# Patient Record
Sex: Male | Born: 1950 | Race: White | Hispanic: No | Smoking: Current every day smoker
Health system: Southern US, Community
[De-identification: ages and names within clinical notes are randomized; demographics above are authoritative.]

## PROBLEM LIST (undated history)

## (undated) DIAGNOSIS — E119 Type 2 diabetes mellitus without complications: Secondary | ICD-10-CM

## (undated) DIAGNOSIS — J449 Chronic obstructive pulmonary disease, unspecified: Secondary | ICD-10-CM

## (undated) DIAGNOSIS — Z9981 Dependence on supplemental oxygen: Secondary | ICD-10-CM

## (undated) DIAGNOSIS — I2510 Atherosclerotic heart disease of native coronary artery without angina pectoris: Secondary | ICD-10-CM

## (undated) DIAGNOSIS — I509 Heart failure, unspecified: Secondary | ICD-10-CM

## (undated) DIAGNOSIS — D751 Secondary polycythemia: Secondary | ICD-10-CM

## (undated) DIAGNOSIS — R600 Localized edema: Secondary | ICD-10-CM

## (undated) DIAGNOSIS — I10 Essential (primary) hypertension: Secondary | ICD-10-CM

## (undated) HISTORY — DX: Localized edema: R60.0

## (undated) HISTORY — DX: Essential (primary) hypertension: I10

## (undated) HISTORY — DX: Secondary polycythemia: D75.1

## (undated) HISTORY — DX: Atherosclerotic heart disease of native coronary artery without angina pectoris: I25.10

## (undated) HISTORY — DX: Chronic obstructive pulmonary disease, unspecified: J44.9

---

## 2012-07-09 ENCOUNTER — Encounter (HOSPITAL_COMMUNITY): Payer: Self-pay

## 2012-07-19 ENCOUNTER — Ambulatory Visit (HOSPITAL_COMMUNITY)
Admission: RE | Admit: 2012-07-19 | Discharge: 2012-07-19 | Disposition: A | Discharge status: Home / Self Care | Payer: Self-pay | Source: Ambulatory Visit | Attending: Family Medicine | Admitting: Family Medicine

## 2012-07-19 DIAGNOSIS — R079 Chest pain, unspecified: Secondary | ICD-10-CM | POA: Insufficient documentation

## 2014-08-03 ENCOUNTER — Inpatient Hospital Stay (HOSPITAL_COMMUNITY)
Admission: EM | Admit: 2014-08-03 | Discharge: 2014-08-07 | DRG: 190 | Disposition: A | Discharge status: Home / Self Care | Payer: Medicare Other | Attending: Internal Medicine | Admitting: Internal Medicine

## 2014-08-03 ENCOUNTER — Inpatient Hospital Stay (HOSPITAL_COMMUNITY): Payer: Medicare Other

## 2014-08-03 ENCOUNTER — Emergency Department (HOSPITAL_COMMUNITY): Payer: Medicare Other

## 2014-08-03 ENCOUNTER — Other Ambulatory Visit (HOSPITAL_COMMUNITY): Payer: Self-pay

## 2014-08-03 ENCOUNTER — Encounter (HOSPITAL_COMMUNITY): Payer: Self-pay | Admitting: *Deleted

## 2014-08-03 DIAGNOSIS — I5031 Acute diastolic (congestive) heart failure: Secondary | ICD-10-CM | POA: Diagnosis present

## 2014-08-03 DIAGNOSIS — J441 Chronic obstructive pulmonary disease with (acute) exacerbation: Secondary | ICD-10-CM | POA: Diagnosis present

## 2014-08-03 DIAGNOSIS — R0600 Dyspnea, unspecified: Secondary | ICD-10-CM | POA: Diagnosis present

## 2014-08-03 DIAGNOSIS — F17210 Nicotine dependence, cigarettes, uncomplicated: Secondary | ICD-10-CM | POA: Diagnosis present

## 2014-08-03 DIAGNOSIS — R600 Localized edema: Secondary | ICD-10-CM | POA: Diagnosis present

## 2014-08-03 DIAGNOSIS — R0902 Hypoxemia: Secondary | ICD-10-CM | POA: Diagnosis present

## 2014-08-03 DIAGNOSIS — Z880 Allergy status to penicillin: Secondary | ICD-10-CM | POA: Diagnosis not present

## 2014-08-03 DIAGNOSIS — J9601 Acute respiratory failure with hypoxia: Secondary | ICD-10-CM | POA: Diagnosis present

## 2014-08-03 DIAGNOSIS — I10 Essential (primary) hypertension: Secondary | ICD-10-CM | POA: Diagnosis present

## 2014-08-03 DIAGNOSIS — J449 Chronic obstructive pulmonary disease, unspecified: Secondary | ICD-10-CM | POA: Diagnosis present

## 2014-08-03 DIAGNOSIS — R778 Other specified abnormalities of plasma proteins: Secondary | ICD-10-CM | POA: Diagnosis present

## 2014-08-03 DIAGNOSIS — D751 Secondary polycythemia: Secondary | ICD-10-CM | POA: Diagnosis present

## 2014-08-03 DIAGNOSIS — I248 Other forms of acute ischemic heart disease: Secondary | ICD-10-CM | POA: Diagnosis present

## 2014-08-03 DIAGNOSIS — R7989 Other specified abnormal findings of blood chemistry: Secondary | ICD-10-CM | POA: Diagnosis present

## 2014-08-03 DIAGNOSIS — Z881 Allergy status to other antibiotic agents status: Secondary | ICD-10-CM

## 2014-08-03 DIAGNOSIS — R0602 Shortness of breath: Secondary | ICD-10-CM | POA: Diagnosis present

## 2014-08-03 DIAGNOSIS — J9620 Acute and chronic respiratory failure, unspecified whether with hypoxia or hypercapnia: Secondary | ICD-10-CM | POA: Diagnosis present

## 2014-08-03 LAB — BASIC METABOLIC PANEL
Anion gap: 11 (ref 5–15)
BUN: 13 mg/dL (ref 6–23)
CO2: 36 mmol/L — AB (ref 19–32)
Calcium: 9.1 mg/dL (ref 8.4–10.5)
Chloride: 93 mmol/L — ABNORMAL LOW (ref 96–112)
Creatinine, Ser: 1.07 mg/dL (ref 0.50–1.35)
GFR calc Af Amer: 82 mL/min — ABNORMAL LOW (ref >90)
GFR, EST NON AFRICAN AMERICAN: 71 mL/min — AB (ref >90)
GLUCOSE: 99 mg/dL (ref 70–99)
POTASSIUM: 4.3 mmol/L (ref 3.5–5.1)
SODIUM: 140 mmol/L (ref 135–145)

## 2014-08-03 LAB — BLOOD GAS, ARTERIAL
Acid-Base Excess: 11 mmol/L — ABNORMAL HIGH (ref 0.0–2.0)
BICARBONATE: 36.9 meq/L — AB (ref 20.0–24.0)
Drawn by: 331001
O2 Content: 4 L/min
O2 Saturation: 86 %
PATIENT TEMPERATURE: 37
PH ART: 7.352 (ref 7.350–7.450)
PO2 ART: 52.3 mmHg — AB (ref 80.0–100.0)
TCO2: 30.3 mmol/L (ref 0–100)
pCO2 arterial: 68.2 mmHg (ref 35.0–45.0)

## 2014-08-03 LAB — I-STAT CHEM 8, ED
BUN: 14 mg/dL (ref 6–23)
Calcium, Ion: 1.03 mmol/L — ABNORMAL LOW (ref 1.13–1.30)
Chloride: 91 mmol/L — ABNORMAL LOW (ref 96–112)
Creatinine, Ser: 1.1 mg/dL (ref 0.50–1.35)
GLUCOSE: 106 mg/dL — AB (ref 70–99)
HEMATOCRIT: 67 % — AB (ref 39.0–52.0)
HEMOGLOBIN: 22.8 g/dL — AB (ref 13.0–17.0)
POTASSIUM: 4.1 mmol/L (ref 3.5–5.1)
SODIUM: 139 mmol/L (ref 135–145)
TCO2: 34 mmol/L (ref 0–100)

## 2014-08-03 LAB — CBC WITH DIFFERENTIAL/PLATELET
BASOS ABS: 0.1 10*3/uL (ref 0.0–0.1)
BASOS PCT: 1 % (ref 0–1)
Band Neutrophils: 0 % (ref 0–10)
Blasts: 0 %
EOS ABS: 0.2 10*3/uL (ref 0.0–0.7)
EOS PCT: 2 % (ref 0–5)
HEMATOCRIT: 62.6 % — AB (ref 39.0–52.0)
HEMOGLOBIN: 20.5 g/dL — AB (ref 13.0–17.0)
Lymphocytes Relative: 29 % (ref 12–46)
Lymphs Abs: 2.3 10*3/uL (ref 0.7–4.0)
MCH: 30.7 pg (ref 26.0–34.0)
MCHC: 32.7 g/dL (ref 30.0–36.0)
MCV: 93.7 fL (ref 78.0–100.0)
METAMYELOCYTES PCT: 0 %
MONO ABS: 0.9 10*3/uL (ref 0.1–1.0)
MYELOCYTES: 0 %
Monocytes Relative: 12 % (ref 3–12)
Neutro Abs: 4.4 10*3/uL (ref 1.7–7.7)
Neutrophils Relative %: 56 % (ref 43–77)
Platelets: 135 10*3/uL — ABNORMAL LOW (ref 150–400)
Promyelocytes Absolute: 0 %
RBC: 6.68 MIL/uL — AB (ref 4.22–5.81)
RDW: 13.5 % (ref 11.5–15.5)
WBC: 7.9 10*3/uL (ref 4.0–10.5)
nRBC: 0 /100 WBC

## 2014-08-03 LAB — TSH: TSH: 1.098 u[IU]/mL (ref 0.350–4.500)

## 2014-08-03 LAB — HEPATIC FUNCTION PANEL
ALBUMIN: 4.1 g/dL (ref 3.5–5.2)
ALK PHOS: 115 U/L (ref 39–117)
ALT: 20 U/L (ref 0–53)
AST: 23 U/L (ref 0–37)
BILIRUBIN DIRECT: 0.2 mg/dL (ref 0.0–0.5)
BILIRUBIN TOTAL: 0.7 mg/dL (ref 0.3–1.2)
Indirect Bilirubin: 0.5 mg/dL (ref 0.3–0.9)
Total Protein: 7.4 g/dL (ref 6.0–8.3)

## 2014-08-03 LAB — MAGNESIUM: MAGNESIUM: 1.9 mg/dL (ref 1.5–2.5)

## 2014-08-03 LAB — RETICULOCYTES
RBC.: 6.61 MIL/uL — ABNORMAL HIGH (ref 4.22–5.81)
RETIC COUNT ABSOLUTE: 112.4 10*3/uL (ref 19.0–186.0)
Retic Ct Pct: 1.7 % (ref 0.4–3.1)

## 2014-08-03 LAB — BRAIN NATRIURETIC PEPTIDE: B NATRIURETIC PEPTIDE 5: 59 pg/mL (ref 0.0–100.0)

## 2014-08-03 LAB — TROPONIN I
TROPONIN I: 0.04 ng/mL — AB (ref <0.031)
Troponin I: 0.04 ng/mL — ABNORMAL HIGH (ref <0.031)

## 2014-08-03 LAB — PHOSPHORUS: Phosphorus: 3.1 mg/dL (ref 2.3–4.6)

## 2014-08-03 MED ORDER — ACETAMINOPHEN 325 MG PO TABS: 650.0000 mg | ORAL_TABLET | Freq: Four times a day (QID) | ORAL | Status: DC | PRN

## 2014-08-03 MED ORDER — ALBUTEROL SULFATE (2.5 MG/3ML) 0.083% IN NEBU
2.5000 mg | INHALATION_SOLUTION | Freq: Once | RESPIRATORY_TRACT | Status: AC
  Administered 2014-08-03: 2.5 mg via RESPIRATORY_TRACT
  Filled 2014-08-03: qty 3

## 2014-08-03 MED ORDER — ONDANSETRON HCL 4 MG PO TABS: 4.0000 mg | ORAL_TABLET | Freq: Four times a day (QID) | ORAL | Status: DC | PRN

## 2014-08-03 MED ORDER — HYDRALAZINE HCL 20 MG/ML IJ SOLN: 5.0000 mg | INTRAMUSCULAR | Status: DC | PRN

## 2014-08-03 MED ORDER — IPRATROPIUM-ALBUTEROL 0.5-2.5 (3) MG/3ML IN SOLN
3.0000 mL | RESPIRATORY_TRACT | Status: DC
  Administered 2014-08-03 – 2014-08-06 (×20): 3 mL via RESPIRATORY_TRACT
  Filled 2014-08-03 (×21): qty 3

## 2014-08-03 MED ORDER — IPRATROPIUM-ALBUTEROL 0.5-2.5 (3) MG/3ML IN SOLN
3.0000 mL | Freq: Once | RESPIRATORY_TRACT | Status: AC
  Administered 2014-08-03: 3 mL via RESPIRATORY_TRACT
  Filled 2014-08-03: qty 3

## 2014-08-03 MED ORDER — HEPARIN SODIUM (PORCINE) 5000 UNIT/ML IJ SOLN
5000.0000 [IU] | Freq: Three times a day (TID) | INTRAMUSCULAR | Status: DC
  Administered 2014-08-03 – 2014-08-07 (×12): 5000 [IU] via SUBCUTANEOUS
  Filled 2014-08-03 (×12): qty 1

## 2014-08-03 MED ORDER — LISINOPRIL 10 MG PO TABS
20.0000 mg | ORAL_TABLET | Freq: Every day | ORAL | Status: DC
  Administered 2014-08-04 – 2014-08-07 (×4): 20 mg via ORAL
  Filled 2014-08-03 (×4): qty 2

## 2014-08-03 MED ORDER — OXYCODONE HCL 5 MG PO TABS
5.0000 mg | ORAL_TABLET | ORAL | Status: DC | PRN
  Administered 2014-08-06: 5 mg via ORAL
  Filled 2014-08-03: qty 1

## 2014-08-03 MED ORDER — ACETAMINOPHEN 650 MG RE SUPP: 650.0000 mg | Freq: Four times a day (QID) | RECTAL | Status: DC | PRN

## 2014-08-03 MED ORDER — LISINOPRIL-HYDROCHLOROTHIAZIDE 20-12.5 MG PO TABS: 1.0000 | ORAL_TABLET | Freq: Every day | ORAL | Status: DC

## 2014-08-03 MED ORDER — FUROSEMIDE 10 MG/ML IJ SOLN
40.0000 mg | Freq: Two times a day (BID) | INTRAMUSCULAR | Status: DC
  Administered 2014-08-03 – 2014-08-04 (×3): 40 mg via INTRAVENOUS
  Filled 2014-08-03 (×3): qty 4

## 2014-08-03 MED ORDER — ONDANSETRON HCL 4 MG/2ML IJ SOLN: 4.0000 mg | Freq: Four times a day (QID) | INTRAMUSCULAR | Status: DC | PRN

## 2014-08-03 MED ORDER — HYDROCHLOROTHIAZIDE 12.5 MG PO CAPS
12.5000 mg | ORAL_CAPSULE | Freq: Every day | ORAL | Status: DC
  Administered 2014-08-04 – 2014-08-07 (×4): 12.5 mg via ORAL
  Filled 2014-08-03 (×5): qty 1

## 2014-08-03 MED ORDER — METHYLPREDNISOLONE SODIUM SUCC 125 MG IJ SOLR
60.0000 mg | Freq: Four times a day (QID) | INTRAMUSCULAR | Status: DC
  Administered 2014-08-03 – 2014-08-05 (×8): 60 mg via INTRAVENOUS
  Filled 2014-08-03 (×8): qty 2

## 2014-08-03 MED ORDER — SODIUM CHLORIDE 0.9 % IJ SOLN
3.0000 mL | Freq: Two times a day (BID) | INTRAMUSCULAR | Status: DC
  Administered 2014-08-03 – 2014-08-07 (×8): 3 mL via INTRAVENOUS

## 2014-08-03 NOTE — ED Notes (Signed)
RT called

## 2014-08-03 NOTE — ED Notes (Signed)
CRITICAL VALUE ALERT  Critical value received:  ABG  Date of notification:  08/03/14  Time of notification:  16:37  Critical value read back:Yes.    Nurse who received alert:  Catha Brow, RN  MD notified (1st page):  Dr. Marily Memos  Time of first page:  16:38  MD notified (2nd page):  Time of second page:  Responding MD:  Dr. Marily Memos  Time MD responded:  16:38

## 2014-08-03 NOTE — H&P (Signed)
[facility] History and Physical  Brian Parsons YQI:[id number] DOB: 1950/07/07 DOA: 08/03/2014  Referring physician: Dr Roderic Palau - [facility] PCP: No primary care provider on file.   Chief Complaint: SOB  HPI: Brian Parsons is a 64 y.o. male  SOB started 2 wks ago.  Went to PCP today and was told to go to the ED for evaluation due to concern for fluid in the lungs.  No home O2. Unable to sleep lying down over past 2 weeks. SOB is worse w/ exertion. Associated w/ productive cough, and wheezing.  Ran out of Lisinopril/HCTZ 6 wks ago. Restarted 10 days ago after changing PCPs. New Doctor is Dr. Marlon Pel.  Review of Systems:  Constitutional:  No weight loss, night sweats, Fevers, chills, fatigue.  HEENT:  No headaches, Difficulty swallowing,Tooth/dental problems,Sore throat,  No sneezing, itching, ear ache, nasal congestion, post nasal drip,  Cardio-vascular:  No chest pain, Orthopnea, PND, anasarca, dizziness, palpitations  GI:  No heartburn, indigestion, abdominal pain, nausea, vomiting, diarrhea, change in bowel habits, loss of appetite  Resp: Per HPI Skin:  no rash or lesions.  GU:  no dysuria, change in color of urine, no urgency or frequency. No flank pain.  Musculoskeletal:   No joint pain or swelling. No decreased range of motion. No back pain.  Psych:  No change in mood or affect. No depression or anxiety. No memory loss.   Past Medical History  Diagnosis Date  . Hypertension    History reviewed. No pertinent past surgical history. Social History:  reports that he has been smoking Cigarettes.  He has a 50 pack-year smoking history. He does not have any smokeless tobacco history on file. He reports that he does not drink alcohol or use illicit drugs.  Allergies  Allergen Reactions  . Amoxicillin   . Penicillins     Family History  Problem Relation Age of Onset  . Stroke Mother      Prior to Admission medications   Not on File   Physical Exam: Filed Vitals:     08/03/14 1430 08/03/14 1500 08/03/14 1530 08/03/14 1545  BP: 132/89 145/89 150/109 138/102  Pulse: 84 83 84 93  Temp:      TempSrc:      Resp: 27 28    Height:      Weight:      SpO2: 93% 90% 93% 86%    Wt Readings from Last 3 Encounters:  08/03/14 106.595 kg (235 lb)    General:  Appears calm and comfortable Eyes:  PERRL, normal lids, injected conjunctiva bilat ENT:  grossly normal hearing, lips & tongue Neck:  No JVD, FROM Cardiovascular:  RRR, no m/r/g. 2+ pitting edema Respiratory: Diffuse wheezes and crackles with diminished sounds in the bases. Mildly increased work of breathing. On 2 L nasal cannula. Abdomen:  soft, ntnd, obese Skin:  no rash or induration seen on limited exam Musculoskeletal:  grossly normal tone BUE/BLE Psychiatric:  grossly normal mood and affect, speech fluent and appropriate Neurologic:  grossly non-focal.          Labs on Admission:  Basic Metabolic Panel:  Recent Labs Lab 08/03/14 1300 08/03/14 1552  NA 140 139  K 4.3 4.1  CL 93* 91*  CO2 36*  --   GLUCOSE 99 106*  BUN 13 14  CREATININE 1.07 1.10  CALCIUM 9.1  --    Liver Function Tests:  Recent Labs Lab 08/03/14 1300  AST 23  ALT 20  ALKPHOS 115  BILITOT 0.7  PROT 7.4  ALBUMIN 4.1   No results for input(s): LIPASE, AMYLASE in the last 168 hours. No results for input(s): AMMONIA in the last 168 hours. CBC:  Recent Labs Lab 08/03/14 1300 08/03/14 1552  WBC 7.9  --   NEUTROABS 4.4  --   HGB 20.5* 22.8*  HCT 62.6* 67.0*  MCV 93.7  --   PLT 135*  --    Cardiac Enzymes:  Recent Labs Lab 08/03/14 1300  TROPONINI 0.04*    BNP (last 3 results)  Recent Labs  08/03/14 1300  BNP 59.0    ProBNP (last 3 results) No results for input(s): PROBNP in the last 8760 hours.  CBG: No results for input(s): GLUCAP in the last 168 hours.  Radiological Exams on Admission: Dg Chest Portable 1 View  08/03/2014   CLINICAL DATA:  64 year old male with shortness of  breath and lower extremity edema for the past 2 weeks.  EXAM: PORTABLE CHEST - 1 VIEW  COMPARISON:  None.  FINDINGS: Mild cardiomegaly. There is pulmonary vascular congestion with cephalization. No overt pulmonary edema. Nonspecific bibasilar opacities. No pneumothorax. No acute osseous abnormality.  IMPRESSION: 1. Nonspecific bibasilar opacities greater on the right than the left. Differential considerations include dependent interstitial edema, atelectasis and infiltrate. Recommend dedicated PA and lateral chest x-ray when the patient is able. 2. Cardiomegaly and pulmonary vascular congestion without overt edema.   Electronically Signed   By: Jacqulynn Cadet M.D.   On: 08/03/2014 13:34    EKG: Independently reviewed. Sinus, PACs, no sign of ACS.  Assessment/Plan Principal Problem:   Hypoxia Active Problems:   COPD (chronic obstructive pulmonary disease)   Lower extremity edema   Elevated troponin   Polycythemia   Essential hypertension   Dyspnea   SHortness of breath: Likely multifactorial, associated with COPD exacerbation and heart failure. Patient with no formal COPD diagnoses but has a 50+ pack year smoking history, and physical exam is consistent with this. BNP only 59 but elevated troponin, orthopnea, and diffuse edema concerning for CHF. A likely infectious given lack of white count and chronicity of symptoms and unlikely PE given lack of chest pain and bilateral edema. - Tele - Solu-Medrol 60 mg every 6 hours - Duonebs every 4 hours - O2 when necessary - Formal PFTs after discharge - Echo with contrast - Lasix 40 mg IV - Strict I's and O's - Daily weights - 2 view CXR - Mag, TSH - ABG  Hypertension: Hypertensive on presentation. This may be due to patient's acute illness. - Continue HCTZ and lisinopril - Hydralazine when necessary SBP> 180 and the DBP > 100  Elevated troponin: 0.04 and admission likely secondary to demand. EKG without direct evidence of ischemia -  Telemetry - Cycle troponins - EKG in a.m.  Polycythemia: Hgb 20.5 HCT 62.6. Repeat labs confirmed elevation. Diff unremarkable. WBC 7.9. Likely from chronic hypoxemia vs malignancy - Peripheral smear - Epo - Retic - Consider H/O consult   Code Status: FULL DVT Prophylaxis: Hep Family Communication: Son Disposition Plan: pending improvement  Ayelet Gruenewald Lenna Sciara, MD Family Medicine [facility] [email or web address] Password [email or web address]

## 2014-08-03 NOTE — ED Notes (Signed)
Patient transferred off of the floor and will go by xray before going to his room.

## 2014-08-03 NOTE — ED Notes (Signed)
Sent from [facility]  For "retaining fld"  abd swollen  And swelling of lower ext.   No NVD,  Has had cough with green sputum.

## 2014-08-03 NOTE — ED Notes (Signed)
Pt's admission orders were released by hospitalist, xray here to get pt. For a repeat xray and RT here for ABG. Floor notified on pt. Delay.

## 2014-08-03 NOTE — ED Provider Notes (Signed)
CSN: [id number]     Arrival date & time 08/03/14  1227 History   First MD Initiated Contact with Patient 08/03/14 1305     Chief Complaint  Patient presents with  . Shortness of Breath     (Consider location/radiation/quality/duration/timing/severity/associated sxs/prior Treatment) Patient is a 64 y.o. male presenting with shortness of breath. The history is provided by the patient (the pt complains of sob).  Shortness of Breath Severity:  Moderate Onset quality:  Gradual Timing:  Constant Progression:  Worsening Chronicity:  New Context: activity   Relieved by:  Nothing Associated symptoms: no abdominal pain, no chest pain, no cough, no headaches and no rash     Past Medical History  Diagnosis Date  . Hypertension    History reviewed. No pertinent past surgical history. History reviewed. No pertinent family history. History  Substance Use Topics  . Smoking status: Current Every Day Smoker  . Smokeless tobacco: Not on file  . Alcohol Use: No     Comment: no etoh for 7 years    Review of Systems  Constitutional: Negative for appetite change and fatigue.  HENT: Negative for congestion, ear discharge and sinus pressure.   Eyes: Negative for discharge.  Respiratory: Positive for shortness of breath. Negative for cough.   Cardiovascular: Negative for chest pain.  Gastrointestinal: Negative for abdominal pain and diarrhea.  Genitourinary: Negative for frequency and hematuria.  Musculoskeletal: Negative for back pain.  Skin: Negative for rash.  Neurological: Negative for seizures and headaches.  Psychiatric/Behavioral: Negative for hallucinations.      Allergies  Amoxicillin and Penicillins  Home Medications   Prior to Admission medications   Not on File   BP 145/89 mmHg  Pulse 83  Temp(Src) 98.8 F (37.1 C) (Oral)  Resp 28  Ht 5\' 8"  (1.727 m)  Wt 235 lb (106.595 kg)  BMI 35.74 kg/m2  SpO2 90% Physical Exam  Constitutional: He is oriented to person,  place, and time. He appears well-developed.  HENT:  Head: Normocephalic.  Eyes: Conjunctivae and EOM are normal. No scleral icterus.  Neck: Neck supple. No thyromegaly present.  Cardiovascular: Normal rate and regular rhythm.  Exam reveals no gallop and no friction rub.   No murmur heard. Pulmonary/Chest: No stridor. He has wheezes. He has no rales. He exhibits no tenderness.  Abdominal: He exhibits no distension. There is no tenderness. There is no rebound.  Musculoskeletal: Normal range of motion. He exhibits edema.  Lymphadenopathy:    He has no cervical adenopathy.  Neurological: He is oriented to person, place, and time. He exhibits normal muscle tone. Coordination normal.  Skin: No rash noted. No erythema.  Psychiatric: He has a normal mood and affect. His behavior is normal.    ED Course  Procedures (including critical care time) Labs Review Labs Reviewed  CBC WITH DIFFERENTIAL/PLATELET - Abnormal; Notable for the following:    RBC 6.68 (*)    Hemoglobin 20.5 (*)    HCT 62.6 (*)    Platelets 135 (*)    All other components within normal limits  BASIC METABOLIC PANEL - Abnormal; Notable for the following:    Chloride 93 (*)    CO2 36 (*)    GFR calc non Af Amer 71 (*)    GFR calc Af Amer 82 (*)    All other components within normal limits  TROPONIN I - Abnormal; Notable for the following:    Troponin I 0.04 (*)    All other components within normal limits  HEPATIC FUNCTION PANEL  BRAIN NATRIURETIC PEPTIDE    Imaging Review Dg Chest Portable 1 View  08/03/2014   CLINICAL DATA:  64 year old male with shortness of breath and lower extremity edema for the past 2 weeks.  EXAM: PORTABLE CHEST - 1 VIEW  COMPARISON:  None.  FINDINGS: Mild cardiomegaly. There is pulmonary vascular congestion with cephalization. No overt pulmonary edema. Nonspecific bibasilar opacities. No pneumothorax. No acute osseous abnormality.  IMPRESSION: 1. Nonspecific bibasilar opacities greater on the  right than the left. Differential considerations include dependent interstitial edema, atelectasis and infiltrate. Recommend dedicated PA and lateral chest x-ray when the patient is able. 2. Cardiomegaly and pulmonary vascular congestion without overt edema.   Electronically Signed   By: Jacqulynn Cadet M.D.   On: 08/03/2014 13:34     EKG Interpretation None      MDM   Final diagnoses:  COPD exacerbation    Admit for copd+    Milton Ferguson, MD 08/03/14 1529

## 2014-08-04 DIAGNOSIS — J9620 Acute and chronic respiratory failure, unspecified whether with hypoxia or hypercapnia: Secondary | ICD-10-CM | POA: Diagnosis present

## 2014-08-04 DIAGNOSIS — I5031 Acute diastolic (congestive) heart failure: Secondary | ICD-10-CM | POA: Diagnosis present

## 2014-08-04 DIAGNOSIS — J441 Chronic obstructive pulmonary disease with (acute) exacerbation: Principal | ICD-10-CM

## 2014-08-04 DIAGNOSIS — R0600 Dyspnea, unspecified: Secondary | ICD-10-CM

## 2014-08-04 LAB — CBC
HCT: 61.2 % — ABNORMAL HIGH (ref 39.0–52.0)
HEMOGLOBIN: 19.6 g/dL — AB (ref 13.0–17.0)
MCH: 30.3 pg (ref 26.0–34.0)
MCHC: 32 g/dL (ref 30.0–36.0)
MCV: 94.7 fL (ref 78.0–100.0)
Platelets: 137 10*3/uL — ABNORMAL LOW (ref 150–400)
RBC: 6.46 MIL/uL — AB (ref 4.22–5.81)
RDW: 13.6 % (ref 11.5–15.5)
WBC: 5.7 10*3/uL (ref 4.0–10.5)

## 2014-08-04 LAB — COMPREHENSIVE METABOLIC PANEL
ALK PHOS: 102 U/L (ref 39–117)
ALT: 20 U/L (ref 0–53)
ANION GAP: 10 (ref 5–15)
AST: 19 U/L (ref 0–37)
Albumin: 3.9 g/dL (ref 3.5–5.2)
BILIRUBIN TOTAL: 0.9 mg/dL (ref 0.3–1.2)
BUN: 20 mg/dL (ref 6–23)
CHLORIDE: 92 mmol/L — AB (ref 96–112)
CO2: 37 mmol/L — AB (ref 19–32)
Calcium: 8.8 mg/dL (ref 8.4–10.5)
Creatinine, Ser: 1.16 mg/dL (ref 0.50–1.35)
GFR calc Af Amer: 75 mL/min — ABNORMAL LOW (ref >90)
GFR, EST NON AFRICAN AMERICAN: 64 mL/min — AB (ref >90)
GLUCOSE: 138 mg/dL — AB (ref 70–99)
POTASSIUM: 4.8 mmol/L (ref 3.5–5.1)
SODIUM: 139 mmol/L (ref 135–145)
Total Protein: 7.3 g/dL (ref 6.0–8.3)

## 2014-08-04 LAB — TROPONIN I

## 2014-08-04 LAB — ERYTHROPOIETIN: ERYTHROPOIETIN: 35.6 m[IU]/mL — AB (ref 2.6–18.5)

## 2014-08-04 MED ORDER — LEVOFLOXACIN 500 MG PO TABS
500.0000 mg | ORAL_TABLET | ORAL | Status: DC
  Administered 2014-08-04 – 2014-08-06 (×3): 500 mg via ORAL
  Filled 2014-08-04 (×3): qty 1

## 2014-08-04 MED ORDER — NICOTINE 21 MG/24HR TD PT24
21.0000 mg | MEDICATED_PATCH | Freq: Every day | TRANSDERMAL | Status: DC
  Administered 2014-08-04 – 2014-08-07 (×4): 21 mg via TRANSDERMAL
  Filled 2014-08-04 (×4): qty 1

## 2014-08-04 MED ORDER — ALBUTEROL SULFATE (2.5 MG/3ML) 0.083% IN NEBU: 2.5000 mg | INHALATION_SOLUTION | RESPIRATORY_TRACT | Status: DC | PRN

## 2014-08-04 NOTE — Progress Notes (Signed)
  Echocardiogram 2D Echocardiogram has been performed.  Brian Parsons 08/04/2014, 9:28 AM

## 2014-08-04 NOTE — Progress Notes (Signed)
UR chart review completed.  

## 2014-08-04 NOTE — Progress Notes (Signed)
[facility] PROGRESS NOTE  Brian Parsons NKN:[id number] DOB: [birth year] DOA: 08/03/2014 PCP: No primary care provider on file.  Assessment/Plan: 1. Acute respiratory failure. Felt to be multifactorial related to COPD as well as CHF. Continue to wean down oxygen as tolerated. 2. COPD exacerbation. Patient has a long history of tobacco use. He will likely need PFTs as an outpatient. Continue on steroids, bronchodilators and antibiotics. 3. Acute diastolic congestive heart failure. Improving with intravenous Lasix. Patient reports his lower extremity edema is improving as is his shortness of breath. His volume status is -2.34 L. Continue current treatments. Creatinine is stable 4. Polycythemia. Likely secondary to chronic hypoxia from tobacco abuse. Can consider outpatient hematology referral. 5. Elevated troponin. Likely demand ischemia. He is not having any chest pain or EKG changes. 6. Tobacco use. Counseled on the importance of tobacco cessation. Provide nicotine patch.  Code Status: Full code Family Communication: Discussed with family at the bedside Disposition Plan: Discharge home once improved   Consultants:    Procedures:  Echo:- Left ventricle: The cavity size was normal. Wall thickness was increased in a pattern of mild LVH. Systolic function was normal. The estimated ejection fraction was in the range of 60% to 65%. Wall motion was normal; there were no regional wall motion abnormalities. Left ventricular diastolic function parameters were normal. - Aortic valve: Mildly calcified annulus. Mildly thickened leaflets. Valve area (VTI): 3.42 cm^2. Valve area (Vmax): 3.39 cm^2. - Right ventricle: The cavity size was moderately dilated. The RV shares the apex with the LV. - Right atrium: The atrium was mildly dilated. - Atrial septum: No defect or patent foramen ovale was identified. - Systemic veins: IVC is dilated with normal respiratory  variation, estimated RA pressure is 8 mmHg.  Antibiotics:  Levaquin 4/19>>  HPI/Subjective: Feeling better, still short of breath, LE edema improving  Objective: Filed Vitals:   08/04/14 0625  BP: 131/86  Pulse: 81  Temp: 98 F (36.7 C)  Resp:     Intake/Output Summary (Last 24 hours) at 08/04/14 1103 Last data filed at 08/04/14 0629  Gross per 24 hour  Intake    360 ml  Output   1850 ml  Net  -1490 ml   Filed Weights   08/03/14 1240 08/03/14 1654 08/04/14 0625  Weight: 106.595 kg (235 lb) 106.68 kg (235 lb 3 oz) 103.828 kg (228 lb 14.4 oz)    Exam:   General:  NAD  Cardiovascular: s1, s2, rrr  Respiratory: bilateral wheezing, diminished breath sounds  Abdomen: soft, obese, nt, bs+  Musculoskeletal: 1+ edema b/l   Data Reviewed: Basic Metabolic Panel:  Recent Labs Lab 08/03/14 1300 08/03/14 1552 08/03/14 1611 08/04/14 0608  NA 140 139  --  139  K 4.3 4.1  --  4.8  CL 93* 91*  --  92*  CO2 36*  --   --  37*  GLUCOSE 99 106*  --  138*  BUN 13 14  --  20  CREATININE 1.07 1.10  --  1.16  CALCIUM 9.1  --   --  8.8  MG  --   --  1.9  --   PHOS  --   --  3.1  --    Liver Function Tests:  Recent Labs Lab 08/03/14 1300 08/04/14 0608  AST 23 19  ALT 20 20  ALKPHOS 115 102  BILITOT 0.7 0.9  PROT 7.4 7.3  ALBUMIN 4.1 3.9   No results for input(s): LIPASE, AMYLASE in the last 168  hours. No results for input(s): AMMONIA in the last 168 hours. CBC:  Recent Labs Lab 08/03/14 1300 08/03/14 1552 08/04/14 0608  WBC 7.9  --  5.7  NEUTROABS 4.4  --   --   HGB 20.5* 22.8* 19.6*  HCT 62.6* 67.0* 61.2*  MCV 93.7  --  94.7  PLT 135*  --  137*   Cardiac Enzymes:  Recent Labs Lab 08/03/14 1300 08/03/14 1801 08/04/14 0020  TROPONINI 0.04* 0.04* <0.03   BNP (last 3 results)  Recent Labs  08/03/14 1300  BNP 59.0    ProBNP (last 3 results) No results for input(s): PROBNP in the last 8760 hours.  CBG: No results for input(s): GLUCAP  in the last 168 hours.  No results found for this or any previous visit (from the past 240 hour(s)).   Studies: Dg Chest 2 View  08/03/2014   CLINICAL DATA:  Cough.  Retaining fluid.  EXAM: CHEST  2 VIEW  COMPARISON:  08/03/2014 1300 hours  FINDINGS: Lungs are better aerated. Bibasilar atelectasis has improved. Heart remains borderline enlarged. Normal vascularity. No pneumothorax. Upper lumbar compression deformity is present with 50% loss of height anteriorly.  IMPRESSION: Improved bibasilar atelectasis.  Upper lumbar compression fracture.   Electronically Signed   By: Marybelle Killings M.D.   On: 08/03/2014 16:48   Dg Chest Portable 1 View  08/03/2014   CLINICAL DATA:  64 year old male with shortness of breath and lower extremity edema for the past 2 weeks.  EXAM: PORTABLE CHEST - 1 VIEW  COMPARISON:  None.  FINDINGS: Mild cardiomegaly. There is pulmonary vascular congestion with cephalization. No overt pulmonary edema. Nonspecific bibasilar opacities. No pneumothorax. No acute osseous abnormality.  IMPRESSION: 1. Nonspecific bibasilar opacities greater on the right than the left. Differential considerations include dependent interstitial edema, atelectasis and infiltrate. Recommend dedicated PA and lateral chest x-ray when the patient is able. 2. Cardiomegaly and pulmonary vascular congestion without overt edema.   Electronically Signed   By: Jacqulynn Cadet M.D.   On: 08/03/2014 13:34    Scheduled Meds: . furosemide  40 mg Intravenous BID  . heparin  5,000 Units Subcutaneous 3 times per day  . lisinopril  20 mg Oral Daily   And  . hydrochlorothiazide  12.5 mg Oral Daily  . ipratropium-albuterol  3 mL Nebulization Q4H  . methylPREDNISolone (SOLU-MEDROL) injection  60 mg Intravenous Q6H  . sodium chloride  3 mL Intravenous Q12H   Continuous Infusions:   Principal Problem:   Hypoxia Active Problems:   COPD (chronic obstructive pulmonary disease)   Lower extremity edema   Elevated  troponin   Polycythemia   Essential hypertension   Dyspnea    Time spent: 22mins    MEMON,JEHANZEB  [facility] Pager [phone]. If 7PM-7AM, please contact night-coverage at [email or web address], password [email or web address] 08/04/2014, 11:03 AM  LOS: 1 day

## 2014-08-04 NOTE — Care Management Note (Addendum)
    Page 1 of 2   08/07/2014     9:58:01 AM CARE MANAGEMENT NOTE 08/07/2014  Patient:  Brian Parsons, Brian Parsons   Account Number:  [id number]  Date Initiated:  08/04/2014  Documentation initiated by:  Theophilus Kinds  Subjective/Objective Assessment:   Pt admitted from home with COPD exacerbation. Pt lives with his significant other and will return home at discharge. Pt is independent with ADL's. Pt does have neb machine for home use.     Action/Plan:   Will need to assess need for home O2 prior to discharge.   Anticipated DC Date:  08/06/2014   Anticipated DC Plan:  Brian Parsons  CM consult      [facility] Choice  HOME HEALTH  DURABLE MEDICAL EQUIPMENT   Choice offered to / List presented to:  C-1 Patient   DME arranged  [facility]      DME agency  [place] arranged  HH-1 RN  [place].   Status of service:  Completed, signed off Medicare Important Message given?  YES (If response is "NO", the following Medicare IM given date fields will be blank) Date Medicare IM given:  08/07/2014 Medicare IM given by:  Theophilus Kinds Date Additional Medicare IM given:   Additional Medicare IM given by:    Discharge Disposition:  [place]  Per UR Regulation:    If discussed at Long Length of Stay Meetings, dates discussed:    Comments:  08/07/14 [place], RN BSN CM Pt discharged home today with [facility] RN (per pts choice). Brian Parsons of [facility] is aware and will collect the pts information from the chart. [place] services to start within 48 hours of discharge. Pt qualifies for home O2 and would like to get from [facility] as well. Portable to be delivered to pts room prior to discharge and other DME will be delivered to pts home at discharge. Pt and pts nurse aware of discharge arrangements.  08/04/14 [place], Therapist, sports BSN CM

## 2014-08-05 ENCOUNTER — Inpatient Hospital Stay (HOSPITAL_COMMUNITY): Payer: Medicare Other

## 2014-08-05 DIAGNOSIS — J9601 Acute respiratory failure with hypoxia: Secondary | ICD-10-CM

## 2014-08-05 LAB — BASIC METABOLIC PANEL
Anion gap: 10 (ref 5–15)
BUN: 31 mg/dL — ABNORMAL HIGH (ref 6–23)
CALCIUM: 8.5 mg/dL (ref 8.4–10.5)
CHLORIDE: 89 mmol/L — AB (ref 96–112)
CO2: 37 mmol/L — ABNORMAL HIGH (ref 19–32)
Creatinine, Ser: 1.19 mg/dL (ref 0.50–1.35)
GFR, EST AFRICAN AMERICAN: 72 mL/min — AB (ref >90)
GFR, EST NON AFRICAN AMERICAN: 62 mL/min — AB (ref >90)
GLUCOSE: 140 mg/dL — AB (ref 70–99)
Potassium: 4.7 mmol/L (ref 3.5–5.1)
Sodium: 136 mmol/L (ref 135–145)

## 2014-08-05 LAB — CBC
HCT: 60.2 % — ABNORMAL HIGH (ref 39.0–52.0)
Hemoglobin: 18.9 g/dL — ABNORMAL HIGH (ref 13.0–17.0)
MCH: 29.3 pg (ref 26.0–34.0)
MCHC: 31.4 g/dL (ref 30.0–36.0)
MCV: 93.2 fL (ref 78.0–100.0)
Platelets: 165 K/uL (ref 150–400)
RBC: 6.46 MIL/uL — ABNORMAL HIGH (ref 4.22–5.81)
RDW: 14.8 % (ref 11.5–15.5)
WBC: 11.9 K/uL — ABNORMAL HIGH (ref 4.0–10.5)

## 2014-08-05 MED ORDER — ALPRAZOLAM 0.25 MG PO TABS
0.2500 mg | ORAL_TABLET | Freq: Three times a day (TID) | ORAL | Status: DC | PRN
  Administered 2014-08-05 – 2014-08-07 (×3): 0.25 mg via ORAL
  Filled 2014-08-05 (×3): qty 1

## 2014-08-05 MED ORDER — GUAIFENESIN ER 600 MG PO TB12
1200.0000 mg | ORAL_TABLET | Freq: Two times a day (BID) | ORAL | Status: DC
Start: 2014-08-05 — End: 2014-08-07
  Administered 2014-08-05 – 2014-08-07 (×5): 1200 mg via ORAL
  Filled 2014-08-05 (×5): qty 2

## 2014-08-05 MED ORDER — IOHEXOL 350 MG/ML SOLN
100.0000 mL | Freq: Once | INTRAVENOUS | Status: AC | PRN
  Administered 2014-08-05: 100 mL via INTRAVENOUS

## 2014-08-05 MED ORDER — METHYLPREDNISOLONE SODIUM SUCC 125 MG IJ SOLR
80.0000 mg | Freq: Four times a day (QID) | INTRAMUSCULAR | Status: DC
  Administered 2014-08-05 – 2014-08-07 (×8): 80 mg via INTRAVENOUS
  Filled 2014-08-05 (×7): qty 2

## 2014-08-05 MED ORDER — FUROSEMIDE 20 MG PO TABS
20.0000 mg | ORAL_TABLET | Freq: Every day | ORAL | Status: DC
  Administered 2014-08-06 – 2014-08-07 (×2): 20 mg via ORAL
  Filled 2014-08-05 (×2): qty 1

## 2014-08-05 NOTE — Progress Notes (Signed)
[facility] PROGRESS NOTE  Adrienne Delay CHY:[id number] DOB: Jan 18, 1951 DOA: 08/03/2014 PCP: No primary care provider on file.  Assessment/Plan: 1. Acute respiratory failure. Felt to be multifactorial related to COPD as well as CHF. Patient has an increasing oxygen requirement overnight. CT chest was done today which did not show any PE. Will continue current treatments 2. COPD exacerbation. Patient has a long history of tobacco use. He will likely need PFTs as an outpatient. He is still wheezing and hypoxic. Will continue nebs and abx. Increase steroids.  3. Acute diastolic congestive heart failure. Improving with intravenous Lasix. Patient reports his lower extremity edema is improving as is his shortness of breath. His volume status is -2.7 L. Appears to be approaching euvolemia. Will transition to oral lasix 4. Polycythemia. Likely secondary to chronic hypoxia from tobacco abuse. Can consider outpatient hematology referral. 5. Elevated troponin. Likely demand ischemia. He is not having any chest pain or EKG changes. 6. Tobacco use. Counseled on the importance of tobacco cessation. Provide nicotine patch.  Code Status: Full code Family Communication: Discussed with family at the bedside Disposition Plan: Discharge home once improved   Consultants:    Procedures:  Echo:- Left ventricle: The cavity size was normal. Wall thickness was increased in a pattern of mild LVH. Systolic function was normal. The estimated ejection fraction was in the range of 60% to 65%. Wall motion was normal; there were no regional wall motion abnormalities. Left ventricular diastolic function parameters were normal. - Aortic valve: Mildly calcified annulus. Mildly thickened leaflets. Valve area (VTI): 3.42 cm^2. Valve area (Vmax): 3.39 cm^2. - Right ventricle: The cavity size was moderately dilated. The RV shares the apex with the LV. - Right atrium: The atrium was mildly dilated. -  Atrial septum: No defect or patent foramen ovale was identified. - Systemic veins: IVC is dilated with normal respiratory variation, estimated RA pressure is 8 mmHg.  Antibiotics:  Levaquin 4/19>>  HPI/Subjective: Although patient is saying that he is feeling better, he has had an increasing oxygen requirement overnight. Now on 4.5L. Reports minimal cough that is non productive. LE edema has improved.  Objective: Filed Vitals:   08/05/14 0550  BP: 114/71  Pulse: 99  Temp: 98.4 F (36.9 C)  Resp: 20    Intake/Output Summary (Last 24 hours) at 08/05/14 0910 Last data filed at 08/05/14 0549  Gross per 24 hour  Intake    720 ml  Output   1800 ml  Net  -1080 ml   Filed Weights   08/03/14 1654 08/04/14 0625 08/05/14 0550  Weight: 106.68 kg (235 lb 3 oz) 103.828 kg (228 lb 14.4 oz) 104.4 kg (230 lb 2.6 oz)    Exam:   General:  NAD  Cardiovascular: s1, s2, rrr  Respiratory: bilateral wheezing improving, diminished breath sounds  Abdomen: soft, obese, nt, bs+  Musculoskeletal: no edema b/l   Data Reviewed: Basic Metabolic Panel:  Recent Labs Lab 08/03/14 1300 08/03/14 1552 08/03/14 1611 08/04/14 0608 08/05/14 0609  NA 140 139  --  139 136  K 4.3 4.1  --  4.8 4.7  CL 93* 91*  --  92* 89*  CO2 36*  --   --  37* 37*  GLUCOSE 99 106*  --  138* 140*  BUN 13 14  --  20 31*  CREATININE 1.07 1.10  --  1.16 1.19  CALCIUM 9.1  --   --  8.8 8.5  MG  --   --  1.9  --   --  PHOS  --   --  3.1  --   --    Liver Function Tests:  Recent Labs Lab 08/03/14 1300 08/04/14 0608  AST 23 19  ALT 20 20  ALKPHOS 115 102  BILITOT 0.7 0.9  PROT 7.4 7.3  ALBUMIN 4.1 3.9   No results for input(s): LIPASE, AMYLASE in the last 168 hours. No results for input(s): AMMONIA in the last 168 hours. CBC:  Recent Labs Lab 08/03/14 1300 08/03/14 1552 08/04/14 0608 08/05/14 0609  WBC 7.9  --  5.7 11.9*  NEUTROABS 4.4  --   --   --   HGB 20.5* 22.8* 19.6* 18.9*  HCT 62.6*  67.0* 61.2* 60.2*  MCV 93.7  --  94.7 93.2  PLT 135*  --  137* 165   Cardiac Enzymes:  Recent Labs Lab 08/03/14 1300 08/03/14 1801 08/04/14 0020  TROPONINI 0.04* 0.04* <0.03   BNP (last 3 results)  Recent Labs  08/03/14 1300  BNP 59.0    ProBNP (last 3 results) No results for input(s): PROBNP in the last 8760 hours.  CBG: No results for input(s): GLUCAP in the last 168 hours.  No results found for this or any previous visit (from the past 240 hour(s)).   Studies: Dg Chest 2 View  08/03/2014   CLINICAL DATA:  Cough.  Retaining fluid.  EXAM: CHEST  2 VIEW  COMPARISON:  08/03/2014 1300 hours  FINDINGS: Lungs are better aerated. Bibasilar atelectasis has improved. Heart remains borderline enlarged. Normal vascularity. No pneumothorax. Upper lumbar compression deformity is present with 50% loss of height anteriorly.  IMPRESSION: Improved bibasilar atelectasis.  Upper lumbar compression fracture.   Electronically Signed   By: Marybelle Killings M.D.   On: 08/03/2014 16:48   Dg Chest Portable 1 View  08/03/2014   CLINICAL DATA:  64 year old male with shortness of breath and lower extremity edema for the past 2 weeks.  EXAM: PORTABLE CHEST - 1 VIEW  COMPARISON:  None.  FINDINGS: Mild cardiomegaly. There is pulmonary vascular congestion with cephalization. No overt pulmonary edema. Nonspecific bibasilar opacities. No pneumothorax. No acute osseous abnormality.  IMPRESSION: 1. Nonspecific bibasilar opacities greater on the right than the left. Differential considerations include dependent interstitial edema, atelectasis and infiltrate. Recommend dedicated PA and lateral chest x-ray when the patient is able. 2. Cardiomegaly and pulmonary vascular congestion without overt edema.   Electronically Signed   By: Jacqulynn Cadet M.D.   On: 08/03/2014 13:34    Scheduled Meds: . [START ON 08/06/2014] furosemide  20 mg Oral Daily  . heparin  5,000 Units Subcutaneous 3 times per day  . lisinopril  20  mg Oral Daily   And  . hydrochlorothiazide  12.5 mg Oral Daily  . ipratropium-albuterol  3 mL Nebulization Q4H  . levofloxacin  500 mg Oral Q24H  . methylPREDNISolone (SOLU-MEDROL) injection  60 mg Intravenous Q6H  . nicotine  21 mg Transdermal Daily  . sodium chloride  3 mL Intravenous Q12H   Continuous Infusions:   Principal Problem:   Hypoxia Active Problems:   COPD (chronic obstructive pulmonary disease)   Lower extremity edema   Elevated troponin   Polycythemia   Essential hypertension   Dyspnea   Acute respiratory failure with hypoxia   Acute diastolic congestive heart failure    Time spent: 21mins    Elspeth Blucher  [facility] Pager [phone]. If 7PM-7AM, please contact night-coverage at [email or web address], password [facility] 08/05/2014, 9:10 AM  LOS: 2 days

## 2014-08-05 NOTE — Progress Notes (Signed)
Spoke with patient and educated him on the importance of smoking cessation. Patient was provided a handout and I reviewed it with the patient at bedside. Encouraged patient to ask questions and patient stated he had no questions at this time. Will continue to encourage patient on smoking cessation at this time.

## 2014-08-05 NOTE — Progress Notes (Signed)
RT instructed patient on use of flutter , Pt tolerated well. RT will continue to monitor no complications noted

## 2014-08-06 LAB — BASIC METABOLIC PANEL
Anion gap: 7 (ref 5–15)
BUN: 37 mg/dL — AB (ref 6–23)
CALCIUM: 8.4 mg/dL (ref 8.4–10.5)
CHLORIDE: 90 mmol/L — AB (ref 96–112)
CO2: 39 mmol/L — AB (ref 19–32)
Creatinine, Ser: 1.25 mg/dL (ref 0.50–1.35)
GFR calc Af Amer: 68 mL/min — ABNORMAL LOW (ref >90)
GFR calc non Af Amer: 59 mL/min — ABNORMAL LOW (ref >90)
Glucose, Bld: 136 mg/dL — ABNORMAL HIGH (ref 70–99)
POTASSIUM: 4.8 mmol/L (ref 3.5–5.1)
Sodium: 136 mmol/L (ref 135–145)

## 2014-08-06 MED ORDER — IPRATROPIUM-ALBUTEROL 0.5-2.5 (3) MG/3ML IN SOLN
3.0000 mL | RESPIRATORY_TRACT | Status: DC
  Administered 2014-08-07 (×2): 3 mL via RESPIRATORY_TRACT
  Filled 2014-08-06 (×2): qty 3

## 2014-08-06 NOTE — Progress Notes (Addendum)
Patient asleep Snoring, no treatment given Patient requested to go back to sleep

## 2014-08-06 NOTE — Progress Notes (Signed)
[facility] PROGRESS NOTE  Brian Parsons PJA:[id number] DOB: 07-30-1950 DOA: 08/03/2014 PCP: No primary care provider on file.  Assessment/Plan: 1. Acute respiratory failure. Felt to be multifactorial related to COPD as well as CHF. CT chest was done which did not show any PE. Will continue current treatments. Continue to wean off oxygen as tolerated 2. COPD exacerbation. Patient has a long history of tobacco use. He will likely need PFTs as an outpatient. Slow improvement. Will continue nebs and abx, steroids.  3. Acute diastolic congestive heart failure. Improving with intravenous Lasix. Patient reports his lower extremity edema is improving as is his shortness of breath. His volume status is -2.7 L. Appears to be approaching euvolemia. On oral lasix 4. Polycythemia. Likely secondary to chronic hypoxia from tobacco abuse. Referred for outpatient hematology referral. 5. Elevated troponin. Likely demand ischemia. He is not having any chest pain or EKG changes. 6. Tobacco use. Counseled on the importance of tobacco cessation. Provide nicotine patch.  Code Status: Full code Family Communication: Discussed with family at the bedside Disposition Plan: Discharge home once improved   Consultants:    Procedures:  Echo:- Left ventricle: The cavity size was normal. Wall thickness was increased in a pattern of mild LVH. Systolic function was normal. The estimated ejection fraction was in the range of 60% to 65%. Wall motion was normal; there were no regional wall motion abnormalities. Left ventricular diastolic function parameters were normal. - Aortic valve: Mildly calcified annulus. Mildly thickened leaflets. Valve area (VTI): 3.42 cm^2. Valve area (Vmax): 3.39 cm^2. - Right ventricle: The cavity size was moderately dilated. The RV shares the apex with the LV. - Right atrium: The atrium was mildly dilated. - Atrial septum: No defect or patent foramen ovale was  identified. - Systemic veins: IVC is dilated with normal respiratory variation, estimated RA pressure is 8 mmHg.  Antibiotics:  Levaquin 4/19>>  HPI/Subjective: Feels that he is improving, cough is becoming more productive  Objective: Filed Vitals:   08/06/14 0622  BP: 127/90  Pulse: 89  Temp: 97.7 F (36.5 C)  Resp: 22    Intake/Output Summary (Last 24 hours) at 08/06/14 0932 Last data filed at 08/06/14 0857  Gross per 24 hour  Intake    726 ml  Output   1600 ml  Net   -874 ml   Filed Weights   08/04/14 0625 08/05/14 0550 08/06/14 0622  Weight: 103.828 kg (228 lb 14.4 oz) 104.4 kg (230 lb 2.6 oz) 104.962 kg (231 lb 6.4 oz)    Exam:   General:  NAD  Cardiovascular: s1, s2, rrr  Respiratory: improving air movement bilaterally with rhonchi  Abdomen: soft, obese, nt, bs+  Musculoskeletal: no edema b/l   Data Reviewed: Basic Metabolic Panel:  Recent Labs Lab 08/03/14 1300 08/03/14 1552 08/03/14 1611 08/04/14 0608 08/05/14 0609 08/06/14 0530  NA 140 139  --  139 136 136  K 4.3 4.1  --  4.8 4.7 4.8  CL 93* 91*  --  92* 89* 90*  CO2 36*  --   --  37* 37* 39*  GLUCOSE 99 106*  --  138* 140* 136*  BUN 13 14  --  20 31* 37*  CREATININE 1.07 1.10  --  1.16 1.19 1.25  CALCIUM 9.1  --   --  8.8 8.5 8.4  MG  --   --  1.9  --   --   --   PHOS  --   --  3.1  --   --   --  Liver Function Tests:  Recent Labs Lab 08/03/14 1300 08/04/14 0608  AST 23 19  ALT 20 20  ALKPHOS 115 102  BILITOT 0.7 0.9  PROT 7.4 7.3  ALBUMIN 4.1 3.9   No results for input(s): LIPASE, AMYLASE in the last 168 hours. No results for input(s): AMMONIA in the last 168 hours. CBC:  Recent Labs Lab 08/03/14 1300 08/03/14 1552 08/04/14 0608 08/05/14 0609  WBC 7.9  --  5.7 11.9*  NEUTROABS 4.4  --   --   --   HGB 20.5* 22.8* 19.6* 18.9*  HCT 62.6* 67.0* 61.2* 60.2*  MCV 93.7  --  94.7 93.2  PLT 135*  --  137* 165   Cardiac Enzymes:  Recent Labs Lab 08/03/14 1300  08/03/14 1801 08/04/14 0020  TROPONINI 0.04* 0.04* <0.03   BNP (last 3 results)  Recent Labs  08/03/14 1300  BNP 59.0    ProBNP (last 3 results) No results for input(s): PROBNP in the last 8760 hours.  CBG: No results for input(s): GLUCAP in the last 168 hours.  No results found for this or any previous visit (from the past 240 hour(s)).   Studies: Ct Angio Chest Pe W/cm &/or Wo Cm  08/05/2014   CLINICAL DATA:  Hypoxia, chest pain, shortness breath x2 weeks  EXAM: CT ANGIOGRAPHY CHEST WITH CONTRAST  TECHNIQUE: Multidetector CT imaging of the chest was performed using the standard protocol during bolus administration of intravenous contrast. Multiplanar CT image reconstructions and MIPs were obtained to evaluate the vascular anatomy.  CONTRAST:  119mL OMNIPAQUE IOHEXOL 350 MG/ML SOLN  COMPARISON:  None.  FINDINGS: Left arm contrast injection. The SVC is patent. There is mild four-chamber cardiac enlargement. Central pulmonary arteries are dilated. Satisfactory opacification of pulmonary arteries noted, and there is no evidence of pulmonary emboli. Patient motion degrades some of the images through the lung bases. Patent superior and inferior pulmonary veins bilaterally. Adequate contrast opacification of the thoracic aorta with no evidence of dissection, aneurysm, or stenosis. There is classic 3-vessel brachiocephalic arch anatomy without proximal stenosis. No significant atheromatous plaque.  No pleural or pericardial effusion. Subcentimeter prevascular and precarinal lymph nodes. No hilar adenopathy. Mild subsegmental atelectasis posteriorly at both lung bases. Mild emphysematous changes in the lung apices. Peribronchial thickening most marked in the right lower lobe. Lungs otherwise clear. Thoracic spine and sternum intact.  Visualized portions of upper abdomen unremarkable.  Review of the MIP images confirms the above findings.  IMPRESSION: 1. Negative for acute PE or thoracic aortic  dissection. 2. Emphysema and peribronchial thickening suggesting bronchitis.   Electronically Signed   By: Lucrezia Europe M.D.   On: 08/05/2014 10:44    Scheduled Meds: . furosemide  20 mg Oral Daily  . guaiFENesin  1,200 mg Oral BID  . heparin  5,000 Units Subcutaneous 3 times per day  . lisinopril  20 mg Oral Daily   And  . hydrochlorothiazide  12.5 mg Oral Daily  . ipratropium-albuterol  3 mL Nebulization Q4H  . levofloxacin  500 mg Oral Q24H  . methylPREDNISolone (SOLU-MEDROL) injection  80 mg Intravenous Q6H  . nicotine  21 mg Transdermal Daily  . sodium chloride  3 mL Intravenous Q12H   Continuous Infusions:   Principal Problem:   Hypoxia Active Problems:   COPD (chronic obstructive pulmonary disease)   Lower extremity edema   Elevated troponin   Polycythemia   Essential hypertension   Dyspnea   Acute respiratory failure with hypoxia   Acute diastolic  congestive heart failure    Time spent: 18mins    Brian Parsons  [facility] Pager [phone]. If 7PM-7AM, please contact night-coverage at [email or web address], password [facility] 08/06/2014, 9:32 AM  LOS: 3 days

## 2014-08-07 DIAGNOSIS — J441 Chronic obstructive pulmonary disease with (acute) exacerbation: Secondary | ICD-10-CM | POA: Insufficient documentation

## 2014-08-07 MED ORDER — IPRATROPIUM-ALBUTEROL 0.5-2.5 (3) MG/3ML IN SOLN: 3.0000 mL | Freq: Four times a day (QID) | RESPIRATORY_TRACT | Status: DC

## 2014-08-07 MED ORDER — LISINOPRIL 20 MG PO TABS: 10.0000 mg | ORAL_TABLET | Freq: Every day | ORAL | Status: DC

## 2014-08-07 MED ORDER — PREDNISONE 10 MG PO TABS: ORAL_TABLET | ORAL | Status: DC

## 2014-08-07 MED ORDER — GUAIFENESIN ER 600 MG PO TB12: 600.0000 mg | ORAL_TABLET | Freq: Two times a day (BID) | ORAL | Status: DC

## 2014-08-07 MED ORDER — NICOTINE 21 MG/24HR TD PT24: 21.0000 mg | MEDICATED_PATCH | Freq: Every day | TRANSDERMAL | Status: DC

## 2014-08-07 MED ORDER — HYDROXYZINE HCL 25 MG PO TABS: 25.0000 mg | ORAL_TABLET | Freq: Three times a day (TID) | ORAL | Status: DC | PRN

## 2014-08-07 MED ORDER — FLUTICASONE-SALMETEROL 250-50 MCG/DOSE IN AEPB: 1.0000 | INHALATION_SPRAY | Freq: Two times a day (BID) | RESPIRATORY_TRACT | Status: DC

## 2014-08-07 MED ORDER — FUROSEMIDE 20 MG PO TABS: 20.0000 mg | ORAL_TABLET | Freq: Every day | ORAL | Status: DC

## 2014-08-07 MED ORDER — LEVOFLOXACIN 500 MG PO TABS: 500.0000 mg | ORAL_TABLET | ORAL | Status: DC

## 2014-08-07 NOTE — Discharge Summary (Signed)
Physician Discharge Summary  Brian Parsons KDX:[id number] DOB: 01-08-1951 DOA: 08/03/2014  PCP: No primary care provider on file.  Admit date: 08/03/2014 Discharge date: 08/07/2014  Time spent: 45 minutes  Recommendations for Outpatient Follow-up:  1. Patient was discharged home with 3L oxygen 2. Follow up with primary care physician in 1-2 weeks 3. HH RN was arranged prior to discharge 4. Outpatient hematology consult has been arranged for evaluation of polycythemia  Discharge Diagnoses:  Principal Problem:   Hypoxia Active Problems:   COPD (chronic obstructive pulmonary disease)   Lower extremity edema   Elevated troponin   Polycythemia   Essential hypertension   Dyspnea   Acute respiratory failure with hypoxia   Acute diastolic congestive heart failure   COPD exacerbation   Discharge Condition: improving  Diet recommendation: low salt  Filed Weights   08/05/14 0550 08/06/14 0622 08/07/14 0530  Weight: 104.4 kg (230 lb 2.6 oz) 104.962 kg (231 lb 6.4 oz) 108.138 kg (238 lb 6.4 oz)    History of present illness:  This patient presents to the hospital with progressive shortness of breath that began approximately 2 weeks prior to admission. He had associated productive cough and wheezing. He was seen by his primary care physician and sent to the emergency room for evaluation. He was found to have a COPD exacerbation.  Hospital Course:  On admission, patient was found to be in acute hypoxic respiratory failure. This was felt to be multifactorial related to COPD exacerbation and some element of CHF. He was initially started on intravenous Lasix and had good diuresis. Once he achieves euvolemic, he was transitioned to oral Lasix. Echocardiogram showed normal EF. History with high-dose steroids, antibiotics and bronchodilators for COPD exacerbation. His improvement was slow. Patient is very insistent on being discharged home today. He is breathing comfortably on 3 L. This will likely  need to be weaned off over the next 1-2 weeks. He is in place on a prednisone taper. He no longer has any wheezes and appears to be approaching his baseline. He was strongly advised to abstain from any further smoking. He was incidentally noted to have polycythemia which is likely secondary to his chronic hypoxia from smoking. In any case, he's been referred to hematology for further workup as an outpatient.  Procedures:  Echo:- Left ventricle: The cavity size was normal. Wall thickness was increased in a pattern of mild LVH. Systolic function was normal. The estimated ejection fraction was in the range of 60% to 65%. Wall motion was normal; there were no regional wall motion abnormalities. Left ventricular diastolic function parameters were normal. - Aortic valve: Mildly calcified annulus. Mildly thickened leaflets. Valve area (VTI): 3.42 cm^2. Valve area (Vmax): 3.39 cm^2. - Right ventricle: The cavity size was moderately dilated. The RV shares the apex with the LV. - Right atrium: The atrium was mildly dilated. - Atrial septum: No defect or patent foramen ovale was identified. - Systemic veins: IVC is dilated with normal respiratory variation, estimated RA pressure is 8 mmHg. - Technically adequate study.  Consultations:    Discharge Exam: Filed Vitals:   08/07/14 0900  BP: 134/80  Pulse: 110  Temp: 97.9 F (36.6 C)  Resp: 20    General: NAD Cardiovascular: s1,s2 rrr Respiratory: diminished breath sounds bilaterally  Discharge Instructions   Discharge Instructions    Diet - low sodium heart healthy    Complete by:  As directed      Face-to-face encounter (required for Medicare/Medicaid patients)    Complete  by:  As directed   I Mohit Zirbes certify that this patient is under my care and that I, or a nurse practitioner or physician's assistant working with me, had a face-to-face encounter that meets the physician face-to-face encounter requirements  with this patient on 08/07/2014. The encounter with the patient was in whole, or in part for the following medical condition(s) which is the primary reason for home health care (List medical condition): copd exacerbation  The encounter with the patient was in whole, or in part, for the following medical condition, which is the primary reason for home health care:  copd exacerbation  I certify that, based on my findings, the following services are medically necessary home health services:  Nursing  Reason for Medically Necessary Home Health Services:  Skilled Nursing- Skilled Assessment/Observation  My clinical findings support the need for the above services:  Shortness of breath with activity  Further, I certify that my clinical findings support that this patient is homebound due to:  Shortness of Breath with activity     Home Health    Complete by:  As directed   To provide the following care/treatments:  RN     Increase activity slowly    Complete by:  As directed           Discharge Medication List as of 08/07/2014 11:59 AM    START taking these medications   Details  Fluticasone-Salmeterol (ADVAIR DISKUS) 250-50 MCG/DOSE AEPB Inhale 1 puff into the lungs 2 (two) times daily., Starting 08/07/2014, Until Discontinued, Print    furosemide (LASIX) 20 MG tablet Take 1 tablet (20 mg total) by mouth daily., Starting 08/07/2014, Until Discontinued, Print    guaiFENesin (MUCINEX) 600 MG 12 hr tablet Take 1 tablet (600 mg total) by mouth 2 (two) times daily., Starting 08/07/2014, Until Discontinued, Print    ipratropium-albuterol (DUONEB) 0.5-2.5 (3) MG/3ML SOLN Take 3 mLs by nebulization every 6 (six) hours., Starting 08/07/2014, Until Discontinued, Print    levofloxacin (LEVAQUIN) 500 MG tablet Take 1 tablet (500 mg total) by mouth daily., Starting 08/07/2014, Until Discontinued, Print    lisinopril (PRINIVIL,ZESTRIL) 20 MG tablet Take 0.5 tablets (10 mg total) by mouth daily., Starting 08/07/2014,  Until Discontinued, Print    nicotine (NICODERM CQ - DOSED IN MG/24 HOURS) 21 mg/24hr patch Place 1 patch (21 mg total) onto the skin daily., Starting 08/07/2014, Until Discontinued, Print    predniSONE (DELTASONE) 10 MG tablet Take 60mg  po daily for 2 days then 40mg  po daily for 2 days then 30mg  po daily for 2 days then 20mg  po daily for 2 days then 10mg  po daily for 2 days then stop, Print      CONTINUE these medications which have NOT CHANGED   Details  Magnesium Hydroxide (MAGNESIA PO) Take 1 tablet by mouth daily., Until Discontinued, Historical Med      STOP taking these medications     lisinopril-hydrochlorothiazide (PRINZIDE,ZESTORETIC) 20-12.5 MG per tablet        Allergies  Allergen Reactions  . Amoxicillin Hives  . Penicillins Hives   Follow-up Information    Follow up with Molli Hazard, MD. Go on 08/19/2014.   Specialties:  Hematology and Oncology, Oncology   Why:  @ 2:00PM   Contact information:   [address] [place] [address] [phone]        The results of significant diagnostics from this hospitalization (including imaging, microbiology, ancillary and laboratory) are listed below for reference.    Significant Diagnostic Studies:  Dg Chest 2 View  08/03/2014   CLINICAL DATA:  Cough.  Retaining fluid.  EXAM: CHEST  2 VIEW  COMPARISON:  08/03/2014 1300 hours  FINDINGS: Lungs are better aerated. Bibasilar atelectasis has improved. Heart remains borderline enlarged. Normal vascularity. No pneumothorax. Upper lumbar compression deformity is present with 50% loss of height anteriorly.  IMPRESSION: Improved bibasilar atelectasis.  Upper lumbar compression fracture.   Electronically Signed   By: Marybelle Killings M.D.   On: 08/03/2014 16:48   Ct Angio Chest Pe W/cm &/or Wo Cm  08/05/2014   CLINICAL DATA:  Hypoxia, chest pain, shortness breath x2 weeks  EXAM: CT ANGIOGRAPHY CHEST WITH CONTRAST  TECHNIQUE: Multidetector CT imaging of the chest was performed  using the standard protocol during bolus administration of intravenous contrast. Multiplanar CT image reconstructions and MIPs were obtained to evaluate the vascular anatomy.  CONTRAST:  137mL OMNIPAQUE IOHEXOL 350 MG/ML SOLN  COMPARISON:  None.  FINDINGS: Left arm contrast injection. The SVC is patent. There is mild four-chamber cardiac enlargement. Central pulmonary arteries are dilated. Satisfactory opacification of pulmonary arteries noted, and there is no evidence of pulmonary emboli. Patient motion degrades some of the images through the lung bases. Patent superior and inferior pulmonary veins bilaterally. Adequate contrast opacification of the thoracic aorta with no evidence of dissection, aneurysm, or stenosis. There is classic 3-vessel brachiocephalic arch anatomy without proximal stenosis. No significant atheromatous plaque.  No pleural or pericardial effusion. Subcentimeter prevascular and precarinal lymph nodes. No hilar adenopathy. Mild subsegmental atelectasis posteriorly at both lung bases. Mild emphysematous changes in the lung apices. Peribronchial thickening most marked in the right lower lobe. Lungs otherwise clear. Thoracic spine and sternum intact.  Visualized portions of upper abdomen unremarkable.  Review of the MIP images confirms the above findings.  IMPRESSION: 1. Negative for acute PE or thoracic aortic dissection. 2. Emphysema and peribronchial thickening suggesting bronchitis.   Electronically Signed   By: Lucrezia Europe M.D.   On: 08/05/2014 10:44   Dg Chest Portable 1 View  08/03/2014   CLINICAL DATA:  64 year old male with shortness of breath and lower extremity edema for the past 2 weeks.  EXAM: PORTABLE CHEST - 1 VIEW  COMPARISON:  None.  FINDINGS: Mild cardiomegaly. There is pulmonary vascular congestion with cephalization. No overt pulmonary edema. Nonspecific bibasilar opacities. No pneumothorax. No acute osseous abnormality.  IMPRESSION: 1. Nonspecific bibasilar opacities greater  on the right than the left. Differential considerations include dependent interstitial edema, atelectasis and infiltrate. Recommend dedicated PA and lateral chest x-ray when the patient is able. 2. Cardiomegaly and pulmonary vascular congestion without overt edema.   Electronically Signed   By: Jacqulynn Cadet M.D.   On: 08/03/2014 13:34    Microbiology: No results found for this or any previous visit (from the past 240 hour(s)).   Labs: Basic Metabolic Panel:  Recent Labs Lab 08/03/14 1300 08/03/14 1552 08/03/14 1611 08/04/14 0608 08/05/14 0609 08/06/14 0530  NA 140 139  --  139 136 136  K 4.3 4.1  --  4.8 4.7 4.8  CL 93* 91*  --  92* 89* 90*  CO2 36*  --   --  37* 37* 39*  GLUCOSE 99 106*  --  138* 140* 136*  BUN 13 14  --  20 31* 37*  CREATININE 1.07 1.10  --  1.16 1.19 1.25  CALCIUM 9.1  --   --  8.8 8.5 8.4  MG  --   --  1.9  --   --   --  PHOS  --   --  3.1  --   --   --    Liver Function Tests:  Recent Labs Lab 08/03/14 1300 08/04/14 0608  AST 23 19  ALT 20 20  ALKPHOS 115 102  BILITOT 0.7 0.9  PROT 7.4 7.3  ALBUMIN 4.1 3.9   No results for input(s): LIPASE, AMYLASE in the last 168 hours. No results for input(s): AMMONIA in the last 168 hours. CBC:  Recent Labs Lab 08/03/14 1300 08/03/14 1552 08/04/14 0608 08/05/14 0609  WBC 7.9  --  5.7 11.9*  NEUTROABS 4.4  --   --   --   HGB 20.5* 22.8* 19.6* 18.9*  HCT 62.6* 67.0* 61.2* 60.2*  MCV 93.7  --  94.7 93.2  PLT 135*  --  137* 165   Cardiac Enzymes:  Recent Labs Lab 08/03/14 1300 08/03/14 1801 08/04/14 0020  TROPONINI 0.04* 0.04* <0.03   BNP: BNP (last 3 results)  Recent Labs  08/03/14 1300  BNP 59.0    ProBNP (last 3 results) No results for input(s): PROBNP in the last 8760 hours.  CBG: No results for input(s): GLUCAP in the last 168 hours.     Signed:  Jaydon Parsons  [facility] 08/07/2014, 5:28 PM

## 2014-08-07 NOTE — Progress Notes (Signed)
NURSING PROGRESS NOTE  Brian Parsons [id number] Discharge Data: 08/07/2014 1:52 PM Attending Provider: No att. providers found PCP:No primary care provider on file.   Lunette Stands to be D/C'd Home per MD order.    All IV's discontinued and monitored for bleeding.  All belongings be returned to patient for patient to take home.  AVS summary and prescriptions reviewed with patient and family.  O2 tank delivered to patient from [facility].   Patient left floor via wheelchair, escorted by NT.   Last Documented Vital Signs:  Blood pressure 134/80, pulse 110, temperature 97.9 F (36.6 C), temperature source Oral, resp. rate 20, height 5\' 8"  (1.727 m), weight 108.138 kg (238 lb 6.4 oz), SpO2 84 %.  Cecilie Kicks D

## 2014-08-07 NOTE — Plan of Care (Signed)
Problem: Consults Goal: Respiratory Problems Patient Education See Patient Education Module for education specifics.  Outcome: Completed/Met Date Met:  08/07/14 Handout on COPD provided and explained.  Problem: ICU Phase Progression Outcomes Goal: O2 sats trending toward baseline Outcome: Adequate for Discharge Patient will be going home on O2.

## 2014-08-07 NOTE — Progress Notes (Signed)
SATURATION QUALIFICATIONS: (This note is used to comply with regulatory documentation for home oxygen)  Patient Saturations on Room Air at Rest = 88%  Patient Saturations on Room Air while Ambulating = 81%  Patient Saturations on 3 Liters of oxygen while Ambulating = 91%  Please briefly explain why patient needs home oxygen: Unable to maintain O2 saturation >90% on room air at rest in the presence of COPD.

## 2014-08-10 NOTE — Care Management Utilization Note (Signed)
UR completed 

## 2014-08-19 ENCOUNTER — Encounter (HOSPITAL_COMMUNITY): Payer: Medicare Other | Attending: Hematology & Oncology | Admitting: Hematology & Oncology

## 2014-08-19 ENCOUNTER — Encounter (HOSPITAL_COMMUNITY): Payer: Self-pay | Admitting: Hematology & Oncology

## 2014-08-19 VITALS — BP 155/97 | HR 81 | Temp 98.2°F | Resp 20 | Ht 65.5 in | Wt 233.2 lb

## 2014-08-19 DIAGNOSIS — D751 Secondary polycythemia: Secondary | ICD-10-CM | POA: Diagnosis present

## 2014-08-19 DIAGNOSIS — E669 Obesity, unspecified: Secondary | ICD-10-CM | POA: Insufficient documentation

## 2014-08-19 DIAGNOSIS — Z720 Tobacco use: Secondary | ICD-10-CM | POA: Diagnosis not present

## 2014-08-19 DIAGNOSIS — I10 Essential (primary) hypertension: Secondary | ICD-10-CM | POA: Diagnosis not present

## 2014-08-19 DIAGNOSIS — J449 Chronic obstructive pulmonary disease, unspecified: Secondary | ICD-10-CM | POA: Diagnosis not present

## 2014-08-19 LAB — CBC WITH DIFFERENTIAL/PLATELET
BASOS ABS: 0 10*3/uL (ref 0.0–0.1)
BASOS PCT: 0 % (ref 0–1)
EOS PCT: 2 % (ref 0–5)
Eosinophils Absolute: 0.3 10*3/uL (ref 0.0–0.7)
HCT: 60.5 % — ABNORMAL HIGH (ref 39.0–52.0)
Hemoglobin: 18.9 g/dL — ABNORMAL HIGH (ref 13.0–17.0)
Lymphocytes Relative: 25 % (ref 12–46)
Lymphs Abs: 2.8 10*3/uL (ref 0.7–4.0)
MCH: 29.6 pg (ref 26.0–34.0)
MCHC: 31.2 g/dL (ref 30.0–36.0)
MCV: 94.7 fL (ref 78.0–100.0)
Monocytes Absolute: 0.9 10*3/uL (ref 0.1–1.0)
Monocytes Relative: 8 % (ref 3–12)
NEUTROS PCT: 65 % (ref 43–77)
Neutro Abs: 7.4 10*3/uL (ref 1.7–7.7)
PLATELETS: 161 10*3/uL (ref 150–400)
RBC: 6.39 MIL/uL — AB (ref 4.22–5.81)
RDW: 13.4 % (ref 11.5–15.5)
WBC: 11.3 10*3/uL — ABNORMAL HIGH (ref 4.0–10.5)

## 2014-08-19 LAB — COMPREHENSIVE METABOLIC PANEL
ALBUMIN: 3.7 g/dL (ref 3.5–5.0)
ALK PHOS: 75 U/L (ref 38–126)
ALT: 28 U/L (ref 17–63)
AST: 21 U/L (ref 15–41)
Anion gap: 9 (ref 5–15)
BILIRUBIN TOTAL: 0.5 mg/dL (ref 0.3–1.2)
BUN: 20 mg/dL (ref 6–20)
CO2: 40 mmol/L — ABNORMAL HIGH (ref 22–32)
Calcium: 8.6 mg/dL — ABNORMAL LOW (ref 8.9–10.3)
Chloride: 93 mmol/L — ABNORMAL LOW (ref 101–111)
Creatinine, Ser: 1.12 mg/dL (ref 0.61–1.24)
GFR calc Af Amer: >60 mL/min (ref >60)
GFR calc non Af Amer: >60 mL/min (ref >60)
Glucose, Bld: 74 mg/dL (ref 70–99)
POTASSIUM: 3.7 mmol/L (ref 3.5–5.1)
SODIUM: 142 mmol/L (ref 135–145)
Total Protein: 6.4 g/dL — ABNORMAL LOW (ref 6.5–8.1)

## 2014-08-19 LAB — VITAMIN B12: Vitamin B-12: 281 pg/mL (ref 180–914)

## 2014-08-19 LAB — FERRITIN: Ferritin: 92 ng/mL (ref 24–336)

## 2014-08-19 NOTE — Patient Instructions (Signed)
.  [place] at [facility] Discharge Instructions  RECOMMENDATIONS MADE BY THE CONSULTANT AND ANY TEST RESULTS WILL BE SENT TO YOUR REFERRING PHYSICIAN.  Labs today and return in 2 weeks  Thank you for choosing [place] [facility] to provide your oncology and hematology care.  To afford each patient quality time with our provider, please arrive at least 15 minutes before your scheduled appointment time.    You need to re-schedule your appointment should you arrive 10 or more minutes late.  We strive to give you quality time with our providers, and arriving late affects you and other patients whose appointments are after yours.  Also, if you no show three or more times for appointments you may be dismissed from the clinic at the providers discretion.     Again, thank you for choosing [facility].  Our hope is that these requests will decrease the amount of time that you wait before being seen by our physicians.       _____________________________________________________________  Should you have questions after your visit to [facility], please contact our office at [phone] between the hours of 8:30 a.m. and 4:30 p.m.  Voicemails left after 4:30 p.m. will not be returned until the following business day.  For prescription refill requests, have your pharmacy contact our office.

## 2014-08-19 NOTE — Progress Notes (Addendum)
[facility] NOTE  CHIEF COMPLAINTS/PURPOSE OF CONSULTATION:  Polycythemia  Hemoglobin of 22.8, HCT 67 of 08/03/14 COPD Tobacco abuse  HISTORY OF PRESENTING ILLNESS:  Brian Parsons 64 y.o. male is here because of polycythemia. He was admitted to [facility] on 08/03/2014 for a COPD exacerbation. During his admission he was noted to have polycythemia. He is referred for additional evaluation. He notes he is currently breathing well and denies cough. He had a cough prior to hospitalization. He has quit smoking since his discharge with the use of the nicotine patch. He is not committing however to quitting completely. He has a heavy smoking history starting at age 15.  He has never had a sleep study and admits to snoring. He states he does not think he can sleep in bed but his own. He denies a red face, pruritus especially after showering, headaches, or history of DVT or stroke.  He had a colonoscopy in the past. MEDICAL HISTORY:  Past Medical History  Diagnosis Date  . Hypertension     SURGICAL HISTORY: History reviewed. No pertinent past surgical history.  SOCIAL HISTORY: History   Social History  . Marital Status: Divorced    Spouse Name: N/A  . Number of Children: N/A  . Years of Education: N/A   Occupational History  . Not on file.   Social History Main Topics  . Smoking status: Current Every Day Smoker -- 1.00 packs/day for 50 years    Types: Cigarettes  . Smokeless tobacco: Not on file  . Alcohol Use: No     Comment: no etoh for 7 years  . Drug Use: No  . Sexual Activity: Yes   Other Topics Concern  . Not on file   Social History Narrative  Two son grown adult 3 grandchildren  Smoke started at 1, 1.5-2 packs a day to age 80. He wears nicotine patch. 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 He divorced and currently engaged. Born [place], [place]: hunting fishing  softball   FAMILY HISTORY: Family History  Problem Relation Age of Onset  . Stroke Mother   Mother 51 death stroke blood clot in harm gangrene Dad 74 brown lung One living brother One died at age of 74 due to a motorcycle accident One died at age of 26 possible brain aneurysm  has no family status information on file.   ALLERGIES:  is allergic to amoxicillin and penicillins.  MEDICATIONS:  Current Outpatient Prescriptions  Medication Sig Dispense Refill  . Fluticasone-Salmeterol (ADVAIR DISKUS) 250-50 MCG/DOSE AEPB Inhale 1 puff into the lungs 2 (two) times daily. 60 each 1  . furosemide (LASIX) 20 MG tablet Take 1 tablet (20 mg total) by mouth daily. 30 tablet 1  . hydrOXYzine (ATARAX/VISTARIL) 25 MG tablet Take 1 tablet (25 mg total) by mouth 3 (three) times daily as needed for anxiety. 30 tablet 0  . ipratropium-albuterol (DUONEB) 0.5-2.5 (3) MG/3ML SOLN Take 3 mLs by nebulization every 6 (six) hours. 360 mL 1  . lisinopril (PRINIVIL,ZESTRIL) 20 MG tablet Take 0.5 tablets (10 mg total) by mouth daily. 30 tablet 1  . Magnesium Hydroxide (MAGNESIA PO) Take 1 tablet by mouth daily.    . nicotine (NICODERM CQ - DOSED IN MG/24 HOURS) 21 mg/24hr patch Place 1 patch (21 mg total) onto the skin daily. 28 patch 0  . guaiFENesin (MUCINEX) 600 MG 12 hr tablet Take 1 tablet (  600 mg total) by mouth 2 (two) times daily. (Patient not taking: Reported on 08/19/2014) 20 tablet 0  . levofloxacin (LEVAQUIN) 500 MG tablet Take 1 tablet (500 mg total) by mouth daily. (Patient not taking: Reported on 08/19/2014) 3 tablet 0  . predniSONE (DELTASONE) 10 MG tablet Take 60mg  po daily for 2 days then 40mg  po daily for 2 days then 30mg  po daily for 2 days then 20mg  po daily for 2 days then 10mg  po daily for 2 days then stop (Patient not taking: Reported on 08/19/2014) 32 tablet 0   No current facility-administered medications for this visit.    Review of Systems  HENT: Negative for ear pain.   Eyes: Negative for  blurred vision and double vision.  Cardiovascular: Negative for chest pain and leg swelling.  Gastrointestinal: Negative for heartburn, abdominal pain and constipation.  Musculoskeletal: Positive for back pain. Negative for joint pain.  Neurological: Positive for headaches. Negative for seizures.  Psychiatric/Behavioral: Positive for depression.   14 point ROS was done and is otherwise as detailed above or in HPI   PHYSICAL EXAMINATION: ECOG PERFORMANCE STATUS: 1 - Symptomatic but completely ambulatory  Filed Vitals:   08/19/14 1446  BP: 155/97  Pulse: 81  Temp: 98.2 F (36.8 C)  Resp: 20   Filed Weights   08/19/14 1446  Weight: 233 lb 3.2 oz (105.779 kg)     Physical Exam  Constitutional: He is oriented to person, place, and time and well-developed, well-nourished, and in no distress.  HENT:  Head: Normocephalic and atraumatic.  Nose: Nose normal.  Mouth/Throat: Oropharynx is clear and moist. No oropharyngeal exudate.  Edentulous  Eyes: Conjunctivae and EOM are normal. Pupils are equal, round, and reactive to light. Right eye exhibits no discharge. Left eye exhibits no discharge. No scleral icterus.  Neck: Normal range of motion. Neck supple. No tracheal deviation present. No thyromegaly present.  Cardiovascular: Normal rate, regular rhythm and normal heart sounds.  Exam reveals no gallop and no friction rub.   No murmur heard. Ectopy  Pulmonary/Chest: Effort normal and breath sounds normal. He has no wheezes. He has no rales.  Diminished breath sounds bilaterally.  Abdominal: Soft. Bowel sounds are normal. He exhibits no distension and no mass. There is no tenderness. There is no rebound and no guarding.  Musculoskeletal: Normal range of motion. He exhibits no edema.  Lymphadenopathy:    He has no cervical adenopathy.  Neurological: He is alert and oriented to person, place, and time. He has normal reflexes. No cranial nerve deficit. Gait normal. Coordination normal.    Skin: Skin is warm and dry. No rash noted. Nails show clubbing.  Psychiatric: Mood, memory, affect and judgment normal.  Nursing note and vitals reviewed.     LABORATORY DATA:  I have reviewed the data as listed Lab Results  Component Value Date   WBC 11.9* 08/05/2014   HGB 18.9* 08/05/2014   HCT 60.2* 08/05/2014   MCV 93.2 08/05/2014   PLT 165 08/05/2014    RADIOGRAPHIC STUDIES: I have personally reviewed the radiological images as listed and agreed with the findings in the report. CLINICAL DATA: Hypoxia, chest pain, shortness breath x2 weeks  EXAM: CT ANGIOGRAPHY CHEST WITH CONTRAST  TECHNIQUE: Multidetector CT imaging of the chest was performed using the standard protocol during bolus administration of intravenous contrast. Multiplanar CT image reconstructions and MIPs were obtained to evaluate the vascular anatomy.  CONTRAST: 151mL OMNIPAQUE IOHEXOL 350 MG/ML SOLN  COMPARISON: None.  FINDINGS: Left arm  contrast injection. The SVC is patent. There is mild four-chamber cardiac enlargement. Central pulmonary arteries are dilated. Satisfactory opacification of pulmonary arteries noted, and there is no evidence of pulmonary emboli. Patient motion degrades some of the images through the lung bases. Patent superior and inferior pulmonary veins bilaterally. Adequate contrast opacification of the thoracic aorta with no evidence of dissection, aneurysm, or stenosis. There is classic 3-vessel brachiocephalic arch anatomy without proximal stenosis. No significant atheromatous plaque.  No pleural or pericardial effusion. Subcentimeter prevascular and precarinal lymph nodes. No hilar adenopathy. Mild subsegmental atelectasis posteriorly at both lung bases. Mild emphysematous changes in the lung apices. Peribronchial thickening most marked in the right lower lobe. Lungs otherwise clear. Thoracic spine and sternum intact.  Visualized portions of upper abdomen  unremarkable.  Review of the MIP images confirms the above findings.  IMPRESSION: 1. Negative for acute PE or thoracic aortic dissection. 2. Emphysema and peribronchial thickening suggesting bronchitis.   Electronically Signed  By: Lucrezia Europe M.D.  On: 08/05/2014 10:44  ASSESSMENT & PLAN:  Polycythemia Tobacco abuse  COPD Obesity  I discussed polycythemia and explained to the patient what it meant. Reviewed with him initial evaluation of polycythemia. I discussed primary versus secondary polycythemia. He does not have significant symptoms do to his polycythemia such as chest or abdominal pain, weakness, headaches, or blurry vision. He does have underlying pulmonary disease secondary to significant smoking history. He has had a recent hospital admission for a COPD exacerbation. He does not have post-bath pruritus, erythromelalgia , gout, or evidence of thromboses. He does not have early satiety. He has no family history of polycythemia. He is not on hormone replacement therapy. We will proceed with a laboratory studies as ordered below.   Orders Placed This Encounter  Procedures  . CBC with Differential    Standing Status: Future     Number of Occurrences: 1     Standing Expiration Date: 08/19/2015  . Comprehensive metabolic panel    Standing Status: Future     Number of Occurrences: 1     Standing Expiration Date: 08/19/2015  . JAK2 genotypr    JAK2 [id number] mutation  JAK 2 EXON 12 mutation    Standing Status: Future     Number of Occurrences: 1     Standing Expiration Date: 08/19/2015  . Carboxyhemoglobin  . Erythropoietin  . Ferritin  . Vitamin B12    I will keep him apprised of the results when they become available. I advised him that based upon the results of his laboratory studies we will recommend additional therapy and/or monitoring of his polycythemia. I will schedule him for follow-up within the next week and a half to 2 weeks. All questions were answered. The  patient knows to call the clinic with any problems, questions or concerns.  This note was electronically signed.    This document serves as a record of services personally performed by Ancil Linsey, MD. It was created on her behalf by Jeralene Peters. a trained medical scribe. The creation of this record is based on the scribe's personal observations and the provider's statements to them. This document has been checked and approved by the attending provider. I have reviewed the above documentation for accuracy and completeness and I agree with the above.  Ancil Linsey, MD

## 2014-08-20 LAB — ERYTHROPOIETIN: Erythropoietin: 6.1 m[IU]/mL (ref 2.6–18.5)

## 2014-08-21 LAB — JAK2 GENOTYPR

## 2014-08-24 ENCOUNTER — Telehealth (HOSPITAL_COMMUNITY): Payer: Self-pay | Admitting: Emergency Medicine

## 2014-08-24 ENCOUNTER — Encounter (HOSPITAL_COMMUNITY): Payer: Self-pay | Admitting: Emergency Medicine

## 2014-08-24 DIAGNOSIS — D751 Secondary polycythemia: Secondary | ICD-10-CM

## 2014-08-24 NOTE — Telephone Encounter (Signed)
Called to notify of phlebotomy

## 2014-08-24 NOTE — Telephone Encounter (Signed)
Called pt to notify him that short stay would be contacting him to schedule his phlebotomies.  Orders in, Short stay notified

## 2014-08-24 NOTE — Telephone Encounter (Signed)
-----   Message from Patrici Ranks, MD sent at 08/23/2014  9:43 PM EDT ----- Set up for phlebotomy of 2 units. Dr.P

## 2014-08-27 ENCOUNTER — Encounter (HOSPITAL_COMMUNITY): Payer: Self-pay

## 2014-08-27 ENCOUNTER — Encounter (HOSPITAL_COMMUNITY)
Admission: RE | Admit: 2014-08-27 | Discharge: 2014-08-27 | Disposition: A | Discharge status: Home / Self Care | Payer: Medicare Other | Source: Ambulatory Visit | Attending: Hematology & Oncology | Admitting: Hematology & Oncology

## 2014-08-27 VITALS — BP 119/81 | HR 77 | Temp 98.4°F | Resp 20 | Ht 65.5 in | Wt 233.0 lb

## 2014-08-27 DIAGNOSIS — D751 Secondary polycythemia: Secondary | ICD-10-CM | POA: Insufficient documentation

## 2014-08-27 DIAGNOSIS — Z01818 Encounter for other preprocedural examination: Secondary | ICD-10-CM | POA: Insufficient documentation

## 2014-08-27 NOTE — Discharge Instructions (Signed)
Terron Merfeld presents today for phlebotomy per MD orders. HGB/HCT:n/a Phlebotomy procedure started at 0941 and ended at 0950 21 ounces removed. Patient tolerated procedure well. IV needle removed intact. Therapeutic Phlebotomy, Care After Refer to this sheet in the next few weeks. These instructions provide you with information on caring for yourself after your procedure. Your caregiver may also give you more specific instructions. Your treatment has been planned according to current medical practices, but problems sometimes occur. Call your caregiver if you have any problems or questions after your procedure. HOME CARE INSTRUCTIONS Most people can go back to their normal activities right away. Before you leave, be sure to ask if there is anything you should or should not do. In general, it would be wise to:  Keep the bandage dry. You can remove the bandage after about 5 hours.  Eat well-balanced meals for the next 24 hours.  Drink enough fluids to keep your urine clear or pale yellow.  Avoid drinking alcohol minimally until after eating.  Avoid smoking for at least 30 minutes after the procedure.  Avoid strenuous physical activity or heavy lifting or pulling for about 5 hours after the procedure.  Athletes should avoid strenuous exercise for 12 hours or more.  Change positions slowly for the remainder of the day to prevent light-headedness or fainting.  If you feel light-headed, lie down until the feeling subsides.  If you have bleeding from the needle insertion site, elevate your arm and press firmly on the site until the bleeding stops.  If bruising or bleeding appears under the skin, apply ice to the area for 15 to 20 minutes, 3 to 4 times per day. Put the ice in a plastic bag and place a towel between the bag of ice and your skin. Do this while you are awake for the first 24 hours. The ice packs can be stopped before 24 hours if the swelling goes away. If swelling persists after  24 hours, a warm, moist washcloth can be applied to the area for 15 to 20 minutes, 3 to 4 times per day. The warm, moist treatments can be stopped when the swelling goes away.  It is important to continue further therapeutic phlebotomy as directed by your caregiver. SEEK MEDICAL CARE IF:  There is bleeding or fluid leaking from the needle insertion site.  The needle insertion site becomes swollen, red, or sore.  You feel light-headed, dizzy or nauseated, and the feeling does not go away.  You notice new bruising at the needle insertion site.  You feel more weak or tired than normal.  You develop a fever. SEEK IMMEDIATE MEDICAL CARE IF:   There is increased bleeding, pain, or swelling from the needle insertion site.  You have severe nausea or vomiting.  You have chest pain.  You have trouble breathing. MAKE SURE YOU:  Understand these instructions.  Will watch your condition.  Will get help right away if you are not doing well or get worse. Document Released: 09/05/2010 Document Revised: 08/18/2013 Document Reviewed: 09/05/2010 [facility] Patient Information 2015 [place], [place]. This information is not intended to replace advice given to you by your health care provider. Make sure you discuss any questions you have with your health care provider.

## 2014-09-01 ENCOUNTER — Telehealth (HOSPITAL_COMMUNITY): Payer: Self-pay | Admitting: *Deleted

## 2014-09-01 NOTE — Telephone Encounter (Signed)
Erroneous encounter

## 2014-09-02 ENCOUNTER — Encounter (HOSPITAL_COMMUNITY)
Admission: RE | Admit: 2014-09-02 | Discharge: 2014-09-02 | Disposition: A | Discharge status: Home / Self Care | Payer: Medicare Other | Source: Ambulatory Visit | Attending: Hematology & Oncology | Admitting: Hematology & Oncology

## 2014-09-02 ENCOUNTER — Encounter (HOSPITAL_BASED_OUTPATIENT_CLINIC_OR_DEPARTMENT_OTHER): Payer: Medicare Other | Admitting: Hematology & Oncology

## 2014-09-02 ENCOUNTER — Encounter (HOSPITAL_COMMUNITY): Payer: Self-pay

## 2014-09-02 ENCOUNTER — Encounter (HOSPITAL_COMMUNITY): Payer: Self-pay | Admitting: Hematology & Oncology

## 2014-09-02 VITALS — BP 144/80 | HR 75 | Temp 98.4°F | Resp 18 | Wt 233.9 lb

## 2014-09-02 DIAGNOSIS — Z720 Tobacco use: Secondary | ICD-10-CM | POA: Diagnosis not present

## 2014-09-02 DIAGNOSIS — J449 Chronic obstructive pulmonary disease, unspecified: Secondary | ICD-10-CM

## 2014-09-02 DIAGNOSIS — D751 Secondary polycythemia: Secondary | ICD-10-CM

## 2014-09-02 DIAGNOSIS — Z01818 Encounter for other preprocedural examination: Secondary | ICD-10-CM | POA: Diagnosis not present

## 2014-09-02 DIAGNOSIS — R0902 Hypoxemia: Secondary | ICD-10-CM

## 2014-09-02 LAB — JAK2 V617F, W REFLEX TO CALR/E12/MPL

## 2014-09-02 NOTE — Patient Instructions (Signed)
..  [place] at [facility] Discharge Instructions  RECOMMENDATIONS MADE BY THE CONSULTANT AND ANY TEST RESULTS WILL BE SENT TO YOUR REFERRING PHYSICIAN.  Your oxygen lever is great at 94-95% You can send the oxygen canisters back You are doing this well because you are not smoking Keep up the good work Return in 2 months Labs in 1 month  Thank you for choosing [place] at [facility] to provide your oncology and hematology care.  To afford each patient quality time with our provider, please arrive at least 15 minutes before your scheduled appointment time.    You need to re-schedule your appointment should you arrive 10 or more minutes late.  We strive to give you quality time with our providers, and arriving late affects you and other patients whose appointments are after yours.  Also, if you no show three or more times for appointments you may be dismissed from the clinic at the providers discretion.     Again, thank you for choosing [facility].  Our hope is that these requests will decrease the amount of time that you wait before being seen by our physicians.       _____________________________________________________________  Should you have questions after your visit to [facility], please contact our office at [phone] between the hours of 8:30 a.m. and 4:30 p.m.  Voicemails left after 4:30 p.m. will not be returned until the following business day.  For prescription refill requests, have your pharmacy contact our office.

## 2014-09-02 NOTE — Progress Notes (Signed)
[place] at [place] NOTE  DIAGNOSIS:  Polycythemia  Hemoglobin of 22.8, HCT 67 of 08/03/14 COPD Tobacco abuse  Negative for JAK2 V617F mutation Negative for JAK2 EXON 12 Negative for MPL and CALR mutation analysis   HISTORY OF PRESENTING ILLNESS:  Brian Parsons 64 y.o. male is here because of polycythemia. He was admitted to [facility] on 08/03/2014 for a COPD exacerbation. During his admission he was noted to have polycythemia.  He has been smoking 2 cigarettes daily. He continues with nicotine patches. He states O2 was delivered to his home but " I don't need it" He denies feeling SOB since stopping his smoking. He has never had a sleep study and admits to snoring. He states he does not think he can sleep in a bed but his own.   He says he feels great and his energy levels are higher since he "had the blood pulled off".   MEDICAL HISTORY:  Past Medical History  Diagnosis Date  . Hypertension   . COPD (chronic obstructive pulmonary disease)   . Polycythemia     SURGICAL HISTORY: History reviewed. No pertinent past surgical history.  SOCIAL HISTORY: History   Social History  . Marital Status: Divorced    Spouse Name: N/A  . Number of Children: N/A  . Years of Education: N/A   Occupational History  . Not on file.   Social History Main Topics  . Smoking status: Current Every Day Smoker -- 1.00 packs/day for 50 years    Types: Cigarettes  . Smokeless tobacco: Not on file  . Alcohol Use: No     Comment: no etoh for 7 years  . Drug Use: No  . Sexual Activity: Yes   Other Topics Concern  . Not on file   Social History Narrative  Two son grown adult 3 grandchildren  Smoke started at 33, 1.5-2 packs a day to age 43. He wears nicotine patch. 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 He divorced and currently engaged. Born [place], [place]: hunting fishing  softball   FAMILY HISTORY: Family History  Problem Relation Age of Onset  . Stroke Mother   . Emphysema Father   Mother 35 death stroke blood clot in harm gangrene Dad 89 brown lung One living brother One died at age of 68 due to a motorcycle accident One died at age of 98 possible brain aneurysm  indicated that his mother is deceased. He indicated that his father is deceased. He indicated that both of his brothers are deceased.   ALLERGIES:  is allergic to amoxicillin and penicillins.  MEDICATIONS:  Current Outpatient Prescriptions  Medication Sig Dispense Refill  . Fluticasone-Salmeterol (ADVAIR DISKUS) 250-50 MCG/DOSE AEPB Inhale 1 puff into the lungs 2 (two) times daily. 60 each 1  . furosemide (LASIX) 20 MG tablet Take 1 tablet (20 mg total) by mouth daily. (Patient taking differently: Take 40 mg by mouth daily. ) 30 tablet 1  . hydrOXYzine (ATARAX/VISTARIL) 25 MG tablet Take 1 tablet (25 mg total) by mouth 3 (three) times daily as needed for anxiety. 30 tablet 0  . ipratropium-albuterol (DUONEB) 0.5-2.5 (3) MG/3ML SOLN Take 3 mLs by nebulization every 6 (six) hours. 360 mL 1  . lisinopril (PRINIVIL,ZESTRIL) 20 MG tablet Take 0.5 tablets (10 mg total) by mouth daily. 30 tablet 1  . Magnesium Hydroxide (MAGNESIA PO) Take 1 tablet by mouth daily.    Marland Kitchen  nicotine (NICODERM CQ - DOSED IN MG/24 HOURS) 21 mg/24hr patch Place 1 patch (21 mg total) onto the skin daily. 28 patch 0  . guaiFENesin (MUCINEX) 600 MG 12 hr tablet Take 1 tablet (600 mg total) by mouth 2 (two) times daily. (Patient not taking: Reported on 08/19/2014) 20 tablet 0  . levofloxacin (LEVAQUIN) 500 MG tablet Take 1 tablet (500 mg total) by mouth daily. (Patient not taking: Reported on 08/19/2014) 3 tablet 0  . predniSONE (DELTASONE) 10 MG tablet Take 60mg  po daily for 2 days then 40mg  po daily for 2 days then 30mg  po daily for 2 days then 20mg  po daily for 2 days then 10mg  po daily for 2 days then stop (Patient not taking:  Reported on 08/19/2014) 32 tablet 0   No current facility-administered medications for this visit.    Review of Systems  HENT: Negative for ear pain. Negative for malaise/fatigue.  Eyes: Negative for blurred vision and double vision.  Cardiovascular: Negative for chest pain and leg swelling.  Gastrointestinal: Negative for heartburn, abdominal pain and constipation.  Musculoskeletal: Negative for joint pain.  Neurological: Negative for seizures.  Psychiatric/Behavioral: Negative.   14 point ROS was done and is otherwise as detailed above or in HPI   PHYSICAL EXAMINATION ECOG PERFORMANCE STATUS: 1 - Symptomatic but completely ambulatory  Filed Vitals:   09/02/14 1000  BP: 144/80  Pulse: 75  Temp: 98.4 F (36.9 C)  Resp: 18   Filed Weights   09/02/14 1000  Weight: 233 lb 14.4 oz (106.096 kg)     Physical Exam  Constitutional: He is oriented to person, place, and time and well-developed, well-nourished, and in no distress.  HENT:  Head: Normocephalic and atraumatic.  Nose: Nose normal.  Mouth/Throat: Oropharynx is clear and moist. No oropharyngeal exudate.      Edentulous. Oropharyngeal erythema, no legions observed.  Eyes: Conjunctivae and EOM are normal. Pupils are equal, round, and reactive to light. Right eye exhibits no discharge. Left eye exhibits no discharge. No scleral icterus.  Neck: Normal range of motion. Neck supple. No tracheal deviation present. No thyromegaly present.  Cardiovascular: Normal rate, regular rhythm and normal heart sounds.  Exam reveals no gallop and no friction rub.   No murmur heard.  Pulmonary/Chest: Effort normal and breath sounds normal. He has no wheezes. He has no rales.   Abdominal: Soft. Bowel sounds are normal. He exhibits no distension and no mass. There is no tenderness. There is no rebound and no guarding.  Musculoskeletal: Normal range of motion. He exhibits no edema.  Lymphadenopathy:    He has no cervical adenopathy.   Neurological: He is alert and oriented to person, place, and time. He has normal reflexes. No cranial nerve deficit. Gait normal. Coordination normal.  Skin: Skin is warm and dry. No rash noted.   Psychiatric: Mood, memory, affect and judgment normal.  Nursing note and vitals reviewed.     LABORATORY DATA:  I have reviewed the data as listed Lab Results  Component Value Date   WBC 11.3* 08/19/2014   HGB 18.9* 08/19/2014   HCT 60.5* 08/19/2014   MCV 94.7 08/19/2014   PLT 161 08/19/2014    RADIOGRAPHIC STUDIES: I have personally reviewed the radiological images as listed and agreed with the findings in the report. CLINICAL DATA: Hypoxia, chest pain, shortness breath x2 weeks  EXAM: CT ANGIOGRAPHY CHEST WITH CONTRAST  TECHNIQUE: Multidetector CT imaging of the chest was performed using the standard protocol during bolus administration of  intravenous contrast. Multiplanar CT image reconstructions and MIPs were obtained to evaluate the vascular anatomy.  CONTRAST: 162mL OMNIPAQUE IOHEXOL 350 MG/ML SOLN  COMPARISON: None.  FINDINGS: Left arm contrast injection. The SVC is patent. There is mild four-chamber cardiac enlargement. Central pulmonary arteries are dilated. Satisfactory opacification of pulmonary arteries noted, and there is no evidence of pulmonary emboli. Patient motion degrades some of the images through the lung bases. Patent superior and inferior pulmonary veins bilaterally. Adequate contrast opacification of the thoracic aorta with no evidence of dissection, aneurysm, or stenosis. There is classic 3-vessel brachiocephalic arch anatomy without proximal stenosis. No significant atheromatous plaque.  No pleural or pericardial effusion. Subcentimeter prevascular and precarinal lymph nodes. No hilar adenopathy. Mild subsegmental atelectasis posteriorly at both lung bases. Mild emphysematous changes in the lung apices. Peribronchial thickening most  marked in the right lower lobe. Lungs otherwise clear. Thoracic spine and sternum intact.  Visualized portions of upper abdomen unremarkable.  Review of the MIP images confirms the above findings.  IMPRESSION: 1. Negative for acute PE or thoracic aortic dissection. 2. Emphysema and peribronchial thickening suggesting bronchitis.   Electronically Signed  By: Lucrezia Europe M.D.  On: 08/05/2014 10:44  ASSESSMENT & PLAN:  Polycythemia Tobacco abuse  COPD Obesity  I advised the patient that based upon his lab work I suspect his polycythemia is secondary to his tobacco abuse, COPD. We readdressed a sleep study and he is open to considering it.   We rechecked his O2 sats which are 93% with ambulation. We can discontinue his home O2. He is not using it and feels he does not need it.  I emphasized the importance of smoking cessation, health benefits associated with it. He understands this but again will not commit to quitting. Continues to use the nicotine patches however and is down to 2 cigarettes daily. I advised him I would refill his nicotine patches if he chooses. He will call us in the next week if he wishes for refill.  We will recheck blood counts in 4 weeks. Follow up in 2 months, with repeat labs and an office visit. Smoking cessation will continue to be emphasized. will discuss blood work at this visit.  Based upon his B12 levels have suggested  to add a sublingual B12 supplement to his medication routine.   Orders Placed This Encounter  Procedures  . CBC with Differential    Standing Status: Standing     Number of Occurrences: 6     Standing Expiration Date: 09/02/2015  . Ferritin    Standing Status: Standing     Number of Occurrences: 6     Standing Expiration Date: 09/02/2015    All questions were answered. The patient knows to call the clinic with any problems, questions or concerns.  This note was electronically signed.    This document serves as a record  of services personally performed by Ancil Linsey, MD. It was created on her behalf by Arlyce Harman, a trained medical scribe. The creation of this record is based on the scribe's personal observations and the provider's statements to them. This document has been checked and approved by the attending provider.  I have reviewed the above documentation for accuracy and completeness, and I agree with the above.  Molli Hazard, MD

## 2014-09-02 NOTE — Progress Notes (Signed)
Brian Parsons presents today for phlebotomy per MD orders. HGB/HCT:n/a Phlebotomy procedure started at 1128 and ended at 1138 22 ounces removed. Patient tolerated procedure well. IV needle removed intact.

## 2014-09-03 ENCOUNTER — Telehealth (HOSPITAL_COMMUNITY): Payer: Self-pay

## 2014-09-03 ENCOUNTER — Encounter (HOSPITAL_COMMUNITY): Payer: Medicare Other

## 2014-09-03 NOTE — Telephone Encounter (Signed)
A user error has taken place: encounter opened in error, closed for administrative reasons.

## 2014-09-17 ENCOUNTER — Other Ambulatory Visit (HOSPITAL_COMMUNITY): Payer: Self-pay

## 2014-09-30 ENCOUNTER — Encounter (HOSPITAL_COMMUNITY): Payer: Medicare Other | Attending: Hematology & Oncology

## 2014-09-30 DIAGNOSIS — D751 Secondary polycythemia: Secondary | ICD-10-CM | POA: Diagnosis not present

## 2014-09-30 DIAGNOSIS — D7581 Myelofibrosis: Secondary | ICD-10-CM

## 2014-09-30 DIAGNOSIS — Z720 Tobacco use: Secondary | ICD-10-CM | POA: Insufficient documentation

## 2014-09-30 DIAGNOSIS — R0902 Hypoxemia: Secondary | ICD-10-CM

## 2014-09-30 DIAGNOSIS — E669 Obesity, unspecified: Secondary | ICD-10-CM | POA: Diagnosis not present

## 2014-09-30 DIAGNOSIS — J449 Chronic obstructive pulmonary disease, unspecified: Secondary | ICD-10-CM | POA: Insufficient documentation

## 2014-09-30 DIAGNOSIS — I10 Essential (primary) hypertension: Secondary | ICD-10-CM | POA: Insufficient documentation

## 2014-09-30 LAB — CBC WITH DIFFERENTIAL/PLATELET
BASOS PCT: 0 % (ref 0–1)
Basophils Absolute: 0 10*3/uL (ref 0.0–0.1)
EOS ABS: 0.3 10*3/uL (ref 0.0–0.7)
EOS PCT: 4 % (ref 0–5)
HEMATOCRIT: 53.3 % — AB (ref 39.0–52.0)
Hemoglobin: 17.9 g/dL — ABNORMAL HIGH (ref 13.0–17.0)
Lymphocytes Relative: 35 % (ref 12–46)
Lymphs Abs: 2.7 10*3/uL (ref 0.7–4.0)
MCH: 31 pg (ref 26.0–34.0)
MCHC: 33.6 g/dL (ref 30.0–36.0)
MCV: 92.4 fL (ref 78.0–100.0)
MONO ABS: 0.5 10*3/uL (ref 0.1–1.0)
Monocytes Relative: 7 % (ref 3–12)
NEUTROS PCT: 54 % (ref 43–77)
Neutro Abs: 4.2 10*3/uL (ref 1.7–7.7)
Platelets: 144 10*3/uL — ABNORMAL LOW (ref 150–400)
RBC: 5.77 MIL/uL (ref 4.22–5.81)
RDW: 14 % (ref 11.5–15.5)
WBC: 7.8 10*3/uL (ref 4.0–10.5)

## 2014-09-30 LAB — FERRITIN: Ferritin: 25 ng/mL (ref 24–336)

## 2014-09-30 NOTE — Progress Notes (Signed)
Labs drawn

## 2014-10-09 ENCOUNTER — Encounter (HOSPITAL_COMMUNITY)
Admission: RE | Admit: 2014-10-09 | Discharge: 2014-10-09 | Disposition: A | Discharge status: Home / Self Care | Payer: Medicare Other | Source: Ambulatory Visit | Attending: Hematology & Oncology | Admitting: Hematology & Oncology

## 2014-10-09 ENCOUNTER — Encounter (HOSPITAL_COMMUNITY): Payer: Self-pay

## 2014-10-09 DIAGNOSIS — R0902 Hypoxemia: Secondary | ICD-10-CM | POA: Diagnosis present

## 2014-10-09 DIAGNOSIS — D751 Secondary polycythemia: Secondary | ICD-10-CM | POA: Insufficient documentation

## 2014-10-09 NOTE — Progress Notes (Signed)
Brian Parsons presents today for phlebotomy per MD orders.  Phlebotomy procedure started at 0941 and ended at 0950 18 ounces removed. Patient tolerated procedure well. IV needle removed intact.

## 2014-10-28 ENCOUNTER — Other Ambulatory Visit (HOSPITAL_COMMUNITY): Payer: Self-pay | Admitting: Hematology & Oncology

## 2014-11-02 ENCOUNTER — Encounter (HOSPITAL_COMMUNITY): Payer: Self-pay | Admitting: Hematology & Oncology

## 2014-11-02 ENCOUNTER — Encounter (HOSPITAL_COMMUNITY): Payer: Medicare Other | Attending: Hematology & Oncology | Admitting: Hematology & Oncology

## 2014-11-02 ENCOUNTER — Encounter (HOSPITAL_BASED_OUTPATIENT_CLINIC_OR_DEPARTMENT_OTHER): Payer: Medicare Other

## 2014-11-02 VITALS — BP 110/60 | HR 84 | Temp 98.2°F | Resp 20 | Wt 239.1 lb

## 2014-11-02 DIAGNOSIS — E669 Obesity, unspecified: Secondary | ICD-10-CM

## 2014-11-02 DIAGNOSIS — D751 Secondary polycythemia: Secondary | ICD-10-CM

## 2014-11-02 DIAGNOSIS — R0902 Hypoxemia: Secondary | ICD-10-CM

## 2014-11-02 DIAGNOSIS — J449 Chronic obstructive pulmonary disease, unspecified: Secondary | ICD-10-CM

## 2014-11-02 DIAGNOSIS — M25512 Pain in left shoulder: Secondary | ICD-10-CM | POA: Diagnosis not present

## 2014-11-02 DIAGNOSIS — Z720 Tobacco use: Secondary | ICD-10-CM | POA: Diagnosis not present

## 2014-11-02 DIAGNOSIS — I10 Essential (primary) hypertension: Secondary | ICD-10-CM | POA: Insufficient documentation

## 2014-11-02 DIAGNOSIS — Z79899 Other long term (current) drug therapy: Secondary | ICD-10-CM | POA: Insufficient documentation

## 2014-11-02 LAB — CBC WITH DIFFERENTIAL/PLATELET
Basophils Absolute: 0 10*3/uL (ref 0.0–0.1)
Basophils Relative: 0 % (ref 0–1)
Eosinophils Absolute: 0.2 10*3/uL (ref 0.0–0.7)
Eosinophils Relative: 3 % (ref 0–5)
HEMATOCRIT: 53.9 % — AB (ref 39.0–52.0)
Hemoglobin: 17.7 g/dL — ABNORMAL HIGH (ref 13.0–17.0)
LYMPHS ABS: 2.9 10*3/uL (ref 0.7–4.0)
Lymphocytes Relative: 38 % (ref 12–46)
MCH: 30.3 pg (ref 26.0–34.0)
MCHC: 32.8 g/dL (ref 30.0–36.0)
MCV: 92.1 fL (ref 78.0–100.0)
MONOS PCT: 7 % (ref 3–12)
Monocytes Absolute: 0.5 10*3/uL (ref 0.1–1.0)
NEUTROS ABS: 4.1 10*3/uL (ref 1.7–7.7)
NEUTROS PCT: 52 % (ref 43–77)
PLATELETS: 160 10*3/uL (ref 150–400)
RBC: 5.85 MIL/uL — AB (ref 4.22–5.81)
RDW: 13.3 % (ref 11.5–15.5)
WBC: 7.7 10*3/uL (ref 4.0–10.5)

## 2014-11-02 LAB — FERRITIN: FERRITIN: 12 ng/mL — AB (ref 24–336)

## 2014-11-02 NOTE — Progress Notes (Signed)
Labs drawn

## 2014-11-02 NOTE — Progress Notes (Signed)
[facility] at [place] NOTE  DIAGNOSIS:  Polycythemia  Hemoglobin of 22.8, HCT 67 of 08/03/14 COPD Tobacco abuse  Negative for JAK2 V617F mutation Negative for JAK2 EXON 12 Negative for MPL and CALR mutation analysis   HISTORY OF PRESENTING ILLNESS:  Brian Parsons 64 y.o. male is here because of polycythemia. He was admitted to [facility] on 08/03/2014 for a COPD exacerbation. During his admission he was noted to have polycythemia.  The patient present today alone and says that overall he feels better.   He now smokes about 10 cigarettes a week, this is a dramatic decrease.  He says that he has an 66 year old at home keeping him active. He also notes that decreasing his smoking has improved his breathing and energy.  His fiance is now also working on quitting. He complains of having left shoulder pain that hurts when he moves and raises his arm The patient likes when he has phlebotomy, he says that it gives him a burst of energy. His appetite is well.  He denies finding any lumps or bumps. No change in appetite. He is pleased with his progress thus far.  MEDICAL HISTORY:  Past Medical History  Diagnosis Date  . Hypertension   . COPD (chronic obstructive pulmonary disease)   . Polycythemia     SURGICAL HISTORY: History reviewed. No pertinent past surgical history.  SOCIAL HISTORY: History   Social History  . Marital Status: Divorced    Spouse Name: N/A  . Number of Children: N/A  . Years of Education: N/A   Occupational History  . Not on file.   Social History Main Topics  . Smoking status: Current Every Day Smoker -- 1.00 packs/day for 50 years    Types: Cigarettes  . Smokeless tobacco: Not on file  . Alcohol Use: No     Comment: no etoh for 7 years  . Drug Use: No  . Sexual Activity: Yes   Other Topics Concern  . Not on file   Social History Narrative  Two son grown adult 3 grandchildren  Smoke started at 16, 1.5-2 packs a day to  age 60. He wears nicotine patch. 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 He divorced and currently engaged. Born [place], [place]: hunting fishing softball   FAMILY HISTORY: Family History  Problem Relation Age of Onset  . Stroke Mother   . Emphysema Father   Mother 37 death stroke blood clot in harm gangrene Dad 24 brown lung One living brother One died at age of 55 due to a motorcycle accident One died at age of 62 possible brain aneurysm  indicated that his mother is deceased. He indicated that his father is deceased. He indicated that both of his brothers are deceased.   ALLERGIES:  is allergic to amoxicillin and penicillins.  MEDICATIONS:  Current Outpatient Prescriptions  Medication Sig Dispense Refill  . ADVAIR DISKUS 250-50 MCG/DOSE AEPB USE 1 INHALATION BY MOUTH EVERY 12 HOURS. <<RINSE MOUTH AFTER USE>>. 60 each 3  . furosemide (LASIX) 20 MG tablet Take 1 tablet (20 mg total) by mouth daily. (Patient taking differently: Take 40 mg by mouth daily. ) 30 tablet 1  . hydrOXYzine (ATARAX/VISTARIL) 25 MG tablet Take 1 tablet (25 mg total) by mouth 3 (three) times daily as needed for anxiety. 30 tablet 0  . ipratropium-albuterol (DUONEB) 0.5-2.5 (3) MG/3ML SOLN Take 3 mLs by nebulization every  6 (six) hours. 360 mL 1  . lisinopril (PRINIVIL,ZESTRIL) 20 MG tablet Take 0.5 tablets (10 mg total) by mouth daily. 30 tablet 1  . Magnesium Hydroxide (MAGNESIA PO) Take 1 tablet by mouth daily.    . nicotine (NICODERM CQ - DOSED IN MG/24 HOURS) 21 mg/24hr patch Place 1 patch (21 mg total) onto the skin daily. 28 patch 0  . vitamin B-12 (CYANOCOBALAMIN) 1000 MCG tablet Take 1,000 mcg by mouth daily.    Marland Kitchen guaiFENesin (MUCINEX) 600 MG 12 hr tablet Take 1 tablet (600 mg total) by mouth 2 (two) times daily. (Patient not taking: Reported on 08/19/2014) 20 tablet 0  . predniSONE (DELTASONE) 10 MG tablet Take 60mg  po daily for 2 days then  40mg  po daily for 2 days then 30mg  po daily for 2 days then 20mg  po daily for 2 days then 10mg  po daily for 2 days then stop (Patient not taking: Reported on 08/19/2014) 32 tablet 0   No current facility-administered medications for this visit.    Review of Systems  HENT: Negative for ear pain. Negative for malaise/fatigue.  Eyes: Negative for blurred vision and double vision.  Cardiovascular: Negative for chest pain and leg swelling.  Gastrointestinal: Negative for heartburn, abdominal pain and constipation.  Musculoskeletal: Negative for joint pain.  Neurological: Negative for seizures.  Psychiatric/Behavioral: Negative.   14 point ROS was done and is otherwise as detailed above or in HPI   PHYSICAL EXAMINATION ECOG PERFORMANCE STATUS: 1 - Symptomatic but completely ambulatory  Filed Vitals:   11/02/14 1207  BP: 110/60  Pulse: 84  Temp: 98.2 F (36.8 C)  Resp: 20   Filed Weights   11/02/14 1207  Weight: 239 lb 1.6 oz (108.455 kg)     Physical Exam  Constitutional: He is oriented to person, place, and time and well-developed, well-nourished, and in no distress.  HENT:  Head: Normocephalic and atraumatic.  Nose: Nose normal.  Mouth/Throat: Oropharynx is clear and moist. No oropharyngeal exudate.      Edentulous. Oropharyngeal erythema diffuse, no legions observed.  Eyes: Conjunctivae and EOM are normal. Pupils are equal, round, and reactive to light. Right eye exhibits no discharge. Left eye exhibits no discharge. No scleral icterus.  Neck: Normal range of motion. Neck supple. No tracheal deviation present. No thyromegaly present.  Cardiovascular: Normal rate, regular rhythm and normal heart sounds.  Exam reveals no gallop and no friction rub.   No murmur heard.  Pulmonary/Chest: Effort normal and breath sounds normal. He has no wheezes. He has no rales.   Abdominal: Soft. Bowel sounds are normal. He exhibits no distension and no mass. There is no tenderness. There is no  rebound and no guarding.  Musculoskeletal: Normal range of motion. He exhibits no edema.  Lymphadenopathy:    He has no cervical adenopathy.  Neurological: He is alert and oriented to person, place, and time. He has normal reflexes. No cranial nerve deficit. Gait normal. Coordination normal.  Skin: Skin is warm and dry. No rash noted.   Psychiatric: Mood, memory, affect and judgment normal.  Nursing note and vitals reviewed.   LABORATORY DATA:  I have reviewed the data as listed Lab Results  Component Value Date   WBC 7.7 11/02/2014   HGB 17.7* 11/02/2014   HCT 53.9* 11/02/2014   MCV 92.1 11/02/2014   PLT 160 11/02/2014    RADIOGRAPHIC STUDIES: I have personally reviewed the radiological images as listed and agreed with the findings in the report. CLINICAL DATA: Hypoxia,  chest pain, shortness breath x2 weeks  EXAM: CT ANGIOGRAPHY CHEST WITH CONTRAST  TECHNIQUE: Multidetector CT imaging of the chest was performed using the standard protocol during bolus administration of intravenous contrast. Multiplanar CT image reconstructions and MIPs were obtained to evaluate the vascular anatomy.  CONTRAST: 160mL OMNIPAQUE IOHEXOL 350 MG/ML SOLN  COMPARISON: None.  FINDINGS: Left arm contrast injection. The SVC is patent. There is mild four-chamber cardiac enlargement. Central pulmonary arteries are dilated. Satisfactory opacification of pulmonary arteries noted, and there is no evidence of pulmonary emboli. Patient motion degrades some of the images through the lung bases. Patent superior and inferior pulmonary veins bilaterally. Adequate contrast opacification of the thoracic aorta with no evidence of dissection, aneurysm, or stenosis. There is classic 3-vessel brachiocephalic arch anatomy without proximal stenosis. No significant atheromatous plaque.  No pleural or pericardial effusion. Subcentimeter prevascular and precarinal lymph nodes. No hilar adenopathy. Mild  subsegmental atelectasis posteriorly at both lung bases. Mild emphysematous changes in the lung apices. Peribronchial thickening most marked in the right lower lobe. Lungs otherwise clear. Thoracic spine and sternum intact.  Visualized portions of upper abdomen unremarkable.  Review of the MIP images confirms the above findings.  IMPRESSION: 1. Negative for acute PE or thoracic aortic dissection. 2. Emphysema and peribronchial thickening suggesting bronchitis.   Electronically Signed  By: Lucrezia Europe M.D.  On: 08/05/2014 10:44  ASSESSMENT & PLAN:  Polycythemia Tobacco abuse, patient now down to 10 cigarettes weekly COPD Obesity  He has done remarkably well with decreasing his smoking. He is now down to 10 cigarettes a week. He notes that it also helps that his fianc is also trying to quit. He has noticed an improvement in his energy and breathing.  We discussed a goal of keeping his hematocrit at 50. I have addressed with him that in secondary polycythemia, there are multiple opinions on how to manage the polycythemia. He currently wants to continue with intermittent phlebotomy.  Orders Placed This Encounter  Procedures  . CBC with Differential    Standing Status: Future     Number of Occurrences:      Standing Expiration Date: 11/02/2015  . Ferritin    Standing Status: Future     Number of Occurrences:      Standing Expiration Date: 11/02/2015   We will plan on seeing him back in 3 months with labs. We will set him up for phlebotomy 1. I continue to encourage completely stopping his smoking. He feels that he will have quit by his next follow-up.  All questions were answered. The patient knows to call the clinic with any problems, questions or concerns.  This note was electronically signed.    This document serves as a record of services personally performed by Ancil Linsey, MD. It was created on her behalf by Janace Hoard, a trained medical scribe. The  creation of this record is based on the scribe's personal observations and the provider's statements to them. This document has been checked and approved by the attending provider.  I have reviewed the above documentation for accuracy and completeness, and I agree with the above.  Kelby Fam. Whitney Muse, MD

## 2014-11-02 NOTE — Patient Instructions (Signed)
[place] at [facility] Discharge Instructions  RECOMMENDATIONS MADE BY THE CONSULTANT AND ANY TEST RESULTS WILL BE SENT TO YOUR REFERRING PHYSICIAN.  Exam completed by Dr Whitney Muse today Return in 3 months with lab work Phlebotomy x1 Please call the clinic if you have any questions or concerns  Thank you for choosing [place] at [facility] to provide your oncology and hematology care.  To afford each patient quality time with our provider, please arrive at least 15 minutes before your scheduled appointment time.    You need to re-schedule your appointment should you arrive 10 or more minutes late.  We strive to give you quality time with our providers, and arriving late affects you and other patients whose appointments are after yours.  Also, if you no show three or more times for appointments you may be dismissed from the clinic at the providers discretion.     Again, thank you for choosing [facility]  Our hope is that these requests will decrease the amount of time that you wait before being seen by our physicians.       _____________________________________________________________  Should you have questions after your visit to [facility], please contact our office at [phone] between the hours of 8:30 a.m. and 4:30 p.m.  Voicemails left after 4:30 p.m. will not be returned until the following business day.  For prescription refill requests, have your pharmacy contact our office.

## 2014-11-03 ENCOUNTER — Encounter (HOSPITAL_COMMUNITY)
Admission: RE | Admit: 2014-11-03 | Discharge: 2014-11-03 | Disposition: A | Discharge status: Home / Self Care | Payer: Medicare Other | Source: Ambulatory Visit | Attending: Hematology & Oncology | Admitting: Hematology & Oncology

## 2014-11-03 DIAGNOSIS — D751 Secondary polycythemia: Secondary | ICD-10-CM | POA: Diagnosis not present

## 2014-11-13 ENCOUNTER — Telehealth (HOSPITAL_COMMUNITY): Payer: Self-pay

## 2014-11-13 NOTE — Telephone Encounter (Signed)
Call from patient with complaints of "slight increase" in lower extremity edema.  States "I've been out in the heat quite a bit and wondered if that could cause the increase.  Don't want to end up back in the hospital."  Has been taking his lasix daily, has not been elevating feet when sitting.  Encouraged to elevate feet and legs whenever he sits, and if swelling worsens he should contact/see his primary care physician.  Stated he would do so.

## 2015-02-02 ENCOUNTER — Encounter (HOSPITAL_COMMUNITY): Payer: Self-pay | Admitting: Hematology & Oncology

## 2015-02-02 ENCOUNTER — Encounter (HOSPITAL_COMMUNITY): Payer: Medicare Other | Attending: Hematology & Oncology | Admitting: Hematology & Oncology

## 2015-02-02 ENCOUNTER — Encounter (HOSPITAL_BASED_OUTPATIENT_CLINIC_OR_DEPARTMENT_OTHER): Payer: Medicare Other

## 2015-02-02 VITALS — BP 139/74 | HR 66 | Temp 98.5°F | Resp 20 | Wt 241.2 lb

## 2015-02-02 DIAGNOSIS — J449 Chronic obstructive pulmonary disease, unspecified: Secondary | ICD-10-CM

## 2015-02-02 DIAGNOSIS — Z720 Tobacco use: Secondary | ICD-10-CM | POA: Diagnosis not present

## 2015-02-02 DIAGNOSIS — D751 Secondary polycythemia: Secondary | ICD-10-CM | POA: Diagnosis present

## 2015-02-02 DIAGNOSIS — R0902 Hypoxemia: Secondary | ICD-10-CM

## 2015-02-02 DIAGNOSIS — E669 Obesity, unspecified: Secondary | ICD-10-CM

## 2015-02-02 LAB — CBC WITH DIFFERENTIAL/PLATELET
BASOS ABS: 0 10*3/uL (ref 0.0–0.1)
BASOS PCT: 0 %
EOS ABS: 0 10*3/uL (ref 0.0–0.7)
EOS PCT: 0 %
HCT: 55.6 % — ABNORMAL HIGH (ref 39.0–52.0)
HEMOGLOBIN: 17.6 g/dL — AB (ref 13.0–17.0)
Lymphocytes Relative: 13 %
Lymphs Abs: 1.2 10*3/uL (ref 0.7–4.0)
MCH: 27.9 pg (ref 26.0–34.0)
MCHC: 31.7 g/dL (ref 30.0–36.0)
MCV: 88.3 fL (ref 78.0–100.0)
Monocytes Absolute: 0.2 10*3/uL (ref 0.1–1.0)
Monocytes Relative: 2 %
NEUTROS PCT: 85 %
Neutro Abs: 7.5 10*3/uL (ref 1.7–7.7)
Platelets: 164 10*3/uL (ref 150–400)
RBC: 6.3 MIL/uL — AB (ref 4.22–5.81)
RDW: 13.5 % (ref 11.5–15.5)
WBC: 8.9 10*3/uL (ref 4.0–10.5)

## 2015-02-02 LAB — FERRITIN: Ferritin: 14 ng/mL — ABNORMAL LOW (ref 24–336)

## 2015-02-02 NOTE — Progress Notes (Signed)
[name] NOTE  DIAGNOSIS:  Polycythemia  Hemoglobin of 22.8, HCT 67 of 08/03/14 COPD Tobacco abuse  Negative for JAK2 V617F mutation Negative for JAK2 EXON 12 Negative for MPL and CALR mutation analysis   HISTORY OF PRESENTING ILLNESS:  Brian Parsons 64 y.o. male is here because of polycythemia. He was admitted to [facility] on 08/03/2014 for a COPD exacerbation. During his admission he was noted to have polycythemia.  The patient notes that he is down to about 3 cigarettes a day.  He states that his 48 yo grandson and fiancee motivate him to quit.  He has been walking, exercising, and fishing.  He states that he feels much better.  He has not been phlebotomized in about 3 mo.  He notes that he feels more energized afterward.  He does not take flu shots.    MEDICAL HISTORY:  Past Medical History  Diagnosis Date  . Hypertension   . COPD (chronic obstructive pulmonary disease) ([place])   . Polycythemia     SURGICAL HISTORY: History reviewed. No pertinent past surgical history.  SOCIAL HISTORY: Social History   Social History  . Marital Status: Divorced    Spouse Name: N/A  . Number of Children: N/A  . Years of Education: N/A   Occupational History  . Not on file.   Social History Main Topics  . Smoking status: Current Every Day Smoker -- 1.00 packs/day for 50 years    Types: Cigarettes  . Smokeless tobacco: Not on file  . Alcohol Use: No     Comment: no etoh for 7 years  . Drug Use: No  . Sexual Activity: Yes   Other Topics Concern  . Not on file   Social History Narrative  Two son grown adult 3 grandchildren  Smoke started at 58, 1.5-2 packs a day to age 64. He wears nicotine patch. 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 He divorced and currently engaged. [place]: hunting fishing softball   FAMILY HISTORY: Family History  Problem Relation  Age of Onset  . Stroke Mother   . Emphysema Father   Mother 57 death stroke blood clot in harm gangrene Dad 55 brown lung One living brother One died at age of 21 due to a motorcycle accident One died at age of 6 possible brain aneurysm  indicated that his mother is deceased. He indicated that his father is deceased. He indicated that both of his brothers are deceased.   ALLERGIES:  is allergic to amoxicillin and penicillins.  MEDICATIONS:  Current Outpatient Prescriptions  Medication Sig Dispense Refill  . ADVAIR DISKUS 250-50 MCG/DOSE AEPB USE 1 INHALATION BY MOUTH EVERY 12 HOURS. <<RINSE MOUTH AFTER USE>>. 60 each 3  . furosemide (LASIX) 20 MG tablet Take 1 tablet (20 mg total) by mouth daily. (Patient taking differently: Take 40 mg by mouth daily. ) 30 tablet 1  . hydrOXYzine (ATARAX/VISTARIL) 25 MG tablet Take 1 tablet (25 mg total) by mouth 3 (three) times daily as needed for anxiety. 30 tablet 0  . ipratropium-albuterol (DUONEB) 0.5-2.5 (3) MG/3ML SOLN Take 3 mLs by nebulization every 6 (six) hours. 360 mL 1  . lisinopril (PRINIVIL,ZESTRIL) 20 MG tablet Take 0.5 tablets (10 mg total) by mouth daily. 30 tablet 1  . Magnesium Hydroxide (MAGNESIA PO) Take 1 tablet by mouth daily.    . predniSONE (DELTASONE) 10 MG tablet Take 60mg   po daily for 2 days then 40mg  po daily for 2 days then 30mg  po daily for 2 days then 20mg  po daily for 2 days then 10mg  po daily for 2 days then stop 32 tablet 0  . predniSONE (DELTASONE) 20 MG tablet Take 20 mg by mouth daily.    . vitamin B-12 (CYANOCOBALAMIN) 1000 MCG tablet Take 1,000 mcg by mouth daily.    Marland Kitchen guaiFENesin (MUCINEX) 600 MG 12 hr tablet Take 1 tablet (600 mg total) by mouth 2 (two) times daily. (Patient not taking: Reported on 08/19/2014) 20 tablet 0  . nicotine (NICODERM CQ - DOSED IN MG/24 HOURS) 21 mg/24hr patch Place 1 patch (21 mg total) onto the skin daily. (Patient not taking: Reported on 02/02/2015) 28 patch 0   No current  facility-administered medications for this visit.    Review of Systems  HENT: Negative for ear pain. Negative for malaise/fatigue.  Eyes: Negative for blurred vision and double vision.  Cardiovascular: Negative for chest pain and leg swelling.  Gastrointestinal: Negative for heartburn, abdominal pain and constipation.  Musculoskeletal: Negative for joint pain.  Neurological: Negative for seizures.  Psychiatric/Behavioral: Negative.   14 point ROS was done and is otherwise as detailed above or in HPI   PHYSICAL EXAMINATION ECOG PERFORMANCE STATUS: 1 - Symptomatic but completely ambulatory  Filed Vitals:   02/02/15 1103  BP: 139/74  Pulse: 66  Temp: 98.5 F (36.9 C)  Resp: 20   Filed Weights   02/02/15 1103  Weight: 241 lb 3.2 oz (109.408 kg)     Physical Exam  Constitutional: He is oriented to person, place, and time and well-developed, well-nourished, and in no distress.  HENT:  Head: Normocephalic and atraumatic.  Nose: Nose normal.  Mouth/Throat: Oropharynx is clear and moist. No oropharyngeal exudate.      Edentulous. Oropharyngeal erythema diffuse, no legions observed.  Eyes: Conjunctivae and EOM are normal. Pupils are equal, round, and reactive to light. Right eye exhibits no discharge. Left eye exhibits no discharge. No scleral icterus.  Neck: Normal range of motion. Neck supple. No tracheal deviation present. No thyromegaly present.  Cardiovascular: Normal rate, regular rhythm and normal heart sounds.  Exam reveals no gallop and no friction rub.   No murmur heard.  Pulmonary/Chest: Effort normal and breath sounds normal. He has no wheezes. He has no rales.   Abdominal: Soft. Bowel sounds are normal. He exhibits no distension and no mass. There is no tenderness. There is no rebound and no guarding.  Musculoskeletal: Normal range of motion. He exhibits no edema.  Lymphadenopathy:    He has no cervical adenopathy.  Neurological: He is alert and oriented to person,  place, and time. He has normal reflexes. No cranial nerve deficit. Gait normal. Coordination normal.  Skin: Skin is warm and dry. No rash noted.   Psychiatric: Mood, memory, affect and judgment normal.  Nursing note and vitals reviewed.   LABORATORY DATA:  I have reviewed the data as listed Lab Results  Component Value Date   WBC 8.9 02/02/2015   HGB 17.6* 02/02/2015   HCT 55.6* 02/02/2015   MCV 88.3 02/02/2015   PLT 164 02/02/2015    RADIOGRAPHIC STUDIES: I have personally reviewed the radiological images as listed and agreed with the findings in the report. CLINICAL DATA: Hypoxia, chest pain, shortness breath x2 weeks  EXAM: CT ANGIOGRAPHY CHEST WITH CONTRAST  TECHNIQUE: Multidetector CT imaging of the chest was performed using the standard protocol during bolus administration of intravenous contrast.  Multiplanar CT image reconstructions and MIPs were obtained to evaluate the vascular anatomy.  CONTRAST: 183mL OMNIPAQUE IOHEXOL 350 MG/ML SOLN  COMPARISON: None.  FINDINGS: Left arm contrast injection. The SVC is patent. There is mild four-chamber cardiac enlargement. Central pulmonary arteries are dilated. Satisfactory opacification of pulmonary arteries noted, and there is no evidence of pulmonary emboli. Patient motion degrades some of the images through the lung bases. Patent superior and inferior pulmonary veins bilaterally. Adequate contrast opacification of the thoracic aorta with no evidence of dissection, aneurysm, or stenosis. There is classic 3-vessel brachiocephalic arch anatomy without proximal stenosis. No significant atheromatous plaque.  No pleural or pericardial effusion. Subcentimeter prevascular and precarinal lymph nodes. No hilar adenopathy. Mild subsegmental atelectasis posteriorly at both lung bases. Mild emphysematous changes in the lung apices. Peribronchial thickening most marked in the right lower lobe. Lungs otherwise clear.  Thoracic spine and sternum intact.  Visualized portions of upper abdomen unremarkable.  Review of the MIP images confirms the above findings.  IMPRESSION: 1. Negative for acute PE or thoracic aortic dissection. 2. Emphysema and peribronchial thickening suggesting bronchitis.   Electronically Signed  By: Lucrezia Europe M.D.  On: 08/05/2014 10:44  ASSESSMENT & PLAN:  Polycythemia, secondary Tobacco abuse, making progress towards cessation COPD Obesity  He has done remarkably well with decreasing his smoking. He notes that it also helps that his fianc is also trying to quit. He has noticed an improvement in his energy and breathing.  We discussed a goal of keeping his hematocrit at 50. I have addressed with him that in secondary polycythemia, there are multiple opinions on how to manage the polycythemia. He currently wants to continue with intermittent phlebotomy.  We will plan on seeing him back in 3 months with labs. We will set him up for phlebotomy 1. I continue to encourage completely stopping his smoking. He feels that he will have quit by his next follow-up.  All questions were answered. The patient knows to call the clinic with any problems, questions or concerns.   Phlebotomy next week Blood checks moved out to every 2 mo   This note was electronically signed.    This document serves as a record of services personally performed by Ancil Linsey, MD. It was created on her behalf by Janace Hoard, a trained medical scribe. The creation of this record is based on the scribe's personal observations and the provider's statements to them. This document has been checked and approved by the attending provider.  I have reviewed the above documentation for accuracy and completeness, and I agree with the above.  Kelby Fam. Whitney Muse, MD

## 2015-02-02 NOTE — Progress Notes (Signed)
LABS DRAWN

## 2015-02-02 NOTE — Patient Instructions (Signed)
[place] at [facility] Discharge Instructions  RECOMMENDATIONS MADE BY THE CONSULTANT AND ANY TEST RESULTS WILL BE SENT TO YOUR REFERRING PHYSICIAN.  Exam and discussion by Dr. Whitney Muse. We will get you scheduled for phlebotomies x 2.  They will contact you with the date and times for your infusion. Labs every 2 months and office visit in 3 months.  Thank you for choosing [place] at [facility] to provide your oncology and hematology care.  To afford each patient quality time with our provider, please arrive at least 15 minutes before your scheduled appointment time.    You need to re-schedule your appointment should you arrive 10 or more minutes late.  We strive to give you quality time with our providers, and arriving late affects you and other patients whose appointments are after yours.  Also, if you no show three or more times for appointments you may be dismissed from the clinic at the providers discretion.     Again, thank you for choosing [facility].  Our hope is that these requests will decrease the amount of time that you wait before being seen by our physicians.       _____________________________________________________________  Should you have questions after your visit to [facility], please contact our office at [phone] between the hours of 8:30 a.m. and 4:30 p.m.  Voicemails left after 4:30 p.m. will not be returned until the following business day.  For prescription refill requests, have your pharmacy contact our office.

## 2015-02-05 MED ORDER — HEPARIN SOD (PORK) LOCK FLUSH 100 UNIT/ML IV SOLN
INTRAVENOUS | Status: AC
  Filled 2015-02-05: qty 5

## 2015-02-11 ENCOUNTER — Encounter (HOSPITAL_COMMUNITY)
Admission: RE | Admit: 2015-02-11 | Discharge: 2015-02-11 | Disposition: A | Discharge status: Home / Self Care | Payer: Medicare Other | Source: Ambulatory Visit | Attending: Hematology & Oncology | Admitting: Hematology & Oncology

## 2015-02-11 DIAGNOSIS — D751 Secondary polycythemia: Secondary | ICD-10-CM | POA: Diagnosis not present

## 2015-02-11 NOTE — Progress Notes (Signed)
Tolerated phlebotomy well.

## 2015-02-18 ENCOUNTER — Encounter (HOSPITAL_COMMUNITY)
Admission: RE | Admit: 2015-02-18 | Discharge: 2015-02-18 | Disposition: A | Discharge status: Home / Self Care | Payer: Medicare Other | Source: Ambulatory Visit | Attending: Hematology & Oncology | Admitting: Hematology & Oncology

## 2015-02-18 ENCOUNTER — Encounter (HOSPITAL_COMMUNITY): Payer: Self-pay

## 2015-02-18 DIAGNOSIS — J449 Chronic obstructive pulmonary disease, unspecified: Secondary | ICD-10-CM | POA: Insufficient documentation

## 2015-02-18 DIAGNOSIS — F17210 Nicotine dependence, cigarettes, uncomplicated: Secondary | ICD-10-CM | POA: Diagnosis not present

## 2015-02-18 DIAGNOSIS — D751 Secondary polycythemia: Secondary | ICD-10-CM | POA: Diagnosis present

## 2015-03-07 ENCOUNTER — Encounter (HOSPITAL_COMMUNITY): Payer: Self-pay | Admitting: Hematology & Oncology

## 2015-04-05 ENCOUNTER — Other Ambulatory Visit (HOSPITAL_COMMUNITY): Payer: Medicare Other

## 2015-04-15 ENCOUNTER — Other Ambulatory Visit (HOSPITAL_COMMUNITY): Payer: Medicare Other

## 2015-04-21 ENCOUNTER — Encounter (HOSPITAL_COMMUNITY): Payer: Medicare Other | Attending: Hematology & Oncology

## 2015-04-21 DIAGNOSIS — D751 Secondary polycythemia: Secondary | ICD-10-CM | POA: Diagnosis present

## 2015-04-21 LAB — CBC WITH DIFFERENTIAL/PLATELET
BASOS ABS: 0 10*3/uL (ref 0.0–0.1)
BASOS PCT: 0 %
Eosinophils Absolute: 0.2 10*3/uL (ref 0.0–0.7)
Eosinophils Relative: 2 %
HCT: 50.5 % (ref 39.0–52.0)
HEMOGLOBIN: 15.6 g/dL (ref 13.0–17.0)
LYMPHS PCT: 33 %
Lymphs Abs: 2.9 10*3/uL (ref 0.7–4.0)
MCH: 26.4 pg (ref 26.0–34.0)
MCHC: 30.9 g/dL (ref 30.0–36.0)
MCV: 85.3 fL (ref 78.0–100.0)
MONO ABS: 0.5 10*3/uL (ref 0.1–1.0)
Monocytes Relative: 6 %
NEUTROS ABS: 5 10*3/uL (ref 1.7–7.7)
NEUTROS PCT: 58 %
Platelets: 200 10*3/uL (ref 150–400)
RBC: 5.92 MIL/uL — ABNORMAL HIGH (ref 4.22–5.81)
RDW: 14.1 % (ref 11.5–15.5)
WBC: 8.7 10*3/uL (ref 4.0–10.5)

## 2015-04-21 LAB — FERRITIN: Ferritin: 9 ng/mL — ABNORMAL LOW (ref 24–336)

## 2015-04-22 ENCOUNTER — Encounter: Payer: Self-pay | Admitting: Hematology & Oncology

## 2015-04-22 NOTE — Progress Notes (Signed)
LABS DRAWN

## 2015-05-04 NOTE — Assessment & Plan Note (Addendum)
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  JAK/MPL/CALR mutations negative.  He requires intermittent therapeutic phlebotomies to maintain a HCT of 50% or less.  He is cutting back on tobacco abuse and he is supported and encouraged to continue; with the end goal of complete cessation.  He report 3 cigarettes per week currently.  He continues to work on decreasing tobacco abuse.  Labs today: CBC diff, ferritin.  Labs in 3 months: CBC diff, ferritin  Will order phlebotomy according to lab results today and in 3 months.  HCT today is 51.1%.  Will set-up 1 therapeutic phlebotomy.  Smoking cessation education provided today.  Patient educated on secondary polycythemia and why it occurs in someone like him.  He is appreciative of the information.  Additionally, I provided him information on risk of lung cancer with tobacco abuse.  He had a CT angio of chest in April 2016.  He meets qualification for lung cancer screening.  We will discuss at his next visit.  Return in 3 months for follow-up.  At that time we will discuss lung cancer screening and I will perform a lung cancer screening visit if patient is agreeable.

## 2015-05-04 NOTE — Progress Notes (Signed)
Pcp Not In System No address on file  Polycythemia  CURRENT THERAPY: Therapeutic phlebotomy to maintain HCT 50% or less and smoking cessation.  INTERVAL HISTORY: Brian Parsons 65 y.o. 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  JAK/MPL/CALR mutations negative.  I personally reviewed and went over laboratory results with the patient.  The results are noted within this dictation.  He is down to smoking 3 cigarettes per week.  He is continuing to work on complete cessation.  He is encouraged today to continue to work on cessation.  He denies any complaints today. He feels good.  He tolerates phlebotomies.  He is educated on secondary polycythemia and the mechanisms in his situation.   Past Medical History  Diagnosis Date  . Hypertension   . COPD (chronic obstructive pulmonary disease) ([place])   . Polycythemia     has COPD (chronic obstructive pulmonary disease) ([place]); Hypoxia; Lower extremity edema; Elevated troponin; Polycythemia; Essential hypertension; Dyspnea; Acute respiratory failure with hypoxia ([place]); Acute diastolic congestive heart failure ([place]); and COPD exacerbation ([place]) on his problem list.     is allergic to amoxicillin and penicillins.  Current Outpatient Prescriptions on File Prior to Visit  Medication Sig Dispense Refill  . ADVAIR DISKUS 250-50 MCG/DOSE AEPB USE 1 INHALATION BY MOUTH EVERY 12 HOURS. <<RINSE MOUTH AFTER USE>>. 60 each 3  . furosemide (LASIX) 20 MG tablet Take 1 tablet (20 mg total) by mouth daily. (Patient taking differently: Take 40 mg by mouth daily. ) 30 tablet 1  . ipratropium-albuterol (DUONEB) 0.5-2.5 (3) MG/3ML SOLN Take 3 mLs by nebulization every 6 (six) hours. 360 mL 1  . lisinopril (PRINIVIL,ZESTRIL) 20 MG tablet Take 0.5 tablets (10 mg total) by mouth daily. 30 tablet 1  . Magnesium Hydroxide (MAGNESIA PO) Take 1 tablet by mouth daily.    . vitamin B-12 (CYANOCOBALAMIN)  1000 MCG tablet Take 1,000 mcg by mouth daily.    Marland Kitchen guaiFENesin (MUCINEX) 600 MG 12 hr tablet Take 1 tablet (600 mg total) by mouth 2 (two) times daily. (Patient not taking: Reported on 08/19/2014) 20 tablet 0  . hydrOXYzine (ATARAX/VISTARIL) 25 MG tablet Take 1 tablet (25 mg total) by mouth 3 (three) times daily as needed for anxiety. (Patient not taking: Reported on 05/05/2015) 30 tablet 0  . nicotine (NICODERM CQ - DOSED IN MG/24 HOURS) 21 mg/24hr patch Place 1 patch (21 mg total) onto the skin daily. (Patient not taking: Reported on 02/02/2015) 28 patch 0  . predniSONE (DELTASONE) 10 MG tablet Take 60mg  po daily for 2 days then 40mg  po daily for 2 days then 30mg  po daily for 2 days then 20mg  po daily for 2 days then 10mg  po daily for 2 days then stop (Patient not taking: Reported on 05/05/2015) 32 tablet 0  . predniSONE (DELTASONE) 20 MG tablet Take 20 mg by mouth daily. Reported on 05/05/2015     No current facility-administered medications on file prior to visit.    History reviewed. No pertinent past surgical history.  Denies any headaches, dizziness, double vision, fevers, chills, night sweats, nausea, vomiting, diarrhea, constipation, chest pain, heart palpitations, shortness of breath, blood in stool, black tarry stool, urinary pain, urinary burning, urinary frequency, hematuria.   PHYSICAL EXAMINATION  ECOG PERFORMANCE STATUS: 0 - Asymptomatic  Filed Vitals:   05/05/15 1029  BP: 113/70  Pulse: 75  Temp: 98 F (36.7 C)  Resp: 18    GENERAL:alert,  no distress, well nourished, well developed, comfortable, cooperative, obese, smiling and erythematous face, unaccompanied. SKIN: skin color, texture, turgor are normal, no rashes or significant lesions HEAD: Normocephalic, No masses, lesions, tenderness or abnormalities EYES: normal, EOMI, Conjunctiva are pink and non-injected EARS: External ears normal OROPHARYNX:lips, buccal mucosa, and tongue normal and mucous membranes are moist    NECK: supple, no adenopathy, thyroid normal size, non-tender, without nodularity, trachea midline LYMPH:  no palpable lymphadenopathy BREAST:not examined LUNGS: clear to auscultation , decreased breath sounds HEART: regular rate & rhythm, no murmurs, no gallops, S1 normal and S2 normal ABDOMEN:abdomen soft, non-tender, obese and normal bowel sounds BACK: Back symmetric, no curvature., No CVA tenderness EXTREMITIES:less then 2 second capillary refill, no joint deformities, effusion, or inflammation, no skin discoloration  NEURO: alert & oriented x 3 with fluent speech, no focal motor/sensory deficits, gait normal    LABORATORY DATA: CBC    Component Value Date/Time   WBC 8.0 05/05/2015 0926   RBC 6.06* 05/05/2015 0926   RBC 6.61* 08/03/2014 1611   HGB 14.9 05/05/2015 0926   HCT 51.1 05/05/2015 0926   PLT 194 05/05/2015 0926   MCV 84.3 05/05/2015 0926   MCH 24.6* 05/05/2015 0926   MCHC 29.2* 05/05/2015 0926   RDW 14.4 05/05/2015 0926   LYMPHSABS 2.9 05/05/2015 0926   MONOABS 0.5 05/05/2015 0926   EOSABS 0.2 05/05/2015 0926   BASOSABS 0.0 05/05/2015 0926      Chemistry      Component Value Date/Time   NA 142 08/19/2014 1600   K 3.7 08/19/2014 1600   CL 93* 08/19/2014 1600   CO2 40* 08/19/2014 1600   BUN 20 08/19/2014 1600   CREATININE 1.12 08/19/2014 1600      Component Value Date/Time   CALCIUM 8.6* 08/19/2014 1600   ALKPHOS 75 08/19/2014 1600   AST 21 08/19/2014 1600   ALT 28 08/19/2014 1600   BILITOT 0.5 08/19/2014 1600     Lab Results  Component Value Date   FERRITIN 9* 04/21/2015    PENDING LABS:   RADIOGRAPHIC STUDIES:  No results found.   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  JAK/MPL/CALR mutations negative.  He requires intermittent therapeutic phlebotomies to maintain a HCT of 50% or less.  He is cutting back on tobacco abuse and he is supported and  encouraged to continue; with the end goal of complete cessation.  He report 3 cigarettes per week currently.  He continues to work on decreasing tobacco abuse.  Labs today: CBC diff, ferritin.  Labs in 3 months: CBC diff, ferritin  Will order phlebotomy according to lab results today and in 3 months.  HCT today is 51.1%.  Will set-up 1 therapeutic phlebotomy.  Smoking cessation education provided today.  Patient educated on secondary polycythemia and why it occurs in someone like him.  He is appreciative of the information.  Additionally, I provided him information on risk of lung cancer with tobacco abuse.  He had a CT angio of chest in April 2016.  He meets qualification for lung cancer screening.  We will discuss at his next visit.  Return in 3 months for follow-up.  At that time we will discuss lung cancer screening and I will perform a lung cancer screening visit if patient is agreeable.    THERAPY PLAN:  Will continue to monitor HCT and maintain a HCT 50% or less with therapeutic phlebotomy.  He is  going to continue with smoking cessation.  All questions were answered. The patient knows to call the clinic with any problems, questions or concerns. We can certainly see the patient much sooner if necessary.  Patient and plan discussed with Dr. Ancil Linsey and she is in agreement with the aforementioned.   This note is electronically signed by: Doy Mince 05/05/2015 1:15 PM

## 2015-05-05 ENCOUNTER — Ambulatory Visit (HOSPITAL_COMMUNITY): Payer: Medicare Other | Admitting: Oncology

## 2015-05-05 ENCOUNTER — Encounter (HOSPITAL_COMMUNITY): Payer: Self-pay | Admitting: Oncology

## 2015-05-05 ENCOUNTER — Encounter (HOSPITAL_COMMUNITY): Payer: Medicare Other

## 2015-05-05 ENCOUNTER — Encounter (HOSPITAL_BASED_OUTPATIENT_CLINIC_OR_DEPARTMENT_OTHER): Payer: Medicare Other | Admitting: Oncology

## 2015-05-05 VITALS — BP 113/70 | HR 75 | Temp 98.0°F | Resp 18 | Wt 242.4 lb

## 2015-05-05 DIAGNOSIS — R0902 Hypoxemia: Secondary | ICD-10-CM

## 2015-05-05 DIAGNOSIS — D751 Secondary polycythemia: Secondary | ICD-10-CM

## 2015-05-05 DIAGNOSIS — J449 Chronic obstructive pulmonary disease, unspecified: Secondary | ICD-10-CM

## 2015-05-05 DIAGNOSIS — Z720 Tobacco use: Secondary | ICD-10-CM

## 2015-05-05 LAB — CBC WITH DIFFERENTIAL/PLATELET
BASOS ABS: 0 10*3/uL (ref 0.0–0.1)
BASOS PCT: 0 %
EOS PCT: 2 %
Eosinophils Absolute: 0.2 10*3/uL (ref 0.0–0.7)
HCT: 51.1 % (ref 39.0–52.0)
Hemoglobin: 14.9 g/dL (ref 13.0–17.0)
Lymphocytes Relative: 36 %
Lymphs Abs: 2.9 10*3/uL (ref 0.7–4.0)
MCH: 24.6 pg — AB (ref 26.0–34.0)
MCHC: 29.2 g/dL — ABNORMAL LOW (ref 30.0–36.0)
MCV: 84.3 fL (ref 78.0–100.0)
Monocytes Absolute: 0.5 10*3/uL (ref 0.1–1.0)
Monocytes Relative: 6 %
Neutro Abs: 4.4 10*3/uL (ref 1.7–7.7)
Neutrophils Relative %: 56 %
Platelets: 194 10*3/uL (ref 150–400)
RBC: 6.06 MIL/uL — AB (ref 4.22–5.81)
RDW: 14.4 % (ref 11.5–15.5)
WBC: 8 10*3/uL (ref 4.0–10.5)

## 2015-05-05 LAB — FERRITIN: FERRITIN: 7 ng/mL — AB (ref 24–336)

## 2015-05-05 NOTE — Patient Instructions (Signed)
..  [place] at [facility] Discharge Instructions  RECOMMENDATIONS MADE BY THE CONSULTANT AND ANY TEST RESULTS WILL BE SENT TO YOUR REFERRING PHYSICIAN.  Therapeutic phlebotomy with in the next 3 weeks  Labs in 3 months  Return in 3 months  Thank you for choosing [place] at [facility] to provide your oncology and hematology care.  To afford each patient quality time with our provider, please arrive at least 15 minutes before your scheduled appointment time.    You need to re-schedule your appointment should you arrive 10 or more minutes late.  We strive to give you quality time with our providers, and arriving late affects you and other patients whose appointments are after yours.  Also, if you no show three or more times for appointments you may be dismissed from the clinic at the providers discretion.     Again, thank you for choosing [facility].  Our hope is that these requests will decrease the amount of time that you wait before being seen by our physicians.       _____________________________________________________________  Should you have questions after your visit to [facility], please contact our office at [phone] between the hours of 8:30 a.m. and 4:30 p.m.  Voicemails left after 4:30 p.m. will not be returned until the following business day.  For prescription refill requests, have your pharmacy contact our office.

## 2015-05-21 ENCOUNTER — Encounter (HOSPITAL_COMMUNITY)
Admission: RE | Admit: 2015-05-21 | Discharge: 2015-05-21 | Disposition: A | Discharge status: Home / Self Care | Payer: Medicare Other | Source: Ambulatory Visit | Attending: Hematology & Oncology | Admitting: Hematology & Oncology

## 2015-05-21 DIAGNOSIS — D751 Secondary polycythemia: Secondary | ICD-10-CM | POA: Insufficient documentation

## 2015-05-21 NOTE — Progress Notes (Signed)
Brian Parsons presents today for phlebotomy per MD orders. HGB/HCT:14.9/51.1 Phlebotomy procedure started at 0854 and ended at 0904. 22 oz removed. Patient tolerated procedure well. IV needle removed intact.

## 2015-05-28 ENCOUNTER — Encounter: Payer: Self-pay | Admitting: Cardiology

## 2015-05-28 ENCOUNTER — Ambulatory Visit (INDEPENDENT_AMBULATORY_CARE_PROVIDER_SITE_OTHER): Payer: Medicare Other | Admitting: Cardiology

## 2015-05-28 VITALS — BP 120/74 | HR 96 | Ht 67.0 in | Wt 247.6 lb

## 2015-05-28 DIAGNOSIS — J449 Chronic obstructive pulmonary disease, unspecified: Secondary | ICD-10-CM | POA: Diagnosis not present

## 2015-05-28 DIAGNOSIS — R600 Localized edema: Secondary | ICD-10-CM

## 2015-05-28 DIAGNOSIS — Z136 Encounter for screening for cardiovascular disorders: Secondary | ICD-10-CM

## 2015-05-28 DIAGNOSIS — D751 Secondary polycythemia: Secondary | ICD-10-CM

## 2015-05-28 DIAGNOSIS — Z720 Tobacco use: Secondary | ICD-10-CM

## 2015-05-28 DIAGNOSIS — R0602 Shortness of breath: Secondary | ICD-10-CM

## 2015-05-28 DIAGNOSIS — I10 Essential (primary) hypertension: Secondary | ICD-10-CM

## 2015-05-28 NOTE — Patient Instructions (Signed)
Your physician wants you to follow-up in: 6 MONTHS WITH DR. BRANCH You will receive a reminder letter in the mail two months in advance. If you don't receive a letter, please call our office to schedule the follow-up appointment.  Your physician recommends that you continue on your current medications as directed. Please refer to the Current Medication list given to you today.  Your physician has requested that you have an echocardiogram. Echocardiography is a painless test that uses sound waves to create images of your heart. It provides your doctor with information about the size and shape of your heart and how well your heart's chambers and valves are working. This procedure takes approximately one hour. There are no restrictions for this procedure.  Thank you for choosing [facility]!!   

## 2015-05-28 NOTE — Progress Notes (Signed)
Cardiology Office Note  Date: 05/28/2015   ID: Brian Parsons, DOB Mar 29, 1951, MRN [id number]  PCP: [name], PA-C Consulting Cardiologist: Rozann Lesches, MD   Chief Complaint  Patient presents with  . Shortness of Breath    History of Present Illness: Brian Parsons is a 65 y.o. male referred for cardiology consultation by Dr. Holly Bodily with [facility]. I was not able to obtain any records for this visit as their office was closed today. I reviewed the information that was already present in EPIC. He tells me that he has had trouble with intermittent leg edema, and was seen recently with recommendation to intensify his Lasix dose from 40 mg daily to 80 mg daily. He states that his leg edema is improving. He recalls having a similar episode last year.  Record review finds hospitalization at [facility] in April 2016 with COPD exacerbation and diastolic heart failure with volume overload. He diuresed well on IV Lasix at that time and was continued on oral diuretic as an outpatient. Echocardiogram showed LVEF 60-65%, and the report indicated normal diastolic parameters although he did have RV dilatation, likely related to his COPD.  He is followed by Dr. 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 He has undergone therapeutic phlebotomies over time. Most recent CBC showed hemoglobin 14.9 and hematocrit 51.1.  I reviewed his chest CTA report from last year. Could not locate any formal PFTs, degree of reported COPD is uncertain. He continues to smoke cigarettes but has cut back to 5 a day.  We went over his medications which are outlined below.  He reports no new changes other than the advancement in Lasix dose.  Reports chronic dyspnea at NYHA class 2-3 depending on level of activity. Also intermittent wheezing.  I personally reviewed his ECG from today which showed sinus  rhythm with PVC.  Past Medical History  Diagnosis Date  . Essential hypertension   . COPD (chronic obstructive pulmonary disease) ([place])   . Polycythemia     History reviewed. No pertinent past surgical history.  Current Outpatient Prescriptions  Medication Sig Dispense Refill  . ADVAIR DISKUS 250-50 MCG/DOSE AEPB USE 1 INHALATION BY MOUTH EVERY 12 HOURS. <<RINSE MOUTH AFTER USE>>. 60 each 3  . doxycycline (VIBRA-TABS) 100 MG tablet Take 1 tablet by mouth 2 (two) times daily.    . furosemide (LASIX) 20 MG tablet Take 80 mg by mouth daily.    Marland Kitchen ipratropium-albuterol (DUONEB) 0.5-2.5 (3) MG/3ML SOLN Take 3 mLs by nebulization every 6 (six) hours. 360 mL 1  . lisinopril (PRINIVIL,ZESTRIL) 20 MG tablet Take 0.5 tablets (10 mg total) by mouth daily. 30 tablet 1  . Magnesium Hydroxide (MAGNESIA PO) Take 1 tablet by mouth daily.    Marland Kitchen oxyCODONE-acetaminophen (PERCOCET/ROXICET) 5-325 MG tablet Take 1 tablet by mouth as needed.     . predniSONE (DELTASONE) 20 MG tablet Take 20 mg by mouth daily. Reported on 05/05/2015    . vitamin B-12 (CYANOCOBALAMIN) 1000 MCG tablet Take 1,000 mcg by mouth daily.     No current facility-administered medications for this visit.   Allergies:  Amoxicillin and Penicillins   Social History: The patient  reports that he has been smoking Cigarettes.  He has a 50 pack-year smoking history. He does not have any smokeless tobacco history on file. He reports that he does not drink alcohol or use illicit drugs.  Family History: The patient's family history includes Emphysema in his father; Stroke in his mother.   ROS:  Please see the history of present illness. Otherwise, complete review of systems is positive for chronic arthritis pains.  All other systems are reviewed and negative.   Physical Exam: VS:  BP 120/74 mmHg  Pulse 96  Ht 5\' 7"  (1.702 m)  Wt 247 lb 9.6 oz (112.311 kg)  BMI 38.77 kg/m2  SpO2 91%, BMI Body mass index is 38.77 kg/(m^2).  Wt Readings from  Last 3 Encounters:  05/28/15 247 lb 9.6 oz (112.311 kg)  05/05/15 242 lb 6.4 oz (109.952 kg)  02/02/15 241 lb 3.2 oz (109.408 kg)    General: Obese male, appears comfortable at rest. HEENT: Conjunctiva and lids normal, oropharynx clear. Neck: Supple, no elevated JVP or carotid bruits, no thyromegaly. Lungs: Decreased breath sounds without active wheezing, nonlabored breathing at rest. Cardiac: Regular rate and rhythm with ectopy, no S3 or significant systolic murmur, no pericardial rub. Abdomen: Protuberant, nontender, bowel sounds present, no guarding or rebound. Extremities: 1-2+ leg edema, distal pulses 2+. Skin: Warm and dry. Musculoskeletal: No kyphosis. Neuropsychiatric: Alert and oriented x3, affect grossly appropriate.  ECG: I personally reviewed the prior tracing from 08/04/2014 which showed sinus arrhythmia with decreased R wave progression and nonspecific T-wave changes.  Recent Labwork: 08/03/2014: B Natriuretic Peptide 59.0; Magnesium 1.9; TSH 1.098 08/19/2014: ALT 28; AST 21; BUN 20; Creatinine, Ser 1.12; Potassium 3.7; Sodium 142 05/05/2015: Hemoglobin 14.9; Platelets 194   Other Studies Reviewed Today:  Chest CTA 08/05/2014: FINDINGS: Left arm contrast injection. The SVC is patent. There is mild four-chamber cardiac enlargement. Central pulmonary arteries are dilated. Satisfactory opacification of pulmonary arteries noted, and there is no evidence of pulmonary emboli. Patient motion degrades some of the images through the lung bases. Patent superior and inferior pulmonary veins bilaterally. Adequate contrast opacification of the thoracic aorta with no evidence of dissection, aneurysm, or stenosis. There is classic 3-vessel brachiocephalic arch anatomy without proximal stenosis. No significant atheromatous plaque.  No pleural or pericardial effusion. Subcentimeter prevascular and precarinal lymph nodes. No hilar adenopathy. Mild subsegmental atelectasis posteriorly  at both lung bases. Mild emphysematous changes in the lung apices. Peribronchial thickening most marked in the right lower lobe. Lungs otherwise clear. Thoracic spine and sternum intact.  Visualized portions of upper abdomen unremarkable.  Review of the MIP images confirms the above findings.  IMPRESSION: 1. Negative for acute PE or thoracic aortic dissection. 2. Emphysema and peribronchial thickening suggesting bronchitis.  Echocardiogram 08/04/2014: Study Conclusions  - Left ventricle: The cavity size was normal. Wall thickness was increased in a pattern of mild LVH. Systolic function was normal. The estimated ejection fraction was in the range of 60% to 65%. Wall motion was normal; there were no regional wall motion abnormalities. Left ventricular diastolic function parameters were normal. - Aortic valve: Mildly calcified annulus. Mildly thickened leaflets. Valve area (VTI): 3.42 cm^2. Valve area (Vmax): 3.39 cm^2. - Right ventricle: The cavity size was moderately dilated. The RV shares the apex with the LV. - Right atrium: The atrium was mildly dilated. - Atrial septum: No defect or patent foramen ovale was identified. - Systemic veins: IVC is dilated with normal respiratory variation, estimated RA pressure is 8 mmHg. - Technically adequate study.  Assessment and Plan:  1. Intermittent leg edema, improving with increase in outpatient Lasix dose. He is typically on Lasix 40 mg daily and is now taking 80 mg daily for the next few days.  Although described as having diastolic heart failure during evaluation last year, this could be secondary to RV dysfunction with COPD as well. I have recommended that he get a scale and weigh himself daily to better follow weight changes, and continue with current strategy of modifying Lasix depending on his leg edema. Also discussed elevating his legs when able. We will plan to obtain a follow-up echocardiogram to reevaluate  cardiac structure and function to ensure that there have been no significant changes.  2. COPD with ongoing tobacco abuse. We discussed smoking cessation. He is trying to cut back. Severity of COPD is uncertain. I would recommend that he have formal PFTs, and if significantly abnormal, consider referral to a pulmonary specialist. Deferring to PCP.  3. Essential hypertension, blood pressure control is good today. He is on lisinopril.  4. Secondary polycythemia, undergoes intermittent phlebotomies. Keep follow-up in the hematology clinic.  Current medicines were reviewed with the patient today.   Orders Placed This Encounter  Procedures  . EKG 12-Lead  . Echocardiogram    Disposition: FU with me in 6 months.   Signed, Satira Sark, MD, [facility] 05/28/2015 1:56 PM    [facility] [place] [facility] [place], [place], [place] [address] Phone: [phone]; Fax: [phone]

## 2015-06-03 ENCOUNTER — Other Ambulatory Visit: Payer: Self-pay

## 2015-06-03 ENCOUNTER — Telehealth: Payer: Self-pay | Admitting: *Deleted

## 2015-06-03 ENCOUNTER — Ambulatory Visit (INDEPENDENT_AMBULATORY_CARE_PROVIDER_SITE_OTHER): Payer: Medicare Other

## 2015-06-03 DIAGNOSIS — R0602 Shortness of breath: Secondary | ICD-10-CM | POA: Diagnosis not present

## 2015-06-03 NOTE — Telephone Encounter (Signed)
Patient informed and verbalized understanding of plan. Copy sent to PCP 

## 2015-06-03 NOTE — Telephone Encounter (Signed)
-----   Message from Satira Sark, MD sent at 06/03/2015  1:26 PM EST ----- Study reviewed. LVEF is normal with mild diastolic dysfunction, also moderate RV and RA dilatation although normal RV contraction and only mildly increased PA pressures. This is consistent with our evaluation recently in the office, would continue with diuretic therapy as already outlined, and keep follow-up with primary care. Copy to PCP.

## 2015-07-19 ENCOUNTER — Telehealth: Payer: Self-pay | Admitting: *Deleted

## 2015-07-19 MED ORDER — METOLAZONE 2.5 MG PO TABS: ORAL_TABLET | ORAL | Status: DC

## 2015-07-19 NOTE — Telephone Encounter (Signed)
Pt aware and will pick up metolazone today. Spoke with Koren Shiver, PA who will see pt back on Wednesday 4/5 for f/u on swelling and will draw labs (BMP) at that time, she will call us with update.

## 2015-07-19 NOTE — Telephone Encounter (Signed)
If he has already been on Lasix 80 mg daily for that long, suggest trying metolazone 2.5 mg to be used for the next 3 days and then as needed depending on degree of leg swelling. He should have a follow-up BMET within the next week.

## 2015-07-19 NOTE — Telephone Encounter (Signed)
Brian Hotter, PA called saying pt was seen by her today with increased bilateral leg swelling and abdomen swelling. Pt has been on lasix 80 mg daily for last 3 + weeks. PA says SOB has not increased and no acute symptoms today other than increased swelling. Says she didn't want to increase or change lasix without advise from Dr. Domenic Polite. Will forward

## 2015-07-21 ENCOUNTER — Encounter: Payer: Self-pay | Admitting: Cardiology

## 2015-07-21 ENCOUNTER — Telehealth: Payer: Self-pay | Admitting: Cardiology

## 2015-07-21 NOTE — Telephone Encounter (Signed)
No c/o sob, dizziness or chest pain. Patient instructed that MD would review lab work tomorrow and patient would be contacted with further instructions about diuretics and swelling. Patient and caregiver verbalized understanding of plan.

## 2015-07-21 NOTE — Telephone Encounter (Signed)
BMP was drawn today and office will fax when resulted

## 2015-07-21 NOTE — Telephone Encounter (Signed)
Neysa Hotter, PA called office stating that she was very frustrated that no one has contacted her back about the care of this patient's edema. PA said that she was informed that she would be contacted today regarding the patient's weight not going down and continuing to have swelling. Nurse advised PA that message below states that lab work is pending and MD would reply tomorrow upon arrival back to office. PA is requesting that a physician contact her directly with a plan for this patients swelling.

## 2015-07-21 NOTE — Telephone Encounter (Signed)
Terence Lux called stating that she spoke with Robb Matar, PA at [facility] . Ms. Brian Parsons was told to call our office about the fluid around his ankles and feet. Please call # [phone].

## 2015-07-21 NOTE — Telephone Encounter (Signed)
Neysa Hotter, PA called with update, pt has taken metolazone 2.5 mg daily X 3 days with 80 mg lasix daily, pt weight has remained at 250lbs today and for the last week and swelling remains unchanged. Will forward to Dr. Domenic Polite

## 2015-07-22 ENCOUNTER — Telehealth: Payer: Self-pay | Admitting: *Deleted

## 2015-07-22 MED ORDER — TORSEMIDE 20 MG PO TABS: 20.0000 mg | ORAL_TABLET | Freq: Two times a day (BID) | ORAL | Status: DC

## 2015-07-22 MED ORDER — TORSEMIDE 20 MG PO TABS: 60.0000 mg | ORAL_TABLET | Freq: Every day | ORAL | Status: DC

## 2015-07-22 NOTE — Telephone Encounter (Signed)
Satira Sark, MD   Sent: Thu July 22, 2015 11:13 AM    To: Merlene Laughter, LPN        Message     I reviewed the lab work. BUN and creatinine are 26 and 1.5 respectively. Recommend changing from Lasix to Demadex at 60 mg daily. I can see him back in the office tomorrow in [place] if this would be helpful.

## 2015-07-22 NOTE — Telephone Encounter (Signed)
Noted morning of April 6 (I was out of the office yesterday). Cannot locate BMET results in chart. There are a few possibilities for treating Mr. Pontiff edema further, but I would like to see the BMET results first and follow-up on his renal function prior to advancing diuretics further. I am happy to talk with Ms. Sharee Pimple if that would be helpful to her. I will be covering the hospital at [facility] today and tomorrow, so I can be reached through the [place] office.

## 2015-07-22 NOTE — Telephone Encounter (Signed)
Spoke with Brian Parsons and informed her of the below information. She is sending lab results to our office now.

## 2015-07-22 NOTE — Telephone Encounter (Signed)
Terence Lux informed and request to see how new medication works before seeing MD. She said that she would contact our office for an appointment if new medication did not help. Phone note sent to Neysa Hotter for [place].

## 2015-07-28 ENCOUNTER — Emergency Department (HOSPITAL_COMMUNITY): Payer: Medicare Other

## 2015-07-28 ENCOUNTER — Inpatient Hospital Stay (HOSPITAL_COMMUNITY)
Admission: EM | Admit: 2015-07-28 | Discharge: 2015-08-02 | DRG: 291 | Disposition: A | Discharge status: Home / Self Care | Payer: Medicare Other | Attending: Family Medicine | Admitting: Family Medicine

## 2015-07-28 ENCOUNTER — Encounter (HOSPITAL_COMMUNITY): Payer: Self-pay | Admitting: Emergency Medicine

## 2015-07-28 DIAGNOSIS — R9431 Abnormal electrocardiogram [ECG] [EKG]: Secondary | ICD-10-CM | POA: Diagnosis present

## 2015-07-28 DIAGNOSIS — J441 Chronic obstructive pulmonary disease with (acute) exacerbation: Secondary | ICD-10-CM | POA: Diagnosis present

## 2015-07-28 DIAGNOSIS — N289 Disorder of kidney and ureter, unspecified: Secondary | ICD-10-CM

## 2015-07-28 DIAGNOSIS — Z825 Family history of asthma and other chronic lower respiratory diseases: Secondary | ICD-10-CM | POA: Diagnosis not present

## 2015-07-28 DIAGNOSIS — J9601 Acute respiratory failure with hypoxia: Secondary | ICD-10-CM | POA: Diagnosis present

## 2015-07-28 DIAGNOSIS — Z823 Family history of stroke: Secondary | ICD-10-CM

## 2015-07-28 DIAGNOSIS — I5043 Acute on chronic combined systolic (congestive) and diastolic (congestive) heart failure: Secondary | ICD-10-CM | POA: Diagnosis present

## 2015-07-28 DIAGNOSIS — E669 Obesity, unspecified: Secondary | ICD-10-CM | POA: Diagnosis present

## 2015-07-28 DIAGNOSIS — N179 Acute kidney failure, unspecified: Secondary | ICD-10-CM | POA: Diagnosis present

## 2015-07-28 DIAGNOSIS — R0600 Dyspnea, unspecified: Secondary | ICD-10-CM | POA: Diagnosis present

## 2015-07-28 DIAGNOSIS — Z9111 Patient's noncompliance with dietary regimen: Secondary | ICD-10-CM | POA: Diagnosis not present

## 2015-07-28 DIAGNOSIS — F17210 Nicotine dependence, cigarettes, uncomplicated: Secondary | ICD-10-CM | POA: Diagnosis present

## 2015-07-28 DIAGNOSIS — D751 Secondary polycythemia: Secondary | ICD-10-CM | POA: Diagnosis present

## 2015-07-28 DIAGNOSIS — Z6837 Body mass index (BMI) 37.0-37.9, adult: Secondary | ICD-10-CM

## 2015-07-28 DIAGNOSIS — I10 Essential (primary) hypertension: Secondary | ICD-10-CM | POA: Diagnosis present

## 2015-07-28 DIAGNOSIS — I5033 Acute on chronic diastolic (congestive) heart failure: Secondary | ICD-10-CM | POA: Diagnosis not present

## 2015-07-28 DIAGNOSIS — R600 Localized edema: Secondary | ICD-10-CM

## 2015-07-28 DIAGNOSIS — I5020 Unspecified systolic (congestive) heart failure: Secondary | ICD-10-CM

## 2015-07-28 DIAGNOSIS — I5031 Acute diastolic (congestive) heart failure: Secondary | ICD-10-CM | POA: Diagnosis not present

## 2015-07-28 DIAGNOSIS — N189 Chronic kidney disease, unspecified: Secondary | ICD-10-CM | POA: Diagnosis present

## 2015-07-28 DIAGNOSIS — I509 Heart failure, unspecified: Secondary | ICD-10-CM

## 2015-07-28 DIAGNOSIS — I130 Hypertensive heart and chronic kidney disease with heart failure and stage 1 through stage 4 chronic kidney disease, or unspecified chronic kidney disease: Principal | ICD-10-CM | POA: Diagnosis present

## 2015-07-28 LAB — CBC
HCT: 49.3 % (ref 39.0–52.0)
HCT: 49.6 % (ref 39.0–52.0)
Hemoglobin: 13.8 g/dL (ref 13.0–17.0)
Hemoglobin: 13.7 g/dL (ref 13.0–17.0)
MCH: 21.2 pg — ABNORMAL LOW (ref 26.0–34.0)
MCH: 21 pg — AB (ref 26.0–34.0)
MCHC: 28 g/dL — AB (ref 30.0–36.0)
MCHC: 27.6 g/dL — AB (ref 30.0–36.0)
MCV: 75.7 fL — AB (ref 78.0–100.0)
MCV: 76 fL — AB (ref 78.0–100.0)
PLATELETS: 228 10*3/uL (ref 150–400)
Platelets: 217 10*3/uL (ref 150–400)
RBC: 6.51 MIL/uL — ABNORMAL HIGH (ref 4.22–5.81)
RBC: 6.53 MIL/uL — AB (ref 4.22–5.81)
RDW: 18.2 % — AB (ref 11.5–15.5)
RDW: 18.2 % — AB (ref 11.5–15.5)
WBC: 8.8 10*3/uL (ref 4.0–10.5)
WBC: 9.3 10*3/uL (ref 4.0–10.5)

## 2015-07-28 LAB — BASIC METABOLIC PANEL
Anion gap: 10 (ref 5–15)
BUN: 38 mg/dL — AB (ref 6–20)
CO2: 38 mmol/L — ABNORMAL HIGH (ref 22–32)
Calcium: 8.7 mg/dL — ABNORMAL LOW (ref 8.9–10.3)
Chloride: 89 mmol/L — ABNORMAL LOW (ref 101–111)
Creatinine, Ser: 1.79 mg/dL — ABNORMAL HIGH (ref 0.61–1.24)
GFR calc Af Amer: 44 mL/min — ABNORMAL LOW (ref >60)
GFR, EST NON AFRICAN AMERICAN: 38 mL/min — AB (ref >60)
Glucose, Bld: 87 mg/dL (ref 65–99)
Potassium: 4.3 mmol/L (ref 3.5–5.1)
Sodium: 137 mmol/L (ref 135–145)

## 2015-07-28 LAB — BRAIN NATRIURETIC PEPTIDE: B NATRIURETIC PEPTIDE 5: 73 pg/mL (ref 0.0–100.0)

## 2015-07-28 LAB — CREATININE, SERUM
CREATININE: 1.68 mg/dL — AB (ref 0.61–1.24)
GFR calc Af Amer: 47 mL/min — ABNORMAL LOW (ref >60)
GFR calc non Af Amer: 41 mL/min — ABNORMAL LOW (ref >60)

## 2015-07-28 LAB — I-STAT TROPONIN, ED: Troponin i, poc: 0.02 ng/mL (ref 0.00–0.08)

## 2015-07-28 LAB — TROPONIN I: Troponin I: 0.03 ng/mL (ref <0.031)

## 2015-07-28 MED ORDER — SODIUM CHLORIDE 0.9 % IV SOLN: 250.0000 mL | INTRAVENOUS | Status: DC | PRN

## 2015-07-28 MED ORDER — IPRATROPIUM-ALBUTEROL 0.5-2.5 (3) MG/3ML IN SOLN
3.0000 mL | Freq: Four times a day (QID) | RESPIRATORY_TRACT | Status: DC
  Administered 2015-07-28 – 2015-07-31 (×11): 3 mL via RESPIRATORY_TRACT
  Filled 2015-07-28 (×11): qty 3

## 2015-07-28 MED ORDER — SODIUM CHLORIDE 0.9% FLUSH
3.0000 mL | Freq: Two times a day (BID) | INTRAVENOUS | Status: DC
  Administered 2015-07-28 – 2015-08-02 (×10): 3 mL via INTRAVENOUS

## 2015-07-28 MED ORDER — SODIUM CHLORIDE 0.9% FLUSH
3.0000 mL | INTRAVENOUS | Status: DC | PRN
Start: 2015-07-28 — End: 2015-08-02
  Administered 2015-07-29: 3 mL via INTRAVENOUS
  Filled 2015-07-28: qty 3

## 2015-07-28 MED ORDER — FUROSEMIDE 10 MG/ML IJ SOLN
80.0000 mg | Freq: Three times a day (TID) | INTRAMUSCULAR | Status: DC
  Administered 2015-07-28 – 2015-07-29 (×2): 80 mg via INTRAVENOUS
  Filled 2015-07-28 (×2): qty 8

## 2015-07-28 MED ORDER — POTASSIUM CHLORIDE CRYS ER 10 MEQ PO TBCR
10.0000 meq | EXTENDED_RELEASE_TABLET | Freq: Every day | ORAL | Status: DC
  Administered 2015-07-29 – 2015-08-02 (×5): 10 meq via ORAL
  Filled 2015-07-28 (×5): qty 1

## 2015-07-28 MED ORDER — MOMETASONE FURO-FORMOTEROL FUM 200-5 MCG/ACT IN AERO
2.0000 | INHALATION_SPRAY | Freq: Two times a day (BID) | RESPIRATORY_TRACT | Status: DC
  Administered 2015-07-29 – 2015-08-02 (×9): 2 via RESPIRATORY_TRACT
  Filled 2015-07-28: qty 8.8

## 2015-07-28 MED ORDER — ACETAMINOPHEN 325 MG PO TABS
650.0000 mg | ORAL_TABLET | ORAL | Status: DC | PRN
  Administered 2015-07-31 – 2015-08-02 (×3): 650 mg via ORAL
  Filled 2015-07-28 (×3): qty 2

## 2015-07-28 MED ORDER — IPRATROPIUM-ALBUTEROL 0.5-2.5 (3) MG/3ML IN SOLN: 3.0000 mL | Freq: Four times a day (QID) | RESPIRATORY_TRACT | Status: DC

## 2015-07-28 MED ORDER — ONDANSETRON HCL 4 MG/2ML IJ SOLN: 4.0000 mg | Freq: Four times a day (QID) | INTRAMUSCULAR | Status: DC | PRN

## 2015-07-28 MED ORDER — FUROSEMIDE 10 MG/ML IJ SOLN
40.0000 mg | Freq: Once | INTRAMUSCULAR | Status: AC
  Administered 2015-07-28: 40 mg via INTRAVENOUS
  Filled 2015-07-28: qty 4

## 2015-07-28 MED ORDER — ENOXAPARIN SODIUM 40 MG/0.4ML ~~LOC~~ SOLN
40.0000 mg | SUBCUTANEOUS | Status: DC
  Administered 2015-07-28 – 2015-08-02 (×6): 40 mg via SUBCUTANEOUS
  Filled 2015-07-28 (×6): qty 0.4

## 2015-07-28 MED ORDER — VITAMIN B-12 1000 MCG PO TABS
1000.0000 ug | ORAL_TABLET | Freq: Every day | ORAL | Status: DC
  Administered 2015-07-29 – 2015-08-02 (×5): 1000 ug via ORAL
  Filled 2015-07-28 (×5): qty 1

## 2015-07-28 NOTE — H&P (Addendum)
[facility] History and Physical  Caius Silbernagel ZOX:[id number] DOB: 02/02/1951 DOA: 07/28/2015  Referring physician: Dr Roderic Palau  PCP: [name], PA-C   Chief Complaint: DYspnea  HPI: Brian Parsons is a 65 y.o. male with hx of HTN 12 yrs, COPD/ tobacco use, CHF last yr in hosp x 1 wk.  Presenting to ED with DOE, orthopnea and fatigue with inc'd bilat LE swelling for 1-3 weeks.  Was seen by cardiologist last week with ^'d ankle edema and lasix 80/d was changed to torsemide 60 mg/ d.  Denies any chest pain/ tightness.  Using 2 pillows now, gets out of breath after walking about 50 feet.  No prod cough, f/c/s, no abd pain n/v/d.  Voiding w/o difficulty.  No hx MI/ CAD/ stents.  Brother is with him today.    Lives near [place], retired , worked as an Curator jobs.  Divorced , +smoker, no etoh hx, no drug use.  Usual BP 120/80.     ROS  denies CP  no joint pain   no HA  no blurry vision  no rash  no diarrhea  no nausea/ vomiting  no dysuria  no difficulty voiding  no change in urine color    Where does patient live home Can patient participate in ADLs? yes  Past Medical History  Past Medical History  Diagnosis Date  . Essential hypertension   . COPD (chronic obstructive pulmonary disease) ([place])   . Polycythemia    Past Surgical History History reviewed. No pertinent past surgical history. Family History  Family History  Problem Relation Age of Onset  . Stroke Mother   . Emphysema Father    Social History  reports that he has been smoking Cigarettes.  He has a 25 pack-year smoking history. He has never used smokeless tobacco. He reports that he does not drink alcohol or use illicit drugs. Allergies  Allergies  Allergen Reactions  . Amoxicillin Hives  . Penicillins Hives   Home medications Prior to Admission medications   Medication Sig Start Date End Date Taking? Authorizing Provider  ADVAIR DISKUS 250-50 MCG/DOSE AEPB USE 1 INHALATION  BY MOUTH EVERY 12 HOURS. <<RINSE MOUTH AFTER USE>>. 10/28/14  Yes Patrici Ranks, MD  ipratropium-albuterol (DUONEB) 0.5-2.5 (3) MG/3ML SOLN Take 3 mLs by nebulization every 6 (six) hours. 08/07/14  Yes Kathie Dike, MD  lisinopril (PRINIVIL,ZESTRIL) 20 MG tablet Take 0.5 tablets (10 mg total) by mouth daily. 08/07/14  Yes Kathie Dike, MD  MAGNESIUM PO Take 1 tablet by mouth daily.   Yes Historical Provider, MD  potassium chloride (K-DUR,KLOR-CON) 10 MEQ tablet Take 10 mEq by mouth daily. 07/05/15  Yes Historical Provider, MD  torsemide (DEMADEX) 20 MG tablet Take 3 tablets (60 mg total) by mouth daily. 07/22/15  Yes Satira Sark, MD  vitamin B-12 (CYANOCOBALAMIN) 1000 MCG tablet Take 1,000 mcg by mouth daily.   Yes Historical Provider, MD   Liver Function Tests No results for input(s): AST, ALT, ALKPHOS, BILITOT, PROT, ALBUMIN in the last 168 hours. No results for input(s): LIPASE, AMYLASE in the last 168 hours. CBC  Recent Labs Lab 07/28/15 1308  WBC 8.8  HGB 13.8  HCT 49.3  MCV 75.7*  PLT 811   Basic Metabolic Panel  Recent Labs Lab 07/28/15 1308  NA 137  K 4.3  CL 89*  CO2 38*  GLUCOSE 87  BUN 38*  CREATININE 1.79*  CALCIUM 8.7*     Filed Vitals:   07/28/15 1223 07/28/15  1500 07/28/15 1530  BP: 118/73 107/66 100/52  Pulse: 88 80 91  Temp: 98.2 F (36.8 C) 98.2 F (36.8 C)   TempSrc: Temporal Oral   Resp: 20 21 22   Height: 5' 7.5" (1.715 m)    Weight: 109.77 kg (242 lb)    SpO2: 91% 98% 96%   Exam: VS: BP 90/60  HR 88 bpm  RR 20   Temp afeb   91% RA, 96% 2L Gen alert, obese , no distress No rash, cyanosis or gangrene Sclera anicteric, throat clear  +JVD to angle of jaw Chest mostly clear, occ basilar rales, diffuse mild exp wheezing post RRR no MRG Abd soft quite obese ntnd no mass or gross ascites +bs GU normal male MS no joint effusions or deformity Ext 2+ bilat LE edema / no wounds or ulcers Neuro is alert, Ox 3 , nf   EKG (independently  reviewed) > Abnormal R-wave progression, early transition Abnrm TWI, consider ischemia in anterolateral leads.  TWI/ mild ST depression worse than prior in 2016.   CXR (independently reviewed) > vasc congestion, no infiltrates or gross edema  Home medications > prinivil, KDur, demadex 60 mg / d, vit B-12, duoneb, advair diskus  Na 137 K 4.3 BUN 38 Creat 1.79  eGFR 38   Trop 0.02 WBC 8k  Hb 13.8   Assessment: 1. Dyspnea - some degree of CHF w abnormal CXR/ new swelling of LE's. BP's unusually low however. Not in distress.  Possibly some COPD component with wheezing.  Abnormal EKG w some ST depression worse than 1 yr ago but neg troponin and no CP.  Creat up c/w CKD 3 (last yr creat 1.12).  Plan admit, tele , IV lasix higher dose, attempt to diurese.  Hold acei due to ^creat/ soft bp's.  Give duonebs scheduled, consider IV glucocorticoids if wheezing worsens or BP worsens with diuresis.  Get another trop, get cards input if not improving.   Get BNP.  2. Acute/ chron renal insuff - as above, poss due to decomp CHF  3. HTN - hold acei 4. COPD - as above 5. Tobacco  Plan - as above   DVT Prophylaxis lovenox renal dose  Code Status: full  Family Communication: brother here  Disposition Plan: home when better    Sol Blazing [facility] Pager [phone]  Cell [phone]  If 7PM-7AM, please contact night-coverage [email or web address] Password [facility] 07/28/2015, 4:39 PM

## 2015-07-28 NOTE — ED Notes (Signed)
Pt placed on 2L [place] for 02 sats 88%.

## 2015-07-28 NOTE — ED Notes (Signed)
Pt sent by PCP for COPD and CHF.

## 2015-07-28 NOTE — ED Provider Notes (Signed)
CSN: [id number]     Arrival date & time 07/28/15  1220 History   First MD Initiated Contact with Patient 07/28/15 1505     Chief Complaint  Patient presents with  . Shortness of Breath     (Consider location/radiation/quality/duration/timing/severity/associated sxs/prior Treatment) Patient is a 65 y.o. male presenting with shortness of breath. The history is provided by the patient (Patient complains of shortness of breath and swelling in his ankles).  Shortness of Breath Severity:  Moderate Onset quality:  Sudden Timing:  Intermittent Progression:  Unable to specify Chronicity:  New Context: activity   Relieved by:  Nothing Associated symptoms: no abdominal pain, no chest pain, no cough, no headaches and no rash     Past Medical History  Diagnosis Date  . Essential hypertension   . COPD (chronic obstructive pulmonary disease) ([place])   . Polycythemia    History reviewed. No pertinent past surgical history. Family History  Problem Relation Age of Onset  . Stroke Mother   . Emphysema Father    Social History  Substance Use Topics  . Smoking status: Current Every Day Smoker -- 0.50 packs/day for 50 years    Types: Cigarettes  . Smokeless tobacco: Never Used     Comment:  5 ciggs per week / every other day   . Alcohol Use: No     Comment: No EtOH for 7 years    Review of Systems  Constitutional: Negative for appetite change and fatigue.  HENT: Negative for congestion, ear discharge and sinus pressure.   Eyes: Negative for discharge.  Respiratory: Positive for shortness of breath. Negative for cough.   Cardiovascular: Negative for chest pain.  Gastrointestinal: Negative for abdominal pain and diarrhea.  Genitourinary: Negative for frequency and hematuria.  Musculoskeletal: Negative for back pain.       Edema  Skin: Negative for rash.  Neurological: Negative for seizures and headaches.  Psychiatric/Behavioral: Negative for hallucinations.      Allergies   Amoxicillin and Penicillins  Home Medications   Prior to Admission medications   Medication Sig Start Date End Date Taking? Authorizing Provider  ADVAIR DISKUS 250-50 MCG/DOSE AEPB USE 1 INHALATION BY MOUTH EVERY 12 HOURS. <<RINSE MOUTH AFTER USE>>. 10/28/14  Yes Patrici Ranks, MD  ipratropium-albuterol (DUONEB) 0.5-2.5 (3) MG/3ML SOLN Take 3 mLs by nebulization every 6 (six) hours. 08/07/14  Yes Kathie Dike, MD  lisinopril (PRINIVIL,ZESTRIL) 20 MG tablet Take 0.5 tablets (10 mg total) by mouth daily. 08/07/14  Yes Kathie Dike, MD  MAGNESIUM PO Take 1 tablet by mouth daily.   Yes Historical Provider, MD  potassium chloride (K-DUR,KLOR-CON) 10 MEQ tablet Take 10 mEq by mouth daily. 07/05/15  Yes Historical Provider, MD  torsemide (DEMADEX) 20 MG tablet Take 3 tablets (60 mg total) by mouth daily. 07/22/15  Yes Satira Sark, MD  vitamin B-12 (CYANOCOBALAMIN) 1000 MCG tablet Take 1,000 mcg by mouth daily.   Yes Historical Provider, MD   BP 100/52 mmHg  Pulse 91  Temp(Src) 98.2 F (36.8 C) (Oral)  Resp 22  Ht 5' 7.5" (1.715 m)  Wt 242 lb (109.77 kg)  BMI 37.32 kg/m2  SpO2 96% Physical Exam  Constitutional: He is oriented to person, place, and time. He appears well-developed.  HENT:  Head: Normocephalic.  Eyes: Conjunctivae and EOM are normal. No scleral icterus.  Neck: Neck supple. No thyromegaly present.  Cardiovascular: Normal rate and regular rhythm.  Exam reveals no gallop and no friction rub.   No murmur heard.  Pulmonary/Chest: No stridor. He has no wheezes. He has no rales. He exhibits no tenderness.  Abdominal: He exhibits no distension. There is no tenderness. There is no rebound.  Musculoskeletal: Normal range of motion. He exhibits edema.  3+ edema in both lower legs up to his knees  Lymphadenopathy:    He has no cervical adenopathy.  Neurological: He is oriented to person, place, and time. He exhibits normal muscle tone. Coordination normal.  Skin: No rash  noted. No erythema.  Psychiatric: He has a normal mood and affect. His behavior is normal.    ED Course  Procedures (including critical care time) Labs Review Labs Reviewed  BASIC METABOLIC PANEL - Abnormal; Notable for the following:    Chloride 89 (*)    CO2 38 (*)    BUN 38 (*)    Creatinine, Ser 1.79 (*)    Calcium 8.7 (*)    GFR calc non Af Amer 38 (*)    GFR calc Af Amer 44 (*)    All other components within normal limits  CBC - Abnormal; Notable for the following:    RBC 6.51 (*)    MCV 75.7 (*)    MCH 21.2 (*)    MCHC 28.0 (*)    RDW 18.2 (*)    All other components within normal limits  BRAIN NATRIURETIC PEPTIDE  I-STAT TROPOININ, ED    Imaging Review Dg Chest 2 View  07/28/2015  CLINICAL DATA:  Shortness of breath, COPD, CHF EXAM: CHEST  2 VIEW COMPARISON:  05/26/2015 FINDINGS: Cardiomegaly with vascular congestion. No overt edema. No confluent opacities or effusions. No acute bony abnormality. IMPRESSION: Cardiomegaly, vascular congestion. Electronically Signed   By: Rolm Baptise M.D.   On: 07/28/2015 13:39   I have personally reviewed and evaluated these images and lab results as part of my medical decision-making.   EKG Interpretation   Date/Time:  Wednesday July 28 2015 14:45:13 EDT Ventricular Rate:  91 PR Interval:  136 QRS Duration: 75 QT Interval:  349 QTC Calculation: 429 R Axis:   38 Text Interpretation:  Sinus rhythm Abnormal R-wave progression, early  transition Abnrm T, consider ischemia, anterolateral lds Baseline wander  in lead(s) I III aVL Confirmed by ZACKOWSKI  MD, SCOTT [id number]) on  07/28/2015 2:50:12 PM      MDM   Final diagnoses:  Systolic congestive heart failure, unspecified congestive heart failure chronicity ([place])    Patient with congestive heart failure. He'll be admitted to medicine for diuresis    Milton Ferguson, MD 07/28/15 1625

## 2015-07-29 DIAGNOSIS — N189 Chronic kidney disease, unspecified: Secondary | ICD-10-CM

## 2015-07-29 DIAGNOSIS — I5033 Acute on chronic diastolic (congestive) heart failure: Secondary | ICD-10-CM

## 2015-07-29 DIAGNOSIS — N289 Disorder of kidney and ureter, unspecified: Secondary | ICD-10-CM

## 2015-07-29 LAB — BASIC METABOLIC PANEL
Anion gap: 9 (ref 5–15)
BUN: 35 mg/dL — AB (ref 6–20)
CO2: 40 mmol/L — ABNORMAL HIGH (ref 22–32)
CREATININE: 1.61 mg/dL — AB (ref 0.61–1.24)
Calcium: 8.7 mg/dL — ABNORMAL LOW (ref 8.9–10.3)
Chloride: 91 mmol/L — ABNORMAL LOW (ref 101–111)
GFR calc Af Amer: 50 mL/min — ABNORMAL LOW (ref >60)
GFR, EST NON AFRICAN AMERICAN: 43 mL/min — AB (ref >60)
GLUCOSE: 102 mg/dL — AB (ref 65–99)
Potassium: 4.6 mmol/L (ref 3.5–5.1)
SODIUM: 140 mmol/L (ref 135–145)

## 2015-07-29 LAB — TROPONIN I

## 2015-07-29 MED ORDER — ALBUTEROL SULFATE (2.5 MG/3ML) 0.083% IN NEBU: 2.5000 mg | INHALATION_SOLUTION | RESPIRATORY_TRACT | Status: DC | PRN

## 2015-07-29 MED ORDER — PREDNISONE 20 MG PO TABS
40.0000 mg | ORAL_TABLET | Freq: Every day | ORAL | Status: DC
  Administered 2015-07-30 – 2015-08-02 (×4): 40 mg via ORAL
  Filled 2015-07-29 (×4): qty 2

## 2015-07-29 MED ORDER — FUROSEMIDE 10 MG/ML IJ SOLN
80.0000 mg | Freq: Two times a day (BID) | INTRAMUSCULAR | Status: DC
  Administered 2015-07-29 – 2015-07-31 (×4): 80 mg via INTRAVENOUS
  Filled 2015-07-29 (×4): qty 8

## 2015-07-29 MED ORDER — ASPIRIN 81 MG PO CHEW
81.0000 mg | CHEWABLE_TABLET | Freq: Every day | ORAL | Status: DC
  Administered 2015-07-29 – 2015-08-02 (×5): 81 mg via ORAL
  Filled 2015-07-29 (×5): qty 1

## 2015-07-29 NOTE — Progress Notes (Signed)
**Note De-identified Brian Parsons Obfuscation** EKG completed and placed in patient's chart

## 2015-07-29 NOTE — Consult Note (Signed)
CARDIOLOGY CONSULT NOTE   Patient ID: Brian Parsons MRN: [id number] DOB/AGE: 1950/12/08 65 y.o.  Admit Date: 07/28/2015 Referring Physician: TRH-Goodrich MD Primary Physician: [name], PA-C Consulting Cardiologist: Carlyle Dolly MD Primary Cardiologist: Rozann Lesches MD Reason for Consultation: CHF  Clinical Summary Brian Parsons is a 65 y.o.male with known history of chronic LEE, polycythemia secondary to chronic hypoxia, COPD, hypertension, ongoing tobacco abuse. Was seen initially by Dr. Domenic Polite in [place] office, in Feb of 2017 to be established and echocardiogram was completed. This revealed normal LVEF and mild diastolic CHF with moderate RV and RA dilation and continued on po diuretic therapy torsemide 60 mg daily .   He presented to ER with complaints of LEE for 1-3 weeks, DOE, and orthopnea.  On arrival to ER, HR 88, O2 91%, afebrile. Creatinine 1.79, CO2 38, Hgb 13.8./ Hct 49.3. CXR cardiomegaly, vascular congestion. He was treated with lasix 40 mg IV x1 in ER and admitted for further evaluation. Troponin negative X 2. He has diuresed 1.5 liters since admission.   He states that he had worsening breathing and wheezing and saw PCP. He continued to use neb tx but they did not help. He states he has been eating salty foods but not adding salt to foods. Due to increased wheezing, his PCP advised him to come to ER. He is feeling much better since admission.    Allergies  Allergen Reactions  . Amoxicillin Hives  . Penicillins Hives    Medications Scheduled Medications: . enoxaparin (LOVENOX) injection  40 mg Subcutaneous Q24H  . furosemide  80 mg Intravenous 3 times per day  . ipratropium-albuterol  3 mL Nebulization Q6H  . mometasone-formoterol  2 puff Inhalation BID  . potassium chloride  10 mEq Oral Daily  . sodium chloride flush  3 mL Intravenous Q12H  . vitamin B-12  1,000 mcg Oral Daily    Infusions:    PRN Medications: sodium chloride, acetaminophen,  ondansetron (ZOFRAN) IV, sodium chloride flush   Past Medical History  Diagnosis Date  . Essential hypertension   . COPD (chronic obstructive pulmonary disease) ([place])   . Polycythemia     History reviewed. No pertinent past surgical history.  Family History  Problem Relation Age of Onset  . Stroke Mother   . Emphysema Father     Social History Brian Parsons reports that he has been smoking Cigarettes.  He has a 25 pack-year smoking history. He has never used smokeless tobacco. Brian Parsons reports that he does not drink alcohol.  Review of Systems Complete review of systems are found to be negative unless outlined in H&P above.  Physical Examination Blood pressure 108/66, pulse 72, temperature 97.8 F (36.6 C), temperature source Oral, resp. rate 20, height 5\' 7"  (1.702 m), weight 240 lb 9.6 oz (109.135 kg), SpO2 100 %.  Intake/Output Summary (Last 24 hours) at 07/29/15 0946 Last data filed at 07/29/15 0815  Gross per 24 hour  Intake    480 ml  Output   2050 ml  Net  -1570 ml    Telemetry: NSR with PVC's and PAC's.   GEN: No acute distress  HEENT: Conjunctiva and lids normal, oropharynx clear with moist mucosa. Neck: Supple, no elevated JVP or carotid bruits, no thyromegaly. Lungs: Inspiratory and expiratory wheezes. No coughing diminished in the bases.  Cardiac: Regular rate and rhythm, tachycardic, no S3 or significant systolic murmur, no pericardial rub. Abdomen: Soft, nontender, no hepatomegaly, bowel sounds present, no guarding or rebound. Extremities:  No pitting edema, distal pulses 2+. Skin: Warm and dry. Musculoskeletal: No kyphosis. Neuropsychiatric: Alert and oriented x3, affect grossly appropriate.  Prior Cardiac Testing/Procedures 1. Echocardiogram Left ventricle: The cavity size was normal. Wall thickness was  increased in a pattern of mild LVH. Systolic function was normal.  The estimated ejection fraction was in the range of 60% to 65%.  Wall motion  was normal; there were no regional wall motion  abnormalities. Doppler parameters are consistent with abnormal  left ventricular relaxation (grade 1 diastolic dysfunction). - Aortic valve: Mildly calcified annulus. Trileaflet; mildly  calcified leaflets. - Mitral valve: Calcified annulus. - Left atrium: The atrium was mildly dilated. - Right ventricle: The cavity size was moderately dilated. - Right atrium: The atrium was moderately to severely dilated.  Central venous pressure (est): 3 mm Hg. - Atrial septum: No defect or patent foramen ovale was identified. - Tricuspid valve: There was trivial regurgitation. - Pulmonary arteries: PA peak pressure: 32 mm Hg (S). - Pericardium, extracardiac: There was no pericardial effusion.  Lab Results  Basic Metabolic Panel:  Recent Labs Lab 07/28/15 1308 07/28/15 1944 07/29/15 0150  NA 137  --  140  K 4.3  --  4.6  CL 89*  --  91*  CO2 38*  --  40*  GLUCOSE 87  --  102*  BUN 38*  --  35*  CREATININE 1.79* 1.68* 1.61*  CALCIUM 8.7*  --  8.7*     CBC:  Recent Labs Lab 07/28/15 1308 07/28/15 1944  WBC 8.8 9.3  HGB 13.8 13.7  HCT 49.3 49.6  MCV 75.7* 76.0*  PLT 217 228    Cardiac Enzymes:  Recent Labs Lab 07/28/15 1944 07/29/15 0150  TROPONINI 0.03 <0.03    Radiology: Dg Chest 2 View  07/28/2015  CLINICAL DATA:  Shortness of breath, COPD, CHF EXAM: CHEST  2 VIEW COMPARISON:  05/26/2015 FINDINGS: Cardiomegaly with vascular congestion. No overt edema. No confluent opacities or effusions. No acute bony abnormality. IMPRESSION: Cardiomegaly, vascular congestion. Electronically Signed   By: Rolm Baptise M.D.   On: 07/28/2015 13:39     ECG: SR with inferior-anterior- lateral T-wave inversion.     Impression and Recommendations  1. 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 He has responded to IV lasix and is feeling better. He admits to dietary  non-compliance. EF was normal in Feb of 2017 with mild diastolic dysfunction. Will continue IV lasix. Wt is improving.   2. Abnormal EKG: Inferior and lateral ST changes. Consider dobutamine stress test for evidence of ischemia. Troponin negative.  3. COPD: NYHA class II-III dyspnea. Uses neb at home but no rescue inhalers. Significant wheezing is noted. Refer to PCP for ongoing evaluation.   4. Polycythemia: Followed by hematology.   Signed: Phill Myron. Lawrence NP [employer]  07/29/2015, 9:46 AM Co-Sign MD  Patient seen and discussed with NP Purcell Nails, I agree with her documentation above. 65 yo male history of chronic diastolic HF, COPD, polycythemia admitted with SOB. Recently his 80mg  lasix was changed to torsemide 60mg  daily.    Clinic weight 05/2015 247 lbs ER vitals: p 88 bp 118/73 Wt 242 lbs 91% RA 05/2015 echo: LVEF 123456, grade I diastolic dysfunction BNP 73, Hgb 13.8, Plt 217, K 4.3, Cr 1.79 (baseline 1.1-.1.2), trop 0.02, trop neg x 2 CXR cardiomegaly, vascular congestion EKG SR, incomplete RBBB, TWI and ST depressions anterior and anterolateral leads worst than prior   Cr  1.79 on admit, trending down to 1.61. Baseline Cr around 1.1. He is net negative 1.1 liters since admission, the downtrend in Cr with diuresis suggests his AKI is due to venous congestion and CHF. Continue IV lasix however would be more cautious in the beginning and change to IV 80mg  bid. F/u repeat echo, his poor response to aggressive outpatient diuretics could suggest change in cardiac function, including potential RV dysfunction in setting of his known COPD or new systolic dysfunction in setting of new EKG changes. In reference to EKG changes, we will f/u echo, consider ischemic testing based on echo results. Troponins negative thus far, he has not had any chest pain. COPD exacerbation per primary team.    Zandra Abts MD

## 2015-07-29 NOTE — Progress Notes (Signed)
PROGRESS NOTE  Brian Parsons [id number] DOB: 12-01-1950 DOA: 07/28/2015 PCP: Vesta Mixer  Summary: 65 year old man past medical history COPD, CHF presenting with dyspnea on exertion, orthopnea and increasing bilateral lower extremity edema. Admitted for shortness of breath.  Assessment/Plan: 1. Acute hypoxic respiratory failure. Secondary to CHF and COPD. 2. Acute on chronic diastolic congestive heart failure. CXR showed cardiomegaly and vascular congestion. Troponins negative. BNP was unremarkable. EKG sinus rhythm with anterolateral ST abnormalities. 3. COPD exacerbation appears improved. 4. Acute kidney injury superimposed on chronic kidney disease. BUN 35, Cr 1.61. Modest improvement with diuresis, likely secondary to intravenous congestion, CHF. 5. HTN. ACE inhibitors being held. 6. Tobacco use disorder. Counseled on cessation   Wean oxygen as tolerated.  Continue Lasix, and monitor I&Os.  Will give abx, nebs, steroids and O2 f  Follow-up echocardiogram, further cardiology recommendations  Continue to monitor on tele   Code Status: Full DVT prophylaxis: Lovenox Family Communication: Discussed with patient, ex-wife, and other family Disposition Plan: Discharge once improved  [name], MD  [facility] Direct contact:  --Via [place]  --[email or web address]; password [email or web address] and click  [email or web address] contact night coverage as above 07/29/2015, 7:22 AM  LOS: 1 day   Consultants:  none  Procedures:  none  Antibiotics:  none  HPI/Subjective: Feels alright. Breathing is fine. Denies pain. PO intake has been adequate.  Objective: Filed Vitals:   07/28/15 1952 07/28/15 2031 07/29/15 0236 07/29/15 0500  BP:  115/74  108/66  Pulse:  91  72  Temp:  98.4 F (36.9 C)  97.8 F (36.6 C)  TempSrc:  Oral  Oral  Resp:  20    Height:      Weight:    109.135 kg (240 lb 9.6 oz)  SpO2: 97% 95% 97% 98%    Intake/Output Summary (Last 24 hours) at  07/29/15 0722 Last data filed at 07/29/15 0500  Gross per 24 hour  Intake    480 ml  Output   1600 ml  Net  -1120 ml     Filed Weights   07/28/15 1223 07/28/15 1737 07/29/15 0500  Weight: 109.77 kg (242 lb) 110.026 kg (242 lb 9 oz) 109.135 kg (240 lb 9.6 oz)    Exam:    General:  Appears calm and comfortable Cardiovascular:  trace BLE edema Telemetry: SR, no arrhythmias  Respiratory: faint wheeze, fair air movement, speaks in full sentences, no rhonchi or rales. Mild increased respiratory effort. Psychiatric: grossly normal mood and affect, speech fluent and appropriate Neurologic: grossly non-focal.   New data reviewed:  BUN 35, Cr 1.61. Slightly improved  Troponin wnl  EKG sinus rhythm with marked ST abnormality anterolaterally, consider ischemia  Scheduled Meds: . enoxaparin (LOVENOX) injection  40 mg Subcutaneous Q24H  . furosemide  80 mg Intravenous 3 times per day  . ipratropium-albuterol  3 mL Nebulization Q6H  . mometasone-formoterol  2 puff Inhalation BID  . potassium chloride  10 mEq Oral Daily  . sodium chloride flush  3 mL Intravenous Q12H  . vitamin B-12  1,000 mcg Oral Daily   Continuous Infusions:   Principal Problem:   Acute diastolic congestive heart failure (HCC) Active Problems:   Lower extremity edema   Essential hypertension   Dyspnea   COPD exacerbation (HCC)   Acute on chronic renal insufficiency (HCC)   Acute diastolic CHF (congestive heart failure) ([place])   Time spent 25 minutes   By signing my name below, I, Brian Parsons, attest that this  documentation has been prepared under the direction and in the presence of Cynthis Purington P. Sarajane Jews, MD. Electronically Signed: Delene Parsons, Scribe.  07/29/2015 12:55pm  I personally performed the services described in this documentation. All medical record entries made by the scribe were at my direction. I have reviewed the chart and agree that the record reflects my personal performance and  is accurate and complete. [name], MD

## 2015-07-30 ENCOUNTER — Inpatient Hospital Stay (HOSPITAL_COMMUNITY): Payer: Medicare Other

## 2015-07-30 DIAGNOSIS — J9601 Acute respiratory failure with hypoxia: Secondary | ICD-10-CM

## 2015-07-30 DIAGNOSIS — I5031 Acute diastolic (congestive) heart failure: Secondary | ICD-10-CM

## 2015-07-30 LAB — BASIC METABOLIC PANEL
ANION GAP: 9 (ref 5–15)
BUN: 35 mg/dL — ABNORMAL HIGH (ref 6–20)
CALCIUM: 8.9 mg/dL (ref 8.9–10.3)
CO2: 41 mmol/L — AB (ref 22–32)
Chloride: 89 mmol/L — ABNORMAL LOW (ref 101–111)
Creatinine, Ser: 1.2 mg/dL (ref 0.61–1.24)
GLUCOSE: 106 mg/dL — AB (ref 65–99)
POTASSIUM: 4.3 mmol/L (ref 3.5–5.1)
Sodium: 139 mmol/L (ref 135–145)

## 2015-07-30 LAB — LIPID PANEL
CHOL/HDL RATIO: 4.9 ratio
Cholesterol: 132 mg/dL (ref 0–200)
HDL: 27 mg/dL — AB (ref >40)
LDL Cholesterol: 76 mg/dL (ref 0–99)
TRIGLYCERIDES: 144 mg/dL (ref <150)
VLDL: 29 mg/dL (ref 0–40)

## 2015-07-30 LAB — ECHOCARDIOGRAM COMPLETE
HEIGHTINCHES: 67 in
Weight: 3816.6 oz

## 2015-07-30 LAB — TROPONIN I

## 2015-07-30 NOTE — Progress Notes (Signed)
Subjective:    SOB improving  Objective:   Temp:  [98.3 F (36.8 C)-98.7 F (37.1 C)] 98.7 F (37.1 C) (04/14 0526) Pulse Rate:  [84-101] 84 (04/14 0526) Resp:  [18-21] 20 (04/14 0526) BP: (100-130)/(72-88) 130/72 mmHg (04/14 0526) SpO2:  [84 %-98 %] 91 % (04/14 0828) Weight:  [238 lb 8.6 oz (108.2 kg)] 238 lb 8.6 oz (108.2 kg) (04/14 0526) Last BM Date: 07/29/15  Filed Weights   07/28/15 1737 07/29/15 0500 07/30/15 0526  Weight: 242 lb 9 oz (110.026 kg) 240 lb 9.6 oz (109.135 kg) 238 lb 8.6 oz (108.2 kg)    Intake/Output Summary (Last 24 hours) at 07/30/15 0913 Last data filed at 07/30/15 0900  Gross per 24 hour  Intake   1086 ml  Output   2025 ml  Net   -939 ml     Exam:  General:NAD  HEENT: sclera clear, throat clear  Resp: mild wheezing billateralls  Cardiac: RRR, no m/r/g, no jvd  GI:abd soft, nt, nd  MSK: no LE edema  Neuro: no focal deficits  Psych: appropriate affect  Lab Results:  Basic Metabolic Panel:  Recent Labs Lab 07/28/15 1308 07/28/15 1944 07/29/15 0150 07/30/15 0441  NA 137  --  140 139  K 4.3  --  4.6 4.3  CL 89*  --  91* 89*  CO2 38*  --  40* 41*  GLUCOSE 87  --  102* 106*  BUN 38*  --  35* 35*  CREATININE 1.79* 1.68* 1.61* 1.20  CALCIUM 8.7*  --  8.7* 8.9    Liver Function Tests: No results for input(s): AST, ALT, ALKPHOS, BILITOT, PROT, ALBUMIN in the last 168 hours.  CBC:  Recent Labs Lab 07/28/15 1308 07/28/15 1944  WBC 8.8 9.3  HGB 13.8 13.7  HCT 49.3 49.6  MCV 75.7* 76.0*  PLT 217 228    Cardiac Enzymes:  Recent Labs Lab 07/28/15 1944 07/29/15 0150  TROPONINI 0.03 <0.03    BNP: No results for input(s): PROBNP in the last 8760 hours.  Coagulation: No results for input(s): INR in the last 168 hours.  ECG:   Medications:   Scheduled Medications: . aspirin  81 mg Oral Daily  . enoxaparin (LOVENOX) injection  40 mg Subcutaneous Q24H  . furosemide  80 mg Intravenous BID  .  ipratropium-albuterol  3 mL Nebulization Q6H  . mometasone-formoterol  2 puff Inhalation BID  . potassium chloride  10 mEq Oral Daily  . predniSONE  40 mg Oral Q breakfast  . sodium chloride flush  3 mL Intravenous Q12H  . vitamin B-12  1,000 mcg Oral Daily     Infusions:     PRN Medications:  sodium chloride, acetaminophen, albuterol, ondansetron (ZOFRAN) IV, sodium chloride flush     Assessment/Plan    1. Acute on chronic diastolic HF - [id number] echo: LVEF 123456, grade I diastolic dysfunction - admitted with several weeks of worsening SOB and LE edema, did not respond to intensification of home diuretics - negative 1.1 liters yesterday, negative 1.8 liters since admission. Dramatic improvement in renal function with diuresis consistent with venous congestion and CHF. He is on lasix 80mg  IV bid, would continue current dosing. When converts to oral would change to torsemide 40mg  in AM and 20mg  in PM with instructions to take 40mg  bid if increased swelling or weights.   2. COPD exacerbation - management per primary team  3. AKI - dramatic improvement in renal function, the fact it improved  with diuresis suggests it was related to venous congestion and CHF. Continue to follow renal function with diuresis.   4. Abnormal EKG - fairly impressive anterior and anterolateral TWI and ST depressions, prior EKGs show subtle changes but not to this degree.  - he denies any chest pain, troponins have been negative. Will continue to cycle  - f/u echo. Follow EKG over the weekend. I would anticipate at a minimum a Lexiscan on Monday, but if significant echo findings or  he develops troponin or chest pain may require cath, follow closely over the weekend as his CHF and COPD are further treated. Decision on ischemic testing to be made Monday, but if new developments over the weekend please contact the on call cardiology at [facility].     Carlyle Dolly, M.D.

## 2015-07-30 NOTE — Care Management Note (Signed)
Case Management Note  Patient Details  Name: Brian Parsons MRN: [id number] Date of Birth: Aug 27, 1950  Subjective/Objective:     Patient is alert and orient from home and answers questions appropriately.  Drives self and is independent. Lives with girlfriend. No difficulty with medications. No home O2 or DME.  No CM needs identified.              Action/Plan:Home with self care.   Expected Discharge Date:  07/31/15               Expected Discharge Plan:  Home/Self Care  In-House Referral:     Discharge planning Services  CM Consult  Post Acute Care Choice:    Choice offered to:     DME Arranged:    DME Agency:     HH Arranged:    HH Agency:     Status of Service:  Completed, signed off  Medicare Important Message Given:  Yes Date Medicare IM Given:    Medicare IM give by:    Date Additional Medicare IM Given:    Additional Medicare Important Message give by:     If discussed at [place] of Stay Meetings, dates discussed:    Additional Comments:  Alvie Heidelberg, RN 07/30/2015, 4:59 PM

## 2015-07-30 NOTE — Care Management Important Message (Signed)
Important Message  Patient Details  Name: Brian Parsons MRN: [id number] Date of Birth: 12-Oct-1950   Medicare Important Message Given:  Yes    Alvie Heidelberg, RN 07/30/2015, 11:32 AM

## 2015-07-30 NOTE — Progress Notes (Signed)
PROGRESS NOTE  Brian Parsons [id number] DOB: Dec 24, 1950 DOA: 07/28/2015 PCP: Vesta Mixer  Summary: 65 year old man past medical history COPD, CHF presenting with dyspnea on exertion, orthopnea and increasing bilateral lower extremity edema. Admitted for shortness of breath.  Assessment/Plan: 1. Acute hypoxic respiratory failure. Stable. Secondary to CHF and COPD.  2. Acute on chronic diastolic heart failure. CXR showed cardiomegaly and vascular congestion. Troponins negative. BNP was unremarkable. Improving. Abnormal EKG, cardiology planning ischemic testing 3/17.  3. COPD exacerbation continues to improve 4. Acute kidney injury superimposed on chronic kidney disease. Resolved.  Likely secondary to venous congestion, CHF. BUN 35, Cr 1.20. 5. HTN. ACE inhibitor on hold 6. Tobacco use disorder. Counseled on cessation   Remains stable, wean oxygen as tolerated. Cardiology planing ischemic testing 4/17.  Continue Lasix, and monitor I&Os.  Continue nebs, steroids and O2 for COPD  Continue to monitor on tele  Code Status: Full DVT prophylaxis: Lovenox Family Communication: Discussed with patient Disposition Plan: Discharge once improved  [name], MD  [facility] Direct contact:  --Via amion app OR  --[email or web address]; password [email or web address] and click  [phone] contact night coverage as above 07/30/2015, 7:09 AM  LOS: 2 days   Consultants:  Cardiology  Procedures:  ECHO 4/14 Study Conclusions  - Left ventricle: The cavity size was normal. Wall thickness was  increased in a pattern of mild LVH. Systolic function was normal.  The estimated ejection fraction was in the range of 60% to 65%.  Left ventricular diastolic function parameters were normal. - Aortic valve: Mildly calcified annulus. Trileaflet; mildly  thickened leaflets. Valve area (VTI): 2.72 cm^2. Valve area  (Vmax): 2.69 cm^2. - Left atrium: The atrium was mildly dilated. - Right ventricle:  The cavity size was moderately dilated. It  shares the apex with the LV. - Right atrium: The atrium was mildly dilated. - Atrial septum: No defect or patent foramen ovale was identified. - Systemic veins: IVC is dilated with normal respiratory variation,  estimated RA pressure is 8 mmHg. - Technically difficult study.  Antibiotics:  none  HPI/Subjective: Feels improved today. Breathing is better, and was able to rest last night. Denies chest pain, nausea or vomiting   Objective: Filed Vitals:   07/29/15 2018 07/29/15 2020 07/29/15 2144 07/30/15 0526  BP:   100/87 130/72  Pulse: 95  101 84  Temp:   98.5 F (36.9 C) 98.7 F (37.1 C)  TempSrc:   Oral Oral  Resp: 18  20 20   Height:      Weight:    108.2 kg (238 lb 8.6 oz)  SpO2: 90% 98% 95% 93%    Intake/Output Summary (Last 24 hours) at 07/30/15 0709 Last data filed at 07/30/15 0057  Gross per 24 hour  Intake   1146 ml  Output   2275 ml  Net  -1129 ml     Filed Weights   07/28/15 1737 07/29/15 0500 07/30/15 0526  Weight: 110.026 kg (242 lb 9 oz) 109.135 kg (240 lb 9.6 oz) 108.2 kg (238 lb 8.6 oz)    Exam:    General:  Appears comfortable, calm. Cardiovascular: Regular rate and rhythm, no murmur, rub or gallop. Trace to 1+ BLE edema. Telemetry: Sinus tachycardia Respiratory: Faint wheeze, fair air movement, no rhonchi or rales; speaks in full sentences.  Psychiatric: grossly normal mood and affect, speech fluent and appropriate  New data reviewed:  Urinary output 2275  BUN 35, Cr  now within normal limits  LDL 76  Scheduled  Meds: . aspirin  81 mg Oral Daily  . enoxaparin (LOVENOX) injection  40 mg Subcutaneous Q24H  . furosemide  80 mg Intravenous BID  . ipratropium-albuterol  3 mL Nebulization Q6H  . mometasone-formoterol  2 puff Inhalation BID  . potassium chloride  10 mEq Oral Daily  . predniSONE  40 mg Oral Q breakfast  . sodium chloride flush  3 mL Intravenous Q12H  . vitamin B-12  1,000 mcg  Oral Daily   Continuous Infusions:   Principal Problem:   Acute diastolic congestive heart failure (HCC) Active Problems:   Lower extremity edema   Essential hypertension   Dyspnea   COPD exacerbation (HCC)   Acute on chronic renal insufficiency (HCC)   Acute diastolic CHF (congestive heart failure) ([place])   Time spent 20 minutes   By signing my name below, I, Delene Ruffini, attest that this documentation has been prepared under the direction and in the presence of Allia Wiltsey P. Sarajane Jews, MD. Electronically Signed: Delene Ruffini, Scribe.  07/30/2015 10:30am  I personally performed the services described in this documentation. All medical record entries made by the scribe were at my direction. I have reviewed the chart and agree that the record reflects my personal performance and is accurate and complete. [name], MD

## 2015-07-31 DIAGNOSIS — N179 Acute kidney failure, unspecified: Secondary | ICD-10-CM

## 2015-07-31 LAB — BASIC METABOLIC PANEL
Anion gap: 9 (ref 5–15)
BUN: 31 mg/dL — ABNORMAL HIGH (ref 6–20)
CALCIUM: 9.2 mg/dL (ref 8.9–10.3)
CHLORIDE: 89 mmol/L — AB (ref 101–111)
CO2: 40 mmol/L — AB (ref 22–32)
CREATININE: 1.28 mg/dL — AB (ref 0.61–1.24)
GFR calc Af Amer: >60 mL/min (ref >60)
GFR calc non Af Amer: 57 mL/min — ABNORMAL LOW (ref >60)
Glucose, Bld: 97 mg/dL (ref 65–99)
Potassium: 4.2 mmol/L (ref 3.5–5.1)
SODIUM: 138 mmol/L (ref 135–145)

## 2015-07-31 MED ORDER — IPRATROPIUM-ALBUTEROL 0.5-2.5 (3) MG/3ML IN SOLN
3.0000 mL | Freq: Three times a day (TID) | RESPIRATORY_TRACT | Status: DC
  Administered 2015-08-01 – 2015-08-02 (×5): 3 mL via RESPIRATORY_TRACT
  Filled 2015-07-31 (×5): qty 3

## 2015-07-31 MED ORDER — FUROSEMIDE 10 MG/ML IJ SOLN
80.0000 mg | Freq: Every day | INTRAMUSCULAR | Status: DC
  Administered 2015-08-01: 80 mg via INTRAVENOUS
  Filled 2015-07-31: qty 8

## 2015-07-31 NOTE — Progress Notes (Signed)
SATURATION QUALIFICATIONS: (This note is used to comply with regulatory documentation for home oxygen)  Patient Saturations on Room Air at Rest = 89%  Patient Saturations on Room Air while Ambulating = 78%  Patient Saturations on 2 Liters of oxygen while Ambulating = 90-94%  Please briefly explain why patient needs home oxygen: Patient's oxygen saturation drops while ambulating without oxygen, and patient's oxygen saturation returns to normal limits while on 2 liters of oxygen.

## 2015-07-31 NOTE — Progress Notes (Signed)
PROGRESS NOTE  Brian Parsons [id number] DOB: [birth year] DOA: 07/28/2015 PCP: Brian Parsons  Summary: 64 year old man past medical history COPD, CHF presenting with dyspnea on exertion, orthopnea and increasing bilateral lower extremity edema. Admitted for acute hypoxic respiratory failure, acute diastolic heart failure, COPD exacerbation, acute kidney injury. Rapidly improved with diuresis with resolution of acute kidney injury. Approaching euvolemia. Hypoxia improving. However EKG with significant abnormalities (inferolateral ST segments) (pre-cardiology recommended observation over the weekend with daily EKGs, continue telemetry and monitor for symptoms. As the patient is completely asymptomatic, plan is for stress testing 3 sinus 17.  Assessment/Plan: 1. Acute hypoxic respiratory failure. Continues to improve. Secondary to heart failure and COPD. 2. Acute on chronic diastolic heart failure continues to improve. 3. Abnormal EKG. Significant inferolateral ST abnormalities. Cardiology is recommended daily EKG and stress testing 4/17. Changes wax and wane. Changes more pronounced today. I discussed with Brian Parsons of cardiology, we reviewed the EKGs. As the patient is completely asymptomatic he recommends continued management as outlined. 4. COPD exacerbation rapidly improving. 5. AKI resolved. Secondary to venous congestion and relative hypoperfusion of the kidneys. 6. Hypertension. 7. Tobacco use disorder.    Continues to improve. Remains completely asymptomatic in regard to abnormal EKG. Asbestos was discussed with cardiology today. Continue conservative management plans for stress testing and 48 hours.  Decrease Lasix to daily, continue bronchodilators and steroids..  If the patient develops chest pain or arrhythmias we'll discuss with cardiology.  Code Status: Full DVT prophylaxis: Lovenox Family Communication: Discussed with patient and family at bedside Disposition  Plan: Discharge once improved  Brian Hodgkins, MD  [facility] Direct contact:  --Via amion app OR  --[email or web address]; password [email or web address] and click  [phone] contact night coverage as above 07/31/2015, 6:54 AM  LOS: 3 days   Consultants:  Cardiology  Procedures:  ECHO 4/14 Study Conclusions  - Left ventricle: The cavity size was normal. Wall thickness was  increased in a pattern of mild LVH. Systolic function was normal.  The estimated ejection fraction was in the range of 60% to 65%.  Left ventricular diastolic function parameters were normal. - Aortic valve: Mildly calcified annulus. Trileaflet; mildly  thickened leaflets. Valve area (VTI): 2.72 cm^2. Valve area  (Vmax): 2.69 cm^2. - Left atrium: The atrium was mildly dilated. - Right ventricle: The cavity size was moderately dilated. It  shares the apex with the LV. - Right atrium: The atrium was mildly dilated. - Atrial septum: No defect or patent foramen ovale was identified. - Systemic veins: IVC is dilated with normal respiratory variation,  estimated RA pressure is 8 mmHg. - Technically difficult study.  Antibiotics:  none  HPI/Subjective: Feels much improved today after ambulating. Breathing is good. No chest pain. Is eating well.   Objective: Filed Vitals:   07/30/15 1433 07/30/15 2009 07/30/15 2117 07/31/15 0634  BP: 108/65  119/61 132/84  Pulse: 88  89 92  Temp: 98.4 F (36.9 C)  98.6 F (37 C) 98.3 F (36.8 C)  TempSrc: Oral  Oral Oral  Resp: 20  20 20   Height:      Weight:    107.729 kg (237 lb 8 oz)  SpO2: 97% 95% 98% 98%    Intake/Output Summary (Last 24 hours) at 07/31/15 0654 Last data filed at 07/31/15 0345  Gross per 24 hour  Intake   1200 ml  Output   2750 ml  Net  -1550 ml     Filed Weights   07/29/15 0500 07/30/15  [id number] 07/31/15 0634  Weight: 109.135 kg (240 lb 9.6 oz) 108.2 kg (238 lb 8.6 oz) 107.729 kg (237 lb 8 oz)    Exam: General:  Appears calm and  comfortable Cardiovascular: RRR, no m/r/g. No LE edema. Telemetry: SR, no arrhythmias  Respiratory: Some wheeze, no rhonchi or rales, nml resp effort. Psychiatric: grossly normal mood and affect, speech fluent and appropriate  New data reviewed:  BUN 31, Cr 1.28, stable  Blood sugar stable  Potassium 4.2, Sodium 138  Troponin negative  Scheduled Meds: . aspirin  81 mg Oral Daily  . enoxaparin (LOVENOX) injection  40 mg Subcutaneous Q24H  . furosemide  80 mg Intravenous BID  . ipratropium-albuterol  3 mL Nebulization Q6H  . mometasone-formoterol  2 puff Inhalation BID  . potassium chloride  10 mEq Oral Daily  . predniSONE  40 mg Oral Q breakfast  . sodium chloride flush  3 mL Intravenous Q12H  . vitamin B-12  1,000 mcg Oral Daily   Continuous Infusions:   Principal Problem:   Acute diastolic congestive heart failure (HCC) Active Problems:   Lower extremity edema   Essential hypertension   Dyspnea   COPD exacerbation (HCC)   Acute on chronic renal insufficiency (HCC)   Acute diastolic CHF (congestive heart failure) ([place])   Time spent 20 minutes   By signing my name below, I, Brian Parsons, attest that this documentation has been prepared under the direction and in the presence of Brian P. Sarajane Jews, MD. Electronically Signed: Delene Parsons, Scribe.  07/31/2015 1:15pm   I personally performed the services described in this documentation. All medical record entries made by the scribe were at my direction. I have reviewed the chart and agree that the record reflects my personal performance and is accurate and complete. Brian Hodgkins, MD

## 2015-08-01 NOTE — Progress Notes (Signed)
[facility] PROGRESS NOTE  Brian Parsons [id number] DOB: 09/16/1950 DOA: 07/28/2015 PCP: [name], PA-C    Brief narrative 65 year old male with history of COPD, CHF, ongoing tobacco use and hypertension presented with dyspnea on exertion associated with orthopnea and increasing bilateral lower extremity edema. Patient admitted for acute hypoxic respiratory failure secondary to COPD exacerbation and acute diastolic CHF. He also had acute kidney injury. Symptoms significantly improved with diuresis. However EKG showed significant inferolateral ST changes. Plan on stress test on 4/17.  Assessment/Plan: Acute hypoxic respiratory failure Secondary to acute diastolic CHF and COPD exacerbation Symptoms much improved continue oral prednisone daily with scheduled DuoNeb. Continue Dulera. Continue daily IV Lasix (80 mg). Has been diuresing well with net -4 L since admission. Patient desats to 80s on room air, further worsening on ambulation. Needs continuous home O2, 2L via nasal cannula upon discharge.  Abnormal EKG Significant inferolateral ST abnormality. Asymptomatic. Follow repeat EKG today. 2-D echo shows mild LVH with normal EF of 60-620% with no wall motion abnormalities. Plan on stress test on 4/17.  Acute kidney injury Suspected to be cardiorenal. Resolved with aggressive diuresis.   essential hypertension Stable. Continue home medications   Tobacco abuse Counseled strongly on cessation   Code Status: Full code Family Communication: None at bedside Disposition Plan: Stress test on 4/17. If negative possibly discharge home. Patient will need home O2.   Consultants:  Cardiology  Procedures:  2-D echo  Stress test on 4/17  Antibiotics:  None  HPI/Subjective: Seen and examined. Breathing much better. Denies any chest pain symptoms  Objective: Filed Vitals:   07/31/15 2140 08/01/15 0603  BP: 125/66 149/82  Pulse: 87 85  Temp: 97.7 F (36.5 C)  98 F (36.7 C)  Resp: 18 18    Intake/Output Summary (Last 24 hours) at 08/01/15 1115 Last data filed at 08/01/15 1035  Gross per 24 hour  Intake    723 ml  Output   1350 ml  Net   -627 ml   Filed Weights   07/30/15 0526 07/31/15 0634 08/01/15 0603  Weight: 108.2 kg (238 lb 8.6 oz) 107.729 kg (237 lb 8 oz) 107.548 kg (237 lb 1.6 oz)    Exam:   General:  Elderly obese male not in distress  HEENT: No pallor, moist mucosa, supple neck, no JVD  Chest: Few scattered rhonchi bilaterally, no crackles or wheezing  Cardiovascular: Normal S1 and S2, no murmurs rub or gallop  GI: Soft, nondistended, nontender  Musculoskeletal: Warm, trace edema  CNS: Alert and oriented   Data Reviewed: Basic Metabolic Panel:  Recent Labs Lab 07/28/15 1308 07/28/15 1944 07/29/15 0150 07/30/15 0441 07/31/15 0607  NA 137  --  140 139 138  K 4.3  --  4.6 4.3 4.2  CL 89*  --  91* 89* 89*  CO2 38*  --  40* 41* 40*  GLUCOSE 87  --  102* 106* 97  BUN 38*  --  35* 35* 31*  CREATININE 1.79* 1.68* 1.61* 1.20 1.28*  CALCIUM 8.7*  --  8.7* 8.9 9.2   Liver Function Tests: No results for input(s): AST, ALT, ALKPHOS, BILITOT, PROT, ALBUMIN in the last 168 hours. No results for input(s): LIPASE, AMYLASE in the last 168 hours. No results for input(s): AMMONIA in the last 168 hours. CBC:  Recent Labs Lab 07/28/15 1308 07/28/15 1944  WBC 8.8 9.3  HGB 13.8 13.7  HCT 49.3 49.6  MCV 75.7* 76.0*  PLT 217 228   Cardiac  Enzymes:  Recent Labs Lab 07/28/15 1944 07/29/15 0150 07/30/15 1039 07/30/15 1512 07/30/15 2134  TROPONINI 0.03 <0.03 <0.03 <0.03 <0.03   BNP (last 3 results)  Recent Labs  08/03/14 1300 07/28/15 1308  BNP 59.0 73.0    ProBNP (last 3 results) No results for input(s): PROBNP in the last 8760 hours.  CBG: No results for input(s): GLUCAP in the last 168 hours.  No results found for this or any previous visit (from the past 240 hour(s)).   Studies: No results  found.  Scheduled Meds: . aspirin  81 mg Oral Daily  . enoxaparin (LOVENOX) injection  40 mg Subcutaneous Q24H  . furosemide  80 mg Intravenous Daily  . ipratropium-albuterol  3 mL Nebulization TID  . mometasone-formoterol  2 puff Inhalation BID  . potassium chloride  10 mEq Oral Daily  . predniSONE  40 mg Oral Q breakfast  . sodium chloride flush  3 mL Intravenous Q12H  . vitamin B-12  1,000 mcg Oral Daily   Continuous Infusions:     Time spent: [place], [place]  [facility] Pager [phone]. If 7PM-7AM, please contact night-coverage at [email or web address], password [facility] 08/01/2015, 11:15 AM  LOS: 4 days

## 2015-08-02 ENCOUNTER — Inpatient Hospital Stay (HOSPITAL_BASED_OUTPATIENT_CLINIC_OR_DEPARTMENT_OTHER): Payer: Medicare Other

## 2015-08-02 ENCOUNTER — Inpatient Hospital Stay (HOSPITAL_COMMUNITY): Payer: Medicare Other

## 2015-08-02 ENCOUNTER — Encounter (HOSPITAL_COMMUNITY): Payer: Self-pay

## 2015-08-02 ENCOUNTER — Encounter (HOSPITAL_COMMUNITY): Payer: Self-pay | Admitting: Oncology

## 2015-08-02 DIAGNOSIS — Z720 Tobacco use: Secondary | ICD-10-CM | POA: Insufficient documentation

## 2015-08-02 DIAGNOSIS — I5033 Acute on chronic diastolic (congestive) heart failure: Secondary | ICD-10-CM | POA: Diagnosis not present

## 2015-08-02 DIAGNOSIS — F17200 Nicotine dependence, unspecified, uncomplicated: Secondary | ICD-10-CM | POA: Insufficient documentation

## 2015-08-02 DIAGNOSIS — F17210 Nicotine dependence, cigarettes, uncomplicated: Secondary | ICD-10-CM | POA: Insufficient documentation

## 2015-08-02 DIAGNOSIS — J441 Chronic obstructive pulmonary disease with (acute) exacerbation: Secondary | ICD-10-CM

## 2015-08-02 DIAGNOSIS — R9431 Abnormal electrocardiogram [ECG] [EKG]: Secondary | ICD-10-CM

## 2015-08-02 DIAGNOSIS — I5031 Acute diastolic (congestive) heart failure: Secondary | ICD-10-CM

## 2015-08-02 LAB — NM MYOCAR MULTI W/SPECT W/WALL MOTION / EF
CHL CUP NUCLEAR SDS: 12
CHL CUP NUCLEAR SRS: 2
CHL CUP NUCLEAR SSS: 14
CSEPPHR: 122 {beats}/min
LHR: 0.37
LV dias vol: 102 mL (ref 62–150)
LVSYSVOL: 41 mL
Rest HR: 97 {beats}/min
TID: 0.9

## 2015-08-02 LAB — BASIC METABOLIC PANEL
Anion gap: 10 (ref 5–15)
BUN: 29 mg/dL — AB (ref 6–20)
CALCIUM: 9.3 mg/dL (ref 8.9–10.3)
CO2: 38 mmol/L — AB (ref 22–32)
CREATININE: 1.27 mg/dL — AB (ref 0.61–1.24)
Chloride: 91 mmol/L — ABNORMAL LOW (ref 101–111)
GFR calc Af Amer: >60 mL/min (ref >60)
GFR calc non Af Amer: 57 mL/min — ABNORMAL LOW (ref >60)
GLUCOSE: 97 mg/dL (ref 65–99)
Potassium: 4.2 mmol/L (ref 3.5–5.1)
Sodium: 139 mmol/L (ref 135–145)

## 2015-08-02 MED ORDER — ASPIRIN 81 MG PO CHEW: 81.0000 mg | CHEWABLE_TABLET | Freq: Every day | ORAL | Status: DC

## 2015-08-02 MED ORDER — REGADENOSON 0.4 MG/5ML IV SOLN
INTRAVENOUS | Status: AC
  Administered 2015-08-02: 0.4 mg via INTRAVENOUS
  Filled 2015-08-02: qty 5

## 2015-08-02 MED ORDER — PREDNISONE 10 MG PO TABS: ORAL_TABLET | ORAL | Status: DC

## 2015-08-02 MED ORDER — TECHNETIUM TC 99M SESTAMIBI - CARDIOLITE
30.0000 | Freq: Once | INTRAVENOUS | Status: AC | PRN
  Administered 2015-08-02: 30 via INTRAVENOUS

## 2015-08-02 MED ORDER — TECHNETIUM TC 99M SESTAMIBI GENERIC - CARDIOLITE
10.0000 | Freq: Once | INTRAVENOUS | Status: AC | PRN
  Administered 2015-08-02: 10 via INTRAVENOUS

## 2015-08-02 MED ORDER — FUROSEMIDE 40 MG PO TABS
40.0000 mg | ORAL_TABLET | Freq: Every day | ORAL | Status: DC
  Filled 2015-08-02: qty 1

## 2015-08-02 MED ORDER — TORSEMIDE 20 MG PO TABS
60.0000 mg | ORAL_TABLET | Freq: Every day | ORAL | Status: DC
Start: 2015-08-02 — End: 2015-08-02
  Administered 2015-08-02: 60 mg via ORAL
  Filled 2015-08-02: qty 3

## 2015-08-02 MED ORDER — REGADENOSON 0.4 MG/5ML IV SOLN
0.4000 mg | Freq: Once | INTRAVENOUS | Status: AC
  Administered 2015-08-02: 0.4 mg via INTRAVENOUS
  Filled 2015-08-02: qty 5

## 2015-08-02 NOTE — Progress Notes (Signed)
Patient states understanding of discharge instructions.  

## 2015-08-02 NOTE — Discharge Summary (Signed)
Physician Discharge Summary  Brian Parsons [id number] DOB: 01/28/1951 DOA: 07/28/2015  PCP: [name], PA-C  Admit date: 07/28/2015 Discharge date: 08/02/2015  Recommendations for Outpatient Follow-up:  1. Will have follow-up with cardiology in 2 days time (arranged by Dr. Domenic Polite) for consideration of outpatient left heart catheterization 2. Started on oxygen for hypoxic respiratory failure likely secondary to COPD, diastolic heart failure 3. Continue to encourage smoking cessation   Follow-up Information    Follow up with Rozann Lesches, MD On 08/04/2015.   Specialty:  Cardiology   Contact information:   [place] [address] [phone]      Discharge Diagnoses:  1. Acute hypoxic respiratory failure 2. Acute on chronic diastolic congestive heart failure 3. COPD exacerbation 4. Abnormal EKG 5. Abnormal nuclear medicine stress test 6. Acute kidney injury 7. Tobacco use disorder  Discharge Condition: Improved Disposition: Home  Diet recommendation: Heart healthy  Filed Weights   07/31/15 0634 08/01/15 0603 08/02/15 0621  Weight: 107.729 kg (237 lb 8 oz) 107.548 kg (237 lb 1.6 oz) 107.639 kg (237 lb 4.8 oz)    History of present illness:  65 year old man past medical history COPD, CHF presenting with dyspnea on exertion, orthopnea and increasing bilateral lower extremity edema. Admitted for acute hypoxic respiratory failure, acute diastolic heart failure, COPD exacerbation, acute kidney injury.   Hospital Course:  Treated with steroids, bronchodilators, aggressive IV diuresis. Seen by cardiology in consultation. Acute diastolic heart failure rapidly improved with excellent diuresis. Acute kidney injury resolved with diuresis. COPD exacerbation essentially resolved although he continues to require oxygen and will require oxygen on discharge. Hospitalization was prolonged to monitor his abnormal EKG as recommended by cardiology. He had no chest  pain and troponins were negative. No evidence of ACS. Nuclear stress testing was abnormal and there are plans for outpatient follow-up.  1. Acute hypoxic respiratory failure. Secondary to COPD exacerbation and acute heart failure. 2. Acute on chronic diastolic chest of heart failure. Responded well to IV diuresis. Euvolemic. 3. COPD exacerbation. Resolved with steroids and bronchodilators. 4. Abnormal EKG. With dynamic T-wave changes. Completely asymptomatic. Troponins negative. Stress testing abnormal. 5. AKI resolved. Secondary to venous congestion and relative hypoperfusion of the kidneys. 6. Tobacco use disorder. Counseled on the importance of cessation. He is considering quitting.  Consultants:  Cardiology  Procedures:  ECHO 4/14 Study Conclusions  - Left ventricle: The cavity size was normal. Wall thickness was  increased in a pattern of mild LVH. Systolic function was normal.  The estimated ejection fraction was in the range of 60% to 65%.  Left ventricular diastolic function parameters were normal. - Aortic valve: Mildly calcified annulus. Trileaflet; mildly  thickened leaflets. Valve area (VTI): 2.72 cm^2. Valve area  (Vmax): 2.69 cm^2. - Left atrium: The atrium was mildly dilated. - Right ventricle: The cavity size was moderately dilated. It  shares the apex with the LV. - Right atrium: The atrium was mildly dilated. - Atrial septum: No defect or patent foramen ovale was identified. - Systemic veins: IVC is dilated with normal respiratory variation,  estimated RA pressure is 8 mmHg. - Technically difficult study.  Antibiotics:  none Discharge Instructions  Discharge Instructions    Activity as tolerated - No restrictions    Complete by:  As directed      Diet - low sodium heart healthy    Complete by:  As directed      Discharge instructions    Complete by:  As directed  Call your physician or seek immediate medical attention for chest pain, shortness  of breath or worsening of condition.          Current Discharge Medication List    START taking these medications   Details  aspirin 81 MG chewable tablet Chew 1 tablet (81 mg total) by mouth daily.    predniSONE (DELTASONE) 10 MG tablet Take 40 mg by mouth daily for 3 days, then take 20 mg by mouth daily for 3 days, then take 10 mg by mouth daily for 3 days. Qty: 21 tablet, Refills: 0      CONTINUE these medications which have NOT CHANGED   Details  ADVAIR DISKUS 250-50 MCG/DOSE AEPB USE 1 INHALATION BY MOUTH EVERY 12 HOURS. <<RINSE MOUTH AFTER USE>>. Qty: 60 each, Refills: 3    ipratropium-albuterol (DUONEB) 0.5-2.5 (3) MG/3ML SOLN Take 3 mLs by nebulization every 6 (six) hours. Qty: 360 mL, Refills: 1    lisinopril (PRINIVIL,ZESTRIL) 20 MG tablet Take 0.5 tablets (10 mg total) by mouth daily. Qty: 30 tablet, Refills: 1    MAGNESIUM PO Take 1 tablet by mouth daily.    potassium chloride (K-DUR,KLOR-CON) 10 MEQ tablet Take 10 mEq by mouth daily.    torsemide (DEMADEX) 20 MG tablet Take 3 tablets (60 mg total) by mouth daily. Qty: 270 tablet, Refills: 1    vitamin B-12 (CYANOCOBALAMIN) 1000 MCG tablet Take 1,000 mcg by mouth daily.   Associated Diagnoses: Polycythemia; Hypoxia       Allergies  Allergen Reactions  . Amoxicillin Hives  . Penicillins Hives    The results of significant diagnostics from this hospitalization (including imaging, microbiology, ancillary and laboratory) are listed below for reference.    Significant Diagnostic Studies: Dg Chest 2 View  07/28/2015  CLINICAL DATA:  Shortness of breath, COPD, CHF EXAM: CHEST  2 VIEW COMPARISON:  05/26/2015 FINDINGS: Cardiomegaly with vascular congestion. No overt edema. No confluent opacities or effusions. No acute bony abnormality. IMPRESSION: Cardiomegaly, vascular congestion. Electronically Signed   By: Rolm Baptise M.D.   On: 07/28/2015 13:39   Nm Myocar Multi W/spect W/wall Motion / Ef  08/02/2015   No  diagnostic ST segment changes to indicate ischemia.  Medium, moderate to severe intensity, inferior defect from apex to base that exhibits partial reversibility and is consistent with ischemia in the RCA distribution. There is associated diaphragmatic attenuation as well a contribute partially to this defect.  Nuclear stress EF: 60%.  This is an intermediate risk study.      Labs: Basic Metabolic Panel:  Recent Labs Lab 07/28/15 1308 07/28/15 1944 07/29/15 0150 07/30/15 0441 07/31/15 0607 08/02/15 0639  NA 137  --  140 139 138 139  K 4.3  --  4.6 4.3 4.2 4.2  CL 89*  --  91* 89* 89* 91*  CO2 38*  --  40* 41* 40* 38*  GLUCOSE 87  --  102* 106* 97 97  BUN 38*  --  35* 35* 31* 29*  CREATININE 1.79* 1.68* 1.61* 1.20 1.28* 1.27*  CALCIUM 8.7*  --  8.7* 8.9 9.2 9.3   CBC:  Recent Labs Lab 07/28/15 1308 07/28/15 1944  WBC 8.8 9.3  HGB 13.8 13.7  HCT 49.3 49.6  MCV 75.7* 76.0*  PLT 217 228   Cardiac Enzymes:  Recent Labs Lab 07/28/15 1944 07/29/15 0150 07/30/15 1039 07/30/15 1512 07/30/15 2134  TROPONINI 0.03 <0.03 <0.03 <0.03 <0.03       Recent Labs  08/03/14 1300 07/28/15 1308  BNP 59.0 73.0       Principal Problem:   Acute diastolic congestive heart failure (HCC) Active Problems:   Lower extremity edema   Essential hypertension   Dyspnea   COPD exacerbation (HCC)   Acute on chronic renal insufficiency (HCC)   Acute diastolic CHF (congestive heart failure) ([place])   Time coordinating discharge: 35 minutes  Signed:  [name], MD [facility] 08/02/2015, 5:22 PM

## 2015-08-02 NOTE — Progress Notes (Signed)
SATURATION QUALIFICATIONS: (This note is used to comply with regulatory documentation for home oxygen)  Patient Saturations on Room Air at Rest = 93%  Patient Saturations on Room Air while Ambulating = 75%  Patient Saturations on 3 Liters of oxygen while Ambulating = 96%  Please briefly explain why patient needs home oxygen: CHF, SOB

## 2015-08-02 NOTE — Progress Notes (Signed)
Rescheduled

## 2015-08-02 NOTE — Care Management (Signed)
O2 referral sent to [facility] at [place], for delivery tonight.

## 2015-08-02 NOTE — Discharge Instructions (Signed)
Heart Failure  Heart failure means your heart has trouble pumping blood. This makes it hard for your body to work well. Heart failure is usually a long-term (chronic) condition. You must take good care of yourself and follow your doctor's treatment plan.  HOME CARE   Take your heart medicine as told by your doctor.    Do not stop taking medicine unless your doctor tells you to.    Do not skip any dose of medicine.    Refill your medicines before they run out.    Take other medicines only as told by your doctor or pharmacist.   Stay active if told by your doctor. The elderly and people with severe heart failure should talk with a doctor about physical activity.   Eat heart-healthy foods. Choose foods that are without trans fat and are low in saturated fat, cholesterol, and salt (sodium). This includes fresh or frozen fruits and vegetables, fish, lean meats, fat-free or low-fat dairy foods, whole grains, and high-fiber foods. Lentils and dried peas and beans (legumes) are also good choices.   Limit salt if told by your doctor.   Cook in a healthy way. Roast, grill, broil, bake, poach, steam, or stir-fry foods.   Limit fluids as told by your doctor.   Weigh yourself every morning. Do this after you pee (urinate) and before you eat breakfast. Write down your weight to give to your doctor.   Take your blood pressure and write it down if your doctor tells you to.   Ask your doctor how to check your pulse. Check your pulse as told.   Lose weight if told by your doctor.   Stop smoking or chewing tobacco. Do not use gum or patches that help you quit without your doctor's approval.   Schedule and go to doctor visits as told.   Nonpregnant women should have no more than 1 drink a day. Men should have no more than 2 drinks a day. Talk to your doctor about drinking alcohol.   Stop illegal drug use.   Stay current with shots (immunizations).   Manage your health conditions as told by your doctor.   Learn to  manage your stress.   Rest when you are tired.   If it is really hot outside:    Avoid intense activities.    Use air conditioning or fans, or get in a cooler place.    Avoid caffeine and alcohol.    Wear loose-fitting, lightweight, and light-colored clothing.   If it is really cold outside:    Avoid intense activities.    Layer your clothing.    Wear mittens or gloves, a hat, and a scarf when going outside.    Avoid alcohol.   Learn about heart failure and get support as needed.   Get help to maintain or improve your quality of life and your ability to care for yourself as needed.  GET HELP IF:    You gain weight quickly.   You are more short of breath than usual.   You cannot do your normal activities.   You tire easily.   You cough more than normal, especially with activity.   You have any or more puffiness (swelling) in areas such as your hands, feet, ankles, or belly (abdomen).   You cannot sleep because it is hard to breathe.   You feel like your heart is beating fast (palpitations).   You get dizzy or light-headed when you stand up.  GET HELP   RIGHT AWAY IF:    You have trouble breathing.   There is a change in mental status, such as becoming less alert or not being able to focus.   You have chest pain or discomfort.   You faint.  MAKE SURE YOU:    Understand these instructions.   Will watch your condition.   Will get help right away if you are not doing well or get worse.     This information is not intended to replace advice given to you by your health care provider. Make sure you discuss any questions you have with your health care provider.     Document Released: 01/11/2008 Document Revised: 04/24/2014 Document Reviewed: 05/20/2012  Elsevier Interactive Patient Education 2016 [employer]

## 2015-08-02 NOTE — Progress Notes (Signed)
_ PROGRESS NOTE  Brian Parsons [id number] DOB: 10-01-1950 DOA: 07/28/2015 PCP: Vesta Mixer  Summary: 65 year old man past medical history COPD, CHF presenting with dyspnea on exertion, orthopnea and increasing bilateral lower extremity edema. Admitted for acute hypoxic respiratory failure, acute diastolic heart failure, COPD exacerbation, acute kidney injury. Rapidly improved with diuresis with resolution of acute kidney injury. Approaching euvolemia. Hypoxia improving. However EKG with significant abnormalities (inferolateral ST segments) (pre-cardiology recommended observation over the weekend with daily EKGs, continue telemetry and monitor for symptoms. As the patient is completely asymptomatic, plan is for stress testing 3 sinus 17.  Assessment/Plan: 1. Acute hypoxic respiratory failure. Secondary to COPD exacerbation and acute heart failure. 2. Acute on chronic diastolic chest of heart failure. Appears euvolemic at this time. 3. COPD exacerbation. Appears resolved. 4. Abnormal EKG. With dynamic T-wave changes. Completely asymptomatic. Troponins negative. Stress testing a day per cardiology. 5. AKI resolved. Secondary to venous congestion and relative hypoperfusion of the kidneys. 6. Tobacco use disorder. Counseled on the importance of cessation.   Continues to improve. Remains completely asymptomatic in regard to abnormal EKG. AHe desats to 80's on RA with further worsening while ambulating. He will need 2L O2 [place] upon discharge. Follow up stress test today.  Anticipate discharge home today if cleared by cardiology  Code Status: Full DVT prophylaxis: Lovenox Family Communication: Discussed with patient Disposition Plan: Discharge once improved  [name], MD  [facility] Direct contact:  --Via amion app OR  --[email or web address]; password [email or web address] and click  [email or web address] contact night coverage as above 07/31/2015, 6:54 AM  LOS: 3 days    Consultants:  Cardiology  Procedures:  ECHO 4/14 Study Conclusions  - Left ventricle: The cavity size was normal. Wall thickness was  increased in a pattern of mild LVH. Systolic function was normal.  The estimated ejection fraction was in the range of 60% to 65%.  Left ventricular diastolic function parameters were normal. - Aortic valve: Mildly calcified annulus. Trileaflet; mildly  thickened leaflets. Valve area (VTI): 2.72 cm^2. Valve area  (Vmax): 2.69 cm^2. - Left atrium: The atrium was mildly dilated. - Right ventricle: The cavity size was moderately dilated. It  shares the apex with the LV. - Right atrium: The atrium was mildly dilated. - Atrial septum: No defect or patent foramen ovale was identified. - Systemic veins: IVC is dilated with normal respiratory variation,  estimated RA pressure is 8 mmHg. - Technically difficult study.  Antibiotics:  none  HPI/Subjective: Feels fine today. Breathing and eating have been fine.   Objective: Filed Vitals:   07/30/15 1433 07/30/15 2009 07/30/15 2117 07/31/15 0634  BP: 108/65  119/61 132/84  Pulse: 88  89 92  Temp: 98.4 F (36.9 C)  98.6 F (37 C) 98.3 F (36.8 C)  TempSrc: Oral  Oral Oral  Resp: 20  20 20   Height:      Weight:    107.729 kg (237 lb 8 oz)  SpO2: 97% 95% 98% 98%    Intake/Output Summary (Last 24 hours) at 07/31/15 0654 Last data filed at 07/31/15 0345  Gross per 24 hour  Intake   1200 ml  Output   2750 ml  Net  -1550 ml     Filed Weights   07/29/15 0500 07/30/15 0526 07/31/15 0634  Weight: 109.135 kg (240 lb 9.6 oz) 108.2 kg (238 lb 8.6 oz) 107.729 kg (237 lb 8 oz)    Exam: General:  Appears calm and comfortable Cardiovascular: RRR, no m/r/g. No LE  edema. Telemetry: SR, no arrhythmias  Respiratory: CTA bilaterally, no w/r/r. Normal respiratory effort. Psychiatric: grossly normal mood and affect, speech fluent and appropriate  New data reviewed:  BUN and creatinine  stable  Glucose wnl  BMP unremarkable  EKGSinus rhythm internal lateral T-wave inversion noted. Inferior to have inversion has resolved.  Scheduled Meds: . aspirin  81 mg Oral Daily  . enoxaparin (LOVENOX) injection  40 mg Subcutaneous Q24H  . furosemide  80 mg Intravenous BID  . ipratropium-albuterol  3 mL Nebulization Q6H  . mometasone-formoterol  2 puff Inhalation BID  . potassium chloride  10 mEq Oral Daily  . predniSONE  40 mg Oral Q breakfast  . sodium chloride flush  3 mL Intravenous Q12H  . vitamin B-12  1,000 mcg Oral Daily   Continuous Infusions:   Principal Problem:   Acute diastolic congestive heart failure (HCC) Active Problems:   Lower extremity edema   Essential hypertension   Dyspnea   COPD exacerbation (HCC)   Acute on chronic renal insufficiency (HCC)   Acute diastolic CHF (congestive heart failure) ([place])   Time spent 20 minutes   By signing my name below, I, Delene Ruffini, attest that this documentation has been prepared under the direction and in the presence of Daniel P. Sarajane Jews, MD. Electronically Signed: Delene Ruffini, Scribe.  07/31/2015 9:35am  I personally performed the services described in this documentation. All medical record entries made by the scribe were at my direction. I have reviewed the chart and agree that the record reflects my personal performance and is accurate and complete. [name], MD

## 2015-08-02 NOTE — Care Management Important Message (Signed)
Important Message  Patient Details  Name: Brian Parsons MRN: [id number] Date of Birth: Sep 09, 1950   Medicare Important Message Given:  Yes    Alvie Heidelberg, RN 08/02/2015, 4:07 PM

## 2015-08-02 NOTE — Assessment & Plan Note (Deleted)
He is cutting back on tobacco abuse and he is supported and encouraged to continue; with the end goal of complete cessation.  He report 3 cigarettes per week currently.  He continues to work on decreasing tobacco abuse.   A lung cancer screening counseling and shared decision making visit. Age: 65 Pack year smoking history: ** Current smoker or < 15 years of cessation ** No current symptoms of lung cancer Risks and benefits of lung cancer screening discussed:  Negative- over-diagnosis, radiation exposure, false positives, and additional testing  Positive- discover early stage lung cancer resulting in higher incidence of cure Patient educated regarding the importance of adherence to continued lung cancer screening. Currently, there are no co-morbidities to prevent treatment to therapy for lung cancer and the patient is agreeable to pursue treatment if a malignancy is discovered.  [place] Preventative Services Task Force recommend annual screening for lung cancer with low-dose CT in adults aged 16- 88 years who have a 30 pack year smoking history and currently smoke or have quit smoking within the past 15 years.  Screening should be discontinued once a person has not smoked for 15 years or develops a health problem that substantially limits life expectancy or the ability or willingness to have curative lung surgery.  It is a category B recommendation.  Similar stances are provided by CMS, NCCN, and AATS.  Currently, there are no co-morbidities to prevent treatment to therapy for lung cancer and the patient is agreeable to pursue treatment if a malignancy is discovered.

## 2015-08-02 NOTE — Assessment & Plan Note (Addendum)
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  JAK/MPL/CALR mutations negative.  He requires intermittent therapeutic phlebotomies to maintain a HCT of 50% or less.  Labs today: CBC diff, ferritin.  Labs in 3 months: CBC diff, ferritin  Will order phlebotomy according to lab results today and in 3 months.  Smoking cessation education provided today.  Return in 3 months for follow-up.

## 2015-08-02 NOTE — Progress Notes (Signed)
     Discussed results of stress testing with patient and fianc. Study is consistent with RCA distribution ischemia, normal LVEF, overall intermediate risk. This does not necessarily fit very well with his ECG abnormality distribution, however we did discuss considering further evaluation with cardiac catheterization ultimately to clarify coronary anatomy in more detail. He would like to go home today, is feeling better clinically in terms of diastolic heart failure and COPD exacerbation. We have arranged an office visit in [place] for him to be seen on Wednesday at 11:30 in the morning. Would continue aspirin, Demadex 60 mg daily, KCl 10 mEq daily, also start Cardizem CD 120 mg daily (no beta blocker with COPD). Reviewed plan with Dr. Sarajane Jews.  Satira Sark, M.D., F.A.C.C.

## 2015-08-02 NOTE — Progress Notes (Signed)
Primary cardiologist: Dr. Satira Sark  Seen for followup: Leg edema, COPD exacerbation with diastolic heart failure  Subjective:    Feels much better, leg edema improved. No chest pain or dyspnea at rest.  Objective:   Temp:  [98 F (36.7 C)-98.2 F (36.8 C)] 98 F (36.7 C) (04/17 0621) Pulse Rate:  [85-97] 88 (04/17 0621) Resp:  [18-20] 18 (04/17 0621) BP: (113-133)/(74-83) 133/83 mmHg (04/17 0621) SpO2:  [91 %-97 %] 94 % (04/17 0759) Weight:  [237 lb 4.8 oz (107.639 kg)] 237 lb 4.8 oz (107.639 kg) (04/17 0621) Last BM Date: 07/31/15  Filed Weights   07/31/15 0634 08/01/15 0603 08/02/15 [id number]  Weight: 237 lb 8 oz (107.729 kg) 237 lb 1.6 oz (107.548 kg) 237 lb 4.8 oz (107.639 kg)    Intake/Output Summary (Last 24 hours) at 08/02/15 0957 Last data filed at 08/02/15 [id number]  Gross per 24 hour  Intake    483 ml  Output   2200 ml  Net  -1717 ml    Telemetry: Sinus rhythm with artifact.  Exam:  General: Overweight male, appears comfortable at rest.  Lungs: Decreased breath sounds without wheeze.  Cardiac: RRR without gallop.  Abdomen: Protuberant.  Extremities: Trace to 1+ leg edema.  Lab Results:  Basic Metabolic Panel:  Recent Labs Lab 07/30/15 0441 07/31/15 0607 08/02/15 0639  NA 139 138 139  K 4.3 4.2 4.2  CL 89* 89* 91*  CO2 41* 40* 38*  GLUCOSE 106* 97 97  BUN 35* 31* 29*  CREATININE 1.20 1.28* 1.27*  CALCIUM 8.9 9.2 9.3    CBC:  Recent Labs Lab 07/28/15 1308 07/28/15 1944  WBC 8.8 9.3  HGB 13.8 13.7  HCT 49.3 49.6  MCV 75.7* 76.0*  PLT 217 228    Cardiac Enzymes:  Recent Labs Lab 07/30/15 1039 07/30/15 1512 07/30/15 2134  TROPONINI <0.03 <0.03 <0.03   ECG: Tracings reviewed, anterolateral TWI and associated ST abnormalities - new from prior tracings.  Echocardiogram 07/30/2015: Study Conclusions  - Left ventricle: The cavity size was normal. Wall thickness was  increased in a pattern of mild LVH. Systolic  function was normal.  The estimated ejection fraction was in the range of 60% to 65%.  Left ventricular diastolic function parameters were normal. - Aortic valve: Mildly calcified annulus. Trileaflet; mildly  thickened leaflets. Valve area (VTI): 2.72 cm^2. Valve area  (Vmax): 2.69 cm^2. - Left atrium: The atrium was mildly dilated. - Right ventricle: The cavity size was moderately dilated. It  shares the apex with the LV. - Right atrium: The atrium was mildly dilated. - Atrial septum: No defect or patent foramen ovale was identified. - Systemic veins: IVC is dilated with normal respiratory variation,  estimated RA pressure is 8 mmHg. - Technically difficult study.  Medications:   Scheduled Medications: . aspirin  81 mg Oral Daily  . enoxaparin (LOVENOX) injection  40 mg Subcutaneous Q24H  . furosemide  40 mg Oral Daily  . ipratropium-albuterol  3 mL Nebulization TID  . mometasone-formoterol  2 puff Inhalation BID  . potassium chloride  10 mEq Oral Daily  . predniSONE  40 mg Oral Q breakfast  . sodium chloride flush  3 mL Intravenous Q12H  . vitamin B-12  1,000 mcg Oral Daily    PRN Medications: sodium chloride, acetaminophen, albuterol, ondansetron (ZOFRAN) IV, sodium chloride flush   Assessment:   1. Leg edema, improved with IV diuresis and now back on Lasix 40 mg daily. He states  that metolazone and change to Demedex as outpatient was not helpful.  2. Acute on chronic diastolic heart failure, LVEF stable at 60-65%. Not on beta blocker with COPD.  3. Abnormal ECG with significant ST/T abnormalities in the anterolateral leads - new from prior tracings. No chest pain and troponin I negative.  4. COPD exacerbation likely contributing to problems #1 and #2.   Plan/Discussion:    I reviewed the chart and plan per Dr. Harl Bowie from last week. Patient is NPO but no testing scheduled as yet. Will proceed with Lexiscan Cardiolite as add-on today. Will go back to Demadex  instead of Lasix for now, continue KCL supplements. Might consider adding Diltiazem as well (no beta blocker with COPD). Further recommendations to follow.   Satira Sark, M.D., F.A.C.C.

## 2015-08-03 ENCOUNTER — Ambulatory Visit (HOSPITAL_COMMUNITY): Payer: Medicare Other | Admitting: Oncology

## 2015-08-03 ENCOUNTER — Other Ambulatory Visit (HOSPITAL_COMMUNITY): Payer: Medicare Other

## 2015-08-04 ENCOUNTER — Encounter: Payer: Self-pay | Admitting: Physician Assistant

## 2015-08-04 ENCOUNTER — Other Ambulatory Visit: Payer: Self-pay | Admitting: Physician Assistant

## 2015-08-04 ENCOUNTER — Other Ambulatory Visit (HOSPITAL_COMMUNITY)
Admission: RE | Admit: 2015-08-04 | Discharge: 2015-08-04 | Disposition: A | Discharge status: Home / Self Care | Payer: Medicare Other | Source: Ambulatory Visit | Attending: Physician Assistant | Admitting: Physician Assistant

## 2015-08-04 ENCOUNTER — Telehealth: Payer: Self-pay | Admitting: *Deleted

## 2015-08-04 ENCOUNTER — Encounter: Payer: Self-pay | Admitting: *Deleted

## 2015-08-04 ENCOUNTER — Ambulatory Visit (INDEPENDENT_AMBULATORY_CARE_PROVIDER_SITE_OTHER): Payer: Medicare Other | Admitting: Physician Assistant

## 2015-08-04 VITALS — BP 118/66 | HR 80 | Ht 67.5 in | Wt 239.0 lb

## 2015-08-04 DIAGNOSIS — D751 Secondary polycythemia: Secondary | ICD-10-CM

## 2015-08-04 DIAGNOSIS — Z79899 Other long term (current) drug therapy: Secondary | ICD-10-CM

## 2015-08-04 DIAGNOSIS — J441 Chronic obstructive pulmonary disease with (acute) exacerbation: Secondary | ICD-10-CM | POA: Insufficient documentation

## 2015-08-04 DIAGNOSIS — I10 Essential (primary) hypertension: Secondary | ICD-10-CM | POA: Diagnosis not present

## 2015-08-04 DIAGNOSIS — N189 Chronic kidney disease, unspecified: Secondary | ICD-10-CM

## 2015-08-04 DIAGNOSIS — Z720 Tobacco use: Secondary | ICD-10-CM

## 2015-08-04 DIAGNOSIS — N289 Disorder of kidney and ureter, unspecified: Secondary | ICD-10-CM

## 2015-08-04 DIAGNOSIS — R931 Abnormal findings on diagnostic imaging of heart and coronary circulation: Secondary | ICD-10-CM

## 2015-08-04 DIAGNOSIS — I5032 Chronic diastolic (congestive) heart failure: Secondary | ICD-10-CM

## 2015-08-04 DIAGNOSIS — R9439 Abnormal result of other cardiovascular function study: Secondary | ICD-10-CM | POA: Insufficient documentation

## 2015-08-04 LAB — BASIC METABOLIC PANEL
Anion gap: 10 (ref 5–15)
BUN: 40 mg/dL — ABNORMAL HIGH (ref 6–20)
CHLORIDE: 93 mmol/L — AB (ref 101–111)
CO2: 35 mmol/L — AB (ref 22–32)
Calcium: 9.4 mg/dL (ref 8.9–10.3)
Creatinine, Ser: 1.53 mg/dL — ABNORMAL HIGH (ref 0.61–1.24)
GFR calc non Af Amer: 46 mL/min — ABNORMAL LOW (ref >60)
GFR, EST AFRICAN AMERICAN: 53 mL/min — AB (ref >60)
Glucose, Bld: 147 mg/dL — ABNORMAL HIGH (ref 65–99)
POTASSIUM: 4.6 mmol/L (ref 3.5–5.1)
SODIUM: 138 mmol/L (ref 135–145)

## 2015-08-04 LAB — CBC WITH DIFFERENTIAL/PLATELET
BASOS ABS: 0 10*3/uL (ref 0.0–0.1)
BASOS PCT: 0 %
Eosinophils Absolute: 0 10*3/uL (ref 0.0–0.7)
Eosinophils Relative: 0 %
HEMATOCRIT: 50.4 % (ref 39.0–52.0)
HEMOGLOBIN: 13.6 g/dL (ref 13.0–17.0)
Lymphocytes Relative: 12 %
Lymphs Abs: 1.1 10*3/uL (ref 0.7–4.0)
MCH: 20.7 pg — ABNORMAL LOW (ref 26.0–34.0)
MCHC: 27 g/dL — ABNORMAL LOW (ref 30.0–36.0)
MCV: 76.7 fL — ABNORMAL LOW (ref 78.0–100.0)
MONOS PCT: 2 %
Monocytes Absolute: 0.2 10*3/uL (ref 0.1–1.0)
NEUTROS ABS: 7.9 10*3/uL — AB (ref 1.7–7.7)
NEUTROS PCT: 86 %
Platelets: 228 10*3/uL (ref 150–400)
RBC: 6.57 MIL/uL — AB (ref 4.22–5.81)
RDW: 18.5 % — ABNORMAL HIGH (ref 11.5–15.5)
WBC: 9.2 10*3/uL (ref 4.0–10.5)

## 2015-08-04 LAB — PROTIME-INR
INR: 1.04 (ref 0.00–1.49)
PROTHROMBIN TIME: 13.8 s (ref 11.6–15.2)

## 2015-08-04 MED ORDER — TORSEMIDE 20 MG PO TABS: 20.0000 mg | ORAL_TABLET | Freq: Every day | ORAL | Status: DC

## 2015-08-04 NOTE — Patient Instructions (Signed)
Your physician has requested that you have a cardiac catheterization. Cardiac catheterization is used to diagnose and/or treat various heart conditions. Doctors may recommend this procedure for a number of different reasons. The most common reason is to evaluate chest pain. Chest pain can be a symptom of coronary artery disease (CAD), and cardiac catheterization can show whether plaque is narrowing or blocking your heart's arteries. This procedure is also used to evaluate the valves, as well as measure the blood flow and oxygen levels in different parts of your heart. For further information please visit [email or web address]. Please follow instruction sheet, as given.  Your physician recommends that you continue on your current medications as directed. Please refer to the Current Medication list given to you today.  If you need a refill on your cardiac medications before your next appointment, please call your pharmacy.  Thank you for choosing [facility]!

## 2015-08-04 NOTE — Telephone Encounter (Signed)
-----   Message from Imogene Burn, PA-C sent at 08/04/2015  4:20 PM EDT ----- Kidney function up since hospital. Decrease demedex to 20 mg 1 tablet daily. Recheck BMET on Monday before cath Tues. 2 gram sodium diet

## 2015-08-04 NOTE — Progress Notes (Signed)
Cardiology Office Note    Date:  08/04/2015   ID:  Brian Parsons, DOB 03-06-1951, MRN [id number]  PCP:  [name], PA-C  Cardiologist:  Dr. Domenic Polite  Chief complaint dyspnea on exertion  History of Present Illness:  Brian Parsons is a 65 y.o. male with recent admission for Acute on chronic diastolic CHF EF 123456. He had an abnormal EKG with significant ST/T changes ant/lateral. No chest pain and troponins negative. Lexiscan reviewed by Dr. Domenic Polite and showed RCA distribution ischemia, normal EF, intermediate risk. Cardiac cath recommended and he's here today to be scheduled. Also had recent COPD exacerbation treated,Hypertension, chronic renal insufficiency creatinine on 08/02/15 was 1.27  Patient also has polycythemia due to hypoxemia from tobacco abuse and COPD receives therapeutic phlebotomies.  Patient comes in today accompanied by his girlfriend. He denies any chest pain, palpitations, dizziness or presyncope. He has chronic dyspnea on exertion. He is currently on a steroid taper.    Past Medical History  Diagnosis Date  . Essential hypertension   . COPD (chronic obstructive pulmonary disease) ([place])   . Polycythemia   . Tobacco abuse 08/02/2015    No past surgical history on file.  Current Medications: Outpatient Prescriptions Prior to Visit  Medication Sig Dispense Refill  . ADVAIR DISKUS 250-50 MCG/DOSE AEPB USE 1 INHALATION BY MOUTH EVERY 12 HOURS. <<RINSE MOUTH AFTER USE>>. 60 each 3  . aspirin 81 MG chewable tablet Chew 1 tablet (81 mg total) by mouth daily.    Marland Kitchen ipratropium-albuterol (DUONEB) 0.5-2.5 (3) MG/3ML SOLN Take 3 mLs by nebulization every 6 (six) hours. 360 mL 1  . lisinopril (PRINIVIL,ZESTRIL) 20 MG tablet Take 0.5 tablets (10 mg total) by mouth daily. 30 tablet 1  . MAGNESIUM PO Take 1 tablet by mouth daily.    . potassium chloride (K-DUR,KLOR-CON) 10 MEQ tablet Take 10 mEq by mouth daily.    . predniSONE (DELTASONE) 10 MG tablet Take 40 mg by  mouth daily for 3 days, then take 20 mg by mouth daily for 3 days, then take 10 mg by mouth daily for 3 days. 21 tablet 0  . torsemide (DEMADEX) 20 MG tablet Take 3 tablets (60 mg total) by mouth daily. 270 tablet 1  . vitamin B-12 (CYANOCOBALAMIN) 1000 MCG tablet Take 1,000 mcg by mouth daily.     No facility-administered medications prior to visit.     Allergies:   Amoxicillin and Penicillins   Social History   Social History  . Marital Status: Divorced    Spouse Name: N/A  . Number of Children: N/A  . Years of Education: N/A   Social History Main Topics  . Smoking status: Current Every Day Smoker -- 0.50 packs/day for 50 years    Types: Cigarettes  . Smokeless tobacco: Never Used     Comment:  5 ciggs per week / every other day   . Alcohol Use: No     Comment: No EtOH for 7 years  . Drug Use: No  . Sexual Activity: Yes   Other Topics Concern  . None   Social History Narrative     Family History:  The patient's    family history includes Emphysema in his father; Stroke in his mother.   ROS:   Please see the history of present illness.    Review of Systems  Constitution: Negative.  HENT: Negative.   Eyes: Negative.   Cardiovascular: Positive for dyspnea on exertion. Negative for chest pain, claudication, cyanosis, irregular heartbeat, leg  swelling, near-syncope, orthopnea, palpitations, paroxysmal nocturnal dyspnea and syncope.  Respiratory: Positive for cough, shortness of breath and wheezing. Negative for hemoptysis, sleep disturbances due to breathing and sputum production.   Endocrine: Negative.   Musculoskeletal: Negative.   Gastrointestinal: Negative.   Neurological: Negative.    All other systems reviewed and are negative.   PHYSICAL EXAM:   VS:  BP 118/66 mmHg  Pulse 80  Ht 5' 7.5" (1.715 m)  Wt 239 lb (108.41 kg)  BMI 36.86 kg/m2   GEN: Well nourished, well developed, in no acute distress HEENT: normal Neck: no JVD, carotid bruits, or  masses Cardiac:  RRR; no murmurs, rubs, or gallops,no edema  Respiratory:  clear to auscultation bilaterally, normal work of breathing GI: soft, nontender, nondistended, + BS MS: no deformity or atrophy Skin: warm and dry, no rash Neuro:  Alert and Oriented x 3, Strength and sensation are intact Psych: euthymic mood, full affect  Wt Readings from Last 3 Encounters:  08/04/15 239 lb (108.41 kg)  08/02/15 237 lb 4.8 oz (107.639 kg)  05/28/15 247 lb 9.6 oz (112.311 kg)      Studies/Labs Reviewed:   EKG:  EKG is is not ordered today.    Recent Labs: 08/19/2014: ALT 28 07/28/2015: B Natriuretic Peptide 73.0; Hemoglobin 13.7; Platelets 228 08/02/2015: BUN 29*; Creatinine, Ser 1.27*; Potassium 4.2; Sodium 139   Lipid Panel    Component Value Date/Time   CHOL 132 07/30/2015 0441   TRIG 144 07/30/2015 0441   HDL 27* 07/30/2015 0441   CHOLHDL 4.9 07/30/2015 0441   VLDL 29 07/30/2015 0441   LDLCALC 76 07/30/2015 0441    Additional studies/ records that were reviewed today include:   No diagnostic ST segment changes to indicate ischemia.  Medium, moderate to severe intensity, inferior defect from apex to base that exhibits partial reversibility and is consistent with ischemia in the RCA distribution. There is associated diaphragmatic attenuation as well a contribute partially to this defect.  Nuclear stress EF: 60%.  This is an intermediate risk study.   Echocardiogram 07/30/2015: Study Conclusions   - Left ventricle: The cavity size was normal. Wall thickness was   increased in a pattern of mild LVH. Systolic function was normal.   The estimated ejection fraction was in the range of 60% to 65%.   Left ventricular diastolic function parameters were normal. - Aortic valve: Mildly calcified annulus. Trileaflet; mildly   thickened leaflets. Valve area (VTI): 2.72 cm^2. Valve area   (Vmax): 2.69 cm^2. - Left atrium: The atrium was mildly dilated. - Right ventricle: The cavity size  was moderately dilated. It   shares the apex with the LV. - Right atrium: The atrium was mildly dilated. - Atrial septum: No defect or patent foramen ovale was identified. - Systemic veins: IVC is dilated with normal respiratory variation,   estimated RA pressure is 8 mmHg. - Technically difficult study.      ASSESSMENT:   Abnormal nuclear stress test COPD exacerbation Tobacco abuse Hypertension Chronic diastolic heart failure Polycythemia Acute on chronic renal insufficiency  PLAN:  In order of problems listed above Patient had abnormal nuclear stress test as listed above. It's an intermediate risk study. He does not have chest pain but does have chronic dyspnea on exertion and multiple cardiac risk factors including a strong family history of CAD. Dr. Domenic Polite recommended cardiac catheterization to the patient who is agreeable. I have reviewed the risks, indications, and alternatives to angioplasty and stenting with the patient. Risks  include but are not limited to bleeding, infection, vascular injury, stroke, myocardial infection, arrhythmia, kidney injury, radiation-related injury in the case of prolonged fluoroscopy use, emergency cardiac surgery, and death. The patient understands the risks of serious complication is low (123456) and he agrees to proceed.   Patient is currently on steroids for COPD exacerbation  Smoking cessation discussed  Diastolic heart failure is currently compensated  Polycythemia is managed by periodic phlebotomies  He had acute on chronic renal insufficiency in the hospital. Last creatinine was 1.27 on 08/02/15. We'll recheck prior to cath      Medication Adjustments/Labs and Tests Ordered: Current medicines are reviewed at length with the patient today.  Concerns regarding medicines are outlined above.  Medication changes, Labs and Tests ordered today are listed in the Patient Instructions below. Patient Instructions  Your physician has requested  that you have a cardiac catheterization. Cardiac catheterization is used to diagnose and/or treat various heart conditions. Doctors may recommend this procedure for a number of different reasons. The most common reason is to evaluate chest pain. Chest pain can be a symptom of coronary artery disease (CAD), and cardiac catheterization can show whether plaque is narrowing or blocking your heart's arteries. This procedure is also used to evaluate the valves, as well as measure the blood flow and oxygen levels in different parts of your heart. For further information please visit HugeFiesta.tn. Please follow instruction sheet, as given.  Your physician recommends that you continue on your current medications as directed. Please refer to the Current Medication list given to you today.  If you need a refill on your cardiac medications before your next appointment, please call your pharmacy.  Thank you for choosing [facility]!       Sumner Boast, PA-C  08/04/2015 12:24 PM    [facility] [place], [place], [place]  [address] Phone: [phone]; Fax: [phone]

## 2015-08-09 ENCOUNTER — Other Ambulatory Visit: Payer: Self-pay

## 2015-08-09 ENCOUNTER — Other Ambulatory Visit (HOSPITAL_COMMUNITY)
Admission: RE | Admit: 2015-08-09 | Discharge: 2015-08-09 | Disposition: A | Discharge status: Home / Self Care | Payer: Medicare Other | Source: Ambulatory Visit | Attending: Physician Assistant | Admitting: Physician Assistant

## 2015-08-09 DIAGNOSIS — Z01818 Encounter for other preprocedural examination: Secondary | ICD-10-CM

## 2015-08-09 LAB — BASIC METABOLIC PANEL
Anion gap: 8 (ref 5–15)
BUN: 26 mg/dL — AB (ref 6–20)
CO2: 37 mmol/L — ABNORMAL HIGH (ref 22–32)
Calcium: 9 mg/dL (ref 8.9–10.3)
Chloride: 94 mmol/L — ABNORMAL LOW (ref 101–111)
Creatinine, Ser: 1.29 mg/dL — ABNORMAL HIGH (ref 0.61–1.24)
GFR calc Af Amer: >60 mL/min (ref >60)
GFR, EST NON AFRICAN AMERICAN: 56 mL/min — AB (ref >60)
GLUCOSE: 138 mg/dL — AB (ref 65–99)
POTASSIUM: 4.5 mmol/L (ref 3.5–5.1)
Sodium: 139 mmol/L (ref 135–145)

## 2015-08-10 ENCOUNTER — Encounter (HOSPITAL_COMMUNITY): Payer: Self-pay | Admitting: Cardiovascular Disease

## 2015-08-10 ENCOUNTER — Ambulatory Visit (HOSPITAL_COMMUNITY)
Admission: RE | Admit: 2015-08-10 | Discharge: 2015-08-10 | Disposition: A | Discharge status: Home / Self Care | Payer: Medicare Other | Source: Ambulatory Visit | Attending: Cardiovascular Disease | Admitting: Cardiovascular Disease

## 2015-08-10 ENCOUNTER — Encounter (HOSPITAL_COMMUNITY)
Admission: RE | Disposition: A | Discharge status: Home / Self Care | Payer: Self-pay | Source: Ambulatory Visit | Attending: Cardiovascular Disease

## 2015-08-10 DIAGNOSIS — Z7952 Long term (current) use of systemic steroids: Secondary | ICD-10-CM | POA: Diagnosis not present

## 2015-08-10 DIAGNOSIS — Z7982 Long term (current) use of aspirin: Secondary | ICD-10-CM | POA: Diagnosis not present

## 2015-08-10 DIAGNOSIS — R0609 Other forms of dyspnea: Secondary | ICD-10-CM | POA: Insufficient documentation

## 2015-08-10 DIAGNOSIS — I130 Hypertensive heart and chronic kidney disease with heart failure and stage 1 through stage 4 chronic kidney disease, or unspecified chronic kidney disease: Secondary | ICD-10-CM | POA: Insufficient documentation

## 2015-08-10 DIAGNOSIS — D751 Secondary polycythemia: Secondary | ICD-10-CM | POA: Insufficient documentation

## 2015-08-10 DIAGNOSIS — J449 Chronic obstructive pulmonary disease, unspecified: Secondary | ICD-10-CM | POA: Diagnosis not present

## 2015-08-10 DIAGNOSIS — Z6836 Body mass index (BMI) 36.0-36.9, adult: Secondary | ICD-10-CM | POA: Insufficient documentation

## 2015-08-10 DIAGNOSIS — Z8249 Family history of ischemic heart disease and other diseases of the circulatory system: Secondary | ICD-10-CM | POA: Diagnosis not present

## 2015-08-10 DIAGNOSIS — I10 Essential (primary) hypertension: Secondary | ICD-10-CM | POA: Diagnosis not present

## 2015-08-10 DIAGNOSIS — R9439 Abnormal result of other cardiovascular function study: Secondary | ICD-10-CM | POA: Diagnosis present

## 2015-08-10 DIAGNOSIS — Z880 Allergy status to penicillin: Secondary | ICD-10-CM | POA: Diagnosis not present

## 2015-08-10 DIAGNOSIS — F17210 Nicotine dependence, cigarettes, uncomplicated: Secondary | ICD-10-CM | POA: Insufficient documentation

## 2015-08-10 DIAGNOSIS — I5032 Chronic diastolic (congestive) heart failure: Secondary | ICD-10-CM | POA: Diagnosis not present

## 2015-08-10 DIAGNOSIS — E669 Obesity, unspecified: Secondary | ICD-10-CM | POA: Insufficient documentation

## 2015-08-10 DIAGNOSIS — R0902 Hypoxemia: Secondary | ICD-10-CM | POA: Diagnosis not present

## 2015-08-10 DIAGNOSIS — Z7951 Long term (current) use of inhaled steroids: Secondary | ICD-10-CM | POA: Insufficient documentation

## 2015-08-10 DIAGNOSIS — N189 Chronic kidney disease, unspecified: Secondary | ICD-10-CM | POA: Diagnosis not present

## 2015-08-10 DIAGNOSIS — I2510 Atherosclerotic heart disease of native coronary artery without angina pectoris: Secondary | ICD-10-CM | POA: Insufficient documentation

## 2015-08-10 HISTORY — PX: CARDIAC CATHETERIZATION: SHX172

## 2015-08-10 SURGERY — LEFT HEART CATH AND CORONARY ANGIOGRAPHY
Anesthesia: LOCAL

## 2015-08-10 MED ORDER — MIDAZOLAM HCL 2 MG/2ML IJ SOLN
INTRAMUSCULAR | Status: DC | PRN
  Administered 2015-08-10: 2 mg via INTRAVENOUS

## 2015-08-10 MED ORDER — SODIUM CHLORIDE 0.9% FLUSH: 3.0000 mL | INTRAVENOUS | Status: DC | PRN

## 2015-08-10 MED ORDER — ISOSORBIDE MONONITRATE ER 30 MG PO TB24: 30.0000 mg | ORAL_TABLET | Freq: Every day | ORAL | Status: DC

## 2015-08-10 MED ORDER — LIDOCAINE HCL (PF) 1 % IJ SOLN
INTRAMUSCULAR | Status: DC | PRN
  Administered 2015-08-10: 08:00:00 via INTRA_ARTERIAL

## 2015-08-10 MED ORDER — ASPIRIN EC 81 MG PO TBEC
81.0000 mg | DELAYED_RELEASE_TABLET | Freq: Every day | ORAL | Status: DC
Start: 2015-08-10 — End: 2015-08-10

## 2015-08-10 MED ORDER — SODIUM CHLORIDE 0.9% FLUSH: 3.0000 mL | Freq: Two times a day (BID) | INTRAVENOUS | Status: DC

## 2015-08-10 MED ORDER — HEPARIN (PORCINE) IN NACL 2-0.9 UNIT/ML-% IJ SOLN
INTRAMUSCULAR | Status: AC
  Filled 2015-08-10: qty 500

## 2015-08-10 MED ORDER — FENTANYL CITRATE (PF) 100 MCG/2ML IJ SOLN
INTRAMUSCULAR | Status: AC
  Filled 2015-08-10: qty 2

## 2015-08-10 MED ORDER — LIDOCAINE HCL (PF) 1 % IJ SOLN
INTRAMUSCULAR | Status: AC
  Filled 2015-08-10: qty 30

## 2015-08-10 MED ORDER — SODIUM CHLORIDE 0.9 % IV SOLN
INTRAVENOUS | Status: DC
  Administered 2015-08-10: 06:00:00 via INTRAVENOUS

## 2015-08-10 MED ORDER — ASPIRIN 81 MG PO CHEW: 81.0000 mg | CHEWABLE_TABLET | ORAL | Status: DC

## 2015-08-10 MED ORDER — MIDAZOLAM HCL 2 MG/2ML IJ SOLN
INTRAMUSCULAR | Status: AC
  Filled 2015-08-10: qty 2

## 2015-08-10 MED ORDER — IOPAMIDOL (ISOVUE-370) INJECTION 76%
INTRAVENOUS | Status: AC
  Filled 2015-08-10: qty 100

## 2015-08-10 MED ORDER — VERAPAMIL HCL 2.5 MG/ML IV SOLN
INTRAVENOUS | Status: AC
  Filled 2015-08-10: qty 2

## 2015-08-10 MED ORDER — HEPARIN (PORCINE) IN NACL 2-0.9 UNIT/ML-% IJ SOLN
INTRAMUSCULAR | Status: AC
  Filled 2015-08-10: qty 1000

## 2015-08-10 MED ORDER — HEPARIN (PORCINE) IN NACL 2-0.9 UNIT/ML-% IJ SOLN
INTRAMUSCULAR | Status: DC | PRN
  Administered 2015-08-10: 1500 mL

## 2015-08-10 MED ORDER — ONDANSETRON HCL 4 MG/2ML IJ SOLN: 4.0000 mg | Freq: Four times a day (QID) | INTRAMUSCULAR | Status: DC | PRN

## 2015-08-10 MED ORDER — IOPAMIDOL (ISOVUE-370) INJECTION 76%
INTRAVENOUS | Status: DC | PRN
  Administered 2015-08-10: 100 mL via INTRAVENOUS

## 2015-08-10 MED ORDER — FENTANYL CITRATE (PF) 100 MCG/2ML IJ SOLN
INTRAMUSCULAR | Status: DC | PRN
  Administered 2015-08-10: 25 ug via INTRAVENOUS

## 2015-08-10 MED ORDER — HEPARIN SODIUM (PORCINE) 1000 UNIT/ML IJ SOLN
INTRAMUSCULAR | Status: DC | PRN
  Administered 2015-08-10: 5500 [IU] via INTRAVENOUS

## 2015-08-10 MED ORDER — SODIUM CHLORIDE 0.9 % IV SOLN: INTRAVENOUS | Status: DC

## 2015-08-10 MED ORDER — LIDOCAINE HCL (PF) 1 % IJ SOLN
INTRAMUSCULAR | Status: DC | PRN
  Administered 2015-08-10: 4 mL

## 2015-08-10 MED ORDER — ACETAMINOPHEN 325 MG PO TABS: 650.0000 mg | ORAL_TABLET | ORAL | Status: DC | PRN

## 2015-08-10 MED ORDER — DIAZEPAM 5 MG PO TABS: 5.0000 mg | ORAL_TABLET | Freq: Four times a day (QID) | ORAL | Status: DC | PRN

## 2015-08-10 MED ORDER — SODIUM CHLORIDE 0.9 % IV SOLN: 250.0000 mL | INTRAVENOUS | Status: DC | PRN

## 2015-08-10 MED ORDER — NITROGLYCERIN 1 MG/10 ML FOR IR/CATH LAB
INTRA_ARTERIAL | Status: AC
  Filled 2015-08-10: qty 10

## 2015-08-10 SURGICAL SUPPLY — 11 items
CATH INFINITI 5 FR JL3.5 (CATHETERS) ×2 IMPLANT
CATH INFINITI 5FR ANG PIGTAIL (CATHETERS) ×2 IMPLANT
CATH INFINITI JR4 5F (CATHETERS) ×2 IMPLANT
DEVICE RAD COMP TR BAND LRG (VASCULAR PRODUCTS) ×4 IMPLANT
GLIDESHEATH SLEND SS 6F .021 (SHEATH) ×2 IMPLANT
KIT HEART LEFT (KITS) ×2 IMPLANT
PACK CARDIAC CATHETERIZATION (CUSTOM PROCEDURE TRAY) ×2 IMPLANT
SYR MEDRAD MARK V 150ML (SYRINGE) ×2 IMPLANT
TRANSDUCER W/STOPCOCK (MISCELLANEOUS) ×2 IMPLANT
TUBING CIL FLEX 10 FLL-RA (TUBING) ×2 IMPLANT
WIRE SAFE-T 1.5MM-J .035X260CM (WIRE) ×2 IMPLANT

## 2015-08-10 NOTE — H&P (View-Only) (Signed)
Cardiology Office Note    Date:  08/04/2015   ID:  Brian Parsons, DOB [birth year], MRN [id number]  PCP:  Brian Fairy, PA-C  Cardiologist:  Dr. Domenic Polite  Chief complaint dyspnea on exertion  History of Present Illness:  Brian Parsons is a 65 y.o. male with recent admission for Acute on chronic diastolic CHF EF 123456. He had an abnormal EKG with significant ST/T changes ant/lateral. No chest pain and troponins negative. Lexiscan reviewed by Dr. Domenic Polite and showed RCA distribution ischemia, normal EF, intermediate risk. Cardiac cath recommended and he's here today to be scheduled. Also had recent COPD exacerbation treated,Hypertension, chronic renal insufficiency creatinine on 08/02/15 was 1.27  Patient also has polycythemia due to hypoxemia from tobacco abuse and COPD receives therapeutic phlebotomies.  Patient comes in today accompanied by his girlfriend. He denies any chest pain, palpitations, dizziness or presyncope. He has chronic dyspnea on exertion. He is currently on a steroid taper.    Past Medical History  Diagnosis Date  . Essential hypertension   . COPD (chronic obstructive pulmonary disease) ([place])   . Polycythemia   . Tobacco abuse 08/02/2015    No past surgical history on file.  Current Medications: Outpatient Prescriptions Prior to Visit  Medication Sig Dispense Refill  . ADVAIR DISKUS 250-50 MCG/DOSE AEPB USE 1 INHALATION BY MOUTH EVERY 12 HOURS. <<RINSE MOUTH AFTER USE>>. 60 each 3  . aspirin 81 MG chewable tablet Chew 1 tablet (81 mg total) by mouth daily.    Marland Kitchen ipratropium-albuterol (DUONEB) 0.5-2.5 (3) MG/3ML SOLN Take 3 mLs by nebulization every 6 (six) hours. 360 mL 1  . lisinopril (PRINIVIL,ZESTRIL) 20 MG tablet Take 0.5 tablets (10 mg total) by mouth daily. 30 tablet 1  . MAGNESIUM PO Take 1 tablet by mouth daily.    . potassium chloride (K-DUR,KLOR-CON) 10 MEQ tablet Take 10 mEq by mouth daily.    . predniSONE (DELTASONE) 10 MG tablet Take 40 mg by  mouth daily for 3 days, then take 20 mg by mouth daily for 3 days, then take 10 mg by mouth daily for 3 days. 21 tablet 0  . torsemide (DEMADEX) 20 MG tablet Take 3 tablets (60 mg total) by mouth daily. 270 tablet 1  . vitamin B-12 (CYANOCOBALAMIN) 1000 MCG tablet Take 1,000 mcg by mouth daily.     No facility-administered medications prior to visit.     Allergies:   Amoxicillin and Penicillins   Social History   Social History  . Marital Status: Divorced    Spouse Name: N/A  . Number of Children: N/A  . Years of Education: N/A   Social History Main Topics  . Smoking status: Current Every Day Smoker -- 0.50 packs/day for 50 years    Types: Cigarettes  . Smokeless tobacco: Never Used     Comment:  5 ciggs per week / every other day   . Alcohol Use: No     Comment: No EtOH for 7 years  . Drug Use: No  . Sexual Activity: Yes   Other Topics Concern  . None   Social History Narrative     Family History:  The patient's    family history includes Emphysema in his father; Stroke in his mother.   ROS:   Please see the history of present illness.    Review of Systems  Constitution: Negative.  HENT: Negative.   Eyes: Negative.   Cardiovascular: Positive for dyspnea on exertion. Negative for chest pain, claudication, cyanosis, irregular heartbeat, leg  swelling, near-syncope, orthopnea, palpitations, paroxysmal nocturnal dyspnea and syncope.  Respiratory: Positive for cough, shortness of breath and wheezing. Negative for hemoptysis, sleep disturbances due to breathing and sputum production.   Endocrine: Negative.   Musculoskeletal: Negative.   Gastrointestinal: Negative.   Neurological: Negative.    All other systems reviewed and are negative.   PHYSICAL EXAM:   VS:  BP 118/66 mmHg  Pulse 80  Ht 5' 7.5" (1.715 m)  Wt 239 lb (108.41 kg)  BMI 36.86 kg/m2   GEN: Well nourished, well developed, in no acute distress HEENT: normal Neck: no JVD, carotid bruits, or  masses Cardiac:  RRR; no murmurs, rubs, or gallops,no edema  Respiratory:  clear to auscultation bilaterally, normal work of breathing GI: soft, nontender, nondistended, + BS MS: no deformity or atrophy Skin: warm and dry, no rash Neuro:  Alert and Oriented x 3, Strength and sensation are intact Psych: euthymic mood, full affect  Wt Readings from Last 3 Encounters:  08/04/15 239 lb (108.41 kg)  08/02/15 237 lb 4.8 oz (107.639 kg)  05/28/15 247 lb 9.6 oz (112.311 kg)      Studies/Labs Reviewed:   EKG:  EKG is is not ordered today.    Recent Labs: 08/19/2014: ALT 28 07/28/2015: B Natriuretic Peptide 73.0; Hemoglobin 13.7; Platelets 228 08/02/2015: BUN 29*; Creatinine, Ser 1.27*; Potassium 4.2; Sodium 139   Lipid Panel    Component Value Date/Time   CHOL 132 07/30/2015 0441   TRIG 144 07/30/2015 0441   HDL 27* 07/30/2015 0441   CHOLHDL 4.9 07/30/2015 0441   VLDL 29 07/30/2015 0441   LDLCALC 76 07/30/2015 0441    Additional studies/ records that were reviewed today include:   No diagnostic ST segment changes to indicate ischemia.  Medium, moderate to severe intensity, inferior defect from apex to base that exhibits partial reversibility and is consistent with ischemia in the RCA distribution. There is associated diaphragmatic attenuation as well a contribute partially to this defect.  Nuclear stress EF: 60%.  This is an intermediate risk study.   Echocardiogram 07/30/2015: Study Conclusions   - Left ventricle: The cavity size was normal. Wall thickness was   increased in a pattern of mild LVH. Systolic function was normal.   The estimated ejection fraction was in the range of 60% to 65%.   Left ventricular diastolic function parameters were normal. - Aortic valve: Mildly calcified annulus. Trileaflet; mildly   thickened leaflets. Valve area (VTI): 2.72 cm^2. Valve area   (Vmax): 2.69 cm^2. - Left atrium: The atrium was mildly dilated. - Right ventricle: The cavity size  was moderately dilated. It   shares the apex with the LV. - Right atrium: The atrium was mildly dilated. - Atrial septum: No defect or patent foramen ovale was identified. - Systemic veins: IVC is dilated with normal respiratory variation,   estimated RA pressure is 8 mmHg. - Technically difficult study.      ASSESSMENT:   Abnormal nuclear stress test COPD exacerbation Tobacco abuse Hypertension Chronic diastolic heart failure Polycythemia Acute on chronic renal insufficiency  PLAN:  In order of problems listed above Patient had abnormal nuclear stress test as listed above. It's an intermediate risk study. He does not have chest pain but does have chronic dyspnea on exertion and multiple cardiac risk factors including a strong family history of CAD. Dr. Domenic Polite recommended cardiac catheterization to the patient who is agreeable. I have reviewed the risks, indications, and alternatives to angioplasty and stenting with the patient. Risks  include but are not limited to bleeding, infection, vascular injury, stroke, myocardial infection, arrhythmia, kidney injury, radiation-related injury in the case of prolonged fluoroscopy use, emergency cardiac surgery, and death. The patient understands the risks of serious complication is low (123456) and he agrees to proceed.   Patient is currently on steroids for COPD exacerbation  Smoking cessation discussed  Diastolic heart failure is currently compensated  Polycythemia is managed by periodic phlebotomies  He had acute on chronic renal insufficiency in the hospital. Last creatinine was 1.27 on 08/02/15. We'll recheck prior to cath      Medication Adjustments/Labs and Tests Ordered: Current medicines are reviewed at length with the patient today.  Concerns regarding medicines are outlined above.  Medication changes, Labs and Tests ordered today are listed in the Patient Instructions below. Patient Instructions  Your physician has requested  that you have a cardiac catheterization. Cardiac catheterization is used to diagnose and/or treat various heart conditions. Doctors may recommend this procedure for a number of different reasons. The most common reason is to evaluate chest pain. Chest pain can be a symptom of coronary artery disease (CAD), and cardiac catheterization can show whether plaque is narrowing or blocking your heart's arteries. This procedure is also used to evaluate the valves, as well as measure the blood flow and oxygen levels in different parts of your heart. For further information please visit HugeFiesta.tn. Please follow instruction sheet, as given.  Your physician recommends that you continue on your current medications as directed. Please refer to the Current Medication list given to you today.  If you need a refill on your cardiac medications before your next appointment, please call your pharmacy.  Thank you for choosing [facility]!       Sumner Boast, PA-C  08/04/2015 12:24 PM    [facility] [place], [place], [place]  [address] Phone: [phone]; Fax: [phone]

## 2015-08-10 NOTE — Research (Signed)
[facility] Informed Consent   Subject Name: Brian Parsons  Subject met inclusion and exclusion criteria.  The informed consent form, study requirements and expectations were reviewed with the subject and questions and concerns were addressed prior to the signing of the consent form.  The subject verbalized understanding of the trial requirements.  The subject agreed to participate in the Day trial and signed the informed consent.  The informed consent was obtained prior to performance of any protocol-specific procedures for the subject.  A copy of the signed informed consent was given to the subject and a copy was placed in the subject's medical record.  Marlana Salvage 08/10/2015, 06:55 A.M

## 2015-08-10 NOTE — Interval H&P Note (Signed)
Cath Lab Visit (complete for each Cath Lab visit)  Clinical Evaluation Leading to the Procedure:   ACS: No.  Non-ACS:    Anginal Classification: CCS III  Anti-ischemic medical therapy: No Therapy  Non-Invasive Test Results: Intermediate-risk stress test findings: cardiac mortality 1-3%/year  Prior CABG: No previous CABG      History and Physical Interval Note:  08/10/2015 7:53 AM  Brian Parsons  has presented today for surgery, with the diagnosis of abnormal stress test  The various methods of treatment have been discussed with the patient and family. After consideration of risks, benefits and other options for treatment, the patient has consented to  Procedure(s): Left Heart Cath and Coronary Angiography (N/A) as a surgical intervention .  The patient's history has been reviewed, patient examined, no change in status, stable for surgery.  I have reviewed the patient's chart and labs.  Questions were answered to the patient's satisfaction.     KELLY,THOMAS A

## 2015-08-10 NOTE — Discharge Instructions (Signed)
Radial Site Care °Refer to this sheet in the next few weeks. These instructions provide you with information about caring for yourself after your procedure. Your health care provider may also give you more specific instructions. Your treatment has been planned according to current medical practices, but problems sometimes occur. Call your health care provider if you have any problems or questions after your procedure. °WHAT TO EXPECT AFTER THE PROCEDURE °After your procedure, it is typical to have the following: °· Bruising at the radial site that usually fades within 1-2 weeks. °· Blood collecting in the tissue (hematoma) that may be painful to the touch. It should usually decrease in size and tenderness within 1-2 weeks. °HOME CARE INSTRUCTIONS °· Take medicines only as directed by your health care provider. °· You may shower 24-48 hours after the procedure or as directed by your health care provider. Remove the bandage (dressing) and gently wash the site with plain soap and water. Pat the area dry with a clean towel. Do not rub the site, because this may cause bleeding. °· Do not take baths, swim, or use a hot tub until your health care provider approves. °· Check your insertion site every day for redness, swelling, or drainage. °· Do not apply powder or lotion to the site. °· Do not flex or bend the affected arm for 24 hours or as directed by your health care provider. °· Do not push or pull heavy objects with the affected arm for 24 hours or as directed by your health care provider. °· Do not lift over 10 lb (4.5 kg) for 5 days after your procedure or as directed by your health care provider. °· Ask your health care provider when it is okay to: °¨ Return to work or school. °¨ Resume usual physical activities or sports. °¨ Resume sexual activity. °· Do not drive home if you are discharged the same day as the procedure. Have someone else drive you. °· You may drive 24 hours after the procedure unless otherwise  instructed by your health care provider. °· Do not operate machinery or power tools for 24 hours after the procedure. °· If your procedure was done as an outpatient procedure, which means that you went home the same day as your procedure, a responsible adult should be with you for the first 24 hours after you arrive home. °· Keep all follow-up visits as directed by your health care provider. This is important. °SEEK MEDICAL CARE IF: °· You have a fever. °· You have chills. °· You have increased bleeding from the radial site. Hold pressure on the site. °SEEK IMMEDIATE MEDICAL CARE IF: °· You have unusual pain at the radial site. °· You have redness, warmth, or swelling at the radial site. °· You have drainage (other than a small amount of blood on the dressing) from the radial site. °· The radial site is bleeding, and the bleeding does not stop after 30 minutes of holding steady pressure on the site. °· Your arm or hand becomes pale, cool, tingly, or numb. °  °This information is not intended to replace advice given to you by your health care provider. Make sure you discuss any questions you have with your health care provider. °  °Document Released: 05/06/2010 Document Revised: 04/24/2014 Document Reviewed: 10/20/2013 °Elsevier Interactive Patient Education ©2016 [employer] ° °

## 2015-08-10 NOTE — Progress Notes (Signed)
Per Rhonda,PA client may start Imdur tomorrow

## 2015-08-13 ENCOUNTER — Telehealth: Payer: Self-pay | Admitting: Cardiology

## 2015-08-13 NOTE — Telephone Encounter (Signed)
He was taken to Demadex 60 mg daily at the recent office visit with Ms. Bonnell Public PA-C prior to cardiac catheterization. His cardiac catheterization was overall reassuring without obstructive CAD. Would continue Demadex at 60 mg daily for now.

## 2015-08-13 NOTE — Telephone Encounter (Signed)
Patient notified to continue taking Torsemide at  60 mg daily

## 2015-08-13 NOTE — Telephone Encounter (Signed)
Pt is wondering how many torsemide (DEMADEX) 20 MG tablet [id number] he's supposed to be taking now since having his heart cath.

## 2015-08-16 ENCOUNTER — Telehealth: Payer: Self-pay | Admitting: Cardiology

## 2015-08-16 NOTE — Telephone Encounter (Signed)
Pt called stating he's retaining fluid in his legs & feet again

## 2015-08-16 NOTE — Telephone Encounter (Signed)
See p[hone note from 08/13/15,pt called to say swelling is up to his knees,has not weighed self and is not home to do so at this time.i offered him today an apt but he declined,stating he had a lot of things to do today.He made apt for tomorrow to See NP

## 2015-08-17 ENCOUNTER — Ambulatory Visit (INDEPENDENT_AMBULATORY_CARE_PROVIDER_SITE_OTHER): Payer: Medicare Other | Admitting: Adult Health

## 2015-08-17 ENCOUNTER — Encounter: Payer: Self-pay | Admitting: Adult Health

## 2015-08-17 VITALS — BP 113/70 | HR 80 | Ht 67.5 in | Wt 244.0 lb

## 2015-08-17 DIAGNOSIS — I2510 Atherosclerotic heart disease of native coronary artery without angina pectoris: Secondary | ICD-10-CM | POA: Diagnosis not present

## 2015-08-17 DIAGNOSIS — I5033 Acute on chronic diastolic (congestive) heart failure: Secondary | ICD-10-CM | POA: Diagnosis not present

## 2015-08-17 DIAGNOSIS — Z720 Tobacco use: Secondary | ICD-10-CM | POA: Diagnosis not present

## 2015-08-17 MED ORDER — NICOTINE 21 MG/24HR TD PT24: 21.0000 mg | MEDICATED_PATCH | Freq: Every day | TRANSDERMAL | Status: DC

## 2015-08-17 MED ORDER — TORSEMIDE 20 MG PO TABS: 20.0000 mg | ORAL_TABLET | Freq: Two times a day (BID) | ORAL | Status: DC

## 2015-08-17 MED ORDER — TORSEMIDE 20 MG PO TABS: 60.0000 mg | ORAL_TABLET | Freq: Every day | ORAL | Status: DC

## 2015-08-17 MED ORDER — METOLAZONE 2.5 MG PO TABS: 5.0000 mg | ORAL_TABLET | Freq: Every day | ORAL | Status: DC

## 2015-08-17 NOTE — Patient Instructions (Addendum)
Your physician recommends that you schedule a follow-up appointment in: 2 Weeks with Jory Sims, NP.  Your physician has recommended you make the following change in your medication:   STOP TAKING ISOSORBIDE  TAKE METOLAZONE 2.5 MG ONCE  ON THE DAY YOU METOLAZONE TAKE THREE POTASSIUM ON THAT DAY ONLY.  Your physician recommends that you return for lab work when you are seen in oncology.   If you need a refill on your cardiac medications before your next appointment, please call your pharmacy.  Thank you for choosing [facility]!

## 2015-08-17 NOTE — Progress Notes (Addendum)
Cardiology Office Note   Date:  08/17/2015   ID:  Brian Parsons, DOB 02/10/1951, MRN [id number]  PCP:  [name], PA-C  Cardiologist: [place]/  Jory Sims, NP   No chief complaint on file.     History of Present Illness: Brian Parsons is a 65 y.o. male who presents for ongoing assessment and management of chronic diastolic CHF, EF [id number], abnormal EKG, with recent LexiScan Myoview revealing RCA distribution ischemia and found to be in an intermediate risk. He was sent for cardiac catheterization.on 08/10/2015 catheterization revealed mild CAD with normal LAD 30% ostial narrowing in the OM 1 vessel, of the left circumflex artery with 40% distal tubular circumflex stenosis, and a large dominant RCA with mild 20% mid and distal stenosis. Preserved global LV function with EF of 55%, LVEDP of 15 mm mercury. He was recommended for medical therapy and smoking cessation.  Since being seen last, the patient has gained 6 pounds has begun to retain fluid having abdominal distention and lower extremity edema. The patient was recently placed on isosorbide 30 mg daily. He states his torsemide is no longer helping him concerning his fluid retention. At one point he was only taking 20 mg torsemide daily and called our office on 08/13/2015 and was told to go back up on his torsemide to 60 mg daily as he been taking previously. However because he gained fluid he wasn't able to see much results. He states he's been having some mild dyspnea on exertion. He has been trying to quit smoking but does not have the money to afford nicotine patches..     Past Medical History  Diagnosis Date  . Essential hypertension   . COPD (chronic obstructive pulmonary disease) ([place])   . Polycythemia   . Tobacco abuse 08/02/2015    Past Surgical History  Procedure Laterality Date  . Cardiac catheterization N/A 08/10/2015    Procedure: Left Heart Cath and Coronary Angiography;  Surgeon: Troy Sine, MD;  Location:  [facility];  Service: Cardiovascular;  Laterality: N/A;     Current Outpatient Prescriptions  Medication Sig Dispense Refill  . ADVAIR DISKUS 250-50 MCG/DOSE AEPB USE 1 INHALATION BY MOUTH EVERY 12 HOURS. <<RINSE MOUTH AFTER USE>>. 60 each 3  . aspirin EC 81 MG tablet Take 81 mg by mouth daily.    Marland Kitchen ipratropium-albuterol (DUONEB) 0.5-2.5 (3) MG/3ML SOLN Take 3 mLs by nebulization every 6 (six) hours. (Patient taking differently: Take 3 mLs by nebulization 2 (two) times daily as needed (for wheezing or shortness of breath). ) 360 mL 1  . isosorbide mononitrate (IMDUR) 30 MG 24 hr tablet Take 1 tablet (30 mg total) by mouth daily. 30 tablet 11  . lisinopril (PRINIVIL,ZESTRIL) 20 MG tablet Take 0.5 tablets (10 mg total) by mouth daily. 30 tablet 1  . MAGNESIUM PO Take 1 tablet by mouth daily.    . potassium chloride (K-DUR,KLOR-CON) 10 MEQ tablet Take 10 mEq by mouth daily.    Marland Kitchen torsemide (DEMADEX) 20 MG tablet Take 1 tablet (20 mg total) by mouth daily. 90 tablet 3  . vitamin B-12 (CYANOCOBALAMIN) 1000 MCG tablet Take 1,000 mcg by mouth daily.     No current facility-administered medications for this visit.    Allergies:   Amoxicillin and Penicillins    Social History:  The patient  reports that he has been smoking Cigarettes.  He has a 25 pack-year smoking history. He has never used smokeless tobacco. He reports that he does not drink  alcohol or use illicit drugs.   Family History:  The patient's family history includes Emphysema in his father; Stroke in his mother.    ROS: All other systems are reviewed and negative. Unless otherwise mentioned in H&P    PHYSICAL EXAM: VS:  BP 113/70 mmHg  Pulse 80  Ht 5' 7.5" (1.715 m)  Wt 244 lb (110.678 kg)  BMI 37.63 kg/m2  SpO2 90% , BMI Body mass index is 37.63 kg/(m^2). GEN: Well nourished, well developed, in no acute distress HEENT: normal Neck: no JVD, carotid bruits, or masses Cardiac: RRR; no murmurs, rubs, or gallops,no  edema  Respiratory:  Clear to auscultation bilaterally, normal work of breathing GI: soft, nontender, nondistended, + BS MS: no deformity or atrophy Skin: warm and dry, no rash Neuro:  Strength and sensation are intact Psych: euthymic mood, full affect  Recent Labs: 08/19/2014: ALT 28 07/28/2015: B Natriuretic Peptide 73.0 08/04/2015: Hemoglobin 13.6; Platelets 228 08/09/2015: BUN 26*; Creatinine, Ser 1.29*; Potassium 4.5; Sodium 139    Lipid Panel    Component Value Date/Time   CHOL 132 07/30/2015 0441   TRIG 144 07/30/2015 0441   HDL 27* 07/30/2015 0441   CHOLHDL 4.9 07/30/2015 0441   VLDL 29 07/30/2015 0441   LDLCALC 76 07/30/2015 0441      Wt Readings from Last 3 Encounters:  08/17/15 244 lb (110.678 kg)  08/10/15 239 lb (108.41 kg)  08/04/15 239 lb (108.41 kg)    ASSESSMENT AND PLAN:  1. Acute on chronic diastolic CHF: the patient is put on 6 pounds with lower extremity edema and abdominal distention since catheterization. He is noticing that his torsemide had not been as effective. He apparently was not given the higher does of torsemide he been taking at home at 60 mg a day, he was only told to take 20 mg a day. He has since gone back up on the 60 mg a day but has continued to gain fluid weight.  I will give him one dose of metolazone 2.5 mg x1, he will take 3 tablets of potassium for a total of 30 mEq that day. He is given a low sodium diet. He will continue to take torsemide 60 mg daily thereafter with potassium 10 mEq daily. A followup BMET will be drawn in one week. We'll see him in 2 weeks.  2. [id number]. He has no chest discomfort or fatigue. Due to lower extremity edema, which may also be contributed to by nitrates. I will discontinue his isosorbide.  3. Tobacco abuse:the patient would like help in quitting. He has been smoking since a teenager, has a history of COPD, and is unable to afford any nicotine patches or medication supplements to help him to quit.  We are going to refer him to [facility] for medication assistance.  4. COPD:he will follow with primary care for ongoing management.   Current medicines are reviewed at length with the patient today.    Labs/ tests ordered today include: BMET  No orders of the defined types were placed in this encounter.     Disposition:   FU with  2 weeks.    Signed, Jory Sims, NP  08/17/2015 2:34 PM    [facility] [address] [place], [place] [address] Phone: [phone]; Fax: [phone]

## 2015-08-17 NOTE — Progress Notes (Deleted)
Name: Brian Parsons    DOB: June 18, 1950  Age: 65 y.o.  MR#: [id number]       PCP:  [name], PA-C      Insurance: Payor: MEDICARE / Plan: MEDICARE PART A AND B / Product Type: *No Product type* /   CC:   No chief complaint on file.   VS Filed Vitals:   08/17/15 1427  BP: 113/70  Pulse: 80  Height: 5' 7.5" (1.715 m)  Weight: 244 lb (110.678 kg)  SpO2: 90%    Weights Current Weight  08/17/15 244 lb (110.678 kg)  08/10/15 239 lb (108.41 kg)  08/04/15 239 lb (108.41 kg)    Blood Pressure  BP Readings from Last 3 Encounters:  08/17/15 113/70  08/10/15 104/62  08/04/15 118/66     Admit date:  (Not on file) Last encounter with RMR:  Visit date not found   Allergy Amoxicillin and Penicillins  Current Outpatient Prescriptions  Medication Sig Dispense Refill  . ADVAIR DISKUS 250-50 MCG/DOSE AEPB USE 1 INHALATION BY MOUTH EVERY 12 HOURS. <<RINSE MOUTH AFTER USE>>. 60 each 3  . aspirin EC 81 MG tablet Take 81 mg by mouth daily.    Marland Kitchen ipratropium-albuterol (DUONEB) 0.5-2.5 (3) MG/3ML SOLN Take 3 mLs by nebulization every 6 (six) hours. (Patient taking differently: Take 3 mLs by nebulization 2 (two) times daily as needed (for wheezing or shortness of breath). ) 360 mL 1  . isosorbide mononitrate (IMDUR) 30 MG 24 hr tablet Take 1 tablet (30 mg total) by mouth daily. 30 tablet 11  . lisinopril (PRINIVIL,ZESTRIL) 20 MG tablet Take 0.5 tablets (10 mg total) by mouth daily. 30 tablet 1  . MAGNESIUM PO Take 1 tablet by mouth daily.    . potassium chloride (K-DUR,KLOR-CON) 10 MEQ tablet Take 10 mEq by mouth daily.    Marland Kitchen torsemide (DEMADEX) 20 MG tablet Take 1 tablet (20 mg total) by mouth daily. 90 tablet 3  . vitamin B-12 (CYANOCOBALAMIN) 1000 MCG tablet Take 1,000 mcg by mouth daily.     No current facility-administered medications for this visit.    Discontinued Meds:    Medications Discontinued During This Encounter  Medication Reason  . predniSONE (DELTASONE) 10 MG tablet  Error    Patient Active Problem List   Diagnosis Date Noted  . Exertional dyspnea   . Abnormal nuclear stress test 08/04/2015  . Chronic diastolic heart failure ([place]) 08/04/2015  . Essential hypertension 08/04/2015  . Polycythemia 08/04/2015  . COPD exacerbation ([place]) 08/04/2015  . Tobacco abuse 08/02/2015  . Acute on chronic renal insufficiency ([place]) 07/28/2015  . Acute diastolic CHF (congestive heart failure) ([place]) 07/28/2015  . Acute respiratory failure with hypoxia ([place]) 08/04/2014  . COPD (chronic obstructive pulmonary disease) ([place]) 08/03/2014  . Hypoxia 08/03/2014  . Lower extremity edema 08/03/2014  . Elevated troponin 08/03/2014  . Dyspnea 08/03/2014    LABS    Component Value Date/Time   NA 139 08/09/2015 1100   NA 138 08/04/2015 1255   NA 139 08/02/2015 0639   K 4.5 08/09/2015 1100   K 4.6 08/04/2015 1255   K 4.2 08/02/2015 0639   CL 94* 08/09/2015 1100   CL 93* 08/04/2015 1255   CL 91* 08/02/2015 0639   CO2 37* 08/09/2015 1100   CO2 35* 08/04/2015 1255   CO2 38* 08/02/2015 0639   GLUCOSE 138* 08/09/2015 1100   GLUCOSE 147* 08/04/2015 1255   GLUCOSE 97 08/02/2015 0639   BUN 26* 08/09/2015 1100  BUN 40* 08/04/2015 1255   BUN 29* 08/02/2015 0639   CREATININE 1.29* 08/09/2015 1100   CREATININE 1.53* 08/04/2015 1255   CREATININE 1.27* 08/02/2015 0639   CALCIUM 9.0 08/09/2015 1100   CALCIUM 9.4 08/04/2015 1255   CALCIUM 9.3 08/02/2015 0639   GFRNONAA 56* 08/09/2015 1100   GFRNONAA 46* 08/04/2015 1255   GFRNONAA 57* 08/02/2015 0639   GFRAA >60 08/09/2015 1100   GFRAA 53* 08/04/2015 1255   GFRAA >60 08/02/2015 0639   CMP     Component Value Date/Time   NA 139 08/09/2015 1100   K 4.5 08/09/2015 1100   CL 94* 08/09/2015 1100   CO2 37* 08/09/2015 1100   GLUCOSE 138* 08/09/2015 1100   BUN 26* 08/09/2015 1100   CREATININE 1.29* 08/09/2015 1100   CALCIUM 9.0 08/09/2015 1100   PROT 6.4* 08/19/2014 1600   ALBUMIN 3.7 08/19/2014 1600   AST 21  08/19/2014 1600   ALT 28 08/19/2014 1600   ALKPHOS 75 08/19/2014 1600   BILITOT 0.5 08/19/2014 1600   GFRNONAA 56* 08/09/2015 1100   GFRAA >60 08/09/2015 1100       Component Value Date/Time   WBC 9.2 08/04/2015 1255   WBC 9.3 07/28/2015 1944   WBC 8.8 07/28/2015 1308   HGB 13.6 08/04/2015 1255   HGB 13.7 07/28/2015 1944   HGB 13.8 07/28/2015 1308   HCT 50.4 08/04/2015 1255   HCT 49.6 07/28/2015 1944   HCT 49.3 07/28/2015 1308   MCV 76.7* 08/04/2015 1255   MCV 76.0* 07/28/2015 1944   MCV 75.7* 07/28/2015 1308    Lipid Panel     Component Value Date/Time   CHOL 132 07/30/2015 0441   TRIG 144 07/30/2015 0441   HDL 27* 07/30/2015 0441   CHOLHDL 4.9 07/30/2015 0441   VLDL 29 07/30/2015 0441   LDLCALC 76 07/30/2015 0441    ABG    Component Value Date/Time   PHART 7.352 08/03/2014 1620   PCO2ART 68.2* 08/03/2014 1620   PO2ART 52.3* 08/03/2014 1620   HCO3 36.9* 08/03/2014 1620   TCO2 30.3 08/03/2014 1620   O2SAT 86.0 08/03/2014 1620     Lab Results  Component Value Date   TSH 1.098 08/03/2014   BNP (last 3 results)  Recent Labs  07/28/15 1308  BNP 73.0    ProBNP (last 3 results) No results for input(s): PROBNP in the last 8760 hours.  Cardiac Panel (last 3 results) No results for input(s): CKTOTAL, CKMB, TROPONINI, RELINDX in the last 72 hours.  Iron/TIBC/Ferritin/ %Sat    Component Value Date/Time   FERRITIN 7* 05/05/2015 0926     EKG Orders placed or performed during the hospital encounter of 07/28/15  . ED EKG within 10 minutes  . EKG 12-Lead  . EKG 12-Lead  . EKG 12-Lead  . EKG  . EKG 12-Lead  . EKG 12-Lead  . EKG 12-Lead  . EKG 12-Lead  . EKG 12-Lead  . EKG 12-Lead  . EKG 12-Lead  . EKG 12-Lead     Prior Assessment and Plan Problem List as of 08/17/2015      Cardiovascular and Mediastinum   Acute diastolic CHF (congestive heart failure) ([facility])   Chronic diastolic heart failure ([facility])   Essential hypertension     Respiratory    COPD (chronic obstructive pulmonary disease) ([facility])   Hypoxia   Acute respiratory failure with hypoxia ([facility])   COPD exacerbation ([place])     Genitourinary   Acute on chronic renal insufficiency ([place])  Other   Lower extremity edema   Elevated troponin   Dyspnea   Tobacco abuse   Last Assessment & Plan 08/03/2015 Office Visit Written 08/02/2015  9:27 PM by [facility], PA-C    He is cutting back on tobacco abuse and he is supported and encouraged to continue; with the end goal of complete cessation.  He report 3 cigarettes per week currently.  He continues to work on decreasing tobacco abuse.   A lung cancer screening counseling and shared decision making visit. Age: 29 Pack year smoking history: ** Current smoker or < 15 years of cessation ** No current symptoms of lung cancer Risks and benefits of lung cancer screening discussed:  Negative- over-diagnosis, radiation exposure, false positives, and additional testing  Positive- discover early stage lung cancer resulting in higher incidence of cure Patient educated regarding the importance of adherence to continued lung cancer screening. Currently, there are no co-morbidities to prevent treatment to therapy for lung cancer and the patient is agreeable to pursue treatment if a malignancy is discovered.  [place] Preventative Services Task Force recommend annual screening for lung cancer with low-dose CT in adults aged 64- 48 years who have a 30 pack year smoking history and currently smoke or have quit smoking within the past 15 years.  Screening should be discontinued once a person has not smoked for 15 years or develops a health problem that substantially limits life expectancy or the ability or willingness to have curative lung surgery.  It is a category B recommendation.  Similar stances are provided by CMS, NCCN, and AATS.  Currently, there are no co-morbidities to prevent treatment to therapy for lung cancer and the patient is agreeable  to pursue treatment if a malignancy is discovered.       Abnormal nuclear stress test   Polycythemia   Exertional dyspnea       Imaging: Dg Chest 2 View  07/28/2015  CLINICAL DATA:  Shortness of breath, COPD, CHF EXAM: CHEST  2 VIEW COMPARISON:  05/26/2015 FINDINGS: Cardiomegaly with vascular congestion. No overt edema. No confluent opacities or effusions. No acute bony abnormality. IMPRESSION: Cardiomegaly, vascular congestion. Electronically Signed   By: Rolm Baptise M.D.   On: 07/28/2015 13:39   Nm Myocar Multi W/spect W/wall Motion / Ef  08/02/2015   No diagnostic ST segment changes to indicate ischemia.  Medium, moderate to severe intensity, inferior defect from apex to base that exhibits partial reversibility and is consistent with ischemia in the RCA distribution. There is associated diaphragmatic attenuation as well a contribute partially to this defect.  Nuclear stress EF: 60%.  This is an intermediate risk study.

## 2015-08-17 NOTE — Addendum Note (Signed)
Addended by: Levonne Hubert on: 08/17/2015 03:42 PM   Modules accepted: Orders

## 2015-08-18 ENCOUNTER — Telehealth: Payer: Self-pay | Admitting: *Deleted

## 2015-08-18 NOTE — Telephone Encounter (Signed)
Patient called to report that he does not think his Metolazone is working. Patient states that he took his one tablet this morning with his Potassium and Torsemide. Pt weight today at home is 240 lb at 1530. Pt states he has not had an increase in urination today. Please advise.

## 2015-08-19 NOTE — Telephone Encounter (Signed)
Patient given instructions and voiced understanding. Patient states that today's wt is 237 lb.

## 2015-08-19 NOTE — Telephone Encounter (Signed)
He has lost 4 lbs, compared to his weight in the office. Decrease fluid intake. Continue torsemide as directed. Low salt diet. Will not give another metolazone. Make sure he has a BMET scheduled. I think this was already ordered.

## 2015-08-23 NOTE — Assessment & Plan Note (Addendum)
He is cutting back on tobacco abuse and he is supported and encouraged to continue; with the end goal of complete cessation.  He report 5-6 cigarettes per week currently.  He continues to work on decreasing tobacco abuse; now with the assistance of Nicoderm managed by primary care provider.  I have discussed lung cancer screening with the patient, and at this time, he wants to consider this surveillance option.  He relays this story to me of a friend with lung cancer and he was a rare smoker.  For this reason he wants to think about this surveillance option.  He is unable to give me a better answer to my question: "What about this story makes you want to wait on CT screening."   A lung cancer screening counseling and shared decision making visit. Age: 65 Pack year smoking history: 50 pack years Current smoker or < 15 years of cessation: Current smoker of tobacco/cigarettes. No current symptoms of lung cancer Risks and benefits of lung cancer screening discussed:  Negative- over-diagnosis, radiation exposure, false positives, and additional testing  Positive- discover early stage lung cancer resulting in higher incidence of cure Patient educated regarding the importance of adherence to continued lung cancer screening. Currently, there are no co-morbidities to prevent treatment to therapy for lung cancer and the patient is agreeable to pursue treatment if a malignancy is discovered.  [place] Preventative Services Task Force recommend annual screening for lung cancer with low-dose CT in adults aged 43- 25 years who have a 30 pack year smoking history and currently smoke or have quit smoking within the past 15 years.  Screening should be discontinued once a person has not smoked for 15 years or develops a health problem that substantially limits life expectancy or the ability or willingness to have curative lung surgery.  It is a category B recommendation.  Similar stances are provided by CMS, NCCN, and  AATS.  Currently, there are no co-morbidities to prevent treatment to therapy for lung cancer and the patient is agreeable to pursue treatment if a malignancy is discovered.

## 2015-08-23 NOTE — Progress Notes (Signed)
[name], PA-C [address] [place] [address]  Polycythemia, secondary  Tobacco abuse  CURRENT THERAPY: Therapeutic phlebotomy to maintain HCT 50% or less and smoking cessation.  INTERVAL HISTORY: Brian Parsons 65 y.o. 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  JAK/MPL/CALR mutations negative.  I personally reviewed and went over laboratory results with the patient.  The results are noted within this dictation.  Labs will be updated today.  HCT is 51.9% today and therefore, he would benefit from a therapeutic phlebotomy, particularly given his cardiac history.  Chart reviewed.  Recent hospitalization noted from 4/12- 08/02/15 secondary to acute hypoxia respiratory failure, acute on chronic diastolic CHF, and AKI.  He is down to smoking 5-6 cigarettes per week.  He is continuing to work on complete cessation.  He is currently on Nicoderm managed by PCP.  He is trying to quit smoking with his wife.  He denies any complaints today. He feels good.  He tolerates phlebotomies.   Past Medical History  Diagnosis Date  . Essential hypertension   . COPD (chronic obstructive pulmonary disease) ([place])   . Polycythemia   . Tobacco abuse 08/02/2015    has COPD (chronic obstructive pulmonary disease) ([place]); Hypoxia; Lower extremity edema; Elevated troponin; Dyspnea; Acute respiratory failure with hypoxia ([place]); Acute on chronic renal insufficiency ([place]); Acute diastolic CHF (congestive heart failure) ([place]); Tobacco abuse; Abnormal nuclear stress test; Chronic diastolic heart failure ([place]); Essential hypertension; Polycythemia, secondary; COPD exacerbation ([place]); and Exertional dyspnea on his problem list.     is allergic to amoxicillin and penicillins.  Current Outpatient Prescriptions on File Prior to Visit  Medication Sig Dispense Refill  . ADVAIR DISKUS 250-50 MCG/DOSE AEPB USE 1 INHALATION BY MOUTH EVERY 12  HOURS. <<RINSE MOUTH AFTER USE>>. 60 each 3  . aspirin EC 81 MG tablet Take 81 mg by mouth daily.    Marland Kitchen ipratropium-albuterol (DUONEB) 0.5-2.5 (3) MG/3ML SOLN Take 3 mLs by nebulization every 6 (six) hours. (Patient taking differently: Take 3 mLs by nebulization 2 (two) times daily as needed (for wheezing or shortness of breath). ) 360 mL 1  . lisinopril (PRINIVIL,ZESTRIL) 20 MG tablet Take 0.5 tablets (10 mg total) by mouth daily. 30 tablet 1  . MAGNESIUM PO Take 1 tablet by mouth daily.    . nicotine (NICODERM CQ) 21 mg/24hr patch Place 1 patch (21 mg total) onto the skin daily. 28 patch 0  . potassium chloride (K-DUR,KLOR-CON) 10 MEQ tablet Take 10 mEq by mouth daily.    Marland Kitchen torsemide (DEMADEX) 20 MG tablet Take 3 tablets (60 mg total) by mouth daily. 270 tablet 3  . vitamin B-12 (CYANOCOBALAMIN) 1000 MCG tablet Take 1,000 mcg by mouth daily.    . metolazone (ZAROXOLYN) 2.5 MG tablet Take 2 tablets (5 mg total) by mouth daily. (Patient not taking: Reported on 08/24/2015) 1 tablet 0   No current facility-administered medications on file prior to visit.    Past Surgical History  Procedure Laterality Date  . Cardiac catheterization N/A 08/10/2015    Procedure: Left Heart Cath and Coronary Angiography;  Surgeon: Troy Sine, MD;  Location: [facility];  Service: Cardiovascular;  Laterality: N/A;    Denies any headaches, dizziness, double vision, fevers, chills, night sweats, nausea, vomiting, diarrhea, constipation, chest pain, heart palpitations, shortness of breath, blood in stool, black tarry stool, urinary pain, urinary burning, urinary frequency, hematuria.  PHYSICAL EXAMINATION  ECOG PERFORMANCE STATUS: 0 - Asymptomatic  Filed Vitals:   08/24/15 0947  BP: 122/78  Pulse: 74  Temp: 98.1 F (36.7 C)  Resp: 20    GENERAL:alert, no distress, well nourished, well developed, comfortable, cooperative, obese, smiling and erythematous face, chronically ill appearing,  unaccompanied. SKIN: skin color, texture, turgor are normal, no rashes or significant lesions HEAD: Normocephalic, No masses, lesions, tenderness or abnormalities EYES: normal, EOMI, Conjunctiva are pink and non-injected EARS: External ears normal OROPHARYNX:lips, buccal mucosa, and tongue normal and mucous membranes are moist  NECK: supple, no adenopathy, thyroid normal size, non-tender, without nodularity, trachea midline LYMPH:  no palpable lymphadenopathy BREAST:not examined LUNGS: clear to auscultation , decreased breath sounds bilaterally. HEART: regular rate & rhythm, no murmurs, no gallops, S1 normal and S2 normal ABDOMEN:abdomen soft, non-tender, obese and normal bowel sounds BACK: Back symmetric, no curvature., No CVA tenderness EXTREMITIES:less then 2 second capillary refill, no joint deformities, effusion, or inflammation, no skin discoloration  NEURO: alert & oriented x 3 with fluent speech, no focal motor/sensory deficits, gait normal    LABORATORY DATA: CBC    Component Value Date/Time   WBC 8.9 08/24/2015 0907   RBC 6.57* 08/24/2015 0907   RBC 6.61* 08/03/2014 1611   HGB 14.5 08/24/2015 0907   HCT 51.9 08/24/2015 0907   PLT 186 08/24/2015 0907   MCV 79.0 08/24/2015 0907   MCH 22.1* 08/24/2015 0907   MCHC 27.9* 08/24/2015 0907   RDW 20.6* 08/24/2015 0907   LYMPHSABS 2.9 08/24/2015 0907   MONOABS 0.6 08/24/2015 0907   EOSABS 0.2 08/24/2015 0907   BASOSABS 0.0 08/24/2015 0907      Chemistry      Component Value Date/Time   NA 139 08/09/2015 1100   K 4.5 08/09/2015 1100   CL 94* 08/09/2015 1100   CO2 37* 08/09/2015 1100   BUN 26* 08/09/2015 1100   CREATININE 1.29* 08/09/2015 1100      Component Value Date/Time   CALCIUM 9.0 08/09/2015 1100   ALKPHOS 75 08/19/2014 1600   AST 21 08/19/2014 1600   ALT 28 08/19/2014 1600   BILITOT 0.5 08/19/2014 1600     Lab Results  Component Value Date   FERRITIN 7* 05/05/2015    PENDING LABS:   RADIOGRAPHIC  STUDIES:  Dg Chest 2 View  07/28/2015  CLINICAL DATA:  Shortness of breath, COPD, CHF EXAM: CHEST  2 VIEW COMPARISON:  05/26/2015 FINDINGS: Cardiomegaly with vascular congestion. No overt edema. No confluent opacities or effusions. No acute bony abnormality. IMPRESSION: Cardiomegaly, vascular congestion. Electronically Signed   By: Rolm Baptise M.D.   On: 07/28/2015 13:39   Nm Myocar Multi W/spect W/wall Motion / Ef  08/02/2015   No diagnostic ST segment changes to indicate ischemia.  Medium, moderate to severe intensity, inferior defect from apex to base that exhibits partial reversibility and is consistent with ischemia in the RCA distribution. There is associated diaphragmatic attenuation as well a contribute partially to this defect.  Nuclear stress EF: 60%.  This is an intermediate risk study.      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  JAK/MPL/CALR mutations negative.  He requires intermittent therapeutic phlebotomies to maintain a HCT of 50% or less.  Labs today: CBC diff, ferritin.  Labs in 3 months: CBC diff, ferritin  His HCT is 51.9% and therefore, we will get him set-up for a therapeutic phlebotomy.  Smoking cessation education provided today.  He is down to 5-6 cigarettes per day (1/4 ppd).  He is on Nicoderm that is prescribed by his primary care provider.  He is quitting with his wife.  Return in 3 months for follow-up.    Tobacco abuse He is cutting back on tobacco abuse and he is supported and encouraged to continue; with the end goal of complete cessation.  He report 5-6 cigarettes per week currently.  He continues to work on decreasing tobacco abuse; now with the assistance of Nicoderm managed by primary care provider.  I have discussed lung cancer screening with the patient, and at this time, he wants to consider this surveillance option.  He relays this story to me of a  friend with lung cancer and he was a rare smoker.  For this reason he wants to think about this surveillance option.  He is unable to give me a better answer to my question: "What about this story makes you want to wait on CT screening."   A lung cancer screening counseling and shared decision making visit. Age: 63 Pack year smoking history: 50 pack years Current smoker or < 15 years of cessation: Current smoker of tobacco/cigarettes. No current symptoms of lung cancer Risks and benefits of lung cancer screening discussed:  Negative- over-diagnosis, radiation exposure, false positives, and additional testing  Positive- discover early stage lung cancer resulting in higher incidence of cure Patient educated regarding the importance of adherence to continued lung cancer screening. Currently, there are no co-morbidities to prevent treatment to therapy for lung cancer and the patient is agreeable to pursue treatment if a malignancy is discovered.  [place] Preventative Services Task Force recommend annual screening for lung cancer with low-dose CT in adults aged 29- 56 years who have a 30 pack year smoking history and currently smoke or have quit smoking within the past 15 years.  Screening should be discontinued once a person has not smoked for 15 years or develops a health problem that substantially limits life expectancy or the ability or willingness to have curative lung surgery.  It is a category B recommendation.  Similar stances are provided by CMS, NCCN, and AATS.  Currently, there are no co-morbidities to prevent treatment to therapy for lung cancer and the patient is agreeable to pursue treatment if a malignancy is discovered.    THERAPY PLAN:  Will continue to monitor HCT and maintain a HCT 50% or less with therapeutic phlebotomy.  He is going to continue with smoking cessation.  All questions were answered. The patient knows to call the clinic with any problems, questions or concerns. We can  certainly see the patient much sooner if necessary.  Patient and plan discussed with Dr. Ancil Linsey and she is in agreement with the aforementioned.   This note is electronically signed by: Doy Mince 08/24/2015 10:29 AM

## 2015-08-23 NOTE — Assessment & Plan Note (Addendum)
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  JAK/MPL/CALR mutations negative.  He requires intermittent therapeutic phlebotomies to maintain a HCT of 50% or less.  Labs today: CBC diff, ferritin.  Labs in 3 months: CBC diff, ferritin  His HCT is 51.9% and therefore, we will get him set-up for a therapeutic phlebotomy.  Smoking cessation education provided today.  He is down to 5-6 cigarettes per day (1/4 ppd).  He is on Nicoderm that is prescribed by his primary care provider.  He is quitting with his wife.  Return in 3 months for follow-up.

## 2015-08-24 ENCOUNTER — Telehealth: Payer: Self-pay | Admitting: *Deleted

## 2015-08-24 ENCOUNTER — Other Ambulatory Visit (HOSPITAL_COMMUNITY)
Admission: RE | Admit: 2015-08-24 | Discharge: 2015-08-24 | Disposition: A | Discharge status: Home / Self Care | Payer: Medicare Other | Source: Ambulatory Visit | Attending: Adult Health | Admitting: Adult Health

## 2015-08-24 ENCOUNTER — Encounter (HOSPITAL_COMMUNITY): Payer: Medicare Other

## 2015-08-24 ENCOUNTER — Encounter (HOSPITAL_COMMUNITY): Payer: Medicare Other | Attending: Hematology & Oncology | Admitting: Oncology

## 2015-08-24 ENCOUNTER — Encounter (HOSPITAL_COMMUNITY): Payer: Self-pay | Admitting: Oncology

## 2015-08-24 VITALS — BP 122/78 | HR 74 | Temp 98.1°F | Resp 20 | Wt 245.0 lb

## 2015-08-24 DIAGNOSIS — D751 Secondary polycythemia: Secondary | ICD-10-CM

## 2015-08-24 DIAGNOSIS — Z5181 Encounter for therapeutic drug level monitoring: Secondary | ICD-10-CM | POA: Diagnosis not present

## 2015-08-24 DIAGNOSIS — Z720 Tobacco use: Secondary | ICD-10-CM

## 2015-08-24 DIAGNOSIS — I509 Heart failure, unspecified: Secondary | ICD-10-CM | POA: Insufficient documentation

## 2015-08-24 LAB — CBC WITH DIFFERENTIAL/PLATELET
BASOS ABS: 0 10*3/uL (ref 0.0–0.1)
BASOS PCT: 0 %
EOS PCT: 2 %
Eosinophils Absolute: 0.2 10*3/uL (ref 0.0–0.7)
HCT: 51.9 % (ref 39.0–52.0)
Hemoglobin: 14.5 g/dL (ref 13.0–17.0)
LYMPHS PCT: 33 %
Lymphs Abs: 2.9 10*3/uL (ref 0.7–4.0)
MCH: 22.1 pg — ABNORMAL LOW (ref 26.0–34.0)
MCHC: 27.9 g/dL — AB (ref 30.0–36.0)
MCV: 79 fL (ref 78.0–100.0)
MONO ABS: 0.6 10*3/uL (ref 0.1–1.0)
MONOS PCT: 7 %
Neutro Abs: 5.2 10*3/uL (ref 1.7–7.7)
Neutrophils Relative %: 58 %
PLATELETS: 186 10*3/uL (ref 150–400)
RBC: 6.57 MIL/uL — ABNORMAL HIGH (ref 4.22–5.81)
RDW: 20.6 % — AB (ref 11.5–15.5)
WBC: 8.9 10*3/uL (ref 4.0–10.5)

## 2015-08-24 LAB — FERRITIN: Ferritin: 9 ng/mL — ABNORMAL LOW (ref 24–336)

## 2015-08-24 LAB — BASIC METABOLIC PANEL
Anion gap: 10 (ref 5–15)
BUN: 26 mg/dL — AB (ref 6–20)
CALCIUM: 9.1 mg/dL (ref 8.9–10.3)
CHLORIDE: 92 mmol/L — AB (ref 101–111)
CO2: 39 mmol/L — AB (ref 22–32)
CREATININE: 1.3 mg/dL — AB (ref 0.61–1.24)
GFR calc non Af Amer: 56 mL/min — ABNORMAL LOW (ref >60)
Glucose, Bld: 99 mg/dL (ref 65–99)
Potassium: 4.3 mmol/L (ref 3.5–5.1)
Sodium: 141 mmol/L (ref 135–145)

## 2015-08-24 NOTE — Telephone Encounter (Signed)
Called patient with test results. No answer. Left message to call back.  

## 2015-08-24 NOTE — Telephone Encounter (Signed)
-----   Message from Lendon Colonel, NP sent at 08/24/2015  4:27 PM EDT ----- Lab essentially unchanged. Continue current medications. He is to avoid salty foods, not just adding salt to his food. Continue daily wts.

## 2015-08-24 NOTE — Patient Instructions (Addendum)
[place] at [facility] Discharge Instructions  RECOMMENDATIONS MADE BY THE CONSULTANT AND ANY TEST RESULTS WILL BE SENT TO YOUR REFERRING PHYSICIAN.  Exam and discussion today with Kirby Crigler PA-C. Therapeutic phlebotomy as scheduled. Call this clinic if you decide to have a CT scan prior to next visit. Return in 3 months for lab work and office visit.   Thank you for choosing [place] at [facility] to provide your oncology and hematology care.  To afford each patient quality time with our provider, please arrive at least 15 minutes before your scheduled appointment time.   Beginning January 23rd 2017 lab work for the [facility] will be done in the  Main lab at [facility] on 1st floor. If you have a lab appointment with the [facility] please come in thru the  Main Entrance and check in at the main information desk  You need to re-schedule your appointment should you arrive 10 or more minutes late.  We strive to give you quality time with our providers, and arriving late affects you and other patients whose appointments are after yours.  Also, if you no show three or more times for appointments you may be dismissed from the clinic at the providers discretion.     Again, thank you for choosing [facility].  Our hope is that these requests will decrease the amount of time that you wait before being seen by our physicians.       _____________________________________________________________  Should you have questions after your visit to [facility], please contact our office at [phone] between the hours of 8:30 a.m. and 4:30 p.m.  Voicemails left after 4:30 p.m. will not be returned until the following business day.  For prescription refill requests, have your pharmacy contact our office.         Resources For Cancer Patients and their Caregivers ? American Cancer Society: Can assist with  transportation, wigs, general needs, runs Look Good Feel Better.        581-798-2499 ? Cancer Care: Provides financial assistance, online support groups, medication/co-pay assistance.  1-800-813-HOPE 779-436-6243) ? [facility] Assists [place] cancer patients and their families through emotional , educational and financial support.  (747) 421-7558 ? [place] DSS Where to apply for food stamps, Medicaid and utility assistance. 262-024-5018 ? RCATS: Transportation to medical appointments. 201-113-3797 ? Social Security Administration: May apply for disability if have a Stage IV cancer. 365-133-1474 4245969808 ? [employer], Disability and Transit Services: Assists with nutrition, care and transit needs. [place] Support Programs: @10RELATIVEDAYS @ > Cancer Support Group  2nd Tuesday of the month 1pm-2pm, Journey Room  > Creative Journey  3rd Tuesday of the month 1130am-1pm, Journey Room  > Look Good Feel Better  1st Wednesday of the month 10am-12 noon, Journey Room (Call [place] to register [phone])

## 2015-08-31 ENCOUNTER — Encounter (HOSPITAL_COMMUNITY)
Admission: RE | Admit: 2015-08-31 | Discharge: 2015-08-31 | Disposition: A | Discharge status: Home / Self Care | Payer: Medicare Other | Source: Ambulatory Visit | Attending: Hematology & Oncology | Admitting: Hematology & Oncology

## 2015-08-31 ENCOUNTER — Other Ambulatory Visit (HOSPITAL_COMMUNITY): Payer: Self-pay | Admitting: *Deleted

## 2015-08-31 DIAGNOSIS — J441 Chronic obstructive pulmonary disease with (acute) exacerbation: Secondary | ICD-10-CM | POA: Insufficient documentation

## 2015-08-31 NOTE — Progress Notes (Signed)
Brian Parsons presents today for phlebotomy per MD orders. HGB/HCT:14.5/51.9 Phlebotomy procedure started at 0920 and ended at 0927. 540 cc removed. 18 oz. Patient tolerated procedure well. IV needle removed intact from R AC.

## 2015-09-03 ENCOUNTER — Encounter: Payer: Self-pay | Admitting: Adult Health

## 2015-09-03 ENCOUNTER — Ambulatory Visit (INDEPENDENT_AMBULATORY_CARE_PROVIDER_SITE_OTHER): Payer: Medicare Other | Admitting: Adult Health

## 2015-09-03 VITALS — BP 104/62 | HR 95 | Ht 67.5 in | Wt 245.0 lb

## 2015-09-03 DIAGNOSIS — I5032 Chronic diastolic (congestive) heart failure: Secondary | ICD-10-CM | POA: Diagnosis not present

## 2015-09-03 DIAGNOSIS — Z720 Tobacco use: Secondary | ICD-10-CM

## 2015-09-03 DIAGNOSIS — I2510 Atherosclerotic heart disease of native coronary artery without angina pectoris: Secondary | ICD-10-CM

## 2015-09-03 NOTE — Patient Instructions (Signed)
Medication Instructions:  Your physician recommends that you continue on your current medications as directed. Please refer to the Current Medication list given to you today.  Labwork: none  Testing/Procedures: none  Follow-Up: Your physician wants you to follow-up in: 4 months.  You will receive a reminder letter in the mail two months in advance. If you don't receive a letter, please call our office to schedule the follow-up appointment.   Any Other Special Instructions Will Be Listed Below (If Applicable).     If you need a refill on your cardiac medications before your next appointment, please call your pharmacy.   

## 2015-09-03 NOTE — Progress Notes (Signed)
Name: Brian Parsons    DOB: 1950-07-25  Age: 65 y.o.  MR#: [id number]       PCP:  [name], PA-C      Insurance: Payor: MEDICARE / Plan: MEDICARE PART A AND B / Product Type: *No Product type* /   CC:   No chief complaint on file.   VS Filed Vitals:   09/03/15 1411  BP: 104/62  Pulse: 95  Height: 5' 7.5" (1.715 m)  Weight: 245 lb (111.131 kg)  SpO2: 85%    Weights Current Weight  09/03/15 245 lb (111.131 kg)  08/24/15 245 lb (111.131 kg)  08/17/15 244 lb (110.678 kg)    Blood Pressure  BP Readings from Last 3 Encounters:  09/03/15 104/62  08/31/15 99/66  08/24/15 122/78     Admit date:  (Not on file) Last encounter with RMR:  08/17/2015   Allergy Amoxicillin and Penicillins  Current Outpatient Prescriptions  Medication Sig Dispense Refill  . ADVAIR DISKUS 250-50 MCG/DOSE AEPB USE 1 INHALATION BY MOUTH EVERY 12 HOURS. <<RINSE MOUTH AFTER USE>>. 60 each 3  . aspirin EC 81 MG tablet Take 81 mg by mouth daily.    Marland Kitchen ipratropium-albuterol (DUONEB) 0.5-2.5 (3) MG/3ML SOLN Take 3 mLs by nebulization every 6 (six) hours. (Patient taking differently: Take 3 mLs by nebulization 2 (two) times daily as needed (for wheezing or shortness of breath). ) 360 mL 1  . lisinopril (PRINIVIL,ZESTRIL) 20 MG tablet Take 0.5 tablets (10 mg total) by mouth daily. 30 tablet 1  . MAGNESIUM PO Take 1 tablet by mouth daily.    . nicotine (NICODERM CQ) 21 mg/24hr patch Place 1 patch (21 mg total) onto the skin daily. 28 patch 0  . potassium chloride (K-DUR,KLOR-CON) 10 MEQ tablet Take 10 mEq by mouth daily.    Marland Kitchen torsemide (DEMADEX) 20 MG tablet Take 3 tablets (60 mg total) by mouth daily. 270 tablet 3  . vitamin B-12 (CYANOCOBALAMIN) 1000 MCG tablet Take 1,000 mcg by mouth daily.     No current facility-administered medications for this visit.    Discontinued Meds:    Medications Discontinued During This Encounter  Medication Reason  . metolazone (ZAROXOLYN) 2.5 MG tablet Error    Patient  Active Problem List   Diagnosis Date Noted  . Exertional dyspnea   . Abnormal nuclear stress test 08/04/2015  . Chronic diastolic heart failure ([place]) 08/04/2015  . Essential hypertension 08/04/2015  . Polycythemia, secondary 08/04/2015  . COPD exacerbation ([place]) 08/04/2015  . Tobacco abuse 08/02/2015  . Acute on chronic renal insufficiency ([place]) 07/28/2015  . Acute diastolic CHF (congestive heart failure) ([place]) 07/28/2015  . Acute respiratory failure with hypoxia ([place]) 08/04/2014  . COPD (chronic obstructive pulmonary disease) ([place]) 08/03/2014  . Hypoxia 08/03/2014  . Lower extremity edema 08/03/2014  . Elevated troponin 08/03/2014  . Dyspnea 08/03/2014    LABS    Component Value Date/Time   NA 141 08/24/2015 0912   NA 139 08/09/2015 1100   NA 138 08/04/2015 1255   K 4.3 08/24/2015 0912   K 4.5 08/09/2015 1100   K 4.6 08/04/2015 1255   CL 92* 08/24/2015 0912   CL 94* 08/09/2015 1100   CL 93* 08/04/2015 1255   CO2 39* 08/24/2015 0912   CO2 37* 08/09/2015 1100   CO2 35* 08/04/2015 1255   GLUCOSE 99 08/24/2015 0912   GLUCOSE 138* 08/09/2015 1100   GLUCOSE 147* 08/04/2015 1255   BUN 26* 08/24/2015 0912   BUN 26* 08/09/2015  1100   BUN 40* 08/04/2015 1255   CREATININE 1.30* 08/24/2015 0912   CREATININE 1.29* 08/09/2015 1100   CREATININE 1.53* 08/04/2015 1255   CALCIUM 9.1 08/24/2015 0912   CALCIUM 9.0 08/09/2015 1100   CALCIUM 9.4 08/04/2015 1255   GFRNONAA 56* 08/24/2015 0912   GFRNONAA 56* 08/09/2015 1100   GFRNONAA 46* 08/04/2015 1255   GFRAA >60 08/24/2015 0912   GFRAA >60 08/09/2015 1100   GFRAA 53* 08/04/2015 1255   CMP     Component Value Date/Time   NA 141 08/24/2015 0912   K 4.3 08/24/2015 0912   CL 92* 08/24/2015 0912   CO2 39* 08/24/2015 0912   GLUCOSE 99 08/24/2015 0912   BUN 26* 08/24/2015 0912   CREATININE 1.30* 08/24/2015 0912   CALCIUM 9.1 08/24/2015 0912   PROT 6.4* 08/19/2014 1600   ALBUMIN 3.7 08/19/2014 1600   AST 21 08/19/2014 1600    ALT 28 08/19/2014 1600   ALKPHOS 75 08/19/2014 1600   BILITOT 0.5 08/19/2014 1600   GFRNONAA 56* 08/24/2015 0912   GFRAA >60 08/24/2015 0912       Component Value Date/Time   WBC 8.9 08/24/2015 0907   WBC 9.2 08/04/2015 1255   WBC 9.3 07/28/2015 1944   HGB 14.5 08/24/2015 0907   HGB 13.6 08/04/2015 1255   HGB 13.7 07/28/2015 1944   HCT 51.9 08/24/2015 0907   HCT 50.4 08/04/2015 1255   HCT 49.6 07/28/2015 1944   MCV 79.0 08/24/2015 0907   MCV 76.7* 08/04/2015 1255   MCV 76.0* 07/28/2015 1944    Lipid Panel     Component Value Date/Time   CHOL 132 07/30/2015 0441   TRIG 144 07/30/2015 0441   HDL 27* 07/30/2015 0441   CHOLHDL 4.9 07/30/2015 0441   VLDL 29 07/30/2015 0441   LDLCALC 76 07/30/2015 0441    ABG    Component Value Date/Time   PHART 7.352 08/03/2014 1620   PCO2ART 68.2* 08/03/2014 1620   PO2ART 52.3* 08/03/2014 1620   HCO3 36.9* 08/03/2014 1620   TCO2 30.3 08/03/2014 1620   O2SAT 86.0 08/03/2014 1620     Lab Results  Component Value Date   TSH 1.098 08/03/2014   BNP (last 3 results)  Recent Labs  07/28/15 1308  BNP 73.0    ProBNP (last 3 results) No results for input(s): PROBNP in the last 8760 hours.  Cardiac Panel (last 3 results) No results for input(s): CKTOTAL, CKMB, TROPONINI, RELINDX in the last 72 hours.  Iron/TIBC/Ferritin/ %Sat    Component Value Date/Time   FERRITIN 9* 08/24/2015 0907     EKG Orders placed or performed during the hospital encounter of 07/28/15  . ED EKG within 10 minutes  . EKG 12-Lead  . EKG 12-Lead  . EKG 12-Lead  . EKG  . EKG 12-Lead  . EKG 12-Lead  . EKG 12-Lead  . EKG 12-Lead  . EKG 12-Lead  . EKG 12-Lead  . EKG 12-Lead  . EKG 12-Lead     Prior Assessment and Plan Problem List as of 09/03/2015      Cardiovascular and Mediastinum   Acute diastolic CHF (congestive heart failure) (HCC)   Chronic diastolic heart failure (HCC)   Essential hypertension     Respiratory   COPD (chronic  obstructive pulmonary disease) (HCC)   Hypoxia   Acute respiratory failure with hypoxia (HCC)   COPD exacerbation ([place])     Genitourinary   Acute on chronic renal insufficiency ([place])     Other  Lower extremity edema   Elevated troponin   Dyspnea   Tobacco abuse   Last Assessment & Plan 08/24/2015 Office Visit Edited 08/24/2015 10:24 AM by [facility], PA-C    He is cutting back on tobacco abuse and he is supported and encouraged to continue; with the end goal of complete cessation.  He report 5-6 cigarettes per week currently.  He continues to work on decreasing tobacco abuse; now with the assistance of Nicoderm managed by primary care provider.  I have discussed lung cancer screening with the patient, and at this time, he wants to consider this surveillance option.  He relays this story to me of a friend with lung cancer and he was a rare smoker.  For this reason he wants to think about this surveillance option.  He is unable to give me a better answer to my question: "What about this story makes you want to wait on CT screening."   A lung cancer screening counseling and shared decision making visit. Age: 29 Pack year smoking history: 50 pack years Current smoker or < 15 years of cessation: Current smoker of tobacco/cigarettes. No current symptoms of lung cancer Risks and benefits of lung cancer screening discussed:  Negative- over-diagnosis, radiation exposure, false positives, and additional testing  Positive- discover early stage lung cancer resulting in higher incidence of cure Patient educated regarding the importance of adherence to continued lung cancer screening. Currently, there are no co-morbidities to prevent treatment to therapy for lung cancer and the patient is agreeable to pursue treatment if a malignancy is discovered.  [place] Preventative Services Task Force recommend annual screening for lung cancer with low-dose CT in adults aged 19- 47 years who have a 30 pack year  smoking history and currently smoke or have quit smoking within the past 15 years.  Screening should be discontinued once a person has not smoked for 15 years or develops a health problem that substantially limits life expectancy or the ability or willingness to have curative lung surgery.  It is a category B recommendation.  Similar stances are provided by CMS, NCCN, and AATS.  Currently, there are no co-morbidities to prevent treatment to therapy for lung cancer and the patient is agreeable to pursue treatment if a malignancy is discovered.      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  JAK/MPL/CALR mutations negative.  He requires intermittent therapeutic phlebotomies to maintain a HCT of 50% or less.  Labs today: CBC diff, ferritin.  Labs in 3 months: CBC diff, ferritin  His HCT is 51.9% and therefore, we will get him set-up for a therapeutic phlebotomy.  Smoking cessation education provided today.  He is down to 5-6 cigarettes per day (1/4 ppd).  He is on Nicoderm that is prescribed by his primary care provider.  He is quitting with his wife.  Return in 3 months for follow-up.        Exertional dyspnea       Imaging: No results found.

## 2015-09-03 NOTE — Progress Notes (Signed)
Cardiology Office Note   Date:  09/03/2015   ID:  Brian Parsons, DOB 1950-08-31, MRN [id number]  PCP:  [name], PA-C  Cardiologist: [place]/  Jory Sims, NP   No chief complaint on file.     History of Present Illness: Brian Parsons is a 65 y.o. male who presents for ongoing assessment and management of chronic diastolic heart failure, EF of 66 5%, abnormal EKG he was recently LexiScan Myoview revealing RCA distribution ischemia with an intermediate risk noted. Patient had a cardiac catheterization on 08/10/2015 which revealed mild CAD normal LAD, 30% ostial narrowing of the OM 1 vessel, and left circumflex artery with 40% distal tubular circumflex stenosis, and a large dominant RCA with mid 20% and mid distal stenosis. The patient had preserved LV function of 55%.  The last seen in the office the patient gained 6 pounds and began to retain fluid having abdominal distention lower extremity edema.he was told to increase his torsemide 60 mg. He continued to have evidence of fluid overload and therefore was given one dose of metolazone 2.5 mg x1 with increased dose of potassium temporarily. He was told to adhere to a low sodium diet. He is advised on smoking cessation.he is being followed by oncology to evaluate for lung cancer, and was seen on 08/23/2015.he is also getting phlebotomies.  He is here today without any complaints. He has cut down to 10 cigarettes a week and continues to work on stopping altogether. He denies any worsening shortness of breath he denies any lower extremity edema he is becoming more active he denies any dizziness he denies any palpitations or discomfort.  Past Medical History  Diagnosis Date  . Essential hypertension   . COPD (chronic obstructive pulmonary disease) ([facility])   . Polycythemia   . Tobacco abuse 08/02/2015    Past Surgical History  Procedure Laterality Date  . Cardiac catheterization N/A 08/10/2015    Procedure: Left Heart Cath and  Coronary Angiography;  Surgeon: Troy Sine, MD;  Location: [facility];  Service: Cardiovascular;  Laterality: N/A;     Current Outpatient Prescriptions  Medication Sig Dispense Refill  . ADVAIR DISKUS 250-50 MCG/DOSE AEPB USE 1 INHALATION BY MOUTH EVERY 12 HOURS. <<RINSE MOUTH AFTER USE>>. 60 each 3  . aspirin EC 81 MG tablet Take 81 mg by mouth daily.    Marland Kitchen ipratropium-albuterol (DUONEB) 0.5-2.5 (3) MG/3ML SOLN Take 3 mLs by nebulization every 6 (six) hours. (Patient taking differently: Take 3 mLs by nebulization 2 (two) times daily as needed (for wheezing or shortness of breath). ) 360 mL 1  . lisinopril (PRINIVIL,ZESTRIL) 20 MG tablet Take 0.5 tablets (10 mg total) by mouth daily. 30 tablet 1  . MAGNESIUM PO Take 1 tablet by mouth daily.    . nicotine (NICODERM CQ) 21 mg/24hr patch Place 1 patch (21 mg total) onto the skin daily. 28 patch 0  . potassium chloride (K-DUR,KLOR-CON) 10 MEQ tablet Take 10 mEq by mouth daily.    Marland Kitchen torsemide (DEMADEX) 20 MG tablet Take 3 tablets (60 mg total) by mouth daily. 270 tablet 3  . vitamin B-12 (CYANOCOBALAMIN) 1000 MCG tablet Take 1,000 mcg by mouth daily.     No current facility-administered medications for this visit.    Allergies:   Amoxicillin and Penicillins    Social History:  The patient  reports that he has been smoking Cigarettes.  He has a 50 pack-year smoking history. He has never used smokeless tobacco. He reports that he  does not drink alcohol or use illicit drugs.   Family History:  The patient's family history includes Emphysema in his father; Stroke in his mother.    ROS: All other systems are reviewed and negative. Unless otherwise mentioned in H&P    PHYSICAL EXAM: VS:  BP 104/62 mmHg  Pulse 95  Ht 5' 7.5" (1.715 m)  Wt 245 lb (111.131 kg)  BMI 37.78 kg/m2  SpO2 85% , BMI Body mass index is 37.78 kg/(m^2). GEN: Well nourished, well developed, in no acute distress HEENT: normal Neck: no JVD, carotid bruits, or  masses Cardiac: RRR; no murmurs, rubs, or gallops,no edema  Respiratory:  clear to auscultation bilaterally, normal work of breathing GI: soft, nontender, nondistended, + BS MS: no deformity or atrophy Skin: warm and dry, no rash Neuro:  Strength and sensation are intact Psych: euthymic mood, full affect  Recent Labs: 07/28/2015: B Natriuretic Peptide 73.0 08/24/2015: BUN 26*; Creatinine, Ser 1.30*; Hemoglobin 14.5; Platelets 186; Potassium 4.3; Sodium 141    Lipid Panel    Component Value Date/Time   CHOL 132 07/30/2015 0441   TRIG 144 07/30/2015 0441   HDL 27* 07/30/2015 0441   CHOLHDL 4.9 07/30/2015 0441   VLDL 29 07/30/2015 0441   LDLCALC 76 07/30/2015 0441      Wt Readings from Last 3 Encounters:  09/03/15 245 lb (111.131 kg)  08/24/15 245 lb (111.131 kg)  08/17/15 244 lb (110.678 kg)    ASSESSMENT AND PLAN:  1. Chronic diastolic heart failure: doing well, and no evidence of fluid retention, weight is decreasing. He's become more active. Continue him on current medication regimen without changes.  2. Coronary artery disease:nonobstructive. Preserved LV systolic function. Patient will continue ACE inhibitor. He is done on a beta blocker for history of COPD. Continue aspirin 81 mg daily.  3. Tobacco abuse:he has cut back significantly from 2 packs a day to 10 cigarettes a week. I have encouraged him and his efforts to stop altogether.   Current medicines are reviewed at length with the patient today.    Labs/ tests ordered today include:  No orders of the defined types were placed in this encounter.     Disposition:   FU with 4 months Signed, Jory Sims, NP  09/03/2015 2:19 PM    [facility] [address] [place], [place] [address] Phone: [phone]; Fax: [phone]

## 2015-09-14 ENCOUNTER — Other Ambulatory Visit: Payer: Self-pay

## 2015-09-14 MED ORDER — TORSEMIDE 20 MG PO TABS: 60.0000 mg | ORAL_TABLET | Freq: Every day | ORAL | Status: DC

## 2015-09-30 ENCOUNTER — Telehealth: Payer: Self-pay | Admitting: Cardiology

## 2015-09-30 NOTE — Telephone Encounter (Signed)
Patient said both his legs and feet are swollen more than they were at his last office visit. Patient said the swelling is now up to his knees and it just felt uncomfortable. No c/o pain, redness,sob,weight gain, dizziness or chest pain. Patient said he weighs around 240 lbs everyday. Patient is currently taking 3 torsemide 20 mg daily since the end of April. Medications reviewed while on phone with patient. Patient informed that MD would be informed upon his return to the office tomorrow. Patient verbalized understanding.

## 2015-09-30 NOTE — Telephone Encounter (Signed)
Brian Parsons called stating that he continues to have swelling in his feet and legs.

## 2015-09-30 NOTE — Telephone Encounter (Signed)
Let's have him go back on metolazone 2.5 mg daily through the weekend with his demadex to see if edema will improve.

## 2015-10-01 NOTE — Telephone Encounter (Signed)
Patient informed and verbalized understanding of plan. 

## 2015-10-21 ENCOUNTER — Telehealth: Payer: Self-pay | Admitting: Cardiology

## 2015-10-21 DIAGNOSIS — Z79899 Other long term (current) drug therapy: Secondary | ICD-10-CM

## 2015-10-21 MED ORDER — TORSEMIDE 20 MG PO TABS: 80.0000 mg | ORAL_TABLET | Freq: Every day | ORAL | Status: DC

## 2015-10-21 NOTE — Telephone Encounter (Signed)
Have him increase Demadex to 80 mg daily and arrange a follow-up visit with me in the next few weeks with BMET to review.

## 2015-10-21 NOTE — Telephone Encounter (Signed)
Informed pt's wife of medication change and for pt to have lab work done on Wednesday (7/12) pt scheduled to see Jory Sims 7/13 as Dr. Myles Gip schedule is full. Pt wife voiced understanding. She will call back if she has any more questions.

## 2015-10-21 NOTE — Telephone Encounter (Signed)
Pt called stating he's still having fluid in his feet/legs

## 2015-10-21 NOTE — Telephone Encounter (Signed)
Pt stated he is having more swelling in his legs and feet. His weight has not gone up. He is taking his torsemide daily and has taken metolazone for the past 4 days. He has been propping his feet up and nothing seems to be helping. Please advise.

## 2015-10-28 ENCOUNTER — Encounter: Payer: Self-pay | Admitting: Adult Health

## 2015-10-28 ENCOUNTER — Ambulatory Visit (INDEPENDENT_AMBULATORY_CARE_PROVIDER_SITE_OTHER): Payer: Medicare Other | Admitting: Adult Health

## 2015-10-28 VITALS — BP 130/82 | HR 76 | Ht 67.5 in | Wt 248.0 lb

## 2015-10-28 DIAGNOSIS — I5032 Chronic diastolic (congestive) heart failure: Secondary | ICD-10-CM

## 2015-10-28 DIAGNOSIS — Z720 Tobacco use: Secondary | ICD-10-CM

## 2015-10-28 DIAGNOSIS — I2510 Atherosclerotic heart disease of native coronary artery without angina pectoris: Secondary | ICD-10-CM

## 2015-10-28 DIAGNOSIS — R600 Localized edema: Secondary | ICD-10-CM | POA: Diagnosis not present

## 2015-10-28 LAB — BASIC METABOLIC PANEL
BUN: 28 mg/dL — AB (ref 7–25)
CO2: 36 mmol/L — ABNORMAL HIGH (ref 20–31)
CREATININE: 1.43 mg/dL — AB (ref 0.70–1.25)
Calcium: 8.9 mg/dL (ref 8.6–10.3)
Chloride: 86 mmol/L — ABNORMAL LOW (ref 98–110)
GLUCOSE: 63 mg/dL — AB (ref 65–99)
POTASSIUM: 4.2 mmol/L (ref 3.5–5.3)
Sodium: 135 mmol/L (ref 135–146)

## 2015-10-28 NOTE — Progress Notes (Signed)
Name: Brian Parsons    DOB: 15-Apr-1951  Age: 65 y.o.  MR#: [id number]       PCP:  [name], PA-C      Insurance: Payor: MEDICARE / Plan: MEDICARE PART A AND B / Product Type: *No Product type* /   CC:   No chief complaint on file.   VS Filed Vitals:   10/28/15 1504  Height: 5' 7.5" (1.715 m)  Weight: 248 lb (112.492 kg)    Weights Current Weight  10/28/15 248 lb (112.492 kg)  09/03/15 245 lb (111.131 kg)  08/24/15 245 lb (111.131 kg)    Blood Pressure  BP Readings from Last 3 Encounters:  09/03/15 104/62  08/31/15 99/66  08/24/15 122/78     Admit date:  (Not on file) Last encounter with RMR:  09/03/2015   Allergy Amoxicillin and Penicillins  Current Outpatient Prescriptions  Medication Sig Dispense Refill  . ADVAIR DISKUS 250-50 MCG/DOSE AEPB USE 1 INHALATION BY MOUTH EVERY 12 HOURS. <<RINSE MOUTH AFTER USE>>. 60 each 3  . aspirin EC 81 MG tablet Take 81 mg by mouth daily.    Marland Kitchen ipratropium-albuterol (DUONEB) 0.5-2.5 (3) MG/3ML SOLN Take 3 mLs by nebulization every 6 (six) hours. (Patient taking differently: Take 3 mLs by nebulization 2 (two) times daily as needed (for wheezing or shortness of breath). ) 360 mL 1  . lisinopril (PRINIVIL,ZESTRIL) 20 MG tablet Take 0.5 tablets (10 mg total) by mouth daily. 30 tablet 1  . MAGNESIUM PO Take 1 tablet by mouth daily.    . nicotine (NICODERM CQ) 21 mg/24hr patch Place 1 patch (21 mg total) onto the skin daily. 28 patch 0  . potassium chloride (K-DUR,KLOR-CON) 10 MEQ tablet Take 10 mEq by mouth daily.    Marland Kitchen torsemide (DEMADEX) 20 MG tablet Take 4 tablets (80 mg total) by mouth daily. 130 tablet 1  . vitamin B-12 (CYANOCOBALAMIN) 1000 MCG tablet Take 1,000 mcg by mouth daily.     No current facility-administered medications for this visit.    Discontinued Meds:   There are no discontinued medications.  Patient Active Problem List   Diagnosis Date Noted  . Exertional dyspnea   . Abnormal nuclear stress test 08/04/2015  .  Chronic diastolic heart failure ([facility]) 08/04/2015  . Essential hypertension 08/04/2015  . Polycythemia, secondary 08/04/2015  . COPD exacerbation ([facility]) 08/04/2015  . Tobacco abuse 08/02/2015  . Acute on chronic renal insufficiency ([facility]) 07/28/2015  . Acute diastolic CHF (congestive heart failure) ([facility]) 07/28/2015  . Acute respiratory failure with hypoxia ([facility]) 08/04/2014  . COPD (chronic obstructive pulmonary disease) ([facility]) 08/03/2014  . Hypoxia 08/03/2014  . Lower extremity edema 08/03/2014  . Elevated troponin 08/03/2014  . Dyspnea 08/03/2014    LABS    Component Value Date/Time   NA 135 10/27/2015 0919   NA 141 08/24/2015 0912   NA 139 08/09/2015 1100   K 4.2 10/27/2015 0919   K 4.3 08/24/2015 0912   K 4.5 08/09/2015 1100   CL 86* 10/27/2015 0919   CL 92* 08/24/2015 0912   CL 94* 08/09/2015 1100   CO2 36* 10/27/2015 0919   CO2 39* 08/24/2015 0912   CO2 37* 08/09/2015 1100   GLUCOSE 63* 10/27/2015 0919   GLUCOSE 99 08/24/2015 0912   GLUCOSE 138* 08/09/2015 1100   BUN 28* 10/27/2015 0919   BUN 26* 08/24/2015 0912   BUN 26* 08/09/2015 1100   CREATININE 1.43* 10/27/2015 0919   CREATININE 1.30* 08/24/2015 0912   CREATININE 1.29*  08/09/2015 1100   CREATININE 1.53* 08/04/2015 1255   CALCIUM 8.9 10/27/2015 0919   CALCIUM 9.1 08/24/2015 0912   CALCIUM 9.0 08/09/2015 1100   GFRNONAA 56* 08/24/2015 0912   GFRNONAA 56* 08/09/2015 1100   GFRNONAA 46* 08/04/2015 1255   GFRAA >60 08/24/2015 0912   GFRAA >60 08/09/2015 1100   GFRAA 53* 08/04/2015 1255   CMP     Component Value Date/Time   NA 135 10/27/2015 0919   K 4.2 10/27/2015 0919   CL 86* 10/27/2015 0919   CO2 36* 10/27/2015 0919   GLUCOSE 63* 10/27/2015 0919   BUN 28* 10/27/2015 0919   CREATININE 1.43* 10/27/2015 0919   CREATININE 1.30* 08/24/2015 0912   CALCIUM 8.9 10/27/2015 0919   PROT 6.4* 08/19/2014 1600   ALBUMIN 3.7 08/19/2014 1600   AST 21 08/19/2014 1600   ALT 28 08/19/2014 1600   ALKPHOS 75  08/19/2014 1600   BILITOT 0.5 08/19/2014 1600   GFRNONAA 56* 08/24/2015 0912   GFRAA >60 08/24/2015 0912       Component Value Date/Time   WBC 8.9 08/24/2015 0907   WBC 9.2 08/04/2015 1255   WBC 9.3 07/28/2015 1944   HGB 14.5 08/24/2015 0907   HGB 13.6 08/04/2015 1255   HGB 13.7 07/28/2015 1944   HCT 51.9 08/24/2015 0907   HCT 50.4 08/04/2015 1255   HCT 49.6 07/28/2015 1944   MCV 79.0 08/24/2015 0907   MCV 76.7* 08/04/2015 1255   MCV 76.0* 07/28/2015 1944    Lipid Panel     Component Value Date/Time   CHOL 132 07/30/2015 0441   TRIG 144 07/30/2015 0441   HDL 27* 07/30/2015 0441   CHOLHDL 4.9 07/30/2015 0441   VLDL 29 07/30/2015 0441   LDLCALC 76 07/30/2015 0441    ABG    Component Value Date/Time   PHART 7.352 08/03/2014 1620   PCO2ART 68.2* 08/03/2014 1620   PO2ART 52.3* 08/03/2014 1620   HCO3 36.9* 08/03/2014 1620   TCO2 30.3 08/03/2014 1620   O2SAT 86.0 08/03/2014 1620     Lab Results  Component Value Date   TSH 1.098 08/03/2014   BNP (last 3 results)  Recent Labs  07/28/15 1308  BNP 73.0    ProBNP (last 3 results) No results for input(s): PROBNP in the last 8760 hours.  Cardiac Panel (last 3 results) No results for input(s): CKTOTAL, CKMB, TROPONINI, RELINDX in the last 72 hours.  Iron/TIBC/Ferritin/ %Sat    Component Value Date/Time   FERRITIN 9* 08/24/2015 0907     EKG Orders placed or performed during the hospital encounter of 07/28/15  . ED EKG within 10 minutes  . EKG 12-Lead  . EKG 12-Lead  . EKG 12-Lead  . EKG  . EKG 12-Lead  . EKG 12-Lead  . EKG 12-Lead  . EKG 12-Lead  . EKG 12-Lead  . EKG 12-Lead  . EKG 12-Lead  . EKG 12-Lead     Prior Assessment and Plan Problem List as of 10/28/2015      Cardiovascular and Mediastinum   Acute diastolic CHF (congestive heart failure) (H[facility])   Chronic diastolic heart failure (H[facility])   Essential hypertension     Respiratory   COPD (chronic obstructive pulmonary disease) (H[facility])    Hypoxia   Acute respiratory failure with hypoxia ([facility])   COPD exacerbation ([facility])     Genitourinary   Acute on chronic renal insufficiency ([facility])     Other   Lower extremity edema   Elevated troponin  Dyspnea   Tobacco abuse   Last Assessment & Plan 08/24/2015 Office Visit Edited 08/24/2015 10:24 AM by B[facility], PA-C    He is cutting back on tobacco abuse and he is supported and encouraged to continue; with the end goal of complete cessation.  He report 5-6 cigarettes per week currently.  He continues to work on decreasing tobacco abuse; now with the assistance of Nicoderm managed by primary care provider.  I have discussed lung cancer screening with the patient, and at this time, he wants to consider this surveillance option.  He relays this story to me of a friend with lung cancer and he was a rare smoker.  For this reason he wants to think about this surveillance option.  He is unable to give me a better answer to my question: "What about this story makes you want to wait on CT screening."   A lung cancer screening counseling and shared decision making visit. Age: 52 Pack year smoking history: 50 pack years Current smoker or < 15 years of cessation: Current smoker of tobacco/cigarettes. No current symptoms of lung cancer Risks and benefits of lung cancer screening discussed:  Negative- over-diagnosis, radiation exposure, false positives, and additional testing  Positive- discover early stage lung cancer resulting in higher incidence of cure Patient educated regarding the importance of adherence to continued lung cancer screening. Currently, there are no co-morbidities to prevent treatment to therapy for lung cancer and the patient is agreeable to pursue treatment if a malignancy is discovered.  [place] Preventative Services Task Force recommend annual screening for lung cancer with low-dose CT in adults aged 19- 62 years who have a 30 pack year smoking history and currently smoke or have  quit smoking within the past 15 years.  Screening should be discontinued once a person has not smoked for 15 years or develops a health problem that substantially limits life expectancy or the ability or willingness to have curative lung surgery.  It is a category B recommendation.  Similar stances are provided by CMS, NCCN, and AATS.  Currently, there are no co-morbidities to prevent treatment to therapy for lung cancer and the patient is agreeable to pursue treatment if a malignancy is discovered.      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  JAK/MPL/CALR mutations negative.  He requires intermittent therapeutic phlebotomies to maintain a HCT of 50% or less.  Labs today: CBC diff, ferritin.  Labs in 3 months: CBC diff, ferritin  His HCT is 51.9% and therefore, we will get him set-up for a therapeutic phlebotomy.  Smoking cessation education provided today.  He is down to 5-6 cigarettes per day (1/4 ppd).  He is on Nicoderm that is prescribed by his primary care provider.  He is quitting with his wife.  Return in 3 months for follow-up.        Exertional dyspnea       Imaging: No results found.

## 2015-10-28 NOTE — Progress Notes (Signed)
Cardiology Office Note   Date:  10/28/2015   ID:  Brian Parsons, DOB 10-05-1950, MRN [id number]  PCP:  Brian Fairy, PA-C  Cardiologist: Brian Parsons/  Brian Sims, NP   No chief complaint on file.     History of Present Illness: Brian Parsons is a 65 y.o. male who presents for ongoing assessment and management of chronic diastolic heart failure, most recent EF of 60-65%, most recent LexiScan Myoview revealing RCA distribution ischemia with him at rest noted. The patient had a cardiac catheterization on 08/10/2015 which revealed mild CAD.normal LAD, 30% ostial narrowing of the OM 1 vessel, and left circumflex artery with 40% distal tubular circumflex stenosis, and a large dominant RCA with mid 20% and mid distal stenosis. The patient had preserved LV function of 55%.  On last office is that he again 6 pounds and was complaining of fluid retention. He was to have a low sodium diet, become more active,and continue diuretic therapy.he called our office on 10/28/2015 saying that he is having more lower extremity edema although his weight has not increased. He been taking torsemide and metolazone as directed. He was increased on his torsemide 80 mg daily. He is here for followup for his response to treatment  He comes today with continued complaints of lower M.D. Dema followup lab work reveals creatinine of risen to 1.43. He admits to eating some salty foods. He is very overweight. He has not been active.  Past Medical History  Diagnosis Date  . Essential hypertension   . COPD (chronic obstructive pulmonary disease) ([place])   . Polycythemia   . Tobacco abuse 08/02/2015    Past Surgical History  Procedure Laterality Date  . Cardiac catheterization N/A 08/10/2015    Procedure: Left Heart Cath and Coronary Angiography;  Surgeon: Brian Sine, MD;  Location: [facility];  Service: Cardiovascular;  Laterality: N/A;     Current Outpatient Prescriptions  Medication Sig Dispense Refill   . ADVAIR DISKUS 250-50 MCG/DOSE AEPB USE 1 INHALATION BY MOUTH EVERY 12 HOURS. <<RINSE MOUTH AFTER USE>>. 60 each 3  . aspirin EC 81 MG tablet Take 81 mg by mouth daily.    Marland Kitchen ipratropium-albuterol (DUONEB) 0.5-2.5 (3) MG/3ML SOLN Take 3 mLs by nebulization every 6 (six) hours. (Patient taking differently: Take 3 mLs by nebulization 2 (two) times daily as needed (for wheezing or shortness of breath). ) 360 mL 1  . lisinopril (PRINIVIL,ZESTRIL) 20 MG tablet Take 0.5 tablets (10 mg total) by mouth daily. 30 tablet 1  . MAGNESIUM PO Take 1 tablet by mouth daily.    . nicotine (NICODERM CQ) 21 mg/24hr patch Place 1 patch (21 mg total) onto the skin daily. 28 patch 0  . potassium chloride (K-DUR,KLOR-CON) 10 MEQ tablet Take 10 mEq by mouth daily.    Marland Kitchen torsemide (DEMADEX) 20 MG tablet Take 4 tablets (80 mg total) by mouth daily. 130 tablet 1  . vitamin B-12 (CYANOCOBALAMIN) 1000 MCG tablet Take 1,000 mcg by mouth daily.     No current facility-administered medications for this visit.    Allergies:   Amoxicillin and Penicillins    Social History:  The patient  reports that he has been smoking Cigarettes.  He started smoking about 54 years ago. He has a 25 pack-year smoking history. He has never used smokeless tobacco. He reports that he does not drink alcohol or use illicit drugs.   Family History:  The patient's family history includes Emphysema in his father; Stroke in his  mother.    ROS: All other systems are reviewed and negative. Unless otherwise mentioned in H&P    PHYSICAL EXAM: VS:  BP 130/82 mmHg  Pulse 76  Ht 5' 7.5" (1.715 m)  Wt 248 lb (112.492 kg)  BMI 38.25 kg/m2  SpO2 90% , BMI Body mass index is 38.25 kg/(m^2). GEN: Well nourished, well developed, in no acute distress HEENT: normal Neck: no JVD, carotid bruits, or masses Cardiac: RRR; no murmurs, rubs, or gallops,no edema  Respiratory:  Some inspiratory wheezing, no coughing. GI: soft, nontender, nondistended, + BS.  obese MS: no deformity or atrophy Skin: warm and dry, no rash Neuro:  Strength and sensation are intact Psych: euthymic mood, full affect   Recent Labs: 07/28/2015: B Natriuretic Peptide 73.0 08/24/2015: Hemoglobin 14.5; Platelets 186 10/27/2015: BUN 28*; Creat 1.43*; Potassium 4.2; Sodium 135    Lipid Panel    Component Value Date/Time   CHOL 132 07/30/2015 0441   TRIG 144 07/30/2015 0441   HDL 27* 07/30/2015 0441   CHOLHDL 4.9 07/30/2015 0441   VLDL 29 07/30/2015 0441   LDLCALC 76 07/30/2015 0441      Wt Readings from Last 3 Encounters:  10/28/15 248 lb (112.492 kg)  09/03/15 245 lb (111.131 kg)  08/24/15 245 lb (111.131 kg)     ASSESSMENT AND PLAN:  1.  Chronic lower extremity edema: I believe this is more related to obesity than it is to diastolic heart failure. Increasing the dose of torsemide has caused his kidney function to worsen. I have asked him to hold his torsemide for 24 hours and restart a lower dose. 60 mg daily. I've given him a copy of a low sodium diet. I've also suggested that he become more active and lose weight. I believe some of his lower extremity edema is related to obesity and decreased venous return as a result.  2. Chronic diastolic heart failure:he has not appear to be retaining any fluid on this examination.most recent echocardiogram dated 07/30/2015 revealed moderately dilated RV, there is no mention of diastolic dysfunction. He had normal EF of 60-65%.  3. Hypertension:blood pressure soft today. Believe this is from dehydration on taking too much diuretic. The patient will followup on the diuretic for 24 hours. Reduce his salt intake. And decrease torsemide back to 60 mg daily. This still may be a little too high a dose. He will need to monitor his urine output. Followup labs in 3 months.  4. Ongoing tobacco abuse:he has no intention of quitting at this time. I've advised him on smoking cessation.  5. Obesity: he is requesting appetite  suppressants. I have advised him to see his primary care physician concerning this medication. Would not recommend in the setting of hypertension. I've advised him on a low calorie diet and to increase his activity.  Current medicines are reviewed at length with the patient today.    Labs/ tests ordered today include:  No orders of the defined types were placed in this encounter.     Disposition:   FU with 6 months.  Signed, Brian Sims, NP  10/28/2015 3:16 PM    [facility] [address] [place], [place] [address] Phone: [phone]; Fax: [phone]

## 2015-10-28 NOTE — Patient Instructions (Signed)
Your physician wants you to follow-up in: 6 Months with Dr. Domenic Polite. You will receive a reminder letter in the mail two months in advance. If you don't receive a letter, please call our office to schedule the follow-up appointment.  Your physician has recommended you make the following change in your medication:  Do Not Take Demadex for 24 hours.   Your physician encouraged you to lose weight for better health.  If you need a refill on your cardiac medications before your next appointment, please call your pharmacy.  Thank you for choosing [facility]!  Low-Sodium Eating Plan Sodium raises blood pressure and causes water to be held in the body. Getting less sodium from food will help lower your blood pressure, reduce any swelling, and protect your heart, liver, and kidneys. We get sodium by adding salt (sodium chloride) to food. Most of our sodium comes from canned, boxed, and frozen foods. Restaurant foods, fast foods, and pizza are also very high in sodium. Even if you take medicine to lower your blood pressure or to reduce fluid in your body, getting less sodium from your food is important. WHAT IS MY PLAN? Most people should limit their sodium intake to 2,300 mg a day. Your health care provider recommends that you limit your sodium intake to __________ a day.  WHAT DO I NEED TO KNOW ABOUT THIS EATING PLAN? For the low-sodium eating plan, you will follow these general guidelines:  Choose foods with a % Daily Value for sodium of less than 5% (as listed on the food label).   Use salt-free seasonings or herbs instead of table salt or sea salt.   Check with your health care provider or pharmacist before using salt substitutes.   Eat fresh foods.  Eat more vegetables and fruits.  Limit canned vegetables. If you do use them, rinse them well to decrease the sodium.   Limit cheese to 1 oz (28 g) per day.   Eat lower-sodium products, often labeled as "lower sodium" or "no  salt added."  Avoid foods that contain monosodium glutamate (MSG). MSG is sometimes added to [place] food and some canned foods.  Check food labels (Nutrition Facts labels) on foods to learn how much sodium is in one serving.  Eat more home-cooked food and less restaurant, buffet, and fast food.  When eating at a restaurant, ask that your food be prepared with less salt, or no salt if possible.  HOW DO I READ FOOD LABELS FOR SODIUM INFORMATION? The Nutrition Facts label lists the amount of sodium in one serving of the food. If you eat more than one serving, you must multiply the listed amount of sodium by the number of servings. Food labels may also identify foods as:  Sodium free--Less than 5 mg in a serving.  Very low sodium--35 mg or less in a serving.  Low sodium--140 mg or less in a serving.  Light in sodium--50% less sodium in a serving. For example, if a food that usually has 300 mg of sodium is changed to become light in sodium, it will have 150 mg of sodium.  Reduced sodium--25% less sodium in a serving. For example, if a food that usually has 400 mg of sodium is changed to reduced sodium, it will have 300 mg of sodium. WHAT FOODS CAN I EAT? Grains Low-sodium cereals, including oats, puffed wheat and rice, and shredded wheat cereals. Low-sodium crackers. Unsalted rice and pasta. Lower-sodium bread.  Vegetables Frozen or fresh vegetables. Low-sodium or reduced-sodium  canned vegetables. Low-sodium or reduced-sodium tomato sauce and paste. Low-sodium or reduced-sodium tomato and vegetable juices.  Fruits Fresh, frozen, and canned fruit. Fruit juice.  Meat and Other Protein Products Low-sodium canned tuna and salmon. Fresh or frozen meat, poultry, seafood, and fish. Lamb. Unsalted nuts. Dried beans, peas, and lentils without added salt. Unsalted canned beans. Homemade soups without salt. Eggs.  Dairy Milk. Soy milk. Ricotta cheese. Low-sodium or reduced-sodium cheeses.  Yogurt.  Condiments Fresh and dried herbs and spices. Salt-free seasonings. Onion and garlic powders. Low-sodium varieties of mustard and ketchup. Fresh or refrigerated horseradish. Lemon juice.  Fats and Oils Reduced-sodium salad dressings. Unsalted butter.  Other Unsalted popcorn and pretzels.  The items listed above may not be a complete list of recommended foods or beverages. Contact your dietitian for more options. WHAT FOODS ARE NOT RECOMMENDED? Grains Instant hot cereals. Bread stuffing, pancake, and biscuit mixes. Croutons. Seasoned rice or pasta mixes. Noodle soup cups. Boxed or frozen macaroni and cheese. Self-rising flour. Regular salted crackers. Vegetables Regular canned vegetables. Regular canned tomato sauce and paste. Regular tomato and vegetable juices. Frozen vegetables in sauces. Salted [place] fries. Olives. Angie Fava. Relishes. Sauerkraut. Salsa. Meat and Other Protein Products Salted, canned, smoked, spiced, or pickled meats, seafood, or fish. Bacon, ham, sausage, hot dogs, corned beef, chipped beef, and packaged luncheon meats. Salt pork. Jerky. Pickled herring. Anchovies, regular canned tuna, and sardines. Salted nuts. Dairy Processed cheese and cheese spreads. Cheese curds. Blue cheese and cottage cheese. Buttermilk.  Condiments Onion and garlic salt, seasoned salt, table salt, and sea salt. Canned and packaged gravies. Worcestershire sauce. Tartar sauce. Barbecue sauce. Teriyaki sauce. Soy sauce, including reduced sodium. Steak sauce. Fish sauce. Oyster sauce. Cocktail sauce. Horseradish that you find on the shelf. Regular ketchup and mustard. Meat flavorings and tenderizers. Bouillon cubes. Hot sauce. Tabasco sauce. Marinades. Taco seasonings. Relishes. Fats and Oils Regular salad dressings. Salted butter. Margarine. Ghee. Bacon fat.  Other Potato and tortilla chips. Corn chips and puffs. Salted popcorn and pretzels. Canned or dried soups. Pizza. Frozen  entrees and pot pies.  The items listed above may not be a complete list of foods and beverages to avoid. Contact your dietitian for more information.   This information is not intended to replace advice given to you by your health care provider. Make sure you discuss any questions you have with your health care provider.   Document Released: 09/23/2001 Document Revised: 04/24/2014 Document Reviewed: 02/05/2013 Elsevier Interactive Patient Education [employer].

## 2015-11-01 ENCOUNTER — Telehealth: Payer: Self-pay | Admitting: Adult Health

## 2015-11-01 NOTE — Telephone Encounter (Signed)
I have discussed with patient on last visit that he is getting dehydrated from metolazone based on recent labs. The edema is from obesity and not fluid retention. Will not prescribe any more metolazone. He needs to avoid salt, (he says he doesn't eat salt) but especially foods that are pre-salted.

## 2015-11-01 NOTE — Telephone Encounter (Signed)
Patient would like to speak with nurse regarding fluid in legs / tg

## 2015-11-01 NOTE — Telephone Encounter (Signed)
Message relayed from Arnold Long NP, pt accepted

## 2015-11-01 NOTE — Telephone Encounter (Signed)
Seen 10/28/15, Held torsemide 1 day and then started reduced dose to 60 mg daily.Wt unchanged at 240 lbs, requesting metolazone, states he was given only 1 pill before.

## 2015-11-22 ENCOUNTER — Emergency Department (HOSPITAL_COMMUNITY): Payer: Medicare Other

## 2015-11-22 ENCOUNTER — Emergency Department (HOSPITAL_COMMUNITY)
Admission: EM | Admit: 2015-11-22 | Discharge: 2015-11-22 | Disposition: A | Discharge status: Home / Self Care | Payer: Medicare Other | Attending: Emergency Medicine | Admitting: Emergency Medicine

## 2015-11-22 ENCOUNTER — Encounter (HOSPITAL_COMMUNITY): Payer: Self-pay | Admitting: Emergency Medicine

## 2015-11-22 DIAGNOSIS — Z7982 Long term (current) use of aspirin: Secondary | ICD-10-CM | POA: Diagnosis not present

## 2015-11-22 DIAGNOSIS — M7989 Other specified soft tissue disorders: Secondary | ICD-10-CM | POA: Diagnosis present

## 2015-11-22 DIAGNOSIS — J449 Chronic obstructive pulmonary disease, unspecified: Secondary | ICD-10-CM | POA: Insufficient documentation

## 2015-11-22 DIAGNOSIS — I5031 Acute diastolic (congestive) heart failure: Secondary | ICD-10-CM | POA: Insufficient documentation

## 2015-11-22 DIAGNOSIS — F17210 Nicotine dependence, cigarettes, uncomplicated: Secondary | ICD-10-CM | POA: Insufficient documentation

## 2015-11-22 DIAGNOSIS — R140 Abdominal distension (gaseous): Secondary | ICD-10-CM | POA: Diagnosis not present

## 2015-11-22 DIAGNOSIS — R600 Localized edema: Secondary | ICD-10-CM | POA: Insufficient documentation

## 2015-11-22 DIAGNOSIS — I110 Hypertensive heart disease with heart failure: Secondary | ICD-10-CM | POA: Insufficient documentation

## 2015-11-22 DIAGNOSIS — Z79899 Other long term (current) drug therapy: Secondary | ICD-10-CM | POA: Diagnosis not present

## 2015-11-22 DIAGNOSIS — R609 Edema, unspecified: Secondary | ICD-10-CM

## 2015-11-22 LAB — URINALYSIS, ROUTINE W REFLEX MICROSCOPIC
Bilirubin Urine: NEGATIVE
Glucose, UA: NEGATIVE mg/dL
Hgb urine dipstick: NEGATIVE
Ketones, ur: NEGATIVE mg/dL
LEUKOCYTES UA: NEGATIVE
NITRITE: NEGATIVE
PH: 5.5 (ref 5.0–8.0)
Protein, ur: NEGATIVE mg/dL
SPECIFIC GRAVITY, URINE: 1.01 (ref 1.005–1.030)

## 2015-11-22 LAB — CBC WITH DIFFERENTIAL/PLATELET
BASOS ABS: 0 10*3/uL (ref 0.0–0.1)
Basophils Relative: 0 %
EOS PCT: 2 %
Eosinophils Absolute: 0.2 10*3/uL (ref 0.0–0.7)
HEMATOCRIT: 49.8 % (ref 39.0–52.0)
Hemoglobin: 13.8 g/dL (ref 13.0–17.0)
LYMPHS PCT: 26 %
Lymphs Abs: 2 10*3/uL (ref 0.7–4.0)
MCH: 20.9 pg — ABNORMAL LOW (ref 26.0–34.0)
MCHC: 27.7 g/dL — ABNORMAL LOW (ref 30.0–36.0)
MCV: 75.5 fL — AB (ref 78.0–100.0)
Monocytes Absolute: 0.6 10*3/uL (ref 0.1–1.0)
Monocytes Relative: 8 %
NEUTROS ABS: 5.1 10*3/uL (ref 1.7–7.7)
Neutrophils Relative %: 64 %
PLATELETS: 200 10*3/uL (ref 150–400)
RBC: 6.6 MIL/uL — AB (ref 4.22–5.81)
RDW: 17.8 % — ABNORMAL HIGH (ref 11.5–15.5)
WBC: 7.8 10*3/uL (ref 4.0–10.5)

## 2015-11-22 LAB — BASIC METABOLIC PANEL
ANION GAP: 6 (ref 5–15)
BUN: 25 mg/dL — ABNORMAL HIGH (ref 6–20)
CO2: 37 mmol/L — ABNORMAL HIGH (ref 22–32)
Calcium: 7.8 mg/dL — ABNORMAL LOW (ref 8.9–10.3)
Chloride: 93 mmol/L — ABNORMAL LOW (ref 101–111)
Creatinine, Ser: 1.57 mg/dL — ABNORMAL HIGH (ref 0.61–1.24)
GFR, EST AFRICAN AMERICAN: 51 mL/min — AB (ref >60)
GFR, EST NON AFRICAN AMERICAN: 44 mL/min — AB (ref >60)
GLUCOSE: 122 mg/dL — AB (ref 65–99)
POTASSIUM: 4 mmol/L (ref 3.5–5.1)
Sodium: 136 mmol/L (ref 135–145)

## 2015-11-22 LAB — TROPONIN I: Troponin I: 0.04 ng/mL (ref <0.03)

## 2015-11-22 MED ORDER — FUROSEMIDE 10 MG/ML IJ SOLN
60.0000 mg | Freq: Once | INTRAMUSCULAR | Status: AC
  Administered 2015-11-22: 60 mg via INTRAVENOUS
  Filled 2015-11-22: qty 6

## 2015-11-22 NOTE — ED Provider Notes (Signed)
[facility] DEPT Provider Note   CSN: [id number] Arrival date & time: 11/22/15  1046  First Provider Contact:  First MD Initiated Contact with Patient 11/22/15 1231     By signing my name below, I, Rayna Sexton, attest that this documentation has been prepared under the direction and in the presence of Isla Pence, MD. Electronically Signed: Rayna Sexton, ED Scribe. 11/22/15. 12:39 PM.   History   Chief Complaint Chief Complaint  Patient presents with  . Leg Swelling    HPI HPI Comments: Brian Parsons is a 65 y.o. male with a PMHx of COPD and essential HTN who presents to the Emergency Department complaining of worsening, mild, bilateral leg swelling x 4 weeks. He reports associated, mild, diffuse, abd distension. He has taken 60 mg lasix daily w/o relief. He is a current smoker but is attempting to quit. He denies SOB or any other associated symptoms at this time.   The history is provided by the patient. No language interpreter was used.    Past Medical History:  Diagnosis Date  . COPD (chronic obstructive pulmonary disease) ([place])   . Essential hypertension   . Polycythemia   . Tobacco abuse 08/02/2015    Patient Active Problem List   Diagnosis Date Noted  . Exertional dyspnea   . Abnormal nuclear stress test 08/04/2015  . Chronic diastolic heart failure ([place]) 08/04/2015  . Essential hypertension 08/04/2015  . Polycythemia, secondary 08/04/2015  . COPD exacerbation ([place]) 08/04/2015  . Tobacco abuse 08/02/2015  . Acute on chronic renal insufficiency ([place]) 07/28/2015  . Acute diastolic CHF (congestive heart failure) ([place]) 07/28/2015  . Acute respiratory failure with hypoxia ([place]) 08/04/2014  . COPD (chronic obstructive pulmonary disease) ([place]) 08/03/2014  . Hypoxia 08/03/2014  . Lower extremity edema 08/03/2014  . Elevated troponin 08/03/2014  . Dyspnea 08/03/2014    Past Surgical History:  Procedure Laterality Date  . CARDIAC CATHETERIZATION N/A  08/10/2015   Procedure: Left Heart Cath and Coronary Angiography;  Surgeon: Troy Sine, MD;  Location: [facility];  Service: Cardiovascular;  Laterality: N/A;       Home Medications    Prior to Admission medications   Medication Sig Start Date End Date Taking? Authorizing Provider  ADVAIR DISKUS 250-50 MCG/DOSE AEPB USE 1 INHALATION BY MOUTH EVERY 12 HOURS. <<RINSE MOUTH AFTER USE>>. 10/28/14  Yes Patrici Ranks, MD  aspirin EC 81 MG tablet Take 81 mg by mouth daily.   Yes Historical Provider, MD  Aspirin-Acetaminophen-Caffeine (GOODY HEADACHE PO) Take 1 Package by mouth daily as needed (headache).   Yes Historical Provider, MD  ipratropium-albuterol (DUONEB) 0.5-2.5 (3) MG/3ML SOLN Take 3 mLs by nebulization every 6 (six) hours. Patient taking differently: Take 3 mLs by nebulization 2 (two) times daily as needed (for wheezing or shortness of breath).  08/07/14  Yes Kathie Dike, MD  lisinopril (PRINIVIL,ZESTRIL) 20 MG tablet Take 0.5 tablets (10 mg total) by mouth daily. 08/07/14  Yes Kathie Dike, MD  nicotine (NICODERM CQ) 21 mg/24hr patch Place 1 patch (21 mg total) onto the skin daily. 08/17/15  Yes Lendon Colonel, NP  potassium chloride (K-DUR,KLOR-CON) 10 MEQ tablet Take 10 mEq by mouth daily. 07/05/15  Yes Historical Provider, MD  torsemide (DEMADEX) 20 MG tablet Take 4 tablets (80 mg total) by mouth daily. Patient taking differently: Take 60 mg by mouth daily.  10/21/15  Yes Satira Sark, MD  vitamin B-12 (CYANOCOBALAMIN) 1000 MCG tablet Take 1,000 mcg by mouth daily.  Yes Historical Provider, MD    Family History Family History  Problem Relation Age of Onset  . Stroke Mother   . Emphysema Father     Social History Social History  Substance Use Topics  . Smoking status: Current Some Day Smoker    Packs/day: 0.50    Years: 50.00    Types: Cigarettes    Start date: 04/17/1961  . Smokeless tobacco: Never Used     Comment:  5 ciggs per week / every other  day   . Alcohol use No     Comment: No EtOH for 7 years     Allergies   Amoxicillin and Penicillins   Review of Systems Review of Systems  Respiratory: Negative for shortness of breath.   Cardiovascular: Positive for leg swelling.  Gastrointestinal: Positive for abdominal distention.  All other systems reviewed and are negative.  Physical Exam Updated Vital Signs BP 114/87 (BP Location: Left Wrist)   Pulse 85   Temp 98.7 F (37.1 C) (Oral)   Resp 18   Ht 5' 7.5" (1.715 m)   Wt 240 lb (108.9 kg)   SpO2 95%   BMI 37.03 kg/m   Physical Exam  Constitutional: He is oriented to person, place, and time. He appears well-developed and well-nourished.  HENT:  Head: Normocephalic and atraumatic.  Eyes: EOM are normal.  Neck: Normal range of motion.  Cardiovascular: Normal rate, regular rhythm and normal heart sounds.  Exam reveals no gallop and no friction rub.   No murmur heard. Pulmonary/Chest: Effort normal and breath sounds normal. No respiratory distress. He has no wheezes. He has no rales.  Abdominal: Soft.  Musculoskeletal: Normal range of motion. He exhibits edema.  2+ pitting edema to BLE  Neurological: He is alert and oriented to person, place, and time.  Skin: Skin is warm and dry.  Psychiatric: He has a normal mood and affect.  Nursing note and vitals reviewed.  ED Treatments / Results  Labs (all labs ordered are listed, but only abnormal results are displayed) Labs Reviewed  CBC WITH DIFFERENTIAL/PLATELET - Abnormal; Notable for the following:       Result Value   RBC 6.60 (*)    MCV 75.5 (*)    MCH 20.9 (*)    MCHC 27.7 (*)    RDW 17.8 (*)    All other components within normal limits  BASIC METABOLIC PANEL - Abnormal; Notable for the following:    Chloride 93 (*)    CO2 37 (*)    Glucose, Bld 122 (*)    BUN 25 (*)    Creatinine, Ser 1.57 (*)    Calcium 7.8 (*)    GFR calc non Af Amer 44 (*)    GFR calc Af Amer 51 (*)    All other components  within normal limits  TROPONIN I - Abnormal; Notable for the following:    Troponin I 0.04 (*)    All other components within normal limits  URINALYSIS, ROUTINE W REFLEX MICROSCOPIC (NOT AT [facility])    EKG  EKG Interpretation  Date/Time:  Monday November 22 2015 13:06:59 EDT Ventricular Rate:  84 PR Interval:    QRS Duration: 83 QT Interval:  395 QTC Calculation: 467 R Axis:   11 Text Interpretation:  nsr Abnormal R-wave progression, early transition Minimal ST depression, inferior leads Baseline wander in lead(s) V4 Confirmed by [facility] MD, Ambrosio Reuter ([id number]) on 11/22/2015 1:24:46 PM       Radiology Dg Chest 2 View  Result Date: 11/22/2015 CLINICAL DATA:  Leg swelling and cough for several weeks. EXAM: CHEST  2 VIEW COMPARISON:  07/28/2015 FINDINGS: Heart size is normal. There is no pleural effusion identified. Mild subsegmental atelectasis is noted in the right base. No airspace consolidation. IMPRESSION: 1. Right base subsegmental atelectasis. Electronically Signed   By: Kerby Moors M.D.   On: 11/22/2015 14:11    Procedures Procedures  DIAGNOSTIC STUDIES: Oxygen Saturation is 95% on RA, adequate by my interpretation.    COORDINATION OF CARE: 12:37 PM Discussed next steps with pt. Pt verbalized understanding and is agreeable with the plan.    Medications Ordered in ED Medications  furosemide (LASIX) injection 60 mg (60 mg Intravenous Given 11/22/15 1257)     Initial Impression / Assessment and Plan / ED Course  I have reviewed the triage vital signs and the nursing notes.  Pertinent labs & imaging results that were available during my care of the patient were reviewed by me and considered in my medical decision making (see chart for details).  Clinical Course  I looked at pt's Dr's notes.  His Dr. Did not want to add additional diuretic as it would worsen his kidneys.  Pt was also told by his dr to lose some weight which would help the swelling.  I told the pt that I agree.   He said he would try to walk or to bike.  The pt was also told to get TED hose as that might help the swelling as well.  Pt knows to return if worse and to f/u with his pcp.   I personally performed the services described in this documentation, which was scribed in my presence. The recorded information has been reviewed and is accurate.   Final Clinical Impressions(s) / ED Diagnoses   Final diagnoses:  Peripheral edema    New Prescriptions New Prescriptions   No medications on file     Isla Pence, MD 11/22/15 [phone]

## 2015-11-22 NOTE — ED Triage Notes (Signed)
Pt reports leg swelling and cough for last several weeks. Pt reports was seen for same and was told " not to worry about it." pt denies any pain. nad noted.

## 2015-11-25 ENCOUNTER — Encounter (HOSPITAL_COMMUNITY): Payer: Medicare Other

## 2015-11-25 ENCOUNTER — Encounter (HOSPITAL_COMMUNITY): Payer: Medicare Other | Attending: Oncology | Admitting: Oncology

## 2015-11-25 ENCOUNTER — Encounter (HOSPITAL_COMMUNITY): Payer: Self-pay | Admitting: Oncology

## 2015-11-25 DIAGNOSIS — D751 Secondary polycythemia: Secondary | ICD-10-CM | POA: Insufficient documentation

## 2015-11-25 DIAGNOSIS — Z720 Tobacco use: Secondary | ICD-10-CM

## 2015-11-25 DIAGNOSIS — R0902 Hypoxemia: Secondary | ICD-10-CM | POA: Diagnosis not present

## 2015-11-25 DIAGNOSIS — J449 Chronic obstructive pulmonary disease, unspecified: Secondary | ICD-10-CM | POA: Diagnosis not present

## 2015-11-25 LAB — CBC WITH DIFFERENTIAL/PLATELET
BASOS ABS: 0 10*3/uL (ref 0.0–0.1)
Basophils Relative: 0 %
EOS ABS: 0.2 10*3/uL (ref 0.0–0.7)
EOS PCT: 2 %
HCT: 50.5 % (ref 39.0–52.0)
HEMOGLOBIN: 13.9 g/dL (ref 13.0–17.0)
LYMPHS ABS: 2.8 10*3/uL (ref 0.7–4.0)
LYMPHS PCT: 32 %
MCH: 20.8 pg — AB (ref 26.0–34.0)
MCHC: 27.5 g/dL — ABNORMAL LOW (ref 30.0–36.0)
MCV: 75.6 fL — AB (ref 78.0–100.0)
Monocytes Absolute: 0.6 10*3/uL (ref 0.1–1.0)
Monocytes Relative: 7 %
NEUTROS PCT: 59 %
Neutro Abs: 5.1 10*3/uL (ref 1.7–7.7)
PLATELETS: 189 10*3/uL (ref 150–400)
RBC: 6.68 MIL/uL — AB (ref 4.22–5.81)
RDW: 17.9 % — ABNORMAL HIGH (ref 11.5–15.5)
WBC: 8.8 10*3/uL (ref 4.0–10.5)

## 2015-11-25 LAB — FERRITIN: FERRITIN: 4 ng/mL — AB (ref 24–336)

## 2015-11-25 NOTE — Progress Notes (Signed)
[name], PA-C [address] [place] [address]  Polycythemia, secondary - Plan: CBC, Ferritin, Phlebotomy therapeutic  CURRENT THERAPY: Therapeutic phlebotomy to maintain HCT 50% or less and smoking cessation.  INTERVAL HISTORY: Brian Parsons 65 y.o. 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  JAK/MPL/CALR mutations negative.  Hematologically, the patient denies any complaints.  She continues to work on smoking cessation reporting 5 cigarettes per week. He notes one cigarettes per day when he does smoke. He is quitting with his wife. His wife is also smoking approximate 5 cigarettes per week.  Review of Systems  Constitutional: Negative.  Negative for chills, fever and weight loss.  HENT: Negative.  Negative for tinnitus.   Eyes: Negative.  Negative for blurred vision.  Respiratory: Negative for cough and shortness of breath.   Cardiovascular: Negative.  Negative for chest pain.  Gastrointestinal: Negative.  Negative for constipation, diarrhea, heartburn, nausea and vomiting.  Genitourinary: Negative.   Musculoskeletal: Negative.   Skin: Negative.  Negative for rash.  Neurological: Negative.  Negative for weakness and headaches.  Endo/Heme/Allergies: Negative.   Psychiatric/Behavioral: Negative.     Past Medical History:  Diagnosis Date  . COPD (chronic obstructive pulmonary disease) ([place])   . Essential hypertension   . Polycythemia   . Tobacco abuse 08/02/2015    Past Surgical History:  Procedure Laterality Date  . CARDIAC CATHETERIZATION N/A 08/10/2015   Procedure: Left Heart Cath and Coronary Angiography;  Surgeon: Troy Sine, MD;  Location: [facility];  Service: Cardiovascular;  Laterality: N/A;    Family History  Problem Relation Age of Onset  . Stroke Mother   . Emphysema Father     Social History   Social History  . Marital status: Divorced    Spouse name: N/A    . Number of children: N/A  . Years of education: N/A   Social History Main Topics  . Smoking status: Current Some Day Smoker    Packs/day: 0.50    Years: 50.00    Types: Cigarettes    Start date: 04/17/1961  . Smokeless tobacco: Never Used     Comment:  5 ciggs per week / every other day   . Alcohol use No     Comment: No EtOH for 7 years  . Drug use: No  . Sexual activity: Yes   Other Topics Concern  . None   Social History Narrative  . None     PHYSICAL EXAMINATION  ECOG PERFORMANCE STATUS: 1 - Symptomatic but completely ambulatory  Vitals:   11/25/15 1100  BP: 110/70  Resp: 20  Temp: 98.1 F (36.7 C)    GENERAL:alert, no distress, well nourished, well developed, comfortable, cooperative, obese, smiling and unaccompanied and erythematous face SKIN: no rashes or significant lesions, positive for: Erythematous face HEAD: Normocephalic, No masses, lesions, tenderness or abnormalities EYES: normal, EOMI, Conjunctiva are pink and non-injected EARS: External ears normal OROPHARYNX:lips, buccal mucosa, and tongue normal and mucous membranes are moist  NECK: supple, trachea midline LYMPH:  not examined BREAST:not examined LUNGS: clear to auscultation and percussion, decreased breath sounds HEART: regular rate & rhythm, no murmurs and no gallops ABDOMEN:abdomen soft, obese and normal bowel sounds BACK: Back symmetric, no curvature. EXTREMITIES:less then 2 second capillary refill, no joint deformities, effusion, or inflammation, no skin discoloration, no cyanosis  NEURO: alert & oriented x 3 with fluent speech, no focal motor/sensory deficits,  gait normal   LABORATORY DATA: CBC    Component Value Date/Time   WBC 8.8 11/25/2015 0935   RBC 6.68 (H) 11/25/2015 0935   HGB 13.9 11/25/2015 0935   HCT 50.5 11/25/2015 0935   PLT 189 11/25/2015 0935   MCV 75.6 (L) 11/25/2015 0935   MCH 20.8 (L) 11/25/2015 0935   MCHC 27.5 (L) 11/25/2015 0935   RDW 17.9 (H) 11/25/2015  0935   LYMPHSABS 2.8 11/25/2015 0935   MONOABS 0.6 11/25/2015 0935   EOSABS 0.2 11/25/2015 0935   BASOSABS 0.0 11/25/2015 0935      Chemistry      Component Value Date/Time   NA 136 11/22/2015 1218   K 4.0 11/22/2015 1218   CL 93 (L) 11/22/2015 1218   CO2 37 (H) 11/22/2015 1218   BUN 25 (H) 11/22/2015 1218   CREATININE 1.57 (H) 11/22/2015 1218   CREATININE 1.43 (H) 10/27/2015 0919      Component Value Date/Time   CALCIUM 7.8 (L) 11/22/2015 1218   ALKPHOS 75 08/19/2014 1600   AST 21 08/19/2014 1600   ALT 28 08/19/2014 1600   BILITOT 0.5 08/19/2014 1600        PENDING LABS:   RADIOGRAPHIC STUDIES:  Dg Chest 2 View  Result Date: 11/22/2015 CLINICAL DATA:  Leg swelling and cough for several weeks. EXAM: CHEST  2 VIEW COMPARISON:  07/28/2015 FINDINGS: Heart size is normal. There is no pleural effusion identified. Mild subsegmental atelectasis is noted in the right base. No airspace consolidation. IMPRESSION: 1. Right base subsegmental atelectasis. Electronically Signed   By: Kerby Moors M.D.   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  JAK/MPL/CALR mutations negative.  He requires intermittent therapeutic phlebotomies to maintain a HCT of 50% or less.  Labs today: CBC diff, ferritin.  I personally reviewed and went over laboratory results with the patient.  The results are noted within this dictation. Hematocrit today is 50.5%. As a result, we will get him set up for 1 therapeutic phlebotomy within the next 1-3 weeks.  Iron depletion is part of the patient's therapy and is confirmed with ferritin level in single digits today.  Labs in 3 months: CBC diff, ferritin  His HCT is 50.5% and therefore, we will get him set-up for a therapeutic phlebotomy.  Smoking cessation education provided today.  He is down to 5 cigarettes per week .  He is on Nicoderm that  is prescribed by his primary care provider.  He is quitting with his wife; who is also successful in her smoking cessation program as well.  Return in 3 months for follow-up.     ORDERS PLACED FOR THIS ENCOUNTER: Orders Placed This Encounter  Procedures  . Phlebotomy therapeutic  . CBC  . Ferritin    MEDICATIONS PRESCRIBED THIS ENCOUNTER: No orders of the defined types were placed in this encounter.   THERAPY PLAN:  We will maintain hematocrit at 50% or less with therapeutic phlebotomies. He is going to continue to work on smoking cessation.  All questions were answered. The patient knows to call the clinic with any problems, questions or concerns. We can certainly see the patient much sooner if necessary.  Patient and plan discussed with Dr. Ancil Linsey and she is in agreement with the aforementioned.   This note is electronically signed by: Robynn Pane, PA-C 11/25/2015 7:01 PM

## 2015-11-25 NOTE — Patient Instructions (Signed)
[place] at [facility] Discharge Instructions  RECOMMENDATIONS MADE BY THE CONSULTANT AND ANY TEST RESULTS WILL BE SENT TO YOUR REFERRING PHYSICIAN.  Return in 3 months with labs  Thank you for choosing [place] at [facility] to provide your oncology and hematology care.  To afford each patient quality time with our provider, please arrive at least 15 minutes before your scheduled appointment time.   Beginning January 23rd 2017 lab work for the [facility] will be done in the  Main lab at [facility] on 1st floor. If you have a lab appointment with the [facility] please come in thru the  Main Entrance and check in at the main information desk  You need to re-schedule your appointment should you arrive 10 or more minutes late.  We strive to give you quality time with our providers, and arriving late affects you and other patients whose appointments are after yours.  Also, if you no show three or more times for appointments you may be dismissed from the clinic at the providers discretion.     Again, thank you for choosing [facility].  Our hope is that these requests will decrease the amount of time that you wait before being seen by our physicians.       _____________________________________________________________  Should you have questions after your visit to [facility], please contact our office at [phone] between the hours of 8:30 a.m. and 4:30 p.m.  Voicemails left after 4:30 p.m. will not be returned until the following business day.  For prescription refill requests, have your pharmacy contact our office.         Resources For Cancer Patients and their Caregivers ? American Cancer Society: Can assist with transportation, wigs, general needs, runs Look Good Feel Better.        4322499217 ? Cancer Care: Provides financial assistance, online support groups, medication/co-pay assistance.   1-800-813-HOPE 681-206-5335) ? [place] Assists [place] cancer patients and their families through emotional , educational and financial support.  947-439-6272 ? [place] DSS Where to apply for food stamps, Medicaid and utility assistance. (845)345-5323 ? [facility]: Transportation to medical appointments. 213-767-4160 ? Social Security Administration: May apply for disability if have a Stage IV cancer. (725)370-4511 445-583-4566 ? [employer], Disability and Transit Services: Assists with nutrition, care and transit needs. [place] Support Programs: @10RELATIVEDAYS @ > Cancer Support Group  2nd Tuesday of the month 1pm-2pm, Journey Room  > Creative Journey  3rd Tuesday of the month 1130am-1pm, Journey Room  > Look Good Feel Better  1st Wednesday of the month 10am-12 noon, Journey Room (Call [place] to register [phone])

## 2015-11-25 NOTE — Assessment & Plan Note (Addendum)
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  JAK/MPL/CALR mutations negative.  He requires intermittent therapeutic phlebotomies to maintain a HCT of 50% or less.  Labs today: CBC diff, ferritin.  I personally reviewed and went over laboratory results with the patient.  The results are noted within this dictation. Hematocrit today is 50.5%. As a result, we will get him set up for 1 therapeutic phlebotomy within the next 1-3 weeks.  Iron depletion is part of the patient's therapy and is confirmed with ferritin level in single digits today.  Labs in 3 months: CBC diff, ferritin  His HCT is 50.5% and therefore, we will get him set-up for a therapeutic phlebotomy.  Smoking cessation education provided today.  He is down to 5 cigarettes per week .  He is on Nicoderm that is prescribed by his primary care provider.  He is quitting with his wife; who is also successful in her smoking cessation program as well.  Return in 3 months for follow-up.

## 2015-12-07 ENCOUNTER — Encounter (HOSPITAL_COMMUNITY): Payer: Medicare Other

## 2015-12-13 ENCOUNTER — Encounter (HOSPITAL_COMMUNITY)
Admission: RE | Admit: 2015-12-13 | Discharge: 2015-12-13 | Disposition: A | Discharge status: Home / Self Care | Payer: Medicare Other | Source: Ambulatory Visit | Attending: Hematology & Oncology | Admitting: Hematology & Oncology

## 2016-01-04 ENCOUNTER — Encounter: Payer: Self-pay | Admitting: Cardiology

## 2016-01-04 NOTE — Progress Notes (Deleted)
Cardiology Office Note  Date: 01/04/2016   ID: Brian Parsons, DOB 14-Nov-1950, MRN [id number]  PCP: [name] PA-C  Primary Cardiologist: Rozann Lesches, MD   No chief complaint on file.   History of Present Illness: Brian Parsons is a 65 y.o. male last seen by Ms. Lawrence NP in July. He has a history of chronic recurring leg edema that is most likely secondary to RV dysfunction in association with COPD. LVEF is normal as were diastolic parameters by last echocardiogram.  Records review finds ER visit in early August with leg edema on Demadex 60 mg daily. Weight loss and TED hose were recommended. Creatinine was 1.6.  Past Medical History:  Diagnosis Date  . Chronic diastolic heart failure ([place])   . COPD (chronic obstructive pulmonary disease) ([place])   . Essential hypertension   . Polycythemia     Past Surgical History:  Procedure Laterality Date  . CARDIAC CATHETERIZATION N/A 08/10/2015   Procedure: Left Heart Cath and Coronary Angiography;  Surgeon: Troy Sine, MD;  Location: [facility];  Service: Cardiovascular;  Laterality: N/A;    Current Outpatient Prescriptions  Medication Sig Dispense Refill  . ADVAIR DISKUS 250-50 MCG/DOSE AEPB USE 1 INHALATION BY MOUTH EVERY 12 HOURS. <<RINSE MOUTH AFTER USE>>. 60 each 3  . aspirin EC 81 MG tablet Take 81 mg by mouth daily.    . Aspirin-Acetaminophen-Caffeine (GOODY HEADACHE PO) Take 1 Package by mouth daily as needed (headache).    Marland Kitchen ipratropium-albuterol (DUONEB) 0.5-2.5 (3) MG/3ML SOLN Take 3 mLs by nebulization every 6 (six) hours. (Patient taking differently: Take 3 mLs by nebulization 2 (two) times daily as needed (for wheezing or shortness of breath). ) 360 mL 1  . lisinopril (PRINIVIL,ZESTRIL) 20 MG tablet Take 0.5 tablets (10 mg total) by mouth daily. 30 tablet 1  . nicotine (NICODERM CQ) 21 mg/24hr patch Place 1 patch (21 mg total) onto the skin daily. 28 patch 0  . potassium chloride (K-DUR,KLOR-CON) 10  MEQ tablet Take 10 mEq by mouth daily.    Marland Kitchen torsemide (DEMADEX) 20 MG tablet Take 4 tablets (80 mg total) by mouth daily. (Patient taking differently: Take 60 mg by mouth daily. ) 130 tablet 1  . vitamin B-12 (CYANOCOBALAMIN) 1000 MCG tablet Take 1,000 mcg by mouth daily.     No current facility-administered medications for this visit.    Allergies:  Amoxicillin and Penicillins   Social History: The patient  reports that he has been smoking Cigarettes.  He started smoking about 54 years ago. He has a 25.00 pack-year smoking history. He has never used smokeless tobacco. He reports that he does not drink alcohol or use drugs.   Family History: The patient's family history includes Emphysema in his father; Stroke in his mother.   ROS:  Please see the history of present illness. Otherwise, complete review of systems is positive for {NONE DEFAULTED:18576::"none"}.  All other systems are reviewed and negative.   Physical Exam: VS:  There were no vitals taken for this visit., BMI There is no height or weight on file to calculate BMI.  Wt Readings from Last 3 Encounters:  11/25/15 247 lb (112 kg)  11/22/15 240 lb (108.9 kg)  10/28/15 248 lb (112.5 kg)    General: Obese male, appears comfortable at rest. HEENT: Conjunctiva and lids normal, oropharynx clear. Neck: Supple, no elevated JVP or carotid bruits, no thyromegaly. Lungs: Decreased breath sounds without active wheezing, nonlabored breathing at rest. Cardiac: Regular rate  and rhythm with ectopy, no S3 or significant systolic murmur, no pericardial rub. Abdomen: Protuberant, nontender, bowel sounds present, no guarding or rebound. Extremities: 1-2+ leg edema, distal pulses 2+. Skin: Warm and dry. Musculoskeletal: No kyphosis. Neuropsychiatric: Alert and oriented x3, affect grossly appropriate.  ECG: I personally reviewed the tracing from 11/22/2015 which showed sinus rhythm with PACs and NSST changes.  Recent Labwork: 07/28/2015: B  Natriuretic Peptide 73.0 11/22/2015: BUN 25; Creatinine, Ser 1.57; Potassium 4.0; Sodium 136 11/25/2015: Hemoglobin 13.9; Platelets 189     Component Value Date/Time   CHOL 132 07/30/2015 0441   TRIG 144 07/30/2015 0441   HDL 27 (L) 07/30/2015 0441   CHOLHDL 4.9 07/30/2015 0441   VLDL 29 07/30/2015 0441   LDLCALC 76 07/30/2015 0441    Other Studies Reviewed Today:  Cardiac catheterization 08/10/2015:  3rd Mrg lesion, 40% stenosed.  Ost 1st Mrg to 1st Mrg lesion, 30% stenosed.  Mid RCA lesion, 20% stenosed.  Dist RCA lesion, 20% stenosed.   Mild CAD with a normal LAD; 30% ostial narrowing in the OM1 vessel of the left circumflex artery artery with 40% distal tubular circumflex stenosis, and a large dominant RCA with mild 20% mid and distal stenoses.  Preserved global LV function with an EF of 55%.  LVEDP 15 mm Hg.  Echocardiogram 07/30/2015: Study Conclusions  - Left ventricle: The cavity size was normal. Wall thickness was   increased in a pattern of mild LVH. Systolic function was normal.   The estimated ejection fraction was in the range of 60% to 65%.   Left ventricular diastolic function parameters were normal. - Aortic valve: Mildly calcified annulus. Trileaflet; mildly   thickened leaflets. Valve area (VTI): 2.72 cm^2. Valve area   (Vmax): 2.69 cm^2. - Left atrium: The atrium was mildly dilated. - Right ventricle: The cavity size was moderately dilated. It   shares the apex with the LV. - Right atrium: The atrium was mildly dilated. - Atrial septum: No defect or patent foramen ovale was identified. - Systemic veins: IVC is dilated with normal respiratory variation,   estimated RA pressure is 8 mmHg. - Technically difficult study.  Assessment and Plan:    Current medicines were reviewed with the patient today.  No orders of the defined types were placed in this encounter.   Disposition:  Signed, Satira Sark, MD, [facility] 01/04/2016 9:42 PM    [place] at [place], [address], [place] [address] Phone: [phone]; Fax: [phone]

## 2016-01-05 ENCOUNTER — Ambulatory Visit: Payer: Medicare Other | Admitting: Cardiology

## 2016-01-05 DIAGNOSIS — Z720 Tobacco use: Secondary | ICD-10-CM | POA: Insufficient documentation

## 2016-02-24 ENCOUNTER — Emergency Department (HOSPITAL_COMMUNITY): Payer: Medicare Other

## 2016-02-24 ENCOUNTER — Encounter (HOSPITAL_COMMUNITY): Payer: Self-pay | Admitting: Emergency Medicine

## 2016-02-24 ENCOUNTER — Inpatient Hospital Stay (HOSPITAL_COMMUNITY)
Admission: EM | Admit: 2016-02-24 | Discharge: 2016-02-28 | DRG: 292 | Disposition: A | Discharge status: Home / Self Care | Payer: Medicare Other | Attending: Internal Medicine | Admitting: Internal Medicine

## 2016-02-24 DIAGNOSIS — I10 Essential (primary) hypertension: Secondary | ICD-10-CM | POA: Diagnosis not present

## 2016-02-24 DIAGNOSIS — J9811 Atelectasis: Secondary | ICD-10-CM | POA: Diagnosis present

## 2016-02-24 DIAGNOSIS — I110 Hypertensive heart disease with heart failure: Secondary | ICD-10-CM | POA: Diagnosis not present

## 2016-02-24 DIAGNOSIS — Z825 Family history of asthma and other chronic lower respiratory diseases: Secondary | ICD-10-CM

## 2016-02-24 DIAGNOSIS — F17210 Nicotine dependence, cigarettes, uncomplicated: Secondary | ICD-10-CM | POA: Diagnosis present

## 2016-02-24 DIAGNOSIS — I509 Heart failure, unspecified: Secondary | ICD-10-CM

## 2016-02-24 DIAGNOSIS — Z720 Tobacco use: Secondary | ICD-10-CM | POA: Diagnosis present

## 2016-02-24 DIAGNOSIS — D751 Secondary polycythemia: Secondary | ICD-10-CM | POA: Diagnosis present

## 2016-02-24 DIAGNOSIS — Z7982 Long term (current) use of aspirin: Secondary | ICD-10-CM

## 2016-02-24 DIAGNOSIS — I5020 Unspecified systolic (congestive) heart failure: Secondary | ICD-10-CM

## 2016-02-24 DIAGNOSIS — Z823 Family history of stroke: Secondary | ICD-10-CM | POA: Diagnosis not present

## 2016-02-24 DIAGNOSIS — I5031 Acute diastolic (congestive) heart failure: Secondary | ICD-10-CM | POA: Diagnosis present

## 2016-02-24 DIAGNOSIS — R748 Abnormal levels of other serum enzymes: Secondary | ICD-10-CM

## 2016-02-24 DIAGNOSIS — R7989 Other specified abnormal findings of blood chemistry: Secondary | ICD-10-CM

## 2016-02-24 DIAGNOSIS — I5033 Acute on chronic diastolic (congestive) heart failure: Secondary | ICD-10-CM | POA: Diagnosis present

## 2016-02-24 DIAGNOSIS — R0602 Shortness of breath: Secondary | ICD-10-CM | POA: Diagnosis not present

## 2016-02-24 DIAGNOSIS — J449 Chronic obstructive pulmonary disease, unspecified: Secondary | ICD-10-CM | POA: Diagnosis present

## 2016-02-24 DIAGNOSIS — I5032 Chronic diastolic (congestive) heart failure: Secondary | ICD-10-CM | POA: Diagnosis not present

## 2016-02-24 DIAGNOSIS — R778 Other specified abnormalities of plasma proteins: Secondary | ICD-10-CM | POA: Diagnosis present

## 2016-02-24 DIAGNOSIS — Z9981 Dependence on supplemental oxygen: Secondary | ICD-10-CM | POA: Diagnosis not present

## 2016-02-24 DIAGNOSIS — R0902 Hypoxemia: Secondary | ICD-10-CM | POA: Diagnosis present

## 2016-02-24 HISTORY — DX: Heart failure, unspecified: I50.9

## 2016-02-24 LAB — HEPATIC FUNCTION PANEL
ALBUMIN: 3.7 g/dL (ref 3.5–5.0)
ALK PHOS: 65 U/L (ref 38–126)
ALT: 9 U/L — ABNORMAL LOW (ref 17–63)
AST: 13 U/L — ABNORMAL LOW (ref 15–41)
Bilirubin, Direct: 0.1 mg/dL (ref 0.1–0.5)
Indirect Bilirubin: 0.5 mg/dL (ref 0.3–0.9)
TOTAL PROTEIN: 6.7 g/dL (ref 6.5–8.1)
Total Bilirubin: 0.6 mg/dL (ref 0.3–1.2)

## 2016-02-24 LAB — BRAIN NATRIURETIC PEPTIDE: B NATRIURETIC PEPTIDE 5: 106 pg/mL — AB (ref 0.0–100.0)

## 2016-02-24 LAB — BASIC METABOLIC PANEL
ANION GAP: 7 (ref 5–15)
BUN: 21 mg/dL — ABNORMAL HIGH (ref 6–20)
CALCIUM: 8.7 mg/dL — AB (ref 8.9–10.3)
CO2: 39 mmol/L — ABNORMAL HIGH (ref 22–32)
CREATININE: 1.31 mg/dL — AB (ref 0.61–1.24)
Chloride: 92 mmol/L — ABNORMAL LOW (ref 101–111)
GFR calc Af Amer: >60 mL/min (ref >60)
GFR, EST NON AFRICAN AMERICAN: 55 mL/min — AB (ref >60)
GLUCOSE: 100 mg/dL — AB (ref 65–99)
Potassium: 3.8 mmol/L (ref 3.5–5.1)
Sodium: 138 mmol/L (ref 135–145)

## 2016-02-24 LAB — CBC
HCT: 49.4 % (ref 39.0–52.0)
HEMOGLOBIN: 13 g/dL (ref 13.0–17.0)
MCH: 19.6 pg — ABNORMAL LOW (ref 26.0–34.0)
MCHC: 26.3 g/dL — AB (ref 30.0–36.0)
MCV: 74.6 fL — ABNORMAL LOW (ref 78.0–100.0)
PLATELETS: 208 10*3/uL (ref 150–400)
RBC: 6.62 MIL/uL — ABNORMAL HIGH (ref 4.22–5.81)
RDW: 19.7 % — AB (ref 11.5–15.5)
WBC: 7.9 10*3/uL (ref 4.0–10.5)

## 2016-02-24 LAB — TROPONIN I
TROPONIN I: 0.08 ng/mL — AB (ref <0.03)
TROPONIN I: 0.09 ng/mL — AB (ref <0.03)
Troponin I: 0.1 ng/mL (ref <0.03)

## 2016-02-24 MED ORDER — ACETAMINOPHEN 325 MG PO TABS: 650.0000 mg | ORAL_TABLET | Freq: Four times a day (QID) | ORAL | Status: DC | PRN

## 2016-02-24 MED ORDER — FUROSEMIDE 10 MG/ML IJ SOLN
60.0000 mg | Freq: Two times a day (BID) | INTRAMUSCULAR | Status: DC
  Administered 2016-02-24 – 2016-02-28 (×8): 60 mg via INTRAVENOUS
  Filled 2016-02-24 (×9): qty 6

## 2016-02-24 MED ORDER — ASPIRIN EC 81 MG PO TBEC
81.0000 mg | DELAYED_RELEASE_TABLET | Freq: Every day | ORAL | Status: DC
  Administered 2016-02-25 – 2016-02-28 (×4): 81 mg via ORAL
  Filled 2016-02-24 (×4): qty 1

## 2016-02-24 MED ORDER — NITROGLYCERIN 2 % TD OINT
TOPICAL_OINTMENT | TRANSDERMAL | Status: AC
  Filled 2016-02-24: qty 1

## 2016-02-24 MED ORDER — IPRATROPIUM-ALBUTEROL 0.5-2.5 (3) MG/3ML IN SOLN
3.0000 mL | Freq: Four times a day (QID) | RESPIRATORY_TRACT | Status: DC
  Administered 2016-02-24 – 2016-02-26 (×6): 3 mL via RESPIRATORY_TRACT
  Filled 2016-02-24 (×6): qty 3

## 2016-02-24 MED ORDER — LISINOPRIL 10 MG PO TABS
10.0000 mg | ORAL_TABLET | Freq: Every day | ORAL | Status: DC
  Administered 2016-02-25 – 2016-02-28 (×4): 10 mg via ORAL
  Filled 2016-02-24 (×4): qty 1

## 2016-02-24 MED ORDER — NICOTINE 21 MG/24HR TD PT24
21.0000 mg | MEDICATED_PATCH | Freq: Every day | TRANSDERMAL | Status: DC
  Administered 2016-02-24 – 2016-02-28 (×5): 21 mg via TRANSDERMAL
  Filled 2016-02-24 (×5): qty 1

## 2016-02-24 MED ORDER — SODIUM CHLORIDE 0.9% FLUSH: 3.0000 mL | INTRAVENOUS | Status: DC | PRN

## 2016-02-24 MED ORDER — ONDANSETRON HCL 4 MG/2ML IJ SOLN: 4.0000 mg | Freq: Four times a day (QID) | INTRAMUSCULAR | Status: DC | PRN

## 2016-02-24 MED ORDER — NITROGLYCERIN 2 % TD OINT
1.0000 [in_us] | TOPICAL_OINTMENT | Freq: Once | TRANSDERMAL | Status: AC
  Administered 2016-02-24: 1 [in_us] via TOPICAL
  Filled 2016-02-24: qty 1

## 2016-02-24 MED ORDER — IPRATROPIUM-ALBUTEROL 0.5-2.5 (3) MG/3ML IN SOLN: 3.0000 mL | RESPIRATORY_TRACT | Status: DC | PRN

## 2016-02-24 MED ORDER — MOMETASONE FURO-FORMOTEROL FUM 200-5 MCG/ACT IN AERO
2.0000 | INHALATION_SPRAY | Freq: Two times a day (BID) | RESPIRATORY_TRACT | Status: DC
  Administered 2016-02-24 – 2016-02-28 (×7): 2 via RESPIRATORY_TRACT
  Filled 2016-02-24: qty 8.8

## 2016-02-24 MED ORDER — ACETAMINOPHEN 325 MG PO TABS
650.0000 mg | ORAL_TABLET | ORAL | Status: DC | PRN
  Administered 2016-02-25 – 2016-02-27 (×2): 650 mg via ORAL
  Filled 2016-02-24 (×2): qty 2

## 2016-02-24 MED ORDER — SODIUM CHLORIDE 0.9 % IV SOLN: 250.0000 mL | INTRAVENOUS | Status: DC | PRN

## 2016-02-24 MED ORDER — IPRATROPIUM-ALBUTEROL 0.5-2.5 (3) MG/3ML IN SOLN
3.0000 mL | RESPIRATORY_TRACT | Status: DC | PRN
  Filled 2016-02-24: qty 3

## 2016-02-24 MED ORDER — VITAMIN B-12 1000 MCG PO TABS
1000.0000 ug | ORAL_TABLET | Freq: Every day | ORAL | Status: DC
  Administered 2016-02-25 – 2016-02-28 (×4): 1000 ug via ORAL
  Filled 2016-02-24 (×4): qty 1

## 2016-02-24 MED ORDER — FUROSEMIDE 10 MG/ML IJ SOLN
80.0000 mg | Freq: Once | INTRAMUSCULAR | Status: AC
  Administered 2016-02-24: 80 mg via INTRAVENOUS
  Filled 2016-02-24: qty 8

## 2016-02-24 MED ORDER — SODIUM CHLORIDE 0.9% FLUSH
3.0000 mL | Freq: Two times a day (BID) | INTRAVENOUS | Status: DC
  Administered 2016-02-24 – 2016-02-28 (×9): 3 mL via INTRAVENOUS

## 2016-02-24 MED ORDER — HEPARIN SODIUM (PORCINE) 5000 UNIT/ML IJ SOLN
5000.0000 [IU] | Freq: Three times a day (TID) | INTRAMUSCULAR | Status: DC
  Administered 2016-02-24 – 2016-02-28 (×13): 5000 [IU] via SUBCUTANEOUS
  Filled 2016-02-24 (×13): qty 1

## 2016-02-24 NOTE — ED Notes (Signed)
CRITICAL VALUE ALERT  Critical value received: 0.08 Troponin  Date of notification:  02/24/16  Time of notification:  1117  Critical value read back:YES Nurse who received alert:  Sheral Flow  MD notified (1st page):  Dr Dewayne Hatch  Time of first page: 1118

## 2016-02-24 NOTE — H&P (Signed)
History and Physical    Brian Parsons [id number] DOB: 30-Jan-1951 DOA: 02/24/2016  PCP: [name], PA-C  Patient coming from: Home  Chief Complaint: sob  HPI: Brian Parsons is a 65 y.o. male with medical history significant of CHF, COPD, HTN, polycythemia, CAD who presents to the ED with sob, recent weight gain, 3 pounds per pt. Reports being compliant with medications. Reports recently being transitioned to torsemide. Despite diuretics, patient reported swelling continued to worsen. Reports orthopnea, denies chest pain..  ED Course: In the ED, pt was noted to have marked B LE edema. BNP noted to be 106. Troponin noted to be borderline elevated at 0.08. EKG noted to have nonspecific T wave abnormalities. Cardiology called by EDP who did not recommend further work up. Patient given one dose of IV lasix and hospitalist consulted for consideration for admission.  Review of Systems:  Review of Systems  Constitutional: Negative for chills, fever and weight loss.  HENT: Negative for congestion, ear discharge and ear pain.   Eyes: Negative for double vision, pain and discharge.  Respiratory: Positive for shortness of breath. Negative for hemoptysis and sputum production.   Cardiovascular: Positive for orthopnea and leg swelling. Negative for chest pain.  Gastrointestinal: Negative for abdominal pain, diarrhea and vomiting.  Genitourinary: Negative for flank pain, frequency and urgency.  Musculoskeletal: Negative for back pain, falls and neck pain.  Neurological: Negative for tingling, tremors, seizures and loss of consciousness.  Psychiatric/Behavioral: Negative for hallucinations, memory loss and substance abuse.    Past Medical History:  Diagnosis Date  . Bilateral leg edema   . CHF (congestive heart failure) ([place])   . COPD (chronic obstructive pulmonary disease) ([place])   . Essential hypertension   . Polycythemia     Past Surgical History:  Procedure Laterality Date  .  CARDIAC CATHETERIZATION N/A 08/10/2015   Procedure: Left Heart Cath and Coronary Angiography;  Surgeon: Troy Sine, MD;  Location: [facility];  Service: Cardiovascular;  Laterality: N/A;     reports that he has been smoking Cigarettes.  He started smoking about 54 years ago. He has a 25.00 pack-year smoking history. He has never used smokeless tobacco. He reports that he does not drink alcohol or use drugs.  Allergies  Allergen Reactions  . Amoxicillin Hives  . Penicillins Hives    Has patient had a PCN reaction causing immediate rash, facial/tongue/throat swelling, SOB or lightheadedness with hypotension: Unknown Has patient had a PCN reaction causing severe rash involving mucus membranes or skin necrosis: No Has patient had a PCN reaction that required hospitalization No Has patient had a PCN reaction occurring within the last 10 years: No If all of the above answers are "NO", then may proceed with Cephalosporin use.     Family History  Problem Relation Age of Onset  . Stroke Mother   . Emphysema Father     Prior to Admission medications   Medication Sig Start Date End Date Taking? Authorizing Provider  ADVAIR DISKUS 250-50 MCG/DOSE AEPB USE 1 INHALATION BY MOUTH EVERY 12 HOURS. <<RINSE MOUTH AFTER USE>>. 10/28/14  Yes Patrici Ranks, MD  aspirin EC 81 MG tablet Take 81 mg by mouth daily.   Yes Historical Provider, MD  Aspirin-Acetaminophen-Caffeine (GOODY HEADACHE PO) Take 1 Package by mouth daily as needed (headache).   Yes Historical Provider, MD  ipratropium-albuterol (DUONEB) 0.5-2.5 (3) MG/3ML SOLN Take 3 mLs by nebulization every 6 (six) hours. Patient taking differently: Take 3 mLs by nebulization 2 (  two) times daily as needed (for wheezing or shortness of breath).  08/07/14  Yes Kathie Dike, MD  lisinopril (PRINIVIL,ZESTRIL) 20 MG tablet Take 0.5 tablets (10 mg total) by mouth daily. 08/07/14  Yes Kathie Dike, MD  nicotine (NICODERM CQ) 21 mg/24hr patch  Place 1 patch (21 mg total) onto the skin daily. 08/17/15  Yes Lendon Colonel, NP  potassium chloride (K-DUR,KLOR-CON) 10 MEQ tablet Take 10 mEq by mouth daily. 07/05/15  Yes Historical Provider, MD  torsemide (DEMADEX) 20 MG tablet Take 4 tablets (80 mg total) by mouth daily. Patient taking differently: Take 60 mg by mouth daily.  10/21/15  Yes Satira Sark, MD  vitamin B-12 (CYANOCOBALAMIN) 1000 MCG tablet Take 1,000 mcg by mouth daily.   Yes Historical Provider, MD    Physical Exam: Vitals:   02/24/16 1100 02/24/16 1130 02/24/16 1200 02/24/16 1230  BP: 115/69 99/60 108/65 104/62  Pulse: 94 88 86 90  Resp:  26 19 21   Temp:      TempSrc:      SpO2: 98% 93% 97% 96%  Weight:      Height:        Constitutional: NAD, calm, comfortable Vitals:   02/24/16 1100 02/24/16 1130 02/24/16 1200 02/24/16 1230  BP: 115/69 99/60 108/65 104/62  Pulse: 94 88 86 90  Resp:  26 19 21   Temp:      TempSrc:      SpO2: 98% 93% 97% 96%  Weight:      Height:       Eyes: PERRL, lids and conjunctivae normal ENMT: Mucous membranes are moist. Posterior pharynx clear of any exudate or lesions. Dentition fair.  Neck: normal, supple, no masses, no thyromegaly Respiratory: clear to auscultation bilaterally, end-expiratory wheezing Cardiovascular: Regular rate and rhythm, s1, s2.  Abdomen: no tenderness, no masses palpated. No hepatosplenomegaly. Bowel sounds positive.  Musculoskeletal: no clubbing / cyanosis. No joint deformity upper and lower extremities. Good ROM, no contractures. Normal muscle tone.  Skin: no rashes, lesions Neurologic: CN 2-12 grossly intact. Sensation intact, DTR normal. Strength 5/5 in all 4.  Psychiatric: Normal judgment and insight. Alert and oriented x 3. Normal mood.    Labs on Admission: I have personally reviewed following labs and imaging studies  CBC:  Recent Labs Lab 02/24/16 1024  WBC 7.9  HGB 13.0  HCT 49.4  MCV 74.6*  PLT [id number]   Basic Metabolic  Panel:  Recent Labs Lab 02/24/16 1024  NA 138  K 3.8  CL 92*  CO2 39*  GLUCOSE 100*  BUN 21*  CREATININE 1.31*  CALCIUM 8.7*   GFR: Estimated Creatinine Clearance: 65.1 mL/min (by C-G formula based on SCr of 1.31 mg/dL (H)). Liver Function Tests:  Recent Labs Lab 02/24/16 1024  AST 13*  ALT 9*  ALKPHOS 65  BILITOT 0.6  PROT 6.7  ALBUMIN 3.7   No results for input(s): LIPASE, AMYLASE in the last 168 hours. No results for input(s): AMMONIA in the last 168 hours. Coagulation Profile: No results for input(s): INR, PROTIME in the last 168 hours. Cardiac Enzymes:  Recent Labs Lab 02/24/16 1024  TROPONINI 0.08*   BNP (last 3 results) No results for input(s): PROBNP in the last 8760 hours. HbA1C: No results for input(s): HGBA1C in the last 72 hours. CBG: No results for input(s): GLUCAP in the last 168 hours. Lipid Profile: No results for input(s): CHOL, HDL, LDLCALC, TRIG, CHOLHDL, LDLDIRECT in the last 72 hours. Thyroid Function Tests: No results for  input(s): TSH, T4TOTAL, FREET4, T3FREE, THYROIDAB in the last 72 hours. Anemia Panel: No results for input(s): VITAMINB12, FOLATE, FERRITIN, TIBC, IRON, RETICCTPCT in the last 72 hours. Urine analysis:    Component Value Date/Time   COLORURINE YELLOW 11/22/2015 [place] 11/22/2015 1403   LABSPEC 1.010 11/22/2015 1403   PHURINE 5.5 11/22/2015 1403   GLUCOSEU NEGATIVE 11/22/2015 1403   HGBUR NEGATIVE 11/22/2015 1403   BILIRUBINUR NEGATIVE 11/22/2015 1403   KETONESUR NEGATIVE 11/22/2015 1403   PROTEINUR NEGATIVE 11/22/2015 1403   NITRITE NEGATIVE 11/22/2015 1403   LEUKOCYTESUR NEGATIVE 11/22/2015 1403   Sepsis Labs: !!!!!!!!!!!!!!!!!!!!!!!!!!!!!!!!!!!!!!!!!!!! @LABRCNTIP (procalcitonin:4,lacticidven:4) )No results found for this or any previous visit (from the past 240 hour(s)).   Radiological Exams on Admission: Dg Chest 2 View  Result Date: 02/24/2016 CLINICAL DATA:  Shortness of breath  with abdominal pain and swelling. EXAM: CHEST  2 VIEW COMPARISON:  11/22/2015 FINDINGS: Artifact overlies the chest. Heart size is at the upper limits of normal. Mediastinal shadows are normal. Upper lungs are clear. Slight prominent interstitial lung markings persist at the bases. No sign of infiltrate, collapse or effusion. No acute bone finding. IMPRESSION: Chronic interstitial lung markings, basilar predominant. No active process. Electronically Signed   By: Nelson Chimes M.D.   On: 02/24/2016 10:49    EKG: Independently reviewed. Nonspecific T wave changes  Assessment/Plan Principal Problem:   CHF (congestive heart failure) (H[facility]) Active Problems:   COPD (chronic obstructive pulmonary disease) (H[facility])   Elevated troponin   Tobacco abuse   Chronic diastolic heart failure ([facility])   Essential hypertension   Polycythemia, secondary   1.  Acute diastolic CHF exacerbation 1. Increased orthopnea, wt gain, LE swelling 2. Most recent 2d echo from 4/17 with EF of 60-65%. On that study, LV diastolic function was normal 3. Will continue scheduled lasix as tolerated 4. Monitor I/o and daily weights 5. Repeat bmet in AM 2. COPD 1. End-expiratory wheezing auscultated on exam 2. On min O2 support 3. Will continue scheduled duonebs as needed 3. Elevated troponin 1. Trop of 0.08 2. Denies chest pain 3. Will follow serial trop 4. EDP discussed with Cardiology who reportedly recommended no further work up 4. Tobacco abuse 1. Cessation done. Patient actively trying to quit and has been using nicotine patches prior to admission 2. Will re-order patch 5. HTN 1. BP stable 2. Cont home meds 6. 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 Will follow CBC  DVT prophylaxis: Heparin subQ  Code Status: Full Family Communication: Pt in room  Disposition Plan: uncertain at this time  Consults called:  Admission status: med-tele, inpt   Silviano Neuser, Orpah Melter MD [facility] Pager [phone]  If 7PM-7AM, please contact night-coverage [email or web address] Password [email or web address]  02/24/2016, 12:55 PM

## 2016-02-24 NOTE — ED Provider Notes (Signed)
[facility] DEPT Provider Note   CSN: [id number] Arrival date & time: 02/24/16  0920  By signing my name below, I, Rayna Sexton, attest that this documentation has been prepared under the direction and in the presence of Milton Ferguson, MD. Electronically Signed: Rayna Sexton, ED Scribe. 02/24/16. 10:21 AM.   History   Chief Complaint Chief Complaint  Patient presents with  . Shortness of Breath    HPI HPI Comments: Brian Parsons is a 64 y.o. male with a h/o COPD and CHF who presents to the Emergency Department complaining of constant, moderate, SOB x 1 day. He reports associated abdominal distension, leg swelling and recent weight gain noting he has gained ~3-5 lbs in the past four days. He is on home O2. He takes diuretics and confirms being compliant. Pt denies any regions of pain or other associated symptoms at this time.    Patient complains of shortness of breath and swelling in legs   The history is provided by the patient. No language interpreter was used.  Shortness of Breath  This is a recurrent problem. The average episode lasts 1 day. The problem occurs frequently.The current episode started yesterday. The problem has not changed since onset.Associated symptoms include wheezing and leg swelling. Pertinent negatives include no headaches, no cough, no chest pain, no abdominal pain and no rash. It is unknown what precipitated the problem. Risk factors include smoking. Associated medical issues include COPD and heart failure.    Past Medical History:  Diagnosis Date  . Bilateral leg edema   . CHF (congestive heart failure) ([place])   . COPD (chronic obstructive pulmonary disease) ([place])   . Essential hypertension   . Polycythemia     Patient Active Problem List   Diagnosis Date Noted  . Exertional dyspnea   . Abnormal nuclear stress test 08/04/2015  . Chronic diastolic heart failure ([place]) 08/04/2015  . Essential hypertension 08/04/2015  . Polycythemia, secondary  08/04/2015  . COPD exacerbation ([place]) 08/04/2015  . Tobacco abuse 08/02/2015  . Acute on chronic renal insufficiency 07/28/2015  . Acute diastolic CHF (congestive heart failure) ([place]) 07/28/2015  . Acute respiratory failure with hypoxia ([place]) 08/04/2014  . COPD (chronic obstructive pulmonary disease) ([place]) 08/03/2014  . Hypoxia 08/03/2014  . Lower extremity edema 08/03/2014  . Elevated troponin 08/03/2014  . Dyspnea 08/03/2014    Past Surgical History:  Procedure Laterality Date  . CARDIAC CATHETERIZATION N/A 08/10/2015   Procedure: Left Heart Cath and Coronary Angiography;  Surgeon: Troy Sine, MD;  Location: [facility];  Service: Cardiovascular;  Laterality: N/A;       Home Medications    Prior to Admission medications   Medication Sig Start Date End Date Taking? Authorizing Provider  ADVAIR DISKUS 250-50 MCG/DOSE AEPB USE 1 INHALATION BY MOUTH EVERY 12 HOURS. <<RINSE MOUTH AFTER USE>>. 10/28/14   Patrici Ranks, MD  aspirin EC 81 MG tablet Take 81 mg by mouth daily.    Historical Provider, MD  Aspirin-Acetaminophen-Caffeine (GOODY HEADACHE PO) Take 1 Package by mouth daily as needed (headache).    Historical Provider, MD  ipratropium-albuterol (DUONEB) 0.5-2.5 (3) MG/3ML SOLN Take 3 mLs by nebulization every 6 (six) hours. Patient taking differently: Take 3 mLs by nebulization 2 (two) times daily as needed (for wheezing or shortness of breath).  08/07/14   Kathie Dike, MD  lisinopril (PRINIVIL,ZESTRIL) 20 MG tablet Take 0.5 tablets (10 mg total) by mouth daily. 08/07/14   Kathie Dike, MD  nicotine (NICODERM CQ) 21 mg/24hr  patch Place 1 patch (21 mg total) onto the skin daily. 08/17/15   Lendon Colonel, NP  potassium chloride (K-DUR,KLOR-CON) 10 MEQ tablet Take 10 mEq by mouth daily. 07/05/15   Historical Provider, MD  torsemide (DEMADEX) 20 MG tablet Take 4 tablets (80 mg total) by mouth daily. Patient taking differently: Take 60 mg by mouth daily.  10/21/15    Satira Sark, MD  vitamin B-12 (CYANOCOBALAMIN) 1000 MCG tablet Take 1,000 mcg by mouth daily.    Historical Provider, MD    Family History Family History  Problem Relation Age of Onset  . Stroke Mother   . Emphysema Father     Social History Social History  Substance Use Topics  . Smoking status: Current Some Day Smoker    Packs/day: 0.50    Years: 50.00    Types: Cigarettes    Start date: 04/17/1961  . Smokeless tobacco: Never Used     Comment:  5 ciggs per week / every other day   . Alcohol use No     Comment: No EtOH for 7 years     Allergies   Amoxicillin and Penicillins   Review of Systems Review of Systems  Constitutional: Positive for unexpected weight change. Negative for appetite change and fatigue.  HENT: Negative for congestion, ear discharge and sinus pressure.   Eyes: Negative for discharge.  Respiratory: Positive for shortness of breath and wheezing. Negative for cough.   Cardiovascular: Positive for leg swelling. Negative for chest pain.  Gastrointestinal: Positive for abdominal distention. Negative for abdominal pain and diarrhea.  Genitourinary: Negative for frequency and hematuria.  Musculoskeletal: Negative for back pain.  Skin: Negative for rash.  Neurological: Negative for seizures and headaches.  Psychiatric/Behavioral: Negative for hallucinations.   Physical Exam Updated Vital Signs BP 132/71 (BP Location: Right Arm)   Pulse 98   Temp 97.7 F (36.5 C) (Oral)   Resp 24   Ht 5\' 7"  (1.702 m)   Wt 245 lb (111.1 kg)   SpO2 94%   BMI 38.37 kg/m   Physical Exam  Constitutional: He is oriented to person, place, and time. He appears well-developed.  HENT:  Head: Normocephalic.  Eyes: Conjunctivae and EOM are normal. No scleral icterus.  Neck: Neck supple. No thyromegaly present.  Cardiovascular: Normal rate and regular rhythm.  Exam reveals no gallop and no friction rub.   No murmur heard. Pulmonary/Chest: No stridor. He has wheezes  (moderate bilaterally). He has no rales. He exhibits no tenderness.  Abdominal: He exhibits distension. There is no tenderness. There is no rebound.  Musculoskeletal: Normal range of motion. He exhibits edema.  3+ edema noted in the bilateral ankles  Lymphadenopathy:    He has no cervical adenopathy.  Neurological: He is oriented to person, place, and time. He exhibits normal muscle tone. Coordination normal.  Skin: No rash noted. No erythema.  Psychiatric: He has a normal mood and affect. His behavior is normal.   ED Treatments / Results  Labs (all labs ordered are listed, but only abnormal results are displayed) Labs Reviewed  BASIC METABOLIC PANEL - Abnormal; Notable for the following:       Result Value   Chloride 92 (*)    CO2 39 (*)    Glucose, Bld 100 (*)    BUN 21 (*)    Creatinine, Ser 1.31 (*)    Calcium 8.7 (*)    GFR calc non Af Amer 55 (*)    All other components within normal  limits  HEPATIC FUNCTION PANEL - Abnormal; Notable for the following:    AST 13 (*)    ALT 9 (*)    All other components within normal limits  BRAIN NATRIURETIC PEPTIDE - Abnormal; Notable for the following:    B Natriuretic Peptide 106.0 (*)    All other components within normal limits  TROPONIN I - Abnormal; Notable for the following:    Troponin I 0.08 (*)    All other components within normal limits  CBC    EKG  EKG Interpretation  Date/Time:  Thursday February 24 2016 09:43:22 EST Ventricular Rate:  91 PR Interval:  136 QRS Duration: 76 QT Interval:  364 QTC Calculation: 447 R Axis:   13 Text Interpretation:  Normal sinus rhythm with sinus arrhythmia ST & T wave abnormality, consider inferior ischemia ST & T wave abnormality, consider anterolateral ischemia Abnormal ECG Confirmed by Burlene Montecalvo  MD, Novalie Leamy ([id number]) on 02/24/2016 10:06:14 AM       Radiology No results found.  Procedures Procedures  COORDINATION OF CARE: 10:00 AM Discussed next steps with pt. Pt verbalized  understanding and is agreeable with the plan.    Medications Ordered in ED Medications  furosemide (LASIX) injection 80 mg (not administered)     Initial Impression / Assessment and Plan / ED Course  I have reviewed the triage vital signs and the nursing notes.  Pertinent labs & imaging results that were available during my care of the patient were reviewed by me and considered in my medical decision making (see chart for details).  Clinical Course    Patient with congestive heart failure and elevated troponin. Patient will be admitted to medicine for diuresis.j  Final Clinical Impressions(s) / ED Diagnoses   Final diagnoses:  None    New Prescriptions New Prescriptions   No medications on file     Milton Ferguson, MD 02/24/16 1254

## 2016-02-24 NOTE — ED Triage Notes (Signed)
Patient states "I feel like I've got fluid built up around my stomach again." Also complains of "some shortness of breath." Denies pain at this time.

## 2016-02-25 ENCOUNTER — Other Ambulatory Visit (HOSPITAL_COMMUNITY): Payer: Medicare Other

## 2016-02-25 ENCOUNTER — Ambulatory Visit (HOSPITAL_COMMUNITY): Payer: Medicare Other | Admitting: Oncology

## 2016-02-25 LAB — BASIC METABOLIC PANEL
ANION GAP: 8 (ref 5–15)
BUN: 21 mg/dL — AB (ref 6–20)
CALCIUM: 8.4 mg/dL — AB (ref 8.9–10.3)
CO2: 35 mmol/L — ABNORMAL HIGH (ref 22–32)
Chloride: 96 mmol/L — ABNORMAL LOW (ref 101–111)
Creatinine, Ser: 1.3 mg/dL — ABNORMAL HIGH (ref 0.61–1.24)
GFR calc Af Amer: >60 mL/min (ref >60)
GFR, EST NON AFRICAN AMERICAN: 55 mL/min — AB (ref >60)
GLUCOSE: 103 mg/dL — AB (ref 65–99)
POTASSIUM: 4.3 mmol/L (ref 3.5–5.1)
SODIUM: 139 mmol/L (ref 135–145)

## 2016-02-25 LAB — TROPONIN I: TROPONIN I: 0.08 ng/mL — AB (ref <0.03)

## 2016-02-25 NOTE — Care Management Important Message (Signed)
Important Message  Patient Details  Name: Brian Parsons MRN: [id number] Date of Birth: Aug 01, 1950   Medicare Important Message Given:  Yes    Sherald Barge, RN 02/25/2016, 8:13 AM

## 2016-02-25 NOTE — Plan of Care (Signed)
Problem: Food- and Nutrition-Related Knowledge Deficit (NB-1.1) Goal: Nutrition education Formal process to instruct or train a patient/client in a skill or to impart knowledge to help patients/clients voluntarily manage or modify food choices and eating behavior to maintain or improve health. Outcome: Adequate for Discharge Nutrition Education Note  RD consulted for nutrition education regarding CHF and weight loss.  RD provided "Eating Plan for Heart Failure" which includes tips for weight loss.  Reviewed patient's dietary recall. He usually has cereal or scrambled eggs for breakfast, skips lunch and his daughter usually cooks dinner. According to pt report he avoids fried foods and denies adding salt to his food. 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   Provided examples on ways to decrease sodium intake in diet. Discouraged intake of processed foods. Encouraged fresh fruits and vegetables as well as whole grain sources of carbohydrates to maximize fiber intake.   RD discussed why it is important for patient to adhere to diet recommendations, and emphasized the role of fluids, foods to avoid, and importance of weighing self daily. Teach back method used.  Expect good compliance. Recommended pt to ask MD what his daily fluid intake goal will be at home and whether pt is cleared to increase his activity level. Encouraged him to start with a goal of 5-10% loss of present weight.  Body mass index is 38.72 kg/m. Pt meets criteria for obesity class II based on current BMI.  Current diet order is Heart Healthy/CHO modified, patient is consuming approximately 75-100% of meals at this time. Labs and medications reviewed. No further nutrition interventions warranted at this time.   Colman Cater MS,RD,CSG,LDN Office: [phone] Pager: [phone]

## 2016-02-25 NOTE — Progress Notes (Signed)
PROGRESS NOTE    Brian Parsons  [id number] DOB: January 20, 1951 DOA: 02/24/2016 PCP: [name], PA-C    Brief Narrative:  65 y.o. male with medical history significant of CHF, COPD, HTN, polycythemia, CAD who presents to the ED with sob, recent weight gain, 3 pounds per pt. Reports being compliant with medications. Reports recently being transitioned to torsemide. Despite diuretics, patient reported swelling continued to worsen. Reports orthopnea, denies chest pain  Assessment & Plan:   Principal Problem:   CHF (congestive heart failure) ([facility]) Active Problems:   COPD (chronic obstructive pulmonary disease) ([facility])   Elevated troponin   Acute diastolic CHF (congestive heart failure) ([facility])   Tobacco abuse   Chronic diastolic heart failure ([facility])   Essential hypertension   Polycythemia, secondary  1.  Acute diastolic CHF exacerbation 1. Patient presented with increased lower extremity edema and subjective weight gain 2. Dry weight noted to be 108 kg 3. Weight at admission 113.3 kg 4. Weight today 112.1 kg 5. -most recent 2-D echocardiogram from April 2017 reveals EF of 123456, LV diastolic function appeared normal on that study. 6. Patient is continued on 40 mg IV twice a day Lasix 7. Patient has reported rectal improvement thus far 8. 2 to monitor I's and O's and daily weights 9. Repeat basic metabolic panel morning, renal function thus far stable 2. COPD 1. Patient presented with end expiratory wheezing auscultated on exam 2. Patient remains on minimal O2 support 3. Patient has reported improvement with scheduled DuoNeb's overnight 4. No audible wheezing this morning 3. Elevated troponin 1. Presenting Trop of 0.08 2. Ratio remains without chest discomfort 3. Suspect troponin leak in the setting of acutely decompensated diastolic heart failure 4. Tobacco abuse 1. Cessation done at the time of admission. 2. Patient is continued on nicotine patch stable 5. HTN 1. Or  pressure remained stable, controlled 2. Continuing home regimen 6. Pylycythemia 1. Hemoglobin stable at this time. 2. Repeat CBC in the morning  DVT prophylaxis: Heparin subcutaneous Code Status: Full code Family Communication: Patient in room, family not at bedside Disposition Plan: Uncertain at this time  Consultants:     Procedures:     Antimicrobials: Anti-infectives    None       Subjective: No complaints at this time, feels better today  Objective: Vitals:   02/24/16 1951 02/25/16 0258 02/25/16 0446 02/25/16 0721  BP: 122/71  118/72   Pulse: 94  (!) 104   Resp: 20  20   Temp: 98.3 F (36.8 C)  97.4 F (36.3 C)   TempSrc: Oral  Oral   SpO2: 95% 92% 93% (!) 89%  Weight:   112.1 kg (247 lb 3.2 oz)   Height:        Intake/Output Summary (Last 24 hours) at 02/25/16 1327 Last data filed at 02/25/16 1100  Gross per 24 hour  Intake              480 ml  Output              450 ml  Net               30 ml   Filed Weights   02/24/16 0938 02/24/16 1504 02/25/16 0446  Weight: 111.1 kg (245 lb) 113.4 kg (249 lb 14.4 oz) 112.1 kg (247 lb 3.2 oz)    Examination:  General exam: Appears calm and comfortable  Respiratory system: Clear to auscultation. Respiratory effort normal, No wheezing Cardiovascular system: S1 & S2 heard,  RRR Gastrointestinal system: Abdomen is nondistended, soft and nontender. No organomegaly or masses felt. Normal bowel sounds heard. Central nervous system: Alert and oriented. No focal neurological deficits. Extremities: Symmetric 5 x 5 power. Skin: No rashes, lesions or ulcers Psychiatry: Judgement and insight appear normal. Mood & affect appropriate.   Data Reviewed: I have personally reviewed following labs and imaging studies  CBC:  Recent Labs Lab 02/24/16 1024  WBC 7.9  HGB 13.0  HCT 49.4  MCV 74.6*  PLT 123XX123   Basic Metabolic Panel:  Recent Labs Lab 02/24/16 1024 02/25/16 0256  NA 138 139  K 3.8 4.3  CL 92* 96*    CO2 39* 35*  GLUCOSE 100* 103*  BUN 21* 21*  CREATININE 1.31* 1.30*  CALCIUM 8.7* 8.4*   GFR: Estimated Creatinine Clearance: 65.9 mL/min (by C-G formula based on SCr of 1.3 mg/dL (H)). Liver Function Tests:  Recent Labs Lab 02/24/16 1024  AST 13*  ALT 9*  ALKPHOS 65  BILITOT 0.6  PROT 6.7  ALBUMIN 3.7   No results for input(s): LIPASE, AMYLASE in the last 168 hours. No results for input(s): AMMONIA in the last 168 hours. Coagulation Profile: No results for input(s): INR, PROTIME in the last 168 hours. Cardiac Enzymes:  Recent Labs Lab 02/24/16 1024 02/24/16 1522 02/24/16 2047 02/25/16 0256  TROPONINI 0.08* 0.09* 0.10* 0.08*   BNP (last 3 results) No results for input(s): PROBNP in the last 8760 hours. HbA1C: No results for input(s): HGBA1C in the last 72 hours. CBG: No results for input(s): GLUCAP in the last 168 hours. Lipid Profile: No results for input(s): CHOL, HDL, LDLCALC, TRIG, CHOLHDL, LDLDIRECT in the last 72 hours. Thyroid Function Tests: No results for input(s): TSH, T4TOTAL, FREET4, T3FREE, THYROIDAB in the last 72 hours. Anemia Panel: No results for input(s): VITAMINB12, FOLATE, FERRITIN, TIBC, IRON, RETICCTPCT in the last 72 hours. Sepsis Labs: No results for input(s): PROCALCITON, LATICACIDVEN in the last 168 hours.  No results found for this or any previous visit (from the past 240 hour(s)).   Radiology Studies: Dg Chest 2 View  Result Date: 02/24/2016 CLINICAL DATA:  Shortness of breath with abdominal pain and swelling. EXAM: CHEST  2 VIEW COMPARISON:  11/22/2015 FINDINGS: Artifact overlies the chest. Heart size is at the upper limits of normal. Mediastinal shadows are normal. Upper lungs are clear. Slight prominent interstitial lung markings persist at the bases. No sign of infiltrate, collapse or effusion. No acute bone finding. IMPRESSION: Chronic interstitial lung markings, basilar predominant. No active process. Electronically Signed    By: Nelson Chimes M.D.   On: 02/24/2016 10:49    Scheduled Meds: . aspirin EC  81 mg Oral Daily  . furosemide  60 mg Intravenous BID  . heparin  5,000 Units Subcutaneous Q8H  . ipratropium-albuterol  3 mL Nebulization Q6H  . lisinopril  10 mg Oral Daily  . mometasone-formoterol  2 puff Inhalation BID  . nicotine  21 mg Transdermal Daily  . sodium chloride flush  3 mL Intravenous Q12H  . vitamin B-12  1,000 mcg Oral Daily   Continuous Infusions:   LOS: 1 day   Wayne Brunker, Orpah Melter, MD [facility] Pager [phone]  If 7PM-7AM, please contact night-coverage [email or web address] Password [email or web address] 02/25/2016, 1:27 PM

## 2016-02-25 NOTE — Care Management Note (Signed)
Case Management Note  Patient Details  Name: Brian Parsons MRN: [id number] Date of Birth: [birth year]  Subjective/Objective:                  Pt is from home, lives with his spouse and is ind with ADL's. He wears supplemental oxygen at home PTA and has neb machine. He has PCP, drives himself to appointments and has no difficulty affording or managing medications. He plans to return home with self care at DC.   Action/Plan: No CM needs anticipated.   Expected Discharge Date:    02/25/2016              Expected Discharge Plan:  Home/Self Care  In-House Referral:  NA  Discharge planning Services  CM Consult  Post Acute Care Choice:  NA Choice offered to:  NA  Status of Service:  Completed, signed off   Sherald Barge, RN 02/25/2016, 8:14 AM

## 2016-02-26 LAB — BASIC METABOLIC PANEL
Anion gap: 9 (ref 5–15)
BUN: 18 mg/dL (ref 6–20)
CHLORIDE: 91 mmol/L — AB (ref 101–111)
CO2: 40 mmol/L — AB (ref 22–32)
CREATININE: 1.14 mg/dL (ref 0.61–1.24)
Calcium: 9.2 mg/dL (ref 8.9–10.3)
GFR calc non Af Amer: >60 mL/min (ref >60)
Glucose, Bld: 112 mg/dL — ABNORMAL HIGH (ref 65–99)
POTASSIUM: 4.2 mmol/L (ref 3.5–5.1)
Sodium: 140 mmol/L (ref 135–145)

## 2016-02-26 MED ORDER — IPRATROPIUM-ALBUTEROL 0.5-2.5 (3) MG/3ML IN SOLN
3.0000 mL | Freq: Three times a day (TID) | RESPIRATORY_TRACT | Status: DC
  Administered 2016-02-26 – 2016-02-28 (×9): 3 mL via RESPIRATORY_TRACT
  Filled 2016-02-26 (×8): qty 3

## 2016-02-26 MED ORDER — KETOROLAC TROMETHAMINE 30 MG/ML IJ SOLN
30.0000 mg | Freq: Once | INTRAMUSCULAR | Status: AC
  Administered 2016-02-26: 30 mg via INTRAVENOUS
  Filled 2016-02-26: qty 1

## 2016-02-26 NOTE — Progress Notes (Signed)
PROGRESS NOTE    Brian Parsons  [id number] DOB: [birth year] DOA: 02/24/2016 PCP: [name], PA-C    Brief Narrative:  65 y.o. male with medical history significant of CHF, COPD, HTN, polycythemia, CAD who presents to the ED with sob, recent weight gain, 3 pounds per pt. Reports being compliant with medications. Reports recently being transitioned to torsemide. Despite diuretics, patient reported swelling continued to worsen. Reports orthopnea, denies chest pain  Assessment & Plan:   Principal Problem:   CHF (congestive heart failure) ([facility]) Active Problems:   COPD (chronic obstructive pulmonary disease) ([facility])   Elevated troponin   Acute diastolic CHF (congestive heart failure) ([facility])   Tobacco abuse   Chronic diastolic heart failure ([facility])   Essential hypertension   Polycythemia, secondary  1.  Acute diastolic CHF exacerbation 1. Patient presented with increased lower extremity edema and subjective weight gain 2. Dry weight noted to be around 108 kg 3. Weight at admission 113.3 kg 4. Weight today 111.7 kg 5. most recent 2-D echocardiogram from April 2017 reveals EF of 123456, LV diastolic function appeared normal on that study. 6. Patient is continued on 60 mg IV twice a day Lasix 7. Patient continues to report improvement. 8. Continue to monitor I's and O's as well as daily weights 9. Renal function thus far stable. Will repeat basic metabolic panel morning 2. COPD 1. Trace and expiratory wheezing auscultated on exam today 2. Patient remains on room air at present. 3. Patient has reported improvement with scheduled DuoNeb's overnight 4. We'll continue bronchodilators as needed 3. Elevated troponin 1. Presenting Trop of 0.08, peak troponin to 0.1, trended back to 0.08 at last check 2. Patient continues without chest discomfort 3. Suspect troponin leak in the setting of acutely decompensated diastolic heart failure 4. Tobacco abuse 1. Cessation done at the time of  admission. 2. Continue nicotine patch 5. HTN 1. Blood pressure stable, relatively controlled 2. We'll continue current regimen for the time being 6. Pylycythemia 1. Recent labs reviewed. Hemoglobin stable.  DVT prophylaxis: Heparin subcutaneous Code Status: Full code Family Communication: Patient in room, family not at bedside Disposition Plan: Uncertain at this time  Consultants:     Procedures:     Antimicrobials: Anti-infectives    None      Subjective: Eager to go home. No complaints  Objective: Vitals:   02/25/16 2130 02/26/16 0139 02/26/16 0532 02/26/16 0850  BP: 123/69  (!) 125/93   Pulse: 95  (!) 104   Resp: 20  20   Temp: 98.5 F (36.9 C)  98.4 F (36.9 C)   TempSrc: Oral  Oral   SpO2: 95% 96% 99% (!) 89%  Weight:   111.7 kg (246 lb 4.8 oz)   Height:        Intake/Output Summary (Last 24 hours) at 02/26/16 1350 Last data filed at 02/25/16 2100  Gross per 24 hour  Intake              240 ml  Output              500 ml  Net             -260 ml   Filed Weights   02/24/16 1504 02/25/16 0446 02/26/16 0532  Weight: 113.4 kg (249 lb 14.4 oz) 112.1 kg (247 lb 3.2 oz) 111.7 kg (246 lb 4.8 oz)    Examination:  General exam: Sitting at bedside, no distress Respiratory system: Normal respiratory effort, end expiratory wheezing auscultated bilaterally  Cardiovascular system: Regular rate, S1-S2 Gastrointestinal system: Positive bowel sounds, obese, nontender. Central nervous system: No seizures, no tremors Extremities: Perfused, no clubbing. Skin: Normal skin turgor, no notable skin lesions seen Psychiatry: Mood normal, no visual hallucinations  Data Reviewed: I have personally reviewed following labs and imaging studies  CBC:  Recent Labs Lab 02/24/16 1024  WBC 7.9  HGB 13.0  HCT 49.4  MCV 74.6*  PLT 123XX123   Basic Metabolic Panel:  Recent Labs Lab 02/24/16 1024 02/25/16 0256 02/26/16 0610  NA 138 139 140  K 3.8 4.3 4.2  CL 92* 96*  91*  CO2 39* 35* 40*  GLUCOSE 100* 103* 112*  BUN 21* 21* 18  CREATININE 1.31* 1.30* 1.14  CALCIUM 8.7* 8.4* 9.2   GFR: Estimated Creatinine Clearance: 75 mL/min (by C-G formula based on SCr of 1.14 mg/dL). Liver Function Tests:  Recent Labs Lab 02/24/16 1024  AST 13*  ALT 9*  ALKPHOS 65  BILITOT 0.6  PROT 6.7  ALBUMIN 3.7   No results for input(s): LIPASE, AMYLASE in the last 168 hours. No results for input(s): AMMONIA in the last 168 hours. Coagulation Profile: No results for input(s): INR, PROTIME in the last 168 hours. Cardiac Enzymes:  Recent Labs Lab 02/24/16 1024 02/24/16 1522 02/24/16 2047 02/25/16 0256  TROPONINI 0.08* 0.09* 0.10* 0.08*   BNP (last 3 results) No results for input(s): PROBNP in the last 8760 hours. HbA1C: No results for input(s): HGBA1C in the last 72 hours. CBG: No results for input(s): GLUCAP in the last 168 hours. Lipid Profile: No results for input(s): CHOL, HDL, LDLCALC, TRIG, CHOLHDL, LDLDIRECT in the last 72 hours. Thyroid Function Tests: No results for input(s): TSH, T4TOTAL, FREET4, T3FREE, THYROIDAB in the last 72 hours. Anemia Panel: No results for input(s): VITAMINB12, FOLATE, FERRITIN, TIBC, IRON, RETICCTPCT in the last 72 hours. Sepsis Labs: No results for input(s): PROCALCITON, LATICACIDVEN in the last 168 hours.  No results found for this or any previous visit (from the past 240 hour(s)).   Radiology Studies: No results found.  Scheduled Meds: . aspirin EC  81 mg Oral Daily  . furosemide  60 mg Intravenous BID  . heparin  5,000 Units Subcutaneous Q8H  . ipratropium-albuterol  3 mL Nebulization TID  . lisinopril  10 mg Oral Daily  . mometasone-formoterol  2 puff Inhalation BID  . nicotine  21 mg Transdermal Daily  . sodium chloride flush  3 mL Intravenous Q12H  . vitamin B-12  1,000 mcg Oral Daily   Continuous Infusions:   LOS: 2 days   Skylie Hiott, Orpah Melter, MD [facility] Pager [phone]  If  7PM-7AM, please contact night-coverage [email or web address] Password [email or web address] 02/26/2016, 1:50 PM

## 2016-02-27 ENCOUNTER — Inpatient Hospital Stay (HOSPITAL_COMMUNITY): Payer: Medicare Other

## 2016-02-27 DIAGNOSIS — Z720 Tobacco use: Secondary | ICD-10-CM

## 2016-02-27 LAB — BASIC METABOLIC PANEL
ANION GAP: 8 (ref 5–15)
BUN: 21 mg/dL — AB (ref 6–20)
CHLORIDE: 92 mmol/L — AB (ref 101–111)
CO2: 40 mmol/L — ABNORMAL HIGH (ref 22–32)
Calcium: 9.3 mg/dL (ref 8.9–10.3)
Creatinine, Ser: 1.14 mg/dL (ref 0.61–1.24)
GFR calc Af Amer: >60 mL/min (ref >60)
GLUCOSE: 109 mg/dL — AB (ref 65–99)
POTASSIUM: 4.2 mmol/L (ref 3.5–5.1)
SODIUM: 140 mmol/L (ref 135–145)

## 2016-02-27 MED ORDER — METHYLPREDNISOLONE SODIUM SUCC 40 MG IJ SOLR
40.0000 mg | Freq: Three times a day (TID) | INTRAMUSCULAR | Status: DC
  Administered 2016-02-27 – 2016-02-28 (×3): 40 mg via INTRAVENOUS
  Filled 2016-02-27 (×4): qty 1

## 2016-02-27 MED ORDER — SALINE SPRAY 0.65 % NA SOLN: 1.0000 | NASAL | Status: DC | PRN

## 2016-02-27 MED ORDER — FUROSEMIDE 10 MG/ML IJ SOLN
20.0000 mg | Freq: Once | INTRAMUSCULAR | Status: AC
  Administered 2016-02-27: 20 mg via INTRAVENOUS
  Filled 2016-02-27: qty 2

## 2016-02-27 MED ORDER — PREDNISONE 20 MG PO TABS
60.0000 mg | ORAL_TABLET | Freq: Every day | ORAL | Status: DC
  Administered 2016-02-27: 60 mg via ORAL
  Filled 2016-02-27: qty 3

## 2016-02-27 NOTE — Progress Notes (Signed)
PROGRESS NOTE    Brian Parsons  [id number] DOB: [birth year] DOA: 02/24/2016 PCP: [name], PA-C    Brief Narrative:  65 y.o. male with medical history significant of CHF, COPD, HTN, polycythemia, CAD who presents to the ED with sob, recent weight gain, 3 pounds per pt. Reports being compliant with medications. Reports recently being transitioned to torsemide. Despite diuretics, patient reported swelling continued to worsen. Reports orthopnea, denies chest pain  Assessment & Plan:   Principal Problem:   CHF (congestive heart failure) (HCC) Active Problems:   COPD (chronic obstructive pulmonary disease) (HCC)   Elevated troponin   Acute diastolic CHF (congestive heart failure) (HCC)   Tobacco abuse   Chronic diastolic heart failure (HCC)   Essential hypertension   Polycythemia, secondary  1.  Acute diastolic CHF exacerbation 1. Patient presented with increased lower extremity edema and subjective weight gain 2. Dry weight noted to be around 108 kg 3. Weight at admission 113.3 kg 4. Weight today 112.1 kg 5. most recent 2-D echocardiogram from April 2017 reveals EF of 123456, LV diastolic function appeared normal on that study. 6. Patient had been continued on 60 mg IV twice a day Lasix 7. Patient continues to report clinical improvement. 8. Continue to monitor I's and O's as well as daily weights 9. Still hypoxic requiring supplemental O2 10. Will give an additional 20mg  lasix for total of 80mg  given this AM 11. Will check CXR 12. Repeat BMET in AM 2. COPD 1. End-expiratory wheezing on exam 2. Patient remains O2 dependent 3. Continue bronchodilators 4. Will add prednisone 3. Elevated troponin 1. Presenting Trop of 0.08, peak troponin to 0.1, trended back to 0.08 at last check 2. Remains asymptomatic 3. Suspect troponin leak in the setting of acutely decompensated diastolic heart failure 4. Tobacco abuse 1. Cessation was done at the time of admission. 2. Patient  is continued on nicotine patch 5. HTN 1. BP remains stable 2. Cont current meds 6. Pylycythemia 1. CBC from 11/19  reviewed.  2. Hemoglobin remained stable.  DVT prophylaxis: Heparin subcutaneous Code Status: Full code Family Communication: Patient in room, family not at bedside Disposition Plan: Uncertain at this time  Consultants:     Procedures:     Antimicrobials: Anti-infectives    None      Subjective: Reports feeling better. Asking about going home  Objective: Vitals:   02/26/16 2100 02/27/16 0534 02/27/16 0815 02/27/16 1359  BP: 125/69 134/84    Pulse: 97 100    Resp: 16 16    Temp: 98 F (36.7 C) 98.3 F (36.8 C)    TempSrc: Oral Oral    SpO2: 94%  (!) 70% (!) 78%  Weight:  112.1 kg (247 lb 3.2 oz)    Height:        Intake/Output Summary (Last 24 hours) at 02/27/16 1508 Last data filed at 02/27/16 1408  Gross per 24 hour  Intake              483 ml  Output             2425 ml  Net            -1942 ml   Filed Weights   02/25/16 0446 02/26/16 0532 02/27/16 0534  Weight: 112.1 kg (247 lb 3.2 oz) 111.7 kg (246 lb 4.8 oz) 112.1 kg (247 lb 3.2 oz)    Examination:  General exam: Sitting in bed, conversing, no distress Respiratory system: No chest rise, decreased breath sounds,  and expiratory wheezing auscultated Cardiovascular system: Regular rhythm, S1-S2 Gastrointestinal system: Obese, no masses, positive bowel sounds Central nervous system: CN II through XII grossly intact, sensation intact Extremities: No cyanosis, no joint deformities Skin: No cyanosis, normal skin turgor Psychiatry: Affect normal, no auditory hallucinations  Data Reviewed: I have personally reviewed following labs and imaging studies  CBC:  Recent Labs Lab 02/24/16 1024  WBC 7.9  HGB 13.0  HCT 49.4  MCV 74.6*  PLT 123XX123   Basic Metabolic Panel:  Recent Labs Lab 02/24/16 1024 02/25/16 0256 02/26/16 0610 02/27/16 0721  NA 138 139 140 140  K 3.8 4.3 4.2 4.2    CL 92* 96* 91* 92*  CO2 39* 35* 40* 40*  GLUCOSE 100* 103* 112* 109*  BUN 21* 21* 18 21*  CREATININE 1.31* 1.30* 1.14 1.14  CALCIUM 8.7* 8.4* 9.2 9.3   GFR: Estimated Creatinine Clearance: 75.2 mL/min (by C-G formula based on SCr of 1.14 mg/dL). Liver Function Tests:  Recent Labs Lab 02/24/16 1024  AST 13*  ALT 9*  ALKPHOS 65  BILITOT 0.6  PROT 6.7  ALBUMIN 3.7   No results for input(s): LIPASE, AMYLASE in the last 168 hours. No results for input(s): AMMONIA in the last 168 hours. Coagulation Profile: No results for input(s): INR, PROTIME in the last 168 hours. Cardiac Enzymes:  Recent Labs Lab 02/24/16 1024 02/24/16 1522 02/24/16 2047 02/25/16 0256  TROPONINI 0.08* 0.09* 0.10* 0.08*   BNP (last 3 results) No results for input(s): PROBNP in the last 8760 hours. HbA1C: No results for input(s): HGBA1C in the last 72 hours. CBG: No results for input(s): GLUCAP in the last 168 hours. Lipid Profile: No results for input(s): CHOL, HDL, LDLCALC, TRIG, CHOLHDL, LDLDIRECT in the last 72 hours. Thyroid Function Tests: No results for input(s): TSH, T4TOTAL, FREET4, T3FREE, THYROIDAB in the last 72 hours. Anemia Panel: No results for input(s): VITAMINB12, FOLATE, FERRITIN, TIBC, IRON, RETICCTPCT in the last 72 hours. Sepsis Labs: No results for input(s): PROCALCITON, LATICACIDVEN in the last 168 hours.  No results found for this or any previous visit (from the past 240 hour(s)).   Radiology Studies: No results found.  Scheduled Meds: . aspirin EC  81 mg Oral Daily  . furosemide  60 mg Intravenous BID  . heparin  5,000 Units Subcutaneous Q8H  . ipratropium-albuterol  3 mL Nebulization TID  . lisinopril  10 mg Oral Daily  . mometasone-formoterol  2 puff Inhalation BID  . nicotine  21 mg Transdermal Daily  . predniSONE  60 mg Oral Q breakfast  . sodium chloride flush  3 mL Intravenous Q12H  . vitamin B-12  1,000 mcg Oral Daily   Continuous Infusions:   LOS: 3  days   CHIU, Orpah Melter, MD [facility] Pager [phone]  If 7PM-7AM, please contact night-coverage [email or web address] Password [email or web address] 02/27/2016, 3:08 PM

## 2016-02-28 DIAGNOSIS — I5031 Acute diastolic (congestive) heart failure: Secondary | ICD-10-CM

## 2016-02-28 LAB — BASIC METABOLIC PANEL
Anion gap: 10 (ref 5–15)
BUN: 23 mg/dL — AB (ref 6–20)
CHLORIDE: 88 mmol/L — AB (ref 101–111)
CO2: 42 mmol/L — AB (ref 22–32)
Calcium: 9.6 mg/dL (ref 8.9–10.3)
Creatinine, Ser: 1.16 mg/dL (ref 0.61–1.24)
GFR calc Af Amer: >60 mL/min (ref >60)
GFR calc non Af Amer: >60 mL/min (ref >60)
Glucose, Bld: 130 mg/dL — ABNORMAL HIGH (ref 65–99)
Potassium: 4.2 mmol/L (ref 3.5–5.1)
SODIUM: 140 mmol/L (ref 135–145)

## 2016-02-28 MED ORDER — PREDNISONE 5 MG PO TABS: 5.0000 mg | ORAL_TABLET | Freq: Every day | ORAL | Status: DC

## 2016-02-28 NOTE — Care Management Important Message (Signed)
Important Message  Patient Details  Name: Brian Parsons MRN: [id number] Date of Birth: September 21, 1950   Medicare Important Message Given:  Yes    Sherald Barge, RN 02/28/2016, 1:43 PM

## 2016-02-28 NOTE — Care Management Note (Signed)
Case Management Note  Patient Details  Name: Brian Parsons MRN: [id number] Date of Birth: 1950-07-26  Expected Discharge Date:      02/28/2016            Expected Discharge Plan:  Home/Self Care  In-House Referral:  NA  Discharge planning Services  CM Consult  Post Acute Care Choice:  Durable Medical Equipment Choice offered to:  Patient  DME Arranged:  Oxygen DME Agency:  [facility]  Status of Service:  Completed, signed off    Additional Comments: Pt discharging home today with self care. After clarifying with pt, he has oxygen concentrator at home PTA. He used to have continous oxygen with port tanks but felt like he didn't need them and felt uneasy with extra tanks sitting around his home. He understands he now needs supplemental oxygen 24/7 and is agreeable to have port tanks delivered. Mandy from [facility], aware of need, will obtain pt info from chart and have port tanks delivered to pt room and home. No further CM needs.   Sherald Barge, RN 02/28/2016, 1:44 PM

## 2016-02-28 NOTE — Progress Notes (Signed)
SATURATION QUALIFICATIONS: (This note is used to comply with regulatory documentation for home oxygen)   Patient Saturations on 2 Liters of oxygen while Ambulating = 86%  Please briefly explain why patient needs home oxygen: Pt has been on 4L continuously during admission. Ambulating with 2 liters with destat. Ambulating with 3 L with O2 sat at 93% and 4L with 95%.

## 2016-02-28 NOTE — Progress Notes (Signed)
Discharged PT per MD order and protocol. Discharge handouts reviewed/explained. Education completed.  Pt verbalized understanding and left with all belongings. VSS. IV catheter D/C.  Patient wheeled down by staff member.  

## 2016-02-28 NOTE — Discharge Summary (Addendum)
Physician Discharge Summary  Brian Parsons [id number] DOB: [birth year] DOA: 02/24/2016  PCP: [name], PA-C  Admit date: 02/24/2016 Discharge date: 02/28/2016  Admitted From: Home Disposition:  Home  Recommendations for Outpatient Follow-up:  1. Follow up with PCP in 1-2 weeks 2. Please obtain BMP/CBC in one week 3. Follow up with Cardiology as scheduled 4. Recommend outpatient sleep study  Discharge Condition:Improved CODE STATUS:Full Diet recommendation: Heart healthy   Brief/Interim Summary: Please see dictated H and P from 02/24/16 for details. Briefly, 65 y.o.malewith medical history significant of CHF, COPD, HTN, polycythemia, CAD who presents to the ED with sob, recent weight gain, 3 pounds per pt. Reports being compliant with medications. Reports recently being transitioned to torsemide. Despite diuretics, patient reported swelling continued to worsen. Reports orthopnea, denies chest pain  1. Acute on chronic diastolic CHF exacerbation 1. Patient presented with increased lower extremity edema and subjective weight gain 2. Dry weight assumed to be around 108 kg per prior vitals 3. Weight at admission 113.3 kg 4. Weight today 111.4 kg 5. most recent 2-D echocardiogram from April 2017 reveals EF of 123456, LV diastolic function appeared normal on that study. 6. Patient had been continued on 60 mg IV twice a day Lasix 7. LE edema improved and patient improved clinically 8. Still hypoxic requiring supplemental O2 9. CXR without florid edema. Suspect chronic O2 dependency. Patient had admitted to not being compliant with home O2 in the past 10. On BMET, serum bicarb rose to a peak of 42, suggesting developing contraction alkalosis, thus continued IV lasix stopped 11. Recommend continued torsemide as recommended by patient's primary Cardiologist 12. Recommend repeat BMET in 1 week and close outpatient follow up 2. COPD 1. End-expiratory wheezing on exam 2. Patient  remains O2 dependent 3. Continude bronchodilators 4. Added steroids 5. Patient remains O2 dependent 3. Elevated troponin 1. Presenting Trop of 0.08, peak troponin to 0.1, trended back to 0.08 at last check 2. Remains asymptomatic without chest pain 3. Suspect troponin leak in the setting of acutely decompensated diastolic heart failure 4. Tobacco abuse 1. Cessation was done at the time of admission. 2. Patient is continued on nicotine patch 5. HTN 1. BP remains stable 2. Cont current meds 6. Polycythemia 1. CBC from 11/19  reviewed.  2. Hemoglobin remained stable.  Discharge Diagnoses:  Principal Problem:   CHF (congestive heart failure) (HCC) Active Problems:   COPD (chronic obstructive pulmonary disease) (HCC)   Elevated troponin   Acute diastolic CHF (congestive heart failure) (HCC)   Tobacco abuse   Chronic diastolic heart failure (HCC)   Essential hypertension   Polycythemia, secondary    Discharge Instructions     Medication List    TAKE these medications   ADVAIR DISKUS 250-50 MCG/DOSE Aepb Generic drug:  Fluticasone-Salmeterol USE 1 INHALATION BY MOUTH EVERY 12 HOURS. <<RINSE MOUTH AFTER USE>>.   aspirin EC 81 MG tablet Take 81 mg by mouth daily.   GOODY HEADACHE PO Take 1 Package by mouth daily as needed (headache).   ipratropium-albuterol 0.5-2.5 (3) MG/3ML Soln Commonly known as:  DUONEB Take 3 mLs by nebulization every 6 (six) hours. What changed:  when to take this  reasons to take this   lisinopril 20 MG tablet Commonly known as:  PRINIVIL Take 0.5 tablets (10 mg total) by mouth daily.   nicotine 21 mg/24hr patch Commonly known as:  NICODERM CQ Place 1 patch (21 mg total) onto the skin daily.   potassium chloride 10 MEQ tablet Commonly  known as:  K-DUR,KLOR-CON Take 10 mEq by mouth daily.   predniSONE 5 MG tablet Commonly known as:  DELTASONE Take 1 tablet (5 mg total) by mouth daily with breakfast.   torsemide 20 MG  tablet Commonly known as:  DEMADEX Take 4 tablets (80 mg total) by mouth daily. What changed:  how much to take   vitamin B-12 1000 MCG tablet Commonly known as:  CYANOCOBALAMIN Take 1,000 mcg by mouth daily.            Durable Medical Equipment        Start     Ordered   02/28/16 1126  For home use only DME oxygen  Once    Question Answer Comment  Mode or (Route) Nasal cannula   Liters per Minute 3   Frequency Continuous (stationary and portable oxygen unit needed)   Oxygen conserving device Yes   Oxygen delivery system Gas      02/28/16 1125     Follow-up Information    BAUCOM, JENNY B, PA-C. Schedule an appointment as soon as possible for a visit in 2 week(s).   Specialty:  Physician Assistant Contact information: [address] [place] [address] [phone]        Creta Levin, MD. Schedule an appointment as soon as possible for a visit.   Specialty:  Cardiology Contact information: [id number] [place] [address] [phone]          Allergies  Allergen Reactions  . Amoxicillin Hives  . Penicillins Hives    Has patient had a PCN reaction causing immediate rash, facial/tongue/throat swelling, SOB or lightheadedness with hypotension: Unknown Has patient had a PCN reaction causing severe rash involving mucus membranes or skin necrosis: No Has patient had a PCN reaction that required hospitalization No Has patient had a PCN reaction occurring within the last 10 years: No If all of the above answers are "NO", then may proceed with Cephalosporin use.      Procedures/Studies: Dg Chest 2 View  Result Date: 02/24/2016 CLINICAL DATA:  Shortness of breath with abdominal pain and swelling. EXAM: CHEST  2 VIEW COMPARISON:  11/22/2015 FINDINGS: Artifact overlies the chest. Heart size is at the upper limits of normal. Mediastinal shadows are normal. Upper lungs are clear. Slight prominent interstitial lung markings  persist at the bases. No sign of infiltrate, collapse or effusion. No acute bone finding. IMPRESSION: Chronic interstitial lung markings, basilar predominant. No active process. Electronically Signed   By: Nelson Chimes M.D.   On: 02/24/2016 10:49   Dg Chest Port 1 View  Result Date: 02/27/2016 CLINICAL DATA:  Hypoxia and CHF EXAM: PORTABLE CHEST 1 VIEW COMPARISON:  None. FINDINGS: Stable cardiomegaly. The hila and mediastinum are unchanged. Mild increased opacity in the left base, likely atelectasis. No pneumothorax. No overt edema. IMPRESSION: Mild left basilar opacity, likely atelectasis. No other interval changes. Electronically Signed   By: Dorise Bullion III M.D   On: 02/27/2016 15:05    Subjective: Eager to go home  Discharge Exam: Vitals:   02/28/16 0517 02/28/16 1437  BP: 125/75 115/90  Pulse: 95 81  Resp: 20 (!) 22  Temp: 98.2 F (36.8 C) (!) 95.6 F (35.3 C)   Vitals:   02/28/16 0713 02/28/16 0716 02/28/16 1409 02/28/16 1437  BP:    115/90  Pulse:    81  Resp:    (!) 22  Temp:    (!) 95.6 F (35.3 C)  TempSrc:    Oral  SpO2: 92% 92% 91% 98%  Weight:      Height:        General: Pt is alert, awake, not in acute distress Cardiovascular: RRR, S1/S2 +, no rubs, no gallops Respiratory: CTA bilaterally, no wheezing, no rhonchi Abdominal: Soft, NT, ND, bowel sounds + Extremities: no edema, no cyanosis   The results of significant diagnostics from this hospitalization (including imaging, microbiology, ancillary and laboratory) are listed below for reference.     Microbiology: No results found for this or any previous visit (from the past 240 hour(s)).   Labs: BNP (last 3 results)  Recent Labs  07/28/15 1308 02/24/16 1024  BNP 73.0 [id number]*   Basic Metabolic Panel:  Recent Labs Lab 02/24/16 1024 02/25/16 0256 02/26/16 0610 02/27/16 0721 02/28/16 0645  NA 138 139 140 140 140  K 3.8 4.3 4.2 4.2 4.2  CL 92* 96* 91* 92* 88*  CO2 39* 35* 40* 40* 42*   GLUCOSE 100* 103* 112* 109* 130*  BUN 21* 21* 18 21* 23*  CREATININE 1.31* 1.30* 1.14 1.14 1.16  CALCIUM 8.7* 8.4* 9.2 9.3 9.6   Liver Function Tests:  Recent Labs Lab 02/24/16 1024  AST 13*  ALT 9*  ALKPHOS 65  BILITOT 0.6  PROT 6.7  ALBUMIN 3.7   No results for input(s): LIPASE, AMYLASE in the last 168 hours. No results for input(s): AMMONIA in the last 168 hours. CBC:  Recent Labs Lab 02/24/16 1024  WBC 7.9  HGB 13.0  HCT 49.4  MCV 74.6*  PLT 208   Cardiac Enzymes:  Recent Labs Lab 02/24/16 1024 02/24/16 1522 02/24/16 2047 02/25/16 0256  TROPONINI 0.08* 0.09* 0.10* 0.08*   BNP: Invalid input(s): POCBNP CBG: No results for input(s): GLUCAP in the last 168 hours. D-Dimer No results for input(s): DDIMER in the last 72 hours. Hgb A1c No results for input(s): HGBA1C in the last 72 hours. Lipid Profile No results for input(s): CHOL, HDL, LDLCALC, TRIG, CHOLHDL, LDLDIRECT in the last 72 hours. Thyroid function studies No results for input(s): TSH, T4TOTAL, T3FREE, THYROIDAB in the last 72 hours.  Invalid input(s): FREET3 Anemia work up No results for input(s): VITAMINB12, FOLATE, FERRITIN, TIBC, IRON, RETICCTPCT in the last 72 hours. Urinalysis    Component Value Date/Time   COLORURINE YELLOW 11/22/2015 1403   APPEARANCEUR CLEAR 11/22/2015 1403   LABSPEC 1.010 11/22/2015 1403   PHURINE 5.5 11/22/2015 1403   GLUCOSEU NEGATIVE 11/22/2015 1403   HGBUR NEGATIVE 11/22/2015 1403   BILIRUBINUR NEGATIVE 11/22/2015 1403   KETONESUR NEGATIVE 11/22/2015 1403   PROTEINUR NEGATIVE 11/22/2015 1403   NITRITE NEGATIVE 11/22/2015 1403   LEUKOCYTESUR NEGATIVE 11/22/2015 1403   Sepsis Labs Invalid input(s): PROCALCITONIN,  WBC,  LACTICIDVEN Microbiology No results found for this or any previous visit (from the past 240 hour(s)).   SIGNED:   Donne Hazel, MD  [facility] 02/28/2016, 2:40 PM  If 7PM-7AM, please contact  night-coverage [email or web address] Password [email or web address]

## 2016-03-15 ENCOUNTER — Ambulatory Visit (HOSPITAL_COMMUNITY): Payer: Medicare Other | Admitting: Oncology

## 2016-03-15 ENCOUNTER — Other Ambulatory Visit (HOSPITAL_COMMUNITY): Payer: Medicare Other

## 2016-03-30 ENCOUNTER — Encounter (HOSPITAL_COMMUNITY): Payer: Medicare Other

## 2016-03-30 ENCOUNTER — Other Ambulatory Visit (HOSPITAL_COMMUNITY): Payer: Self-pay | Admitting: Oncology

## 2016-03-30 ENCOUNTER — Encounter (HOSPITAL_COMMUNITY): Payer: Self-pay | Admitting: Oncology

## 2016-03-30 ENCOUNTER — Other Ambulatory Visit (HOSPITAL_COMMUNITY): Payer: Medicare Other

## 2016-03-30 ENCOUNTER — Encounter (HOSPITAL_COMMUNITY): Payer: Medicare Other | Attending: Oncology | Admitting: Oncology

## 2016-03-30 VITALS — BP 115/74 | HR 107 | Temp 98.4°F | Resp 20 | Ht 67.5 in | Wt 243.0 lb

## 2016-03-30 DIAGNOSIS — R0902 Hypoxemia: Secondary | ICD-10-CM | POA: Diagnosis not present

## 2016-03-30 DIAGNOSIS — D751 Secondary polycythemia: Secondary | ICD-10-CM | POA: Diagnosis present

## 2016-03-30 DIAGNOSIS — J449 Chronic obstructive pulmonary disease, unspecified: Secondary | ICD-10-CM | POA: Diagnosis not present

## 2016-03-30 DIAGNOSIS — J00 Acute nasopharyngitis [common cold]: Secondary | ICD-10-CM

## 2016-03-30 DIAGNOSIS — F17210 Nicotine dependence, cigarettes, uncomplicated: Secondary | ICD-10-CM

## 2016-03-30 DIAGNOSIS — Z716 Tobacco abuse counseling: Secondary | ICD-10-CM

## 2016-03-30 LAB — CBC
HEMATOCRIT: 51.9 % (ref 39.0–52.0)
Hemoglobin: 13.7 g/dL (ref 13.0–17.0)
MCH: 20.3 pg — AB (ref 26.0–34.0)
MCHC: 26.4 g/dL — AB (ref 30.0–36.0)
MCV: 77 fL — AB (ref 78.0–100.0)
Platelets: 165 10*3/uL (ref 150–400)
RBC: 6.74 MIL/uL — ABNORMAL HIGH (ref 4.22–5.81)
RDW: 21.3 % — ABNORMAL HIGH (ref 11.5–15.5)
WBC: 7.4 10*3/uL (ref 4.0–10.5)

## 2016-03-30 LAB — FERRITIN: Ferritin: 8 ng/mL — ABNORMAL LOW (ref 24–336)

## 2016-03-30 MED ORDER — AZITHROMYCIN 250 MG PO TABS: ORAL_TABLET | ORAL | 0 refills | Status: DC

## 2016-03-30 NOTE — Patient Instructions (Signed)
[facility] Discharge Instructions  RECOMMENDATIONS MADE BY THE CONSULTANT AND ANY TEST RESULTS WILL BE SENT TO YOUR REFERRING PHYSICIAN.  You were seen today by Kirby Crigler PA-C. Labs today, will call with results. Z-pak sent to your pharmacy. Labs and follow up in 3 months.  Thank you for choosing [facility] to provide your oncology and hematology care.  To afford each patient quality time with our provider, please arrive at least 15 minutes before your scheduled appointment time.   Beginning January 23rd 2017 lab work for the [facility] will be done in the  Main lab at [facility] on 1st floor. If you have a lab appointment with the [facility] please come in thru the  Main Entrance and check in at the main information desk  You need to re-schedule your appointment should you arrive 10 or more minutes late.  We strive to give you quality time with our providers, and arriving late affects you and other patients whose appointments are after yours.  Also, if you no show three or more times for appointments you may be dismissed from the clinic at the providers discretion.     Again, thank you for choosing [facility].  Our hope is that these requests will decrease the amount of time that you wait before being seen by our physicians.       _____________________________________________________________  Should you have questions after your visit to [facility], please contact our office at [phone] between the hours of 8:30 a.m. and 4:30 p.m.  Voicemails left after 4:30 p.m. will not be returned until the following business day.  For prescription refill requests, have your pharmacy contact our office.         Resources For Cancer Patients and their Caregivers ? American Cancer Society: Can assist with transportation, wigs, general needs, runs Look Good Feel Better.         681-408-2678 ? Cancer Care: Provides financial assistance, online support groups, medication/co-pay assistance.  1-800-813-HOPE (814)768-7817) ? [facility] Assists [place] cancer patients and their families through emotional , educational and financial support.  (218)129-3550 ? [place] DSS Where to apply for food stamps, Medicaid and utility assistance. (606) 883-4064 ? [facility]: Transportation to medical appointments. 204-457-7065 ? Social Security Administration: May apply for disability if have a Stage IV cancer. (609)873-1581 684-487-8163 ? [employer], Disability and Transit Services: Assists with nutrition, care and transit needs. [facility] Support Programs: @10RELATIVEDAYS @ > Cancer Support Group  2nd Tuesday of the month 1pm-2pm, Journey Room  > Creative Journey  3rd Tuesday of the month 1130am-1pm, Journey Room  > Look Good Feel Better  1st Wednesday of the month 10am-12 noon, Journey Room (Call [facility] to register [phone])

## 2016-03-30 NOTE — Addendum Note (Signed)
Addended by: Farley Ly on: 03/30/2016 12:09 PM   Modules accepted: Orders

## 2016-03-30 NOTE — Progress Notes (Signed)
[name], PA-C [address] [place] [address]  Polycythemia, secondary - Plan: CBC with Differential, Basic metabolic panel, Ferritin, CBC, Ferritin  Acute nasopharyngitis - Plan: azithromycin (ZITHROMAX) 250 MG tablet  CURRENT THERAPY: Therapeutic phlebotomy to maintain HCT 50% or less and smoking cessation education.  INTERVAL HISTORY: Brian Parsons 65 y.o. 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  JAK/MPL/CALR mutations negative.  He is doing well.  He reports a nice Thanksgiving.  He reminds me that he was in the hospital for CHF.  He feels much improved since discharge.  He reports a cough x 3-4 days productive of green sputum.  He denies any fevers, sore throat, sinus pressure, headaches, or other symptoms of URI.  Review of Systems  Constitutional: Negative.  Negative for chills, fever and weight loss.  HENT: Negative.  Negative for tinnitus.   Eyes: Negative.  Negative for blurred vision.  Respiratory: Positive for cough and sputum production. Negative for shortness of breath.   Cardiovascular: Negative.  Negative for chest pain.  Gastrointestinal: Negative.  Negative for constipation, diarrhea, heartburn, nausea and vomiting.  Genitourinary: Negative.   Musculoskeletal: Negative.   Skin: Negative.  Negative for rash.  Neurological: Negative.  Negative for weakness and headaches.  Endo/Heme/Allergies: Negative.   Psychiatric/Behavioral: Negative.     Past Medical History:  Diagnosis Date  . Bilateral leg edema   . CHF (congestive heart failure) ([place])   . COPD (chronic obstructive pulmonary disease) ([place])   . Essential hypertension   . Polycythemia     Past Surgical History:  Procedure Laterality Date  . CARDIAC CATHETERIZATION N/A 08/10/2015   Procedure: Left Heart Cath and Coronary Angiography;  Surgeon: Troy Sine, MD;  Location: [facility];  Service:  Cardiovascular;  Laterality: N/A;    Family History  Problem Relation Age of Onset  . Stroke Mother   . Emphysema Father     Social History   Social History  . Marital status: Divorced    Spouse name: N/A  . Number of children: N/A  . Years of education: N/A   Social History Main Topics  . Smoking status: Current Some Day Smoker    Packs/day: 0.50    Years: 50.00    Types: Cigarettes    Start date: 04/17/1961  . Smokeless tobacco: Never Used     Comment:  5 ciggs per week / every other day   . Alcohol use No     Comment: No EtOH for 7 years  . Drug use: No  . Sexual activity: Yes   Other Topics Concern  . Not on file   Social History Narrative  . No narrative on file     PHYSICAL EXAMINATION  ECOG PERFORMANCE STATUS: 1 - Symptomatic but completely ambulatory  There were no vitals filed for this visit.  BP 115/74 P 107 R 20 T 98.4 O2 93% on RA  GENERAL:alert, no distress, well nourished, well developed, comfortable, cooperative, obese, smiling and unaccompanied and erythematous face SKIN: no rashes or significant lesions, positive for: Erythematous face HEAD: Normocephalic, No masses, lesions, tenderness or abnormalities EYES: normal, EOMI, Conjunctiva are pink and non-injected EARS: External ears normal OROPHARYNX:lips, buccal mucosa, and tongue normal and mucous membranes are moist.  No posterior pharynx erythema NECK: supple, trachea midline LYMPH:  not examined BREAST:not examined LUNGS: clear to auscultation and percussion, decreased breath sounds.  Expiratory wheezing  bibasilar HEART: regular rate & rhythm, no murmurs and no gallops ABDOMEN:abdomen soft, obese and normal bowel sounds BACK: Back symmetric, no curvature. EXTREMITIES:less then 2 second capillary refill, no joint deformities, effusion, or inflammation, no skin discoloration, no cyanosis  NEURO: alert & oriented x 3 with fluent speech, no focal motor/sensory deficits, gait  normal   LABORATORY DATA: CBC    Component Value Date/Time   WBC 7.9 02/24/2016 1024   RBC 6.62 (H) 02/24/2016 1024   HGB 13.0 02/24/2016 1024   HCT 49.4 02/24/2016 1024   PLT 208 02/24/2016 1024   MCV 74.6 (L) 02/24/2016 1024   MCH 19.6 (L) 02/24/2016 1024   MCHC 26.3 (L) 02/24/2016 1024   RDW 19.7 (H) 02/24/2016 1024   LYMPHSABS 2.8 11/25/2015 0935   MONOABS 0.6 11/25/2015 0935   EOSABS 0.2 11/25/2015 0935   BASOSABS 0.0 11/25/2015 0935      Chemistry      Component Value Date/Time   NA 140 02/28/2016 0645   K 4.2 02/28/2016 0645   CL 88 (L) 02/28/2016 0645   CO2 42 (H) 02/28/2016 0645   BUN 23 (H) 02/28/2016 0645   CREATININE 1.16 02/28/2016 0645   CREATININE 1.43 (H) 10/27/2015 0919      Component Value Date/Time   CALCIUM 9.6 02/28/2016 0645   ALKPHOS 65 02/24/2016 1024   AST 13 (L) 02/24/2016 1024   ALT 9 (L) 02/24/2016 1024   BILITOT 0.6 02/24/2016 1024        PENDING LABS:   RADIOGRAPHIC STUDIES:  No results found.   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  JAK/MPL/CALR mutations negative.  He requires intermittent therapeutic phlebotomies to maintain a HCT of 50% or less.  Labs today: CBC diff, ferritin.  I personally reviewed and went over laboratory results with the patient.  The results are noted within this dictation.   Labs in 3 months: CBC diff, BMET, ferritin  He reports a 3-4 day history of coughing green sputum.  Rx is sent to pharmacy for [place].  Recent hospitalization for CHF exacerbation is noted.  Hospital notes are reviewed.  Smoking cessation education provided today.  He is down to 3 cigarettes per day.  He is on Nicoderm that is prescribed by his primary care provider.  He is quitting with his wife; who is also successful in her smoking cessation program as well.  Return in 3 months for follow-up.     ORDERS PLACED FOR THIS  ENCOUNTER: Orders Placed This Encounter  Procedures  . CBC with Differential  . Basic metabolic panel  . Ferritin    MEDICATIONS PRESCRIBED THIS ENCOUNTER: Meds ordered this encounter  Medications  . azithromycin (ZITHROMAX) 250 MG tablet    Sig: Take 2 tablets PO today and 1 daily thereafter until Rx is complete    Dispense:  6 each    Refill:  0    Order Specific Question:   Supervising Provider    Answer:   Patrici Ranks [id number]    THERAPY PLAN:  We will maintain hematocrit at 50% or less with therapeutic phlebotomies. He is going to continue to work on smoking cessation.  All questions were answered. The patient knows to call the clinic with any problems, questions or concerns. We can certainly see the patient much sooner if necessary.  Patient and plan discussed with Dr. Ancil Linsey and she is in agreement with the aforementioned.  This note is electronically signed by: Doy Mince 03/30/2016 10:19 AM

## 2016-03-30 NOTE — Assessment & Plan Note (Addendum)
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  JAK/MPL/CALR mutations negative.  He requires intermittent therapeutic phlebotomies to maintain a HCT of 50% or less.  Labs today: CBC diff, ferritin.  I personally reviewed and went over laboratory results with the patient.  The results are noted within this dictation.   Labs in 3 months: CBC diff, BMET, ferritin  He reports a 3-4 day history of coughing green sputum.  Rx is sent to pharmacy for [place].  Recent hospitalization for CHF exacerbation is noted.  Hospital notes are reviewed.  Smoking cessation education provided today.  He is down to 3 cigarettes per day.  He is on Nicoderm that is prescribed by his primary care provider.  He is quitting with his wife; who is also successful in her smoking cessation program as well.  Return in 3 months for follow-up.

## 2016-03-31 MED ORDER — FULVESTRANT 250 MG/5ML IM SOLN
INTRAMUSCULAR | Status: AC
  Filled 2016-03-31: qty 10

## 2016-04-11 ENCOUNTER — Encounter (HOSPITAL_COMMUNITY)
Admission: RE | Admit: 2016-04-11 | Discharge: 2016-04-11 | Disposition: A | Discharge status: Home / Self Care | Payer: Medicare Other | Source: Ambulatory Visit | Attending: Hematology & Oncology | Admitting: Hematology & Oncology

## 2016-04-11 ENCOUNTER — Encounter (HOSPITAL_COMMUNITY): Payer: Self-pay

## 2016-04-11 DIAGNOSIS — D751 Secondary polycythemia: Secondary | ICD-10-CM | POA: Diagnosis present

## 2016-04-11 NOTE — Progress Notes (Signed)
Brian Parsons presents today for phlebotomy per MD orders. HGB/HCT:13.7/51.9 Phlebotomy procedure started at 0940 and ended at 0946 22 ounces removed. Patient tolerated procedure well. IV needle removed intact.

## 2016-04-20 ENCOUNTER — Encounter: Payer: Self-pay | Admitting: Cardiology

## 2016-04-20 ENCOUNTER — Ambulatory Visit: Payer: Medicare Other | Admitting: Cardiology

## 2016-04-20 NOTE — Progress Notes (Deleted)
Cardiology Office Note  Date: 04/20/2016   ID: Brian Parsons, DOB December 09, 1950, MRN [id number]  PCP: [name] PA-C  Primary Cardiologist: Rozann Lesches, MD   No chief complaint on file.   History of Present Illness: Brian Parsons is a 66 y.o. male last seen by Ms. Lawrence NP in July. He has a history of diastolic heart failure with chronic leg edema, nonobstructive CAD documented by cardiac catheterization earlier this year.  Past Medical History:  Diagnosis Date  . Bilateral leg edema   . CHF (congestive heart failure) ([place])   . COPD (chronic obstructive pulmonary disease) ([place])   . Coronary atherosclerosis    Nonobstructive at cardiac catheterization April 2017  . Essential hypertension   . Polycythemia     Past Surgical History:  Procedure Laterality Date  . CARDIAC CATHETERIZATION N/A 08/10/2015   Procedure: Left Heart Cath and Coronary Angiography;  Surgeon: Troy Sine, MD;  Location: [facility];  Service: Cardiovascular;  Laterality: N/A;    Current Outpatient Prescriptions  Medication Sig Dispense Refill  . ADVAIR DISKUS 250-50 MCG/DOSE AEPB USE 1 INHALATION BY MOUTH EVERY 12 HOURS. <<RINSE MOUTH AFTER USE>>. 60 each 3  . aspirin EC 81 MG tablet Take 81 mg by mouth daily.    . Aspirin-Acetaminophen-Caffeine (GOODY HEADACHE PO) Take 1 Package by mouth daily as needed (headache).    Marland Kitchen azithromycin (ZITHROMAX) 250 MG tablet Take 2 tablets PO today and 1 daily thereafter until Rx is complete 6 each 0  . ipratropium-albuterol (DUONEB) 0.5-2.5 (3) MG/3ML SOLN Take 3 mLs by nebulization every 6 (six) hours. (Patient taking differently: Take 3 mLs by nebulization 2 (two) times daily as needed (for wheezing or shortness of breath). ) 360 mL 1  . lisinopril (PRINIVIL,ZESTRIL) 20 MG tablet Take 0.5 tablets (10 mg total) by mouth daily. 30 tablet 1  . nicotine (NICODERM CQ) 21 mg/24hr patch Place 1 patch (21 mg total) onto the skin daily. 28 patch 0  .  potassium chloride (K-DUR,KLOR-CON) 10 MEQ tablet Take 10 mEq by mouth daily.    Marland Kitchen torsemide (DEMADEX) 20 MG tablet Take 4 tablets (80 mg total) by mouth daily. (Patient taking differently: Take 60 mg by mouth daily. ) 130 tablet 1  . vitamin B-12 (CYANOCOBALAMIN) 1000 MCG tablet Take 1,000 mcg by mouth daily.     No current facility-administered medications for this visit.    Allergies:  Amoxicillin and Penicillins   Social History: The patient  reports that he has been smoking Cigarettes.  He started smoking about 55 years ago. He has a 25.00 pack-year smoking history. He has never used smokeless tobacco. He reports that he does not drink alcohol or use drugs.   Family History: The patient's family history includes Emphysema in his father; Stroke in his mother.   ROS:  Please see the history of present illness. Otherwise, complete review of systems is positive for {NONE DEFAULTED:18576::"none"}.  All other systems are reviewed and negative.   Physical Exam: VS:  There were no vitals taken for this visit., BMI There is no height or weight on file to calculate BMI.  Wt Readings from Last 3 Encounters:  04/11/16 243 lb (110.2 kg)  03/30/16 243 lb (110.2 kg)  02/28/16 245 lb 9.6 oz (111.4 kg)    General: Obese male, appears comfortable at rest. HEENT: Conjunctiva and lids normal, oropharynx clear. Neck: Supple, no elevated JVP or carotid bruits, no thyromegaly. Lungs: Decreased breath sounds without active wheezing,  nonlabored breathing at rest. Cardiac: Regular rate and rhythm with ectopy, no S3 or significant systolic murmur, no pericardial rub. Abdomen: Protuberant, nontender, bowel sounds present, no guarding or rebound. Extremities: 1-2+ leg edema, distal pulses 2+. Skin: Warm and dry. Musculoskeletal: No kyphosis. Neuropsychiatric: Alert and oriented x3, affect grossly appropriate.  ECG: I personally reviewed the tracing from 02/24/2016 which showed sinus rhythm with PACs,  anterolateral ST-T wave abnormalities, rule out ischemic changes.  Recent Labwork: 02/24/2016: ALT 9; AST 13; B Natriuretic Peptide 106.0 02/28/2016: BUN 23; Creatinine, Ser 1.16; Potassium 4.2; Sodium 140 03/30/2016: Hemoglobin 13.7; Platelets 165     Component Value Date/Time   CHOL 132 07/30/2015 0441   TRIG 144 07/30/2015 0441   HDL 27 (L) 07/30/2015 0441   CHOLHDL 4.9 07/30/2015 0441   VLDL 29 07/30/2015 0441   LDLCALC 76 07/30/2015 0441    Other Studies Reviewed Today:  Cardiac catheterization 08/10/2015:  3rd Mrg lesion, 40% stenosed.  Ost 1st Mrg to 1st Mrg lesion, 30% stenosed.  Mid RCA lesion, 20% stenosed.  Dist RCA lesion, 20% stenosed.   Mild CAD with a normal LAD; 30% ostial narrowing in the OM1 vessel of the left circumflex artery artery with 40% distal tubular circumflex stenosis, and a large dominant RCA with mild 20% mid and distal stenoses.  Preserved global LV function with an EF of 55%.  LVEDP 15 mm Hg.  Echocardiogram 07/30/2015: Study Conclusions  - Left ventricle: The cavity size was normal. Wall thickness was   increased in a pattern of mild LVH. Systolic function was normal.   The estimated ejection fraction was in the range of 60% to 65%.   Left ventricular diastolic function parameters were normal. - Aortic valve: Mildly calcified annulus. Trileaflet; mildly   thickened leaflets. Valve area (VTI): 2.72 cm^2. Valve area   (Vmax): 2.69 cm^2. - Left atrium: The atrium was mildly dilated. - Right ventricle: The cavity size was moderately dilated. It   shares the apex with the LV. - Right atrium: The atrium was mildly dilated. - Atrial septum: No defect or patent foramen ovale was identified. - Systemic veins: IVC is dilated with normal respiratory variation,   estimated RA pressure is 8 mmHg. - Technically difficult study.  Assessment and Plan:    Current medicines were reviewed with the patient today.  No orders of the defined types  were placed in this encounter.   Disposition:  Signed, Satira Sark, MD, [facility] 04/20/2016 9:00 AM    [facility] [place], [place], [place] [address] Phone: [phone]; Fax: [phone]

## 2016-04-29 ENCOUNTER — Encounter (HOSPITAL_COMMUNITY): Payer: Self-pay | Admitting: Emergency Medicine

## 2016-04-29 ENCOUNTER — Emergency Department (HOSPITAL_COMMUNITY): Payer: Medicare Other

## 2016-04-29 ENCOUNTER — Other Ambulatory Visit: Payer: Self-pay

## 2016-04-29 ENCOUNTER — Inpatient Hospital Stay (HOSPITAL_COMMUNITY)
Admission: EM | Admit: 2016-04-29 | Discharge: 2016-05-05 | DRG: 291 | Disposition: A | Discharge status: Home / Self Care | Payer: Medicare Other | Attending: Internal Medicine | Admitting: Internal Medicine

## 2016-04-29 DIAGNOSIS — J438 Other emphysema: Secondary | ICD-10-CM | POA: Diagnosis not present

## 2016-04-29 DIAGNOSIS — N179 Acute kidney failure, unspecified: Secondary | ICD-10-CM | POA: Diagnosis present

## 2016-04-29 DIAGNOSIS — Z881 Allergy status to other antibiotic agents status: Secondary | ICD-10-CM

## 2016-04-29 DIAGNOSIS — J449 Chronic obstructive pulmonary disease, unspecified: Secondary | ICD-10-CM | POA: Diagnosis present

## 2016-04-29 DIAGNOSIS — I10 Essential (primary) hypertension: Secondary | ICD-10-CM | POA: Diagnosis not present

## 2016-04-29 DIAGNOSIS — I2510 Atherosclerotic heart disease of native coronary artery without angina pectoris: Secondary | ICD-10-CM | POA: Diagnosis present

## 2016-04-29 DIAGNOSIS — Z9981 Dependence on supplemental oxygen: Secondary | ICD-10-CM

## 2016-04-29 DIAGNOSIS — D751 Secondary polycythemia: Secondary | ICD-10-CM | POA: Diagnosis present

## 2016-04-29 DIAGNOSIS — Z880 Allergy status to penicillin: Secondary | ICD-10-CM

## 2016-04-29 DIAGNOSIS — J9620 Acute and chronic respiratory failure, unspecified whether with hypoxia or hypercapnia: Secondary | ICD-10-CM | POA: Diagnosis present

## 2016-04-29 DIAGNOSIS — F17210 Nicotine dependence, cigarettes, uncomplicated: Secondary | ICD-10-CM | POA: Diagnosis present

## 2016-04-29 DIAGNOSIS — Z823 Family history of stroke: Secondary | ICD-10-CM

## 2016-04-29 DIAGNOSIS — I5033 Acute on chronic diastolic (congestive) heart failure: Secondary | ICD-10-CM | POA: Diagnosis present

## 2016-04-29 DIAGNOSIS — Z7982 Long term (current) use of aspirin: Secondary | ICD-10-CM

## 2016-04-29 DIAGNOSIS — I509 Heart failure, unspecified: Secondary | ICD-10-CM

## 2016-04-29 DIAGNOSIS — Z825 Family history of asthma and other chronic lower respiratory diseases: Secondary | ICD-10-CM | POA: Diagnosis not present

## 2016-04-29 DIAGNOSIS — I110 Hypertensive heart disease with heart failure: Secondary | ICD-10-CM | POA: Diagnosis not present

## 2016-04-29 DIAGNOSIS — R748 Abnormal levels of other serum enzymes: Secondary | ICD-10-CM | POA: Diagnosis present

## 2016-04-29 DIAGNOSIS — J9621 Acute and chronic respiratory failure with hypoxia: Secondary | ICD-10-CM | POA: Diagnosis present

## 2016-04-29 DIAGNOSIS — R7989 Other specified abnormal findings of blood chemistry: Secondary | ICD-10-CM | POA: Diagnosis present

## 2016-04-29 DIAGNOSIS — Z720 Tobacco use: Secondary | ICD-10-CM | POA: Diagnosis present

## 2016-04-29 DIAGNOSIS — R0602 Shortness of breath: Secondary | ICD-10-CM | POA: Diagnosis not present

## 2016-04-29 DIAGNOSIS — R778 Other specified abnormalities of plasma proteins: Secondary | ICD-10-CM

## 2016-04-29 HISTORY — DX: Dependence on supplemental oxygen: Z99.81

## 2016-04-29 LAB — CBC WITH DIFFERENTIAL/PLATELET
BASOS ABS: 0 10*3/uL (ref 0.0–0.1)
BASOS PCT: 0 %
EOS PCT: 1 %
Eosinophils Absolute: 0.1 10*3/uL (ref 0.0–0.7)
HCT: 45.5 % (ref 39.0–52.0)
Hemoglobin: 12 g/dL — ABNORMAL LOW (ref 13.0–17.0)
LYMPHS PCT: 29 %
Lymphs Abs: 2.1 10*3/uL (ref 0.7–4.0)
MCH: 19.9 pg — ABNORMAL LOW (ref 26.0–34.0)
MCHC: 26.4 g/dL — ABNORMAL LOW (ref 30.0–36.0)
MCV: 75.5 fL — AB (ref 78.0–100.0)
Monocytes Absolute: 0.6 10*3/uL (ref 0.1–1.0)
Monocytes Relative: 9 %
NEUTROS ABS: 4.4 10*3/uL (ref 1.7–7.7)
Neutrophils Relative %: 61 %
PLATELETS: 151 10*3/uL (ref 150–400)
RBC: 6.03 MIL/uL — AB (ref 4.22–5.81)
RDW: 20.8 % — ABNORMAL HIGH (ref 11.5–15.5)
WBC: 7.2 10*3/uL (ref 4.0–10.5)

## 2016-04-29 LAB — COMPREHENSIVE METABOLIC PANEL
ALBUMIN: 3.5 g/dL (ref 3.5–5.0)
ALT: 15 U/L — AB (ref 17–63)
AST: 17 U/L (ref 15–41)
Alkaline Phosphatase: 60 U/L (ref 38–126)
Anion gap: 7 (ref 5–15)
BUN: 31 mg/dL — ABNORMAL HIGH (ref 6–20)
CO2: 36 mmol/L — AB (ref 22–32)
CREATININE: 1.56 mg/dL — AB (ref 0.61–1.24)
Calcium: 8.6 mg/dL — ABNORMAL LOW (ref 8.9–10.3)
Chloride: 95 mmol/L — ABNORMAL LOW (ref 101–111)
GFR calc Af Amer: 51 mL/min — ABNORMAL LOW (ref >60)
GFR, EST NON AFRICAN AMERICAN: 44 mL/min — AB (ref >60)
GLUCOSE: 120 mg/dL — AB (ref 65–99)
Potassium: 4.3 mmol/L (ref 3.5–5.1)
Sodium: 138 mmol/L (ref 135–145)
Total Bilirubin: 0.4 mg/dL (ref 0.3–1.2)
Total Protein: 6.4 g/dL — ABNORMAL LOW (ref 6.5–8.1)

## 2016-04-29 LAB — BRAIN NATRIURETIC PEPTIDE: B NATRIURETIC PEPTIDE 5: 325 pg/mL — AB (ref 0.0–100.0)

## 2016-04-29 LAB — TROPONIN I
Troponin I: 0.11 ng/mL (ref <0.03)
Troponin I: 0.1 ng/mL (ref <0.03)

## 2016-04-29 MED ORDER — CARVEDILOL 3.125 MG PO TABS
3.1250 mg | ORAL_TABLET | Freq: Two times a day (BID) | ORAL | Status: DC
  Administered 2016-04-29 – 2016-04-30 (×3): 3.125 mg via ORAL
  Filled 2016-04-29 (×3): qty 1

## 2016-04-29 MED ORDER — SODIUM CHLORIDE 0.9% FLUSH
3.0000 mL | Freq: Two times a day (BID) | INTRAVENOUS | Status: DC
  Administered 2016-04-30 – 2016-05-05 (×9): 3 mL via INTRAVENOUS

## 2016-04-29 MED ORDER — ONDANSETRON HCL 4 MG/2ML IJ SOLN: 4.0000 mg | Freq: Four times a day (QID) | INTRAMUSCULAR | Status: DC | PRN

## 2016-04-29 MED ORDER — MOMETASONE FURO-FORMOTEROL FUM 200-5 MCG/ACT IN AERO
INHALATION_SPRAY | RESPIRATORY_TRACT | Status: AC
  Filled 2016-04-29: qty 8.8

## 2016-04-29 MED ORDER — VITAMIN B-12 1000 MCG PO TABS
1000.0000 ug | ORAL_TABLET | Freq: Every day | ORAL | Status: DC
  Administered 2016-04-30 – 2016-05-05 (×6): 1000 ug via ORAL
  Filled 2016-04-29 (×6): qty 1

## 2016-04-29 MED ORDER — NICOTINE 21 MG/24HR TD PT24
21.0000 mg | MEDICATED_PATCH | Freq: Every day | TRANSDERMAL | Status: DC
  Administered 2016-04-29 – 2016-05-04 (×6): 21 mg via TRANSDERMAL
  Filled 2016-04-29 (×7): qty 1

## 2016-04-29 MED ORDER — IPRATROPIUM-ALBUTEROL 0.5-2.5 (3) MG/3ML IN SOLN
3.0000 mL | Freq: Four times a day (QID) | RESPIRATORY_TRACT | Status: DC | PRN
  Administered 2016-05-03: 3 mL via RESPIRATORY_TRACT
  Filled 2016-04-29: qty 3

## 2016-04-29 MED ORDER — ACETAMINOPHEN 325 MG PO TABS
650.0000 mg | ORAL_TABLET | ORAL | Status: DC | PRN
  Administered 2016-04-30 – 2016-05-04 (×4): 650 mg via ORAL
  Filled 2016-04-29 (×4): qty 2

## 2016-04-29 MED ORDER — FUROSEMIDE 10 MG/ML IJ SOLN
80.0000 mg | Freq: Two times a day (BID) | INTRAMUSCULAR | Status: DC
  Administered 2016-04-29 – 2016-05-05 (×12): 80 mg via INTRAVENOUS
  Filled 2016-04-29 (×12): qty 8

## 2016-04-29 MED ORDER — SODIUM CHLORIDE 0.9 % IV SOLN: 250.0000 mL | INTRAVENOUS | Status: DC | PRN

## 2016-04-29 MED ORDER — MOMETASONE FURO-FORMOTEROL FUM 200-5 MCG/ACT IN AERO
2.0000 | INHALATION_SPRAY | Freq: Two times a day (BID) | RESPIRATORY_TRACT | Status: DC
  Administered 2016-04-29 – 2016-05-05 (×12): 2 via RESPIRATORY_TRACT
  Filled 2016-04-29: qty 8.8

## 2016-04-29 MED ORDER — ALBUTEROL SULFATE (2.5 MG/3ML) 0.083% IN NEBU
5.0000 mg | INHALATION_SOLUTION | Freq: Once | RESPIRATORY_TRACT | Status: AC
  Administered 2016-04-29: 5 mg via RESPIRATORY_TRACT
  Filled 2016-04-29: qty 6

## 2016-04-29 MED ORDER — FUROSEMIDE 10 MG/ML IJ SOLN
40.0000 mg | Freq: Once | INTRAMUSCULAR | Status: AC
  Administered 2016-04-29: 40 mg via INTRAVENOUS
  Filled 2016-04-29: qty 4

## 2016-04-29 MED ORDER — SODIUM CHLORIDE 0.9% FLUSH: 3.0000 mL | INTRAVENOUS | Status: DC | PRN

## 2016-04-29 MED ORDER — ASPIRIN EC 81 MG PO TBEC
81.0000 mg | DELAYED_RELEASE_TABLET | Freq: Every day | ORAL | Status: DC
  Administered 2016-04-30 – 2016-05-05 (×6): 81 mg via ORAL
  Filled 2016-04-29 (×6): qty 1

## 2016-04-29 MED ORDER — ENOXAPARIN SODIUM 40 MG/0.4ML ~~LOC~~ SOLN
40.0000 mg | SUBCUTANEOUS | Status: DC
  Administered 2016-04-29 – 2016-05-04 (×6): 40 mg via SUBCUTANEOUS
  Filled 2016-04-29 (×6): qty 0.4

## 2016-04-29 NOTE — H&P (Addendum)
History and Physical    Brian Parsons [id number] DOB: 1950/12/27 DOA: 04/29/2016  PCP: [name], PA-C  Patient coming from: home  Chief Complaint: Shortness of breath  HPI: Brian Parsons is a 66 y.o. male with medical history significant of congestive heart failure on diuretics has been complaining of worsening lower extremity edema for the past 2 weeks. He reports compliance with medications, salt and fluid intake. He saw his primary care physician on Monday and torsemide was changed to Bumex. He reports he's noticed decreased urine output. No chest pain. He has a chronic cough which is unchanged. No fever. He describes orthopnea and paroxysmal nocturnal dyspnea. No nausea, vomiting, diarrhea. Patient wears oxygen during the nights. He continues .  ED Course: In the emergency room he was noted to have 2+ lower extremity edema. Troponin was mildly elevated at 0.11, which is consistent with prior levels. BNP was over 300. He was noted to be in congestive heart failure has been referred for admission.  Review of Systems: As per HPI otherwise 10 point review of systems negative.    Past Medical History:  Diagnosis Date  . Bilateral leg edema   . CHF (congestive heart failure) ([place])   . COPD (chronic obstructive pulmonary disease) ([place])   . Coronary atherosclerosis    Nonobstructive at cardiac catheterization April 2017  . Essential hypertension   . On home O2    2L N/C  . Polycythemia     Past Surgical History:  Procedure Laterality Date  . CARDIAC CATHETERIZATION N/A 08/10/2015   Procedure: Left Heart Cath and Coronary Angiography;  Surgeon: Troy Sine, MD;  Location: [facility];  Service: Cardiovascular;  Laterality: N/A;     reports that he has been smoking Cigarettes.  He started smoking about 55 years ago. He has a 25.00 pack-year smoking history. He has never used smokeless tobacco. He reports that he does not drink alcohol or use drugs.  Allergies    Allergen Reactions  . Amoxicillin Hives  . Penicillins Hives    Has patient had a PCN reaction causing immediate rash, facial/tongue/throat swelling, SOB or lightheadedness with hypotension: Unknown Has patient had a PCN reaction causing severe rash involving mucus membranes or skin necrosis: No Has patient had a PCN reaction that required hospitalization No Has patient had a PCN reaction occurring within the last 10 years: No If all of the above answers are "NO", then may proceed with Cephalosporin use.     Family History  Problem Relation Age of Onset  . Stroke Mother   . Emphysema Father      Prior to Admission medications   Medication Sig Start Date End Date Taking? Authorizing Provider  ADVAIR DISKUS 250-50 MCG/DOSE AEPB USE 1 INHALATION BY MOUTH EVERY 12 HOURS. <<RINSE MOUTH AFTER USE>>. 10/28/14  Yes Patrici Ranks, MD  aspirin EC 81 MG tablet Take 81 mg by mouth daily.   Yes Historical Provider, MD  Aspirin-Acetaminophen-Caffeine (GOODY HEADACHE PO) Take 1 Package by mouth daily as needed (headache).   Yes Historical Provider, MD  bumetanide (BUMEX) 1 MG tablet Take 3 tablets by mouth daily. 04/24/16  Yes Historical Provider, MD  ipratropium-albuterol (DUONEB) 0.5-2.5 (3) MG/3ML SOLN Take 3 mLs by nebulization every 6 (six) hours. Patient taking differently: Take 3 mLs by nebulization 2 (two) times daily as needed (for wheezing or shortness of breath).  08/07/14  Yes Kathie Dike, MD  lisinopril (PRINIVIL,ZESTRIL) 20 MG tablet Take 0.5 tablets (10 mg total)  by mouth daily. 08/07/14  Yes Kathie Dike, MD  MAGNESIUM PO Take 1 tablet by mouth daily.   Yes Historical Provider, MD  nicotine (NICODERM CQ) 21 mg/24hr patch Place 1 patch (21 mg total) onto the skin daily. 08/17/15  Yes Lendon Colonel, NP  potassium chloride (K-DUR,KLOR-CON) 10 MEQ tablet Take 10 mEq by mouth daily. 07/05/15  Yes Historical Provider, MD  vitamin B-12 (CYANOCOBALAMIN) 1000 MCG tablet Take 1,000 mcg  by mouth daily.   Yes Historical Provider, MD    Physical Exam: Vitals:   04/29/16 1202 04/29/16 1206 04/29/16 1216 04/29/16 1312  BP: 127/66     Pulse: 94     Resp: 20     Temp: 97.5 F (36.4 C)     TempSrc: Oral     SpO2: 95%  97% 95%  Weight:  115 kg (253 lb 8 oz)    Height:  5\' 7"  (1.702 m)        Constitutional: NAD, calm, comfortable Vitals:   04/29/16 1202 04/29/16 1206 04/29/16 1216 04/29/16 1312  BP: 127/66     Pulse: 94     Resp: 20     Temp: 97.5 F (36.4 C)     TempSrc: Oral     SpO2: 95%  97% 95%  Weight:  115 kg (253 lb 8 oz)    Height:  5\' 7"  (1.702 m)     Eyes: PERRL, lids and conjunctivae normal ENMT: Mucous membranes are moist. Posterior pharynx clear of any exudate or lesions.Normal dentition.  Neck: normal, supple, no masses, no thyromegaly Respiratory: clear to auscultation bilaterally, no wheezing, no crackles. Normal respiratory effort. No accessory muscle use.  Cardiovascular: Regular rate and rhythm, no murmurs / rubs / gallops. 2+ extremity edema. 2+ pedal pulses. No carotid bruits.  Abdomen: no tenderness, no masses palpated. No hepatosplenomegaly. Bowel sounds positive.  Musculoskeletal: no clubbing / cyanosis. No joint deformity upper and lower extremities. Good ROM, no contractures. Normal muscle tone.  Skin: no rashes, lesions, ulcers. No induration Neurologic: CN 2-12 grossly intact. Sensation intact, DTR normal. Strength 5/5 in all 4.  Psychiatric: Normal judgment and insight. Alert and oriented x 3. Normal mood.    Labs on Admission: I have personally reviewed following labs and imaging studies  CBC:  Recent Labs Lab 04/29/16 1233  WBC 7.2  NEUTROABS 4.4  HGB 12.0*  HCT 45.5  MCV 75.5*  PLT 123XX123   Basic Metabolic Panel:  Recent Labs Lab 04/29/16 1233  NA 138  K 4.3  CL 95*  CO2 36*  GLUCOSE 120*  BUN 31*  CREATININE 1.56*  CALCIUM 8.6*   GFR: Estimated Creatinine Clearance: 55.7 mL/min (by C-G formula based on  SCr of 1.56 mg/dL (H)). Liver Function Tests:  Recent Labs Lab 04/29/16 1233  AST 17  ALT 15*  ALKPHOS 60  BILITOT 0.4  PROT 6.4*  ALBUMIN 3.5   No results for input(s): LIPASE, AMYLASE in the last 168 hours. No results for input(s): AMMONIA in the last 168 hours. Coagulation Profile: No results for input(s): INR, PROTIME in the last 168 hours. Cardiac Enzymes:  Recent Labs Lab 04/29/16 1233  TROPONINI 0.11*   BNP (last 3 results) No results for input(s): PROBNP in the last 8760 hours. HbA1C: No results for input(s): HGBA1C in the last 72 hours. CBG: No results for input(s): GLUCAP in the last 168 hours. Lipid Profile: No results for input(s): CHOL, HDL, LDLCALC, TRIG, CHOLHDL, LDLDIRECT in the last 72 hours. Thyroid  Function Tests: No results for input(s): TSH, T4TOTAL, FREET4, T3FREE, THYROIDAB in the last 72 hours. Anemia Panel: No results for input(s): VITAMINB12, FOLATE, FERRITIN, TIBC, IRON, RETICCTPCT in the last 72 hours. Urine analysis:    Component Value Date/Time   COLORURINE YELLOW 11/22/2015 [place] 11/22/2015 1403   LABSPEC 1.010 11/22/2015 1403   PHURINE 5.5 11/22/2015 1403   GLUCOSEU NEGATIVE 11/22/2015 1403   HGBUR NEGATIVE 11/22/2015 1403   BILIRUBINUR NEGATIVE 11/22/2015 1403   KETONESUR NEGATIVE 11/22/2015 1403   PROTEINUR NEGATIVE 11/22/2015 1403   NITRITE NEGATIVE 11/22/2015 1403   LEUKOCYTESUR NEGATIVE 11/22/2015 1403   Sepsis Labs: !!!!!!!!!!!!!!!!!!!!!!!!!!!!!!!!!!!!!!!!!!!! @LABRCNTIP (procalcitonin:4,lacticidven:4) )No results found for this or any previous visit (from the past 240 hour(s)).   Radiological Exams on Admission: Dg Chest 2 View  Result Date: 04/29/2016 CLINICAL DATA:  SOB and swelling from knees down, SOB worsening last night, hx of COPD and CHF EXAM: CHEST  2 VIEW COMPARISON:  02/27/2016 FINDINGS: Cardiac silhouette is mildly enlarged. No mediastinal or hilar masses. No evidence of adenopathy. There  are prominent bronchovascular markings most evident in the lower lungs. There is no lung consolidation to suggest pneumonia. No pulmonary edema. No pleural effusion pneumothorax. There has been a previous resection of the distal right clavicle, stable. IMPRESSION: No acute cardiopulmonary disease Electronically Signed   By: Lajean Manes M.D.   On: 04/29/2016 12:54    EKG: Independently reviewed. Shows diffuse t wave inversions and st depression in anterolateral leads, unchanged from prior EKG  Assessment/Plan Active Problems:   COPD (chronic obstructive pulmonary disease) (HCC)   Elevated troponin   Acute on chronic respiratory failure (HCC)   Tobacco abuse   Essential hypertension   Polycythemia, secondary   CHF, acute on chronic (HCC)   AKI (acute kidney injury) ([place])   CHF exacerbation ([place])    1. Acute on chronic congestive heart failure, likely diastolic. Repeat echocardiogram. Start on intravenous Lasix. Monitor intake and output. He reports that his dry weight is close to 240 pounds. Weight in the emergency room was noted to be 253 pounds. Will hold on ACE inhibitor due to rising creatinine. Start on low-dose beta blocker.  2. Elevated troponin. Likely related to congestive heart failure. No new EKG changes. No chest pain. Patient had a cardiac catheterization in 07/2015 which showed nonobstructive disease. Recommendations were for medical management. Continue to cycle troponins.  3. AKI. likely related to congestive heart failure. Continue diuresis and monitor renal function.  4. 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 followed by hematology. He receives intermittent phlebotomies.  5. COPD . no wheezing at this time. Will continue home medications.  6. Tobacco use. Continue nicotine patch.  7. Hypertension. Currently stable. We'll hold ACE inhibitor due to renal dysfunction. Start on low-dose beta blocker.  8. Acute on chronic respiratory failure with hypoxia. Patient  normally wears oxygen only at night. He was noted to be hypoxic on room air on admission. Worsening respiratory status related to decompensated heart failure. We'll try and wean off oxygen as tolerated.   DVT prophylaxis: lovenox Code Status: full code Family Communication: discussed with family at bedside Disposition Plan: discharge home once improved Consults called:  Admission status: inpatient, telemetry   Sandia Pfund MD [facility] Pager [phone]  If 7PM-7AM, please contact night-coverage [email or web address] Password [email or web address]  04/29/2016, 4:55 PM

## 2016-04-29 NOTE — ED Provider Notes (Signed)
[facility] DEPT Provider Note   CSN: [id number] Arrival date & time: 04/29/16  1201     History   Chief Complaint Chief Complaint  Patient presents with  . Leg Swelling  . Shortness of Breath    HPI Brian Parsons is a 66 y.o. male.  HPI  Pt was seen at 1230. Per pt, c/o gradual onset and worsening of persistent pedal edema for the past 1 to 2 weeks. Has been associated with SOB for the past 1 week. Pt has been unable to lay flat due to increasing SOB. States he has also gained "11#" over the past several weeks. Pt's PMD changed torsemide to Bumex 5 days ago without change in his symptoms. Denies CP/palpitations, no cough, no abd pain, no N/V/D, no fevers, no back pain.   Past Medical History:  Diagnosis Date  . Bilateral leg edema   . CHF (congestive heart failure) ([place])   . COPD (chronic obstructive pulmonary disease) ([place])   . Coronary atherosclerosis    Nonobstructive at cardiac catheterization April 2017  . Essential hypertension   . On home O2    2L N/C  . Polycythemia     Patient Active Problem List   Diagnosis Date Noted  . CHF (congestive heart failure) ([place]) 02/24/2016  . Exertional dyspnea   . Abnormal nuclear stress test 08/04/2015  . Chronic diastolic heart failure ([place]) 08/04/2015  . Essential hypertension 08/04/2015  . Polycythemia, secondary 08/04/2015  . COPD exacerbation ([place]) 08/04/2015  . Tobacco abuse 08/02/2015  . Acute on chronic renal insufficiency 07/28/2015  . Acute diastolic CHF (congestive heart failure) ([place]) 07/28/2015  . Acute respiratory failure with hypoxia ([place]) 08/04/2014  . COPD (chronic obstructive pulmonary disease) ([place]) 08/03/2014  . Hypoxia 08/03/2014  . Lower extremity edema 08/03/2014  . Elevated troponin 08/03/2014  . Dyspnea 08/03/2014    Past Surgical History:  Procedure Laterality Date  . CARDIAC CATHETERIZATION N/A 08/10/2015   Procedure: Left Heart Cath and Coronary Angiography;  Surgeon: Troy Sine, MD;   Location: [facility];  Service: Cardiovascular;  Laterality: N/A;       Home Medications    Prior to Admission medications   Medication Sig Start Date End Date Taking? Authorizing Provider  ADVAIR DISKUS 250-50 MCG/DOSE AEPB USE 1 INHALATION BY MOUTH EVERY 12 HOURS. <<RINSE MOUTH AFTER USE>>. 10/28/14   Patrici Ranks, MD  aspirin EC 81 MG tablet Take 81 mg by mouth daily.    Historical Provider, MD  Aspirin-Acetaminophen-Caffeine (GOODY HEADACHE PO) Take 1 Package by mouth daily as needed (headache).    Historical Provider, MD  azithromycin (ZITHROMAX) 250 MG tablet Take 2 tablets PO today and 1 daily thereafter until Rx is complete 03/30/16   Manon Hilding Kefalas, PA-C  ipratropium-albuterol (DUONEB) 0.5-2.5 (3) MG/3ML SOLN Take 3 mLs by nebulization every 6 (six) hours. Patient taking differently: Take 3 mLs by nebulization 2 (two) times daily as needed (for wheezing or shortness of breath).  08/07/14   Kathie Dike, MD  lisinopril (PRINIVIL,ZESTRIL) 20 MG tablet Take 0.5 tablets (10 mg total) by mouth daily. 08/07/14   Kathie Dike, MD  nicotine (NICODERM CQ) 21 mg/24hr patch Place 1 patch (21 mg total) onto the skin daily. 08/17/15   Lendon Colonel, NP  potassium chloride (K-DUR,KLOR-CON) 10 MEQ tablet Take 10 mEq by mouth daily. 07/05/15   Historical Provider, MD  torsemide (DEMADEX) 20 MG tablet Take 4 tablets (80 mg total) by mouth daily. Patient taking differently: Take  60 mg by mouth daily.  10/21/15   Satira Sark, MD  vitamin B-12 (CYANOCOBALAMIN) 1000 MCG tablet Take 1,000 mcg by mouth daily.    Historical Provider, MD    Family History Family History  Problem Relation Age of Onset  . Stroke Mother   . Emphysema Father     Social History Social History  Substance Use Topics  . Smoking status: Current Some Day Smoker    Packs/day: 0.50    Years: 50.00    Types: Cigarettes    Start date: 04/17/1961  . Smokeless tobacco: Never Used     Comment:  5 ciggs per  week / every other day   . Alcohol use No     Comment: No EtOH for 7 years     Allergies   Amoxicillin and Penicillins   Review of Systems Review of Systems ROS: Statement: All systems negative except as marked or noted in the HPI; Constitutional: Negative for fever and chills. ; ; Eyes: Negative for eye pain, redness and discharge. ; ; ENMT: Negative for ear pain, hoarseness, nasal congestion, sinus pressure and sore throat. ; ; Cardiovascular: Negative for chest pain, palpitations, diaphoresis, +dyspnea and peripheral edema. ; ; Respiratory: Negative for cough, wheezing and stridor. ; ; Gastrointestinal: Negative for nausea, vomiting, diarrhea, abdominal pain, blood in stool, hematemesis, jaundice and rectal bleeding. . ; ; Genitourinary: Negative for dysuria, flank pain and hematuria. ; ; Musculoskeletal: Negative for back pain and neck pain. Negative for swelling and trauma.; ; Skin: Negative for pruritus, rash, abrasions, blisters, bruising and skin lesion.; ; Neuro: Negative for headache, lightheadedness and neck stiffness. Negative for weakness, altered level of consciousness, altered mental status, extremity weakness, paresthesias, involuntary movement, seizure and syncope.       Physical Exam Updated Vital Signs BP 127/66 (BP Location: Left Arm)   Pulse 94   Temp 97.5 F (36.4 C) (Oral)   Resp 20   Ht 5\' 7"  (1.702 m)   Wt 253 lb 8 oz (115 kg)   SpO2 97%   BMI 39.70 kg/m   Physical Exam 1235: Physical examination:  Nursing notes reviewed; Vital signs and O2 SAT reviewed;  Constitutional: Well developed, Well nourished, Well hydrated, In no acute distress; Head:  Normocephalic, atraumatic; Eyes: EOMI, PERRL, No scleral icterus; ENMT: Mouth and pharynx normal, Mucous membranes moist; Neck: Supple, Full range of motion, No lymphadenopathy; Cardiovascular: Regular rate and rhythm, No gallop; Respiratory: Breath sounds coarse & equal bilaterally, No wheezes.  Speaking full  sentences with ease, Normal respiratory effort/excursion; Chest: Nontender, Movement normal; Abdomen: Soft, Nontender, Nondistended, Normal bowel sounds; Genitourinary: No CVA tenderness; Extremities: Pulses normal, No tenderness, +2 pedal edema bilat. No calf asymmetry.; Neuro: AA&Ox3, Major CN grossly intact.  Speech clear. No gross focal motor or sensory deficits in extremities.; Skin: Color normal, Warm, Dry.   ED Treatments / Results  Labs (all labs ordered are listed, but only abnormal results are displayed)   EKG  EKG Interpretation  Date/Time:  Saturday April 29 2016 12:17:47 EST Ventricular Rate:  87 PR Interval:    QRS Duration: 110 QT Interval:  371 QTC Calculation: 447 R Axis:   -30 Text Interpretation:  Sinus rhythm Left axis deviation Low voltage, extremity leads Abnormal R-wave progression, early transition Repol abnrm suggests ischemia, anterolateral Baseline wander Artifact When compared with ECG of 02/24/2016 and  08/01/2015 No significant change was found Confirmed by [facility]  MD, Nunzio Cory [phone]) on 04/29/2016 12:50:01 PM  EKG Interpretation  Date/Time:  Saturday April 29 2016 15:40:34 EST Ventricular Rate:  83 PR Interval:    QRS Duration: 91 QT Interval:  373 QTC Calculation: 439 R Axis:   -3 Text Interpretation:  Sinus rhythm Supraventricular bigeminy Since last tracing of earlier today No significant change was found Confirmed by [facility]  MD, Nunzio Cory [phone]) on 04/29/2016 5:46:12 PM        Radiology   Procedures Procedures (including critical care time)  Medications Ordered in ED Medications  albuterol (PROVENTIL) (2.5 MG/3ML) 0.083% nebulizer solution 5 mg (5 mg Nebulization Given 04/29/16 1312)     Initial Impression / Assessment and Plan / ED Course  I have reviewed the triage vital signs and the nursing notes.  Pertinent labs & imaging results that were available during my care of the patient were reviewed by me and considered in  my medical decision making (see chart for details).  MDM Reviewed: previous chart and nursing note Reviewed previous: labs and ECG Interpretation: labs, ECG and x-ray   Results for orders placed or performed during the hospital encounter of 04/29/16  CBC with Differential  Result Value Ref Range   WBC 7.2 4.0 - 10.5 K/uL   RBC 6.03 (H) 4.22 - 5.81 MIL/uL   Hemoglobin 12.0 (L) 13.0 - 17.0 g/dL   HCT 45.5 39.0 - 52.0 %   MCV 75.5 (L) 78.0 - 100.0 fL   MCH 19.9 (L) 26.0 - 34.0 pg   MCHC 26.4 (L) 30.0 - 36.0 g/dL   RDW 20.8 (H) 11.5 - 15.5 %   Platelets 151 150 - 400 K/uL   Neutrophils Relative % 61 %   Neutro Abs 4.4 1.7 - 7.7 K/uL   Lymphocytes Relative 29 %   Lymphs Abs 2.1 0.7 - 4.0 K/uL   Monocytes Relative 9 %   Monocytes Absolute 0.6 0.1 - 1.0 K/uL   Eosinophils Relative 1 %   Eosinophils Absolute 0.1 0.0 - 0.7 K/uL   Basophils Relative 0 %   Basophils Absolute 0.0 0.0 - 0.1 K/uL  Comprehensive metabolic panel  Result Value Ref Range   Sodium 138 135 - 145 mmol/L   Potassium 4.3 3.5 - 5.1 mmol/L   Chloride 95 (L) 101 - 111 mmol/L   CO2 36 (H) 22 - 32 mmol/L   Glucose, Bld 120 (H) 65 - 99 mg/dL   BUN 31 (H) 6 - 20 mg/dL   Creatinine, Ser 1.56 (H) 0.61 - 1.24 mg/dL   Calcium 8.6 (L) 8.9 - 10.3 mg/dL   Total Protein 6.4 (L) 6.5 - 8.1 g/dL   Albumin 3.5 3.5 - 5.0 g/dL   AST 17 15 - 41 U/L   ALT 15 (L) 17 - 63 U/L   Alkaline Phosphatase 60 38 - 126 U/L   Total Bilirubin 0.4 0.3 - 1.2 mg/dL   GFR calc non Af Amer 44 (L) >60 mL/min   GFR calc Af Amer 51 (L) >60 mL/min   Anion gap 7 5 - 15  Brain natriuretic peptide  Result Value Ref Range   B Natriuretic Peptide 325.0 (H) 0.0 - 100.0 pg/mL  Troponin I  Result Value Ref Range   Troponin I 0.11 (HH) <0.03 ng/mL   Dg Chest 2 View Result Date: 04/29/2016 CLINICAL DATA:  SOB and swelling from knees down, SOB worsening last night, hx of COPD and CHF EXAM: CHEST  2 VIEW COMPARISON:  02/27/2016 FINDINGS: Cardiac  silhouette is mildly enlarged. No mediastinal or hilar masses. No  evidence of adenopathy. There are prominent bronchovascular markings most evident in the lower lungs. There is no lung consolidation to suggest pneumonia. No pulmonary edema. No pleural effusion pneumothorax. There has been a previous resection of the distal right clavicle, stable. IMPRESSION: No acute cardiopulmonary disease Electronically Signed   By: Lajean Manes M.D.   On: 04/29/2016 12:54   Results for KOAH, HINCHEE (MRN [id number]) as of 04/29/2016 13:45  Ref. Range 02/24/2016 10:24 02/24/2016 15:22 02/24/2016 20:47 02/25/2016 02:56 04/29/2016 12:33  B Natriuretic Peptide Latest Ref Range: 0.0 - 100.0 pg/mL 106.0 (H)    325.0 (H)  Troponin I Latest Ref Range: <0.03 ng/mL 0.08 (HH) 0.09 (HH) 0.10 (HH) 0.08 (HH) 0.11 (HH)    1405:  BNP elevated from previous; will dose IV lasix. Dx and testing d/w pt and family.  Questions answered.  Verb understanding, agreeable to admit.  T/C to [facility]. Roderic Palau, case discussed, including:  HPI, pertinent PM/SHx, VS/PE, dx testing, ED course and treatment:  Agreeable to admit, requests to write temporary orders, obtain inpt tele bed to team APAdmits.     Final Clinical Impressions(s) / ED Diagnoses   Final diagnoses:  None    New Prescriptions New Prescriptions   No medications on file     Francine Graven, DO 05/02/16 1719

## 2016-04-29 NOTE — ED Notes (Signed)
Pt oxygenation 83% at rest.  Placing pt on 2 LPM via nasal cannula oxygenation increased to 92 %.

## 2016-04-29 NOTE — ED Triage Notes (Signed)
Pt states he has been having increased swelling for the past few weeks.  Saw pcp 2 weeks ago for same and Torsemide was changed to Bumex.  Symptoms continue and are getting worse.

## 2016-04-29 NOTE — ED Notes (Signed)
CRITICAL VALUE ALERT  Critical value received:  Troponin 0.11  Date of notification:  04/29/2016  Time of notification:  [id number]  Critical value read back:  Yes  Nurse who received alert:  Cena Benton  MD notified (1st page):  Dr. Thurnell Garbe  Time of first page:  1309  Responding MD:  Dr. Thurnell Garbe  Time MD responded:  1310

## 2016-04-30 ENCOUNTER — Inpatient Hospital Stay (HOSPITAL_COMMUNITY): Payer: Medicare Other

## 2016-04-30 DIAGNOSIS — I509 Heart failure, unspecified: Secondary | ICD-10-CM

## 2016-04-30 LAB — BASIC METABOLIC PANEL
Anion gap: 9 (ref 5–15)
BUN: 31 mg/dL — AB (ref 6–20)
CO2: 34 mmol/L — ABNORMAL HIGH (ref 22–32)
CREATININE: 1.43 mg/dL — AB (ref 0.61–1.24)
Calcium: 8.5 mg/dL — ABNORMAL LOW (ref 8.9–10.3)
Chloride: 95 mmol/L — ABNORMAL LOW (ref 101–111)
GFR calc Af Amer: 57 mL/min — ABNORMAL LOW (ref >60)
GFR, EST NON AFRICAN AMERICAN: 49 mL/min — AB (ref >60)
Glucose, Bld: 117 mg/dL — ABNORMAL HIGH (ref 65–99)
Potassium: 4.4 mmol/L (ref 3.5–5.1)
SODIUM: 138 mmol/L (ref 135–145)

## 2016-04-30 LAB — TROPONIN I
TROPONIN I: 0.07 ng/mL — AB (ref <0.03)
Troponin I: 0.09 ng/mL (ref <0.03)

## 2016-04-30 LAB — ECHOCARDIOGRAM COMPLETE
Height: 67 in
WEIGHTICAEL: 3996.8 [oz_av]

## 2016-04-30 NOTE — Progress Notes (Signed)
PROGRESS NOTE    Brian Parsons  [id number] DOB: February 16, 1951 DOA: 04/29/2016 PCP: [name], PA-C    Brief Narrative:  66 year old male with a history of diastolic heart failure and COPD who presents to the hospital with worsening lower extremity edema and progressive orthopnea/shortness of breath. He was found to be in decompensated heart failure and started on IV Lasix. Will likely be in the hospital several days.   Assessment & Plan:   Active Problems:   COPD (chronic obstructive pulmonary disease) (H[facility])   Elevated troponin   Acute on chronic respiratory failure ([facility])   Tobacco abuse   Essential hypertension   Polycythemia, secondary   CHF, acute on chronic ([facility])   AKI (acute kidney injury) ([employer])   CHF exacerbation ([place])   1. Acute on chronic diastolic congestive heart failure. Echocardiogram shows preserved ejection fraction with grade 2 diastolic dysfunction. Started on intravenous Lasix. Monitor intake and output. He reports that his dry weight is close to 240 pounds. Weight on admission was noted to be 249 pounds. Will hold on ACE inhibitor due to rising creatinine.   2. Elevated troponin. Likely related to congestive heart failure. No new EKG changes. No chest pain. Patient had a cardiac catheterization in 07/2015 which showed nonobstructive disease. Recommendations were for medical management. Serial troponins have not had any significant rise. No further work up planned at this time.  3. AKI. likely related to congestive heart failure. Creatinine is mildly better today. Continue diuresis and monitor renal function.  4. 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 followed by hematology. He receives intermittent phlebotomies.  5. COPD . Mild wheezing at this time. Will continue bronchodilators  6. Tobacco use. Continue nicotine patch.  7. Hypertension. Currently stable. We'll hold ACE inhibitor due to renal dysfunction.   8. Acute on chronic respiratory  failure with hypoxia. Patient normally wears oxygen only at night. He was noted to be hypoxic on room air on admission. Worsening respiratory status related to decompensated heart failure. We'll try and wean off oxygen as tolerated.   DVT prophylaxis: lovenox Code Status: full Family Communication: discussed with significant other at bedside Disposition Plan: discharge home once improved   Consultants:     Procedures:  Echo:- Normal left ventricular size and systolic function.   Grade 2 diastolic dysfunction with normal filling pressures.   Right ventricle is moderately dilated with decreased systolic   function.    IVC is dilated, RVSP is normal  Antimicrobials:      Subjective: Feels a little better. Legs still feel tight  Objective: Vitals:   04/29/16 2154 04/30/16 0614 04/30/16 0735 04/30/16 1407  BP:  110/63  (!) 97/58  Pulse:  88  81  Resp:  20  20  Temp:  98.2 F (36.8 C)  97.6 F (36.4 C)  TempSrc:  Oral  Oral  SpO2: 93% 94% 91% 95%  Weight:  113.3 kg (249 lb 12.8 oz)    Height:        Intake/Output Summary (Last 24 hours) at 04/30/16 1626 Last data filed at 04/30/16 1407  Gross per 24 hour  Intake              960 ml  Output             1350 ml  Net             -390 ml   Filed Weights   04/29/16 1206 04/29/16 1700 04/30/16 0614  Weight: 115 kg (253 lb  8 oz) 113.3 kg (249 lb 12.8 oz) 113.3 kg (249 lb 12.8 oz)    Examination:  General exam: Appears calm and comfortable  Respiratory system: mild wheeze bilaterally. Respiratory effort normal. Cardiovascular system: S1 & S2 heard, RRR. No JVD, murmurs, rubs, gallops or clicks. 1-2+ pedal edema. Gastrointestinal system: Abdomen is nondistended, soft and nontender. No organomegaly or masses felt. Normal bowel sounds heard. Central nervous system: Alert and oriented. No focal neurological deficits. Extremities: Symmetric 5 x 5 power. Skin: No rashes, lesions or ulcers Psychiatry: Judgement and insight  appear normal. Mood & affect appropriate.     Data Reviewed: I have personally reviewed following labs and imaging studies  CBC:  Recent Labs Lab 04/29/16 1233  WBC 7.2  NEUTROABS 4.4  HGB 12.0*  HCT 45.5  MCV 75.5*  PLT [id number]   Basic Metabolic Panel:  Recent Labs Lab 04/29/16 1233 04/30/16 0421  NA 138 138  K 4.3 4.4  CL 95* 95*  CO2 36* 34*  GLUCOSE 120* 117*  BUN 31* 31*  CREATININE 1.56* 1.43*  CALCIUM 8.6* 8.5*   GFR: Estimated Creatinine Clearance: 60.3 mL/min (by C-G formula based on SCr of 1.43 mg/dL (H)). Liver Function Tests:  Recent Labs Lab 04/29/16 1233  AST 17  ALT 15*  ALKPHOS 60  BILITOT 0.4  PROT 6.4*  ALBUMIN 3.5   No results for input(s): LIPASE, AMYLASE in the last 168 hours. No results for input(s): AMMONIA in the last 168 hours. Coagulation Profile: No results for input(s): INR, PROTIME in the last 168 hours. Cardiac Enzymes:  Recent Labs Lab 04/29/16 1233 04/29/16 1656 04/30/16 0100 04/30/16 0421  TROPONINI 0.11* 0.10* 0.09* 0.07*   BNP (last 3 results) No results for input(s): PROBNP in the last 8760 hours. HbA1C: No results for input(s): HGBA1C in the last 72 hours. CBG: No results for input(s): GLUCAP in the last 168 hours. Lipid Profile: No results for input(s): CHOL, HDL, LDLCALC, TRIG, CHOLHDL, LDLDIRECT in the last 72 hours. Thyroid Function Tests: No results for input(s): TSH, T4TOTAL, FREET4, T3FREE, THYROIDAB in the last 72 hours. Anemia Panel: No results for input(s): VITAMINB12, FOLATE, FERRITIN, TIBC, IRON, RETICCTPCT in the last 72 hours. Sepsis Labs: No results for input(s): PROCALCITON, LATICACIDVEN in the last 168 hours.  No results found for this or any previous visit (from the past 240 hour(s)).       Radiology Studies: Dg Chest 2 View  Result Date: 04/29/2016 CLINICAL DATA:  SOB and swelling from knees down, SOB worsening last night, hx of COPD and CHF EXAM: CHEST  2 VIEW COMPARISON:   02/27/2016 FINDINGS: Cardiac silhouette is mildly enlarged. No mediastinal or hilar masses. No evidence of adenopathy. There are prominent bronchovascular markings most evident in the lower lungs. There is no lung consolidation to suggest pneumonia. No pulmonary edema. No pleural effusion pneumothorax. There has been a previous resection of the distal right clavicle, stable. IMPRESSION: No acute cardiopulmonary disease Electronically Signed   By: Lajean Manes M.D.   On: 04/29/2016 12:54        Scheduled Meds: . aspirin EC  81 mg Oral Daily  . carvedilol  3.125 mg Oral BID WC  . enoxaparin (LOVENOX) injection  40 mg Subcutaneous Q24H  . furosemide  80 mg Intravenous BID  . mometasone-formoterol  2 puff Inhalation BID  . nicotine  21 mg Transdermal Daily  . sodium chloride flush  3 mL Intravenous Q12H  . vitamin B-12  1,000 mcg Oral Daily  Continuous Infusions:   LOS: 1 day    Time spent: 64mins    Barrie Sigmund, MD [facility] Pager [phone]  If 7PM-7AM, please contact night-coverage [email or web address] Password [facility] 04/30/2016, 4:26 PM

## 2016-04-30 NOTE — Progress Notes (Signed)
  Echocardiogram 2D Echocardiogram has been performed.  Jennette Dubin 04/30/2016, 10:16 AM

## 2016-05-01 LAB — BASIC METABOLIC PANEL
Anion gap: 8 (ref 5–15)
BUN: 29 mg/dL — ABNORMAL HIGH (ref 6–20)
CO2: 39 mmol/L — ABNORMAL HIGH (ref 22–32)
Calcium: 8.9 mg/dL (ref 8.9–10.3)
Chloride: 91 mmol/L — ABNORMAL LOW (ref 101–111)
Creatinine, Ser: 1.33 mg/dL — ABNORMAL HIGH (ref 0.61–1.24)
GFR calc Af Amer: >60 mL/min (ref >60)
GFR calc non Af Amer: 54 mL/min — ABNORMAL LOW (ref >60)
GLUCOSE: 123 mg/dL — AB (ref 65–99)
Potassium: 4.6 mmol/L (ref 3.5–5.1)
SODIUM: 138 mmol/L (ref 135–145)

## 2016-05-01 NOTE — Progress Notes (Signed)
PROGRESS NOTE    Brian Parsons  [id number] DOB: October 14, 1950 DOA: 04/29/2016 PCP: [name], PA-C    Brief Narrative:  66 year old male with a history of diastolic heart failure and COPD who presents to the hospital with worsening lower extremity edema and progressive orthopnea/shortness of breath. He was found to be in decompensated heart failure and started on IV Lasix. Will likely be in the hospital several days.   Assessment & Plan:   Active Problems:   COPD (chronic obstructive pulmonary disease) (HCC)   Elevated troponin   Acute on chronic respiratory failure (HCC)   Tobacco abuse   Essential hypertension   Polycythemia, secondary   CHF, acute on chronic (HCC)   AKI (acute kidney injury) ([place])   CHF exacerbation ([place])   1. Acute on chronic diastolic congestive heart failure. Echocardiogram shows preserved ejection fraction with grade 2 diastolic dysfunction. Started on intravenous Lasix. Monitor intake and output. He reports that his dry weight is close to 240 pounds. Weight on admission was noted to be 249 pounds. So significant drop in weight thus far. He is making urine. Will continue with Iv diuresis. Will hold on ACE inhibitor due to rising creatinine.   2. Elevated troponin. Likely related to congestive heart failure. No new EKG changes. No chest pain. Patient had a cardiac catheterization in 07/2015 which showed nonobstructive disease. Recommendations were for medical management. Serial troponins have not had any significant rise. No further work up planned at this time.  3. AKI. likely related to congestive heart failure. Creatinine continues to improve. Continue diuresis and monitor renal function.  4. 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 followed by hematology. He receives intermittent phlebotomies.  5. COPD . Mild wheezing at this time. Will continue bronchodilators  6. Tobacco use. Continue nicotine patch.  7. Hypertension. Currently stable.  We'll hold ACE inhibitor due to renal dysfunction.   8. Acute on chronic respiratory failure with hypoxia. Patient normally wears oxygen only at night. He was noted to be hypoxic on room air on admission. Worsening respiratory status related to decompensated heart failure. We'll try and wean off oxygen as tolerated.   DVT prophylaxis: lovenox Code Status: full Family Communication: discussed with significant other at bedside Disposition Plan: discharge home once improved   Consultants:     Procedures:  Echo:- Normal left ventricular size and systolic function.   Grade 2 diastolic dysfunction with normal filling pressures.   Right ventricle is moderately dilated with decreased systolic   function.    IVC is dilated, RVSP is normal  Antimicrobials:      Subjective: Wheezing improving. Still complains of LE edema  Objective: Vitals:   04/30/16 2158 05/01/16 0608 05/01/16 0911 05/01/16 1343  BP: 120/71 121/68  113/70  Pulse: 89 88  94  Resp: 20 20  20   Temp: 98.7 F (37.1 C) 98.5 F (36.9 C)  99.2 F (37.3 C)  TempSrc: Oral Oral  Oral  SpO2: 93% 94% (!) 89% 93%  Weight:  113.2 kg (249 lb 9.3 oz)    Height:        Intake/Output Summary (Last 24 hours) at 05/01/16 1717 Last data filed at 05/01/16 1300  Gross per 24 hour  Intake              720 ml  Output             2100 ml  Net            -1380 ml  Filed Weights   04/29/16 1700 04/30/16 0614 05/01/16 0608  Weight: 113.3 kg (249 lb 12.8 oz) 113.3 kg (249 lb 12.8 oz) 113.2 kg (249 lb 9.3 oz)    Examination:  General exam: Appears calm and comfortable  Respiratory system: mild wheeze bilaterally. Respiratory effort normal. Cardiovascular system: S1 & S2 heard, RRR. No JVD, murmurs, rubs, gallops or clicks. 1-2+ pedal edema. Gastrointestinal system: Abdomen is nondistended, soft and nontender. No organomegaly or masses felt. Normal bowel sounds heard. Central nervous system: Alert and oriented. No focal  neurological deficits. Extremities: Symmetric 5 x 5 power. Skin: No rashes, lesions or ulcers Psychiatry: Judgement and insight appear normal. Mood & affect appropriate.     Data Reviewed: I have personally reviewed following labs and imaging studies  CBC:  Recent Labs Lab 04/29/16 1233  WBC 7.2  NEUTROABS 4.4  HGB 12.0*  HCT 45.5  MCV 75.5*  PLT 123XX123   Basic Metabolic Panel:  Recent Labs Lab 04/29/16 1233 04/30/16 0421 05/01/16 0523  NA 138 138 138  K 4.3 4.4 4.6  CL 95* 95* 91*  CO2 36* 34* 39*  GLUCOSE 120* 117* 123*  BUN 31* 31* 29*  CREATININE 1.56* 1.43* 1.33*  CALCIUM 8.6* 8.5* 8.9   GFR: Estimated Creatinine Clearance: 64.7 mL/min (by C-G formula based on SCr of 1.33 mg/dL (H)). Liver Function Tests:  Recent Labs Lab 04/29/16 1233  AST 17  ALT 15*  ALKPHOS 60  BILITOT 0.4  PROT 6.4*  ALBUMIN 3.5   No results for input(s): LIPASE, AMYLASE in the last 168 hours. No results for input(s): AMMONIA in the last 168 hours. Coagulation Profile: No results for input(s): INR, PROTIME in the last 168 hours. Cardiac Enzymes:  Recent Labs Lab 04/29/16 1233 04/29/16 1656 04/30/16 0100 04/30/16 0421  TROPONINI 0.11* 0.10* 0.09* 0.07*   BNP (last 3 results) No results for input(s): PROBNP in the last 8760 hours. HbA1C: No results for input(s): HGBA1C in the last 72 hours. CBG: No results for input(s): GLUCAP in the last 168 hours. Lipid Profile: No results for input(s): CHOL, HDL, LDLCALC, TRIG, CHOLHDL, LDLDIRECT in the last 72 hours. Thyroid Function Tests: No results for input(s): TSH, T4TOTAL, FREET4, T3FREE, THYROIDAB in the last 72 hours. Anemia Panel: No results for input(s): VITAMINB12, FOLATE, FERRITIN, TIBC, IRON, RETICCTPCT in the last 72 hours. Sepsis Labs: No results for input(s): PROCALCITON, LATICACIDVEN in the last 168 hours.  No results found for this or any previous visit (from the past 240 hour(s)).       Radiology  Studies: No results found.      Scheduled Meds: . aspirin EC  81 mg Oral Daily  . enoxaparin (LOVENOX) injection  40 mg Subcutaneous Q24H  . furosemide  80 mg Intravenous BID  . mometasone-formoterol  2 puff Inhalation BID  . nicotine  21 mg Transdermal Daily  . sodium chloride flush  3 mL Intravenous Q12H  . vitamin B-12  1,000 mcg Oral Daily   Continuous Infusions:   LOS: 2 days    Time spent: 25mins    MEMON,JEHANZEB, MD [facility] Pager [phone]  If 7PM-7AM, please contact night-coverage [email or web address] Password [facility] 05/01/2016, 5:17 PM

## 2016-05-01 NOTE — Plan of Care (Signed)
Problem: Food- and Nutrition-Related Knowledge Deficit (NB-1.1) Goal: Nutrition education Formal process to instruct or train a patient/client in a skill or to impart knowledge to help patients/clients voluntarily manage or modify food choices and eating behavior to maintain or improve health. Outcome: Adequate for Discharge Nutrition Education Note  RD consulted for nutrition education regarding acute  CHF.  RD provided "Eating Plan for Heart Failure" handout. Reviewed patient's dietary recall. Provided examples on ways to decrease sodium intake in diet. Discouraged intake of processed foods and use of salt shaker. Encouraged fresh fruits and vegetables as well as whole grain sources of carbohydrates to maximize fiber intake.   RD discussed why it is important for patient to adhere to diet recommendations, and emphasized the role of fluids, foods to avoid, and importance of weighing self daily. Teach back method used.    Expect good compliance. [name] has good understanding of the basic principles of how to limit daily dietary sodium intake. He reads food labels and was able to identify an item that would be considered high in sodium. He eats mostly at home so that he can control the amount of salt he takes in. He also weighs himself every morning.   Body mass index is 39.09 kg/m. Pt meets criteria for obese class II based on current BMI.  Current diet order is Heart Healthy, patient is consuming approximately 75-100% of meals at this time. Labs and medications reviewed.   Recommend: Weight loss of 5-10% would be beneficial for this gentleman when he is ready to make further lifestyle changes.  Brian Cater MS,RD,CSG,LDN Office: [phone] Pager: [phone]

## 2016-05-01 NOTE — Care Management Note (Signed)
Case Management Note  Patient Details  Name: Brian Parsons MRN: [id number] Date of Birth: [birth year]  Subjective/Objective:      Patient adm from home CHF. He is from home, ind with ADL's. He has a PCP, still drives to appointments, reports no issues affording medications. He has a oxygen concentrator and portable tanks at home. Currently wears oxygen at night.         Action/Plan:Anticipate DC home with self care. No CM needs.    Expected Discharge Date:       05/02/2016           Expected Discharge Plan:  Home/Self Care  In-House Referral:  NA  Discharge planning Services  CM Consult  Post Acute Care Choice:  NA Choice offered to:  NA  DME Arranged:    DME Agency:     HH Arranged:    HH Agency:     Status of Service:  Completed, signed off  If discussed at [employer] of Stay Meetings, dates discussed:    Additional Comments:  Lyzette Reinhardt, Chauncey Reading, RN 05/01/2016, 11:55 AM

## 2016-05-02 DIAGNOSIS — Z720 Tobacco use: Secondary | ICD-10-CM

## 2016-05-02 LAB — BASIC METABOLIC PANEL
ANION GAP: 7 (ref 5–15)
BUN: 27 mg/dL — ABNORMAL HIGH (ref 6–20)
CO2: 42 mmol/L — ABNORMAL HIGH (ref 22–32)
Calcium: 8.8 mg/dL — ABNORMAL LOW (ref 8.9–10.3)
Chloride: 89 mmol/L — ABNORMAL LOW (ref 101–111)
Creatinine, Ser: 1.21 mg/dL (ref 0.61–1.24)
GFR calc Af Amer: >60 mL/min (ref >60)
Glucose, Bld: 110 mg/dL — ABNORMAL HIGH (ref 65–99)
POTASSIUM: 4 mmol/L (ref 3.5–5.1)
SODIUM: 138 mmol/L (ref 135–145)

## 2016-05-02 LAB — MAGNESIUM: MAGNESIUM: 1.8 mg/dL (ref 1.7–2.4)

## 2016-05-02 MED ORDER — CYCLOBENZAPRINE HCL 10 MG PO TABS
5.0000 mg | ORAL_TABLET | Freq: Three times a day (TID) | ORAL | Status: DC | PRN
  Administered 2016-05-03: 5 mg via ORAL
  Filled 2016-05-02: qty 1

## 2016-05-02 MED ORDER — DIPHENHYDRAMINE HCL 25 MG PO CAPS
50.0000 mg | ORAL_CAPSULE | Freq: Every evening | ORAL | Status: DC | PRN
  Administered 2016-05-03: 50 mg via ORAL
  Filled 2016-05-02: qty 2

## 2016-05-02 NOTE — Progress Notes (Addendum)
PROGRESS NOTE    Brian Parsons  [id number] DOB: 08/04/1950 DOA: 04/29/2016 PCP: [name], PA-C    Brief Narrative:  66 year old male with a history of diastolic heart failure and COPD who presents to the hospital with worsening lower extremity edema and progressive orthopnea/shortness of breath. He was found to be in decompensated heart failure and started on IV Lasix. Will likely be in the hospital several days.   Assessment & Plan:   Active Problems:   COPD (chronic obstructive pulmonary disease) (HCC)   Elevated troponin   Acute on chronic respiratory failure (HCC)   Tobacco abuse   Essential hypertension   Polycythemia, secondary   CHF, acute on chronic (HCC)   AKI (acute kidney injury) ([place])   CHF exacerbation ([place])   1. Acute on chronic diastolic congestive heart failure. Echocardiogram shows preserved ejection fraction with grade 2 diastolic dysfunction. Started on intravenous Lasix. Monitor intake and output. He reports that his dry weight is close to 230 pounds. Weight on admission was noted to be 249 pounds. His weight is down 6lbs since admission. He is making urine. Will continue with Iv diuresis. Will hold on ACE inhibitor due to rising creatinine.  Keep legs elevated  2. Elevated troponin. Likely related to congestive heart failure. No new EKG changes. No chest pain. Patient had a cardiac catheterization in 07/2015 which showed nonobstructive disease. Recommendations were for medical management. Serial troponins have not had any significant rise. No further work up planned at this time.  3. AKI. likely related to congestive heart failure. He presented with a creatinine of 1.5 and a baseline creatinine of 1.1. Creatinine continues to improve. Continue diuresis and monitor renal function.  4. 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 followed by hematology. He receives intermittent phlebotomies.  5. COPD . Mild wheezing at this time. Will continue  bronchodilators  6. Tobacco use. Continue nicotine patch.  7. Hypertension. Currently stable. We'll hold ACE inhibitor due to renal dysfunction.   8. Acute on chronic respiratory failure with hypoxia. Patient normally wears oxygen only at night. He was noted to be hypoxic on room air on admission. Worsening respiratory status related to decompensated heart failure/copd. We'll try and wean off oxygen as tolerated.   DVT prophylaxis: lovenox Code Status: full Family Communication: discussed with significant other at bedside Disposition Plan: discharge home once improved   Consultants:     Procedures:  Echo:- Normal left ventricular size and systolic function.   Grade 2 diastolic dysfunction with normal filling pressures.   Right ventricle is moderately dilated with decreased systolic   function.    IVC is dilated, RVSP is normal  Antimicrobials:      Subjective: Legs feel less tight than they were on admission. Shortness of breath improving.  Objective: Vitals:   05/01/16 1343 05/01/16 2118 05/02/16 0557 05/02/16 0921  BP: 113/70  104/68   Pulse: 94  94   Resp: 20  20   Temp: 99.2 F (37.3 C)  98.4 F (36.9 C)   TempSrc: Oral  Oral   SpO2: 93% 97% 94% 95%  Weight:   110.5 kg (243 lb 11.2 oz)   Height:        Intake/Output Summary (Last 24 hours) at 05/02/16 1333 Last data filed at 05/02/16 0900  Gross per 24 hour  Intake              240 ml  Output             1100  ml  Net             -860 ml   Filed Weights   04/30/16 0614 05/01/16 0608 05/02/16 0557  Weight: 113.3 kg (249 lb 12.8 oz) 113.2 kg (249 lb 9.3 oz) 110.5 kg (243 lb 11.2 oz)    Examination:  General exam: Appears calm and comfortable  Respiratory system: clear bilaterally. Respiratory effort normal. Cardiovascular system: S1 & S2 heard, RRR. No JVD, murmurs, rubs, gallops or clicks. 1-2+ pedal edema. Gastrointestinal system: Abdomen is nondistended, soft and nontender. No organomegaly or  masses felt. Normal bowel sounds heard. Central nervous system: Alert and oriented. No focal neurological deficits. Extremities: Symmetric 5 x 5 power. Skin: No rashes, lesions or ulcers Psychiatry: Judgement and insight appear normal. Mood & affect appropriate.     Data Reviewed: I have personally reviewed following labs and imaging studies  CBC:  Recent Labs Lab 04/29/16 1233  WBC 7.2  NEUTROABS 4.4  HGB 12.0*  HCT 45.5  MCV 75.5*  PLT 123XX123   Basic Metabolic Panel:  Recent Labs Lab 04/29/16 1233 04/30/16 0421 05/01/16 0523 05/02/16 0507  NA 138 138 138 138  K 4.3 4.4 4.6 4.0  CL 95* 95* 91* 89*  CO2 36* 34* 39* 42*  GLUCOSE 120* 117* 123* 110*  BUN 31* 31* 29* 27*  CREATININE 1.56* 1.43* 1.33* 1.21  CALCIUM 8.6* 8.5* 8.9 8.8*  MG  --   --   --  1.8   GFR: Estimated Creatinine Clearance: 70.3 mL/min (by C-G formula based on SCr of 1.21 mg/dL). Liver Function Tests:  Recent Labs Lab 04/29/16 1233  AST 17  ALT 15*  ALKPHOS 60  BILITOT 0.4  PROT 6.4*  ALBUMIN 3.5   No results for input(s): LIPASE, AMYLASE in the last 168 hours. No results for input(s): AMMONIA in the last 168 hours. Coagulation Profile: No results for input(s): INR, PROTIME in the last 168 hours. Cardiac Enzymes:  Recent Labs Lab 04/29/16 1233 04/29/16 1656 04/30/16 0100 04/30/16 0421  TROPONINI 0.11* 0.10* 0.09* 0.07*   BNP (last 3 results) No results for input(s): PROBNP in the last 8760 hours. HbA1C: No results for input(s): HGBA1C in the last 72 hours. CBG: No results for input(s): GLUCAP in the last 168 hours. Lipid Profile: No results for input(s): CHOL, HDL, LDLCALC, TRIG, CHOLHDL, LDLDIRECT in the last 72 hours. Thyroid Function Tests: No results for input(s): TSH, T4TOTAL, FREET4, T3FREE, THYROIDAB in the last 72 hours. Anemia Panel: No results for input(s): VITAMINB12, FOLATE, FERRITIN, TIBC, IRON, RETICCTPCT in the last 72 hours. Sepsis Labs: No results for  input(s): PROCALCITON, LATICACIDVEN in the last 168 hours.  No results found for this or any previous visit (from the past 240 hour(s)).       Radiology Studies: No results found.      Scheduled Meds: . aspirin EC  81 mg Oral Daily  . enoxaparin (LOVENOX) injection  40 mg Subcutaneous Q24H  . furosemide  80 mg Intravenous BID  . mometasone-formoterol  2 puff Inhalation BID  . nicotine  21 mg Transdermal Daily  . sodium chloride flush  3 mL Intravenous Q12H  . vitamin B-12  1,000 mcg Oral Daily   Continuous Infusions:   LOS: 3 days    Time spent: 81mins    AmeLie Hollars, MD [facility] Pager [phone]  If 7PM-7AM, please contact night-coverage [email or web address] Password [email or web address] 05/02/2016, 1:33 PM

## 2016-05-03 DIAGNOSIS — I5033 Acute on chronic diastolic (congestive) heart failure: Secondary | ICD-10-CM

## 2016-05-03 DIAGNOSIS — R748 Abnormal levels of other serum enzymes: Secondary | ICD-10-CM

## 2016-05-03 DIAGNOSIS — I10 Essential (primary) hypertension: Secondary | ICD-10-CM

## 2016-05-03 LAB — BASIC METABOLIC PANEL
ANION GAP: 9 (ref 5–15)
BUN: 23 mg/dL — ABNORMAL HIGH (ref 6–20)
CALCIUM: 9 mg/dL (ref 8.9–10.3)
CHLORIDE: 86 mmol/L — AB (ref 101–111)
CO2: 42 mmol/L — ABNORMAL HIGH (ref 22–32)
CREATININE: 1.2 mg/dL (ref 0.61–1.24)
GFR calc Af Amer: >60 mL/min (ref >60)
GFR calc non Af Amer: >60 mL/min (ref >60)
Glucose, Bld: 102 mg/dL — ABNORMAL HIGH (ref 65–99)
Potassium: 4.3 mmol/L (ref 3.5–5.1)
SODIUM: 137 mmol/L (ref 135–145)

## 2016-05-03 NOTE — Progress Notes (Signed)
PROGRESS NOTE    Brian Parsons  [id number] DOB: June 28, 1950 DOA: 04/29/2016 PCP: [name], PA-C    Brief Narrative:  65 year old male with a history of diastolic heart failure and COPD who presented to the hospital with worsening lower extremity edema and progressive orthopnea/shortness of breath. He was found to be in decompensated heart failure and started on IV Lasix. He has felt better, but still has significant leg edema.  He lives at home with his fiance, and has home oxygen.   Assessment & Plan:   Active Problems:   COPD (chronic obstructive pulmonary disease) (HCC)   Elevated troponin   Acute on chronic respiratory failure (HCC)   Tobacco abuse   Essential hypertension   Polycythemia, secondary   CHF, acute on chronic (HCC)   AKI (acute kidney injury) ([place])   CHF exacerbation ([place])    1. Acute on chronic diastolic congestive heart failure. Echocardiogram shows preserved ejection fraction with grade 2 diastolic dysfunction. Started on intravenous Lasix. Monitor intake and output. He reports that his dry weight is close to 230 pounds. Weight on admission was noted to be 249 pounds. His weight is down 6lbs since admission. He is making urine. Will continue with Iv diuresis. Will hold on ACE inhibitor due to rising creatinine.  Keep legs elevated  2. Elevated troponin. Likely related to congestive heart failure. No new EKG changes. No chest pain. Patient had a cardiac catheterization in 07/2015 which showed nonobstructive disease. Recommendations were for medical management. Serial troponins have not had any significant rise. No further work up planned at this time.  3. AKI. likely related to congestive heart failure. He presented with a creatinine of 1.5 and a baseline creatinine of 1.1. Creatinine continues to improve. Continue diuresis and monitor renal function.  4. 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 followed by hematology. He receives intermittent  phlebotomies.  5. COPD . Mild wheezing at this time. Will continue bronchodilators  6. Tobacco use. Continue nicotine patch.  7. Hypertension. Currently stable. We'll hold ACE inhibitor due to renal dysfunction.   8. Acute on chronic respiratory failure with hypoxia. Patient normally wears oxygen only at night. He was noted to be hypoxic on room air on admission. Worsening respiratory status related to decompensated heart failure/copd. We'll try and wean off oxygen as tolerated.  DVT prophylaxis: Lovenox.  Code Status: FULL CODE.  Family Communication: none today.  Disposition Plan: to home.   Consultants:   None.   Procedures:  Echo:- Normal left ventricular size and systolic function. Grade 2 diastolic dysfunction with normal filling pressures. Right ventricle is moderately dilated with decreased systolic function.  IVC is dilated, RVSP is normal  Antimicrobials: Anti-infectives    None       Subjective: Confinue to feel better.  Leg edema persisted.    Objective: Vitals:   05/02/16 2046 05/02/16 2135 05/03/16 0430 05/03/16 0741  BP:  115/72 123/78   Pulse:  87 (!) 107   Resp:  16 16   Temp:  98.6 F (37 C) 98.8 F (37.1 C)   TempSrc:  Oral Oral   SpO2: 92% 95% 95% 92%  Weight:   107 kg (235 lb 14.3 oz)   Height:        Intake/Output Summary (Last 24 hours) at 05/03/16 1243 Last data filed at 05/03/16 0900  Gross per 24 hour  Intake              380 ml  Output  700 ml  Net             -320 ml   Filed Weights   05/01/16 0608 05/02/16 0557 05/03/16 0430  Weight: 113.2 kg (249 lb 9.3 oz) 110.5 kg (243 lb 11.2 oz) 107 kg (235 lb 14.3 oz)    Examination:  General exam: Appears calm and comfortable  Respiratory system: still has bilateral rales at both bases.  Cardiovascular system: S1 & S2 heard, RRR. No JVD, murmurs, rubs, gallops or clicks. No pedal edema. Gastrointestinal system: Abdomen is nondistended, soft and nontender.  No organomegaly or masses felt. Normal bowel sounds heard. Central nervous system: Alert and oriented. No focal neurological deficits. Extremities: Symmetric 5 x 5 power. Skin: No rashes, lesions or ulcers Psychiatry: Judgement and insight appear normal. Mood & affect appropriate.   Data Reviewed: I have personally reviewed following labs and imaging studies  CBC:  Recent Labs Lab 04/29/16 1233  WBC 7.2  NEUTROABS 4.4  HGB 12.0*  HCT 45.5  MCV 75.5*  PLT 123XX123   Basic Metabolic Panel:  Recent Labs Lab 04/29/16 1233 04/30/16 0421 05/01/16 0523 05/02/16 0507 05/03/16 0458  NA 138 138 138 138 137  K 4.3 4.4 4.6 4.0 4.3  CL 95* 95* 91* 89* 86*  CO2 36* 34* 39* 42* 42*  GLUCOSE 120* 117* 123* 110* 102*  BUN 31* 31* 29* 27* 23*  CREATININE 1.56* 1.43* 1.33* 1.21 1.20  CALCIUM 8.6* 8.5* 8.9 8.8* 9.0  MG  --   --   --  1.8  --    GFR: Estimated Creatinine Clearance: 69.7 mL/min (by C-G formula based on SCr of 1.2 mg/dL). Liver Function Tests:  Recent Labs Lab 04/29/16 1233  AST 17  ALT 15*  ALKPHOS 60  BILITOT 0.4  PROT 6.4*  ALBUMIN 3.5    Recent Labs Lab 04/29/16 1233 04/29/16 1656 04/30/16 0100 04/30/16 0421  TROPONINI 0.11* 0.10* 0.09* 0.07*   Radiology Studies: No results found.  Scheduled Meds: . aspirin EC  81 mg Oral Daily  . enoxaparin (LOVENOX) injection  40 mg Subcutaneous Q24H  . furosemide  80 mg Intravenous BID  . mometasone-formoterol  2 puff Inhalation BID  . nicotine  21 mg Transdermal Daily  . sodium chloride flush  3 mL Intravenous Q12H  . vitamin B-12  1,000 mcg Oral Daily   Continuous Infusions:   LOS: 4 days   Collins Kerby, MD FACP Hospitalist.   If 7PM-7AM, please contact night-coverage [email or web address] Password [facility] 05/03/2016, 12:43 PM

## 2016-05-04 DIAGNOSIS — J438 Other emphysema: Secondary | ICD-10-CM

## 2016-05-04 DIAGNOSIS — D751 Secondary polycythemia: Secondary | ICD-10-CM

## 2016-05-04 LAB — BASIC METABOLIC PANEL
Anion gap: 12 (ref 5–15)
BUN: 23 mg/dL — AB (ref 6–20)
CHLORIDE: 84 mmol/L — AB (ref 101–111)
CO2: 44 mmol/L — ABNORMAL HIGH (ref 22–32)
CREATININE: 1.21 mg/dL (ref 0.61–1.24)
Calcium: 9.2 mg/dL (ref 8.9–10.3)
GFR calc Af Amer: >60 mL/min (ref >60)
GFR calc non Af Amer: >60 mL/min (ref >60)
Glucose, Bld: 100 mg/dL — ABNORMAL HIGH (ref 65–99)
Potassium: 3.8 mmol/L (ref 3.5–5.1)
SODIUM: 140 mmol/L (ref 135–145)

## 2016-05-04 NOTE — Progress Notes (Signed)
Late Entry:  Patient complained of pain and not being able to urinate.  He had urinated 175 via urinal.  Bladder scanned him and he had approx 500 cc of urine still in the bladder.  Notified Dr. Marin Comment and recommended a in and out cath.  He said that order would be okay.  Went down to discuss POC with the patient and he stated he rather wait and he had urinated about 350 cc of urine in the urinal.  Re scanned him and he had about 200 cc left.  I voiced to him I was about to give him lasix and he stated he wanted to see how that worked.  I stated that I would continue to monitor him.  1715 Patient is voiding and has not had any further complaints.

## 2016-05-04 NOTE — Progress Notes (Signed)
PROGRESS NOTE    Brian Parsons  [id number] DOB: Jun 30, 1950 DOA: 04/29/2016 PCP: [name], PA-C    Brief Narrative:  66 year old male with a history of diastolic heart failure and COPD who presented to the hospital with worsening lower extremity edema and progressive orthopnea/shortness of breath. He was found to be in decompensated heart failure and started on IV Lasix. He has felt better, but still has significant leg edema.  He lives at home with his fiance, and has home oxygen.   Assessment & Plan:   Active Problems:   COPD (chronic obstructive pulmonary disease) (HCC)   Elevated troponin   Acute on chronic respiratory failure (HCC)   Tobacco abuse   Essential hypertension   Polycythemia, secondary   CHF, acute on chronic (HCC)   AKI (acute kidney injury) ([place])   CHF exacerbation ([place])    1. Acute on chronic diastolic congestive heart failure. Echocardiogram shows preserved ejection fraction with grade 2 diastolic dysfunction. Started on intravenous Lasix. Monitor intake and output. He reports that his dry weight is close to 230 pounds. Weight on admission was noted to be 249 pounds. His weight is down 6lbs since admission. He is making urine. Will continue with Iv diuresis. Will hold on ACE inhibitor due to rising creatinine. Keep legs elevated  2. Elevated troponin. Likely related to congestive heart failure. No new EKG changes. No chest pain. Patient had a cardiac catheterization in 07/2015 which showed nonobstructive disease. Recommendations were for medical management. Serial troponins have not had any significant rise. No further work up planned at this time.  3. AKI. likely related to congestive heart failure. He presented with a creatinine of 1.5 and a baseline creatinine of 1.1. Creatinine continues to improve. Continue diuresis and monitor renal function.  4. 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 followed by hematology. He receives intermittent  phlebotomies.  5. COPD . Mild wheezing at this time. Will continue bronchodilators  6. Tobacco use. Continue nicotine patch.  7. Hypertension. Currently stable. We'll hold ACE inhibitor due to renal dysfunction.   8. Acute on chronic respiratory failure with hypoxia. Patient normally wears oxygen only at night. He was noted to be hypoxic on room air on admission. Worsening respiratory status related to decompensated heart failure/copd. We'll try and wean off oxygen as tolerated.  DVT prophylaxis: Lovenox.  Code Status: FULL CODE.  Family Communication: none today.  Disposition Plan: to home.   Consultants:   None.   Procedures:  Echo:- Normal left ventricular size and systolic function. Grade 2 diastolic dysfunction with normal filling pressures. Right ventricle is moderately dilated with decreased systolic function.  IVC is dilated, RVSP is normal   Antimicrobials: Anti-infectives    None      Subjective: Feeling better and is anxious to go home.   Objective: Vitals:   05/03/16 2133 05/03/16 2149 05/04/16 0500 05/04/16 0911  BP: 115/67  107/66   Pulse: 89  75   Resp: 16  16   Temp: 97.5 F (36.4 C)  98.2 F (36.8 C)   TempSrc: Oral  Oral   SpO2: 92% 95% 96% 96%  Weight:   107.8 kg (237 lb 10.5 oz)   Height:        Intake/Output Summary (Last 24 hours) at 05/04/16 1639 Last data filed at 05/04/16 1419  Gross per 24 hour  Intake              963 ml  Output  1400 ml  Net             -437 ml   Filed Weights   05/02/16 0557 05/03/16 0430 05/04/16 0500  Weight: 110.5 kg (243 lb 11.2 oz) 107 kg (235 lb 14.3 oz) 107.8 kg (237 lb 10.5 oz)    Examination:  General exam: Appears calm and comfortable  Respiratory system: Clear to auscultation. Respiratory effort normal. Cardiovascular system: S1 & S2 heard, RRR. No JVD, murmurs, rubs, gallops or clicks. No pedal edema. Gastrointestinal system: Abdomen is nondistended, soft and  nontender. No organomegaly or masses felt. Normal bowel sounds heard. Central nervous system: Alert and oriented. No focal neurological deficits. Extremities: Symmetric 5 x 5 power. Skin: No rashes, lesions or ulcers Psychiatry: Judgement and insight appear normal. Mood & affect appropriate.   Data Reviewed: I have personally reviewed following labs and imaging studies  CBC:  Recent Labs Lab 04/29/16 1233  WBC 7.2  NEUTROABS 4.4  HGB 12.0*  HCT 45.5  MCV 75.5*  PLT 123XX123   Basic Metabolic Panel:  Recent Labs Lab 04/30/16 0421 05/01/16 0523 05/02/16 0507 05/03/16 0458 05/04/16 0529  NA 138 138 138 137 140  K 4.4 4.6 4.0 4.3 3.8  CL 95* 91* 89* 86* 84*  CO2 34* 39* 42* 42* 44*  GLUCOSE 117* 123* 110* 102* 100*  BUN 31* 29* 27* 23* 23*  CREATININE 1.43* 1.33* 1.21 1.20 1.21  CALCIUM 8.5* 8.9 8.8* 9.0 9.2  MG  --   --  1.8  --   --    GFR: Estimated Creatinine Clearance: 69.4 mL/min (by C-G formula based on SCr of 1.21 mg/dL). Liver Function Tests:  Recent Labs Lab 04/29/16 1233  AST 17  ALT 15*  ALKPHOS 60  BILITOT 0.4  PROT 6.4*  ALBUMIN 3.5   Cardiac Enzymes:  Recent Labs Lab 04/29/16 1233 04/29/16 1656 04/30/16 0100 04/30/16 0421  TROPONINI 0.11* 0.10* 0.09* 0.07*   Scheduled Meds: . aspirin EC  81 mg Oral Daily  . enoxaparin (LOVENOX) injection  40 mg Subcutaneous Q24H  . furosemide  80 mg Intravenous BID  . mometasone-formoterol  2 puff Inhalation BID  . nicotine  21 mg Transdermal Daily  . sodium chloride flush  3 mL Intravenous Q12H  . vitamin B-12  1,000 mcg Oral Daily   Continuous Infusions:   LOS: 5 days   Reynaldo Rossman, MD FACP Hospitalist.   If 7PM-7AM, please contact night-coverage [email or web address] Password [facility] 05/04/2016, 4:39 PM

## 2016-05-05 DIAGNOSIS — J449 Chronic obstructive pulmonary disease, unspecified: Secondary | ICD-10-CM

## 2016-05-05 DIAGNOSIS — I509 Heart failure, unspecified: Secondary | ICD-10-CM

## 2016-05-05 NOTE — Discharge Summary (Signed)
Physician Discharge Summary  Brian Parsons [id number] DOB: [birth year] DOA: 04/29/2016  PCP: [name], PA-C  Admit date: 04/29/2016 Discharge date: 05/05/2016  Admitted From: Home.  Disposition: Home.   Recommendations for Outpatient Follow-up:  1. Follow up with PCP in 1-2 weeks  Home Health: None.  Equipment/Devices: Home oxygen.  Discharge Condition: Stable.  No SOB, swelling of lower ext improved.  Cr stable with diuresis.  CODE STATUS: FULL CODE.  Diet recommendation: Cardiac diet.   Brief/Interim Summary: Patient was admitted for CHF by Dr Jolaine Artist on Apr 29, 2016.  As per his H and P:  " Brian Parsons is a 66 y.o. male with medical history significant of congestive heart failure on diuretics has been complaining of worsening lower extremity edema for the past 2 weeks. He reports compliance with medications, salt and fluid intake. He saw his primary care physician on Monday and torsemide was changed to Bumex. He reports he's noticed decreased urine output. No chest pain. He has a chronic cough which is unchanged. No fever. He describes orthopnea and paroxysmal nocturnal dyspnea. No nausea, vomiting, diarrhea. Patient wears oxygen during the nights. He continues .  ED Course: In the emergency room he was noted to have 2+ lower extremity edema. Troponin was mildly elevated at 0.11, which is consistent with prior levels. BNP was over 300. He was noted to be in congestive heart failure has been referred for admission.  HOSPITAL COURSE:  Patient's stay was rather uneventful.  He was given IV Lasix at 80mg  Q12, and diuresed well.  His dry weight was 230 and his admission weight was 250 lbs, so he was kept in the hospital for continued diuresis.  His Cr was monitored and remained stable.  He felt better and his pedal edema was clearly better.  For his secondary polycythemia, he has been followed by hematology, and will follow up with hematology. He does have occasional therapeutic  plebotomy.  He was having mild wheezing responded to respiratory Tx.  His modest elevation of troponin was felt to be due to CHF, and he did have a cath in April 2017, showing no obstructive disease.  He is now feeling well and anxious to go home.   He is ready for discharged.  I will continue his home dose of Bumex 3mg  per day, and he will follow up with his PCP next week.  Thank you for allowing me to participate in his care.  Good Day.   Discharge Diagnoses:  Active Problems:   COPD (chronic obstructive pulmonary disease) (HCC)   Elevated troponin   Acute on chronic respiratory failure (HCC)   Tobacco abuse   Essential hypertension   Polycythemia, secondary   CHF, acute on chronic (HCC)   AKI (acute kidney injury) ([place])   CHF exacerbation (HCC)    Discharge Instructions  Discharge Instructions    Diet - low sodium heart healthy    Complete by:  As directed    Discharge instructions    Complete by:  As directed    Weight yourself daily and be sure to let your doctor know if you gain or lost more than 5 lbs.  Take your medication as instructed.  Do not return to smoking.  See your doctor next week.   Increase activity slowly    Complete by:  As directed      Allergies as of 05/05/2016      Reactions   Amoxicillin Hives   Penicillins Hives   Has patient had  a PCN reaction causing immediate rash, facial/tongue/throat swelling, SOB or lightheadedness with hypotension: Unknown Has patient had a PCN reaction causing severe rash involving mucus membranes or skin necrosis: No Has patient had a PCN reaction that required hospitalization No Has patient had a PCN reaction occurring within the last 10 years: No If all of the above answers are "NO", then may proceed with Cephalosporin use.      Medication List    STOP taking these medications   GOODY HEADACHE PO   potassium chloride 10 MEQ tablet Commonly known as:  K-DUR,KLOR-CON     TAKE these medications   ADVAIR DISKUS 250-50  MCG/DOSE Aepb Generic drug:  Fluticasone-Salmeterol USE 1 INHALATION BY MOUTH EVERY 12 HOURS. <<RINSE MOUTH AFTER USE>>.   aspirin EC 81 MG tablet Take 81 mg by mouth daily.   bumetanide 1 MG tablet Commonly known as:  BUMEX Take 3 tablets by mouth daily.   ipratropium-albuterol 0.5-2.5 (3) MG/3ML Soln Commonly known as:  DUONEB Take 3 mLs by nebulization every 6 (six) hours. What changed:  when to take this  reasons to take this   lisinopril 20 MG tablet Commonly known as:  PRINIVIL Take 0.5 tablets (10 mg total) by mouth daily.   MAGNESIUM PO Take 1 tablet by mouth daily.   nicotine 21 mg/24hr patch Commonly known as:  NICODERM CQ Place 1 patch (21 mg total) onto the skin daily.   vitamin B-12 1000 MCG tablet Commonly known as:  CYANOCOBALAMIN Take 1,000 mcg by mouth daily.       Allergies  Allergen Reactions  . Amoxicillin Hives  . Penicillins Hives    Has patient had a PCN reaction causing immediate rash, facial/tongue/throat swelling, SOB or lightheadedness with hypotension: Unknown Has patient had a PCN reaction causing severe rash involving mucus membranes or skin necrosis: No Has patient had a PCN reaction that required hospitalization No Has patient had a PCN reaction occurring within the last 10 years: No If all of the above answers are "NO", then may proceed with Cephalosporin use.     Consultations:  None.    Procedures/Studies: Dg Chest 2 View  Result Date: 04/29/2016 CLINICAL DATA:  SOB and swelling from knees down, SOB worsening last night, hx of COPD and CHF EXAM: CHEST  2 VIEW COMPARISON:  02/27/2016 FINDINGS: Cardiac silhouette is mildly enlarged. No mediastinal or hilar masses. No evidence of adenopathy. There are prominent bronchovascular markings most evident in the lower lungs. There is no lung consolidation to suggest pneumonia. No pulmonary edema. No pleural effusion pneumothorax. There has been a previous resection of the distal right  clavicle, stable. IMPRESSION: No acute cardiopulmonary disease Electronically Signed   By: Lajean Manes M.D.   On: 04/29/2016 12:54       Subjective: Feels well. Wanted to go home.    Discharge Exam: Vitals:   05/04/16 2004 05/05/16 0601  BP: 110/68 118/72  Pulse: 74 68  Resp: 16 16  Temp: 98 F (36.7 C) 98.4 F (36.9 C)   Vitals:   05/04/16 2004 05/04/16 2105 05/05/16 0601 05/05/16 0728  BP: 110/68  118/72   Pulse: 74  68   Resp: 16  16   Temp: 98 F (36.7 C)  98.4 F (36.9 C)   TempSrc: Oral  Oral   SpO2: 95% 96% 95% 96%  Weight:   107.7 kg (237 lb 7.3 oz)   Height:        General: Pt is alert, awake, not in  acute distress Cardiovascular: RRR, S1/S2 +, no rubs, no gallops Respiratory: CTA bilaterally, no wheezing, no rhonchi Abdominal: Soft, NT, ND, bowel sounds + Extremities: no edema, no cyanosis    The results of significant diagnostics from this hospitalization (including imaging, microbiology, ancillary and laboratory) are listed below for reference.      Labs: BNP (last 3 results)  Recent Labs  07/28/15 1308 02/24/16 1024 04/29/16 1233  BNP 73.0 106.0* [id number]*   Basic Metabolic Panel:  Recent Labs Lab 04/30/16 0421 05/01/16 0523 05/02/16 0507 05/03/16 0458 05/04/16 0529  NA 138 138 138 137 140  K 4.4 4.6 4.0 4.3 3.8  CL 95* 91* 89* 86* 84*  CO2 34* 39* 42* 42* 44*  GLUCOSE 117* 123* 110* 102* 100*  BUN 31* 29* 27* 23* 23*  CREATININE 1.43* 1.33* 1.21 1.20 1.21  CALCIUM 8.5* 8.9 8.8* 9.0 9.2  MG  --   --  1.8  --   --    Liver Function Tests:  Recent Labs Lab 04/29/16 1233  AST 17  ALT 15*  ALKPHOS 60  BILITOT 0.4  PROT 6.4*  ALBUMIN 3.5   CBC:  Recent Labs Lab 04/29/16 1233  WBC 7.2  NEUTROABS 4.4  HGB 12.0*  HCT 45.5  MCV 75.5*  PLT 151   Cardiac Enzymes:  Recent Labs Lab 04/29/16 1233 04/29/16 1656 04/30/16 0100 04/30/16 0421  TROPONINI 0.11* 0.10* 0.09* 0.07*   Urinalysis    Component Value  Date/Time   COLORURINE YELLOW 11/22/2015 1403   APPEARANCEUR CLEAR 11/22/2015 1403   LABSPEC 1.010 11/22/2015 1403   PHURINE 5.5 11/22/2015 1403   GLUCOSEU NEGATIVE 11/22/2015 1403   HGBUR NEGATIVE 11/22/2015 1403   BILIRUBINUR NEGATIVE 11/22/2015 1403   KETONESUR NEGATIVE 11/22/2015 1403   PROTEINUR NEGATIVE 11/22/2015 1403   NITRITE NEGATIVE 11/22/2015 1403   LEUKOCYTESUR NEGATIVE 11/22/2015 1403    Time coordinating discharge: Over 30 minutes  SIGNED:   Orvan Falconer, MD FACP [facility] 05/05/2016, 12:14 PM   If 7PM-7AM, please contact night-coverage [email or web address] Password [email or web address]

## 2016-05-05 NOTE — Progress Notes (Signed)
Patient is to be discharged home and in stable condition. Home oxygen already established. Discharge instructions dicussed, patient verbalized understanding. No changes were made to medication regimen, and patient states he can follow-up with PCP. Patient will be escorted out by staff.   Celestia Khat, RN

## 2016-05-23 ENCOUNTER — Ambulatory Visit (HOSPITAL_COMMUNITY)
Admission: RE | Admit: 2016-05-23 | Discharge: 2016-05-23 | Disposition: A | Discharge status: Home / Self Care | Payer: Medicare Other | Source: Ambulatory Visit | Attending: Physician Assistant | Admitting: Physician Assistant

## 2016-05-23 ENCOUNTER — Other Ambulatory Visit (HOSPITAL_COMMUNITY): Payer: Self-pay | Admitting: Physician Assistant

## 2016-05-23 DIAGNOSIS — R609 Edema, unspecified: Secondary | ICD-10-CM

## 2016-05-23 DIAGNOSIS — L03116 Cellulitis of left lower limb: Principal | ICD-10-CM

## 2016-05-23 DIAGNOSIS — L03115 Cellulitis of right lower limb: Secondary | ICD-10-CM

## 2016-05-25 ENCOUNTER — Ambulatory Visit (HOSPITAL_COMMUNITY)
Admission: RE | Admit: 2016-05-25 | Discharge: 2016-05-25 | Disposition: A | Discharge status: Home / Self Care | Payer: Medicare Other | Source: Ambulatory Visit | Attending: Physician Assistant | Admitting: Physician Assistant

## 2016-05-25 DIAGNOSIS — L03116 Cellulitis of left lower limb: Secondary | ICD-10-CM | POA: Insufficient documentation

## 2016-05-25 DIAGNOSIS — L03115 Cellulitis of right lower limb: Secondary | ICD-10-CM

## 2016-05-25 DIAGNOSIS — R609 Edema, unspecified: Secondary | ICD-10-CM | POA: Diagnosis not present

## 2016-07-05 ENCOUNTER — Other Ambulatory Visit (HOSPITAL_COMMUNITY): Payer: Medicare Other

## 2016-07-05 ENCOUNTER — Ambulatory Visit (HOSPITAL_COMMUNITY): Payer: Medicare Other | Admitting: Oncology

## 2016-07-27 ENCOUNTER — Encounter (HOSPITAL_COMMUNITY): Payer: Self-pay | Admitting: *Deleted

## 2016-07-27 ENCOUNTER — Inpatient Hospital Stay (HOSPITAL_COMMUNITY)
Admission: EM | Admit: 2016-07-27 | Discharge: 2016-07-31 | DRG: 291 | Disposition: A | Discharge status: Home / Self Care | Payer: Medicare Other | Attending: Internal Medicine | Admitting: Internal Medicine

## 2016-07-27 ENCOUNTER — Emergency Department (HOSPITAL_COMMUNITY): Payer: Medicare Other

## 2016-07-27 DIAGNOSIS — J449 Chronic obstructive pulmonary disease, unspecified: Secondary | ICD-10-CM | POA: Diagnosis present

## 2016-07-27 DIAGNOSIS — Z7982 Long term (current) use of aspirin: Secondary | ICD-10-CM

## 2016-07-27 DIAGNOSIS — R778 Other specified abnormalities of plasma proteins: Secondary | ICD-10-CM | POA: Diagnosis present

## 2016-07-27 DIAGNOSIS — R0902 Hypoxemia: Secondary | ICD-10-CM | POA: Diagnosis not present

## 2016-07-27 DIAGNOSIS — Z825 Family history of asthma and other chronic lower respiratory diseases: Secondary | ICD-10-CM

## 2016-07-27 DIAGNOSIS — R7989 Other specified abnormal findings of blood chemistry: Secondary | ICD-10-CM

## 2016-07-27 DIAGNOSIS — I5031 Acute diastolic (congestive) heart failure: Secondary | ICD-10-CM | POA: Diagnosis present

## 2016-07-27 DIAGNOSIS — Z881 Allergy status to other antibiotic agents status: Secondary | ICD-10-CM

## 2016-07-27 DIAGNOSIS — Z823 Family history of stroke: Secondary | ICD-10-CM

## 2016-07-27 DIAGNOSIS — I10 Essential (primary) hypertension: Secondary | ICD-10-CM | POA: Diagnosis present

## 2016-07-27 DIAGNOSIS — J9621 Acute and chronic respiratory failure with hypoxia: Secondary | ICD-10-CM | POA: Diagnosis present

## 2016-07-27 DIAGNOSIS — I5033 Acute on chronic diastolic (congestive) heart failure: Secondary | ICD-10-CM | POA: Diagnosis present

## 2016-07-27 DIAGNOSIS — R600 Localized edema: Secondary | ICD-10-CM | POA: Diagnosis present

## 2016-07-27 DIAGNOSIS — Z9981 Dependence on supplemental oxygen: Secondary | ICD-10-CM

## 2016-07-27 DIAGNOSIS — Z880 Allergy status to penicillin: Secondary | ICD-10-CM

## 2016-07-27 DIAGNOSIS — D649 Anemia, unspecified: Secondary | ICD-10-CM | POA: Diagnosis present

## 2016-07-27 DIAGNOSIS — J441 Chronic obstructive pulmonary disease with (acute) exacerbation: Secondary | ICD-10-CM | POA: Diagnosis present

## 2016-07-27 DIAGNOSIS — J9620 Acute and chronic respiratory failure, unspecified whether with hypoxia or hypercapnia: Secondary | ICD-10-CM | POA: Diagnosis present

## 2016-07-27 DIAGNOSIS — I5032 Chronic diastolic (congestive) heart failure: Secondary | ICD-10-CM | POA: Diagnosis present

## 2016-07-27 DIAGNOSIS — I110 Hypertensive heart disease with heart failure: Principal | ICD-10-CM | POA: Diagnosis present

## 2016-07-27 LAB — BASIC METABOLIC PANEL
Anion gap: 6 (ref 5–15)
BUN: 23 mg/dL — AB (ref 6–20)
CALCIUM: 8.6 mg/dL — AB (ref 8.9–10.3)
CHLORIDE: 90 mmol/L — AB (ref 101–111)
CO2: 39 mmol/L — AB (ref 22–32)
CREATININE: 1.33 mg/dL — AB (ref 0.61–1.24)
GFR calc non Af Amer: 54 mL/min — ABNORMAL LOW (ref >60)
GLUCOSE: 94 mg/dL (ref 65–99)
Potassium: 4.1 mmol/L (ref 3.5–5.1)
Sodium: 135 mmol/L (ref 135–145)

## 2016-07-27 LAB — CBC WITH DIFFERENTIAL/PLATELET
Basophils Absolute: 0 10*3/uL (ref 0.0–0.1)
Basophils Relative: 0 %
EOS ABS: 0.2 10*3/uL (ref 0.0–0.7)
Eosinophils Relative: 3 %
HEMATOCRIT: 46.8 % (ref 39.0–52.0)
HEMOGLOBIN: 12.4 g/dL — AB (ref 13.0–17.0)
LYMPHS ABS: 2.2 10*3/uL (ref 0.7–4.0)
LYMPHS PCT: 32 %
MCH: 19.5 pg — AB (ref 26.0–34.0)
MCHC: 26.5 g/dL — AB (ref 30.0–36.0)
MCV: 73.7 fL — AB (ref 78.0–100.0)
Monocytes Absolute: 0.8 10*3/uL (ref 0.1–1.0)
Monocytes Relative: 12 %
NEUTROS PCT: 54 %
Neutro Abs: 3.6 10*3/uL (ref 1.7–7.7)
Platelets: 173 10*3/uL (ref 150–400)
RBC: 6.35 MIL/uL — AB (ref 4.22–5.81)
RDW: 20.6 % — ABNORMAL HIGH (ref 11.5–15.5)
WBC: 6.8 10*3/uL (ref 4.0–10.5)

## 2016-07-27 LAB — D-DIMER, QUANTITATIVE: D-Dimer, Quant: 0.38 ug/mL-FEU (ref 0.00–0.50)

## 2016-07-27 LAB — TROPONIN I: Troponin I: 0.04 ng/mL (ref <0.03)

## 2016-07-27 LAB — BRAIN NATRIURETIC PEPTIDE: B Natriuretic Peptide: 123 pg/mL — ABNORMAL HIGH (ref 0.0–100.0)

## 2016-07-27 MED ORDER — FUROSEMIDE 10 MG/ML IJ SOLN
40.0000 mg | Freq: Once | INTRAMUSCULAR | Status: AC
  Administered 2016-07-27: 40 mg via INTRAVENOUS
  Filled 2016-07-27: qty 4

## 2016-07-27 MED ORDER — IPRATROPIUM-ALBUTEROL 0.5-2.5 (3) MG/3ML IN SOLN
3.0000 mL | Freq: Once | RESPIRATORY_TRACT | Status: AC
  Administered 2016-07-27: 3 mL via RESPIRATORY_TRACT
  Filled 2016-07-27: qty 3

## 2016-07-27 NOTE — ED Provider Notes (Signed)
[facility] DEPT Provider Note   CSN: [id number] Arrival date & time: 07/27/16  1916  By signing my name below, I, Ethelle Lyon Long, attest that this documentation has been prepared under the direction and in the presence of Ezequiel Essex, MD. Electronically Signed: Ethelle Lyon Long, Scribe. 07/27/2016. 11:05 PM.   History   Chief Complaint Chief Complaint  Patient presents with  . Leg Swelling   The history is provided by the patient and medical records. No language interpreter was used.   HPI Comments:  Brian Parsons is a 66 y.o. male with a PMHx of COPD, CHF, and HTN, who presents to the Emergency Department complaining of constant leg swelling onset a week ago. Pt reports this swelling lasting for a week without relief from his diuretic medication. He notes an unexpected weight gain of 7 lbs during this time frame. He states seeing Dr. Corky Sox, Cardiology, earlier today who referred him to come into the ED tonight for continued Tx. No h/o MI but he does have a prior h/o heart catheterization with PCI intervention without stent placement. Pt has associated symptoms of mild SOB and a nonproductive cough. He did not try anything beside the diuretic at home for relief of his symptoms. He reports changing his Torsemide 60mg  a day to 100mg  during the last three days. He is no longer on bumex. No h/o DVT/PE. Pt wears oxygen at night at home. Pt denies abdominal pain, CP, fever, and any other complaints at this time.  Cardiologist: Dr. Corky Sox  Past Medical History:  Diagnosis Date  . Bilateral leg edema   . CHF (congestive heart failure) ([place])   . COPD (chronic obstructive pulmonary disease) ([place])   . Coronary atherosclerosis    Nonobstructive at cardiac catheterization April 2017  . Essential hypertension   . On home O2    2L N/C  . Polycythemia     Patient Active Problem List   Diagnosis Date Noted  . CHF, acute on chronic ([place]) 04/29/2016  . AKI (acute kidney injury) ([place])  04/29/2016  . CHF exacerbation ([place]) 04/29/2016  . CHF (congestive heart failure) ([place]) 02/24/2016  . Exertional dyspnea   . Abnormal nuclear stress test 08/04/2015  . Chronic diastolic heart failure ([place]) 08/04/2015  . Essential hypertension 08/04/2015  . Polycythemia, secondary 08/04/2015  . COPD exacerbation ([place]) 08/04/2015  . Tobacco abuse 08/02/2015  . Acute on chronic renal insufficiency 07/28/2015  . Acute diastolic CHF (congestive heart failure) ([place]) 07/28/2015  . Acute on chronic respiratory failure ([place]) 08/04/2014  . COPD (chronic obstructive pulmonary disease) ([place]) 08/03/2014  . Hypoxia 08/03/2014  . Lower extremity edema 08/03/2014  . Elevated troponin 08/03/2014  . Dyspnea 08/03/2014    Past Surgical History:  Procedure Laterality Date  . CARDIAC CATHETERIZATION N/A 08/10/2015   Procedure: Left Heart Cath and Coronary Angiography;  Surgeon: Troy Sine, MD;  Location: [facility];  Service: Cardiovascular;  Laterality: N/A;    Home Medications    Prior to Admission medications   Medication Sig Start Date End Date Taking? Authorizing Provider  ADVAIR DISKUS 250-50 MCG/DOSE AEPB USE 1 INHALATION BY MOUTH EVERY 12 HOURS. <<RINSE MOUTH AFTER USE>>. 10/28/14   Patrici Ranks, MD  aspirin EC 81 MG tablet Take 81 mg by mouth daily.    Historical Provider, MD  bumetanide (BUMEX) 1 MG tablet Take 3 tablets by mouth daily. 04/24/16   Historical Provider, MD  ipratropium-albuterol (DUONEB) 0.5-2.5 (3) MG/3ML SOLN Take 3 mLs by nebulization  every 6 (six) hours. Patient taking differently: Take 3 mLs by nebulization 2 (two) times daily as needed (for wheezing or shortness of breath).  08/07/14   Kathie Dike, MD  lisinopril (PRINIVIL,ZESTRIL) 20 MG tablet Take 0.5 tablets (10 mg total) by mouth daily. 08/07/14   Kathie Dike, MD  MAGNESIUM PO Take 1 tablet by mouth daily.    Historical Provider, MD  nicotine (NICODERM CQ) 21 mg/24hr patch Place 1 patch (21 mg  total) onto the skin daily. 08/17/15   Lendon Colonel, NP  vitamin B-12 (CYANOCOBALAMIN) 1000 MCG tablet Take 1,000 mcg by mouth daily.    Historical Provider, MD    Family History Family History  Problem Relation Age of Onset  . Stroke Mother   . Emphysema Father     Social History Social History  Substance Use Topics  . Smoking status: Current Some Day Smoker    Packs/day: 0.50    Years: 50.00    Types: Cigarettes    Start date: 04/17/1961  . Smokeless tobacco: Never Used     Comment:  5 ciggs per week / every other day   . Alcohol use No     Comment: No EtOH for 7 years     Allergies   Amoxicillin and Penicillins   Review of Systems Review of Systems All systems reviewed and are negative for acute change except as noted in the HPI.    Physical Exam Updated Vital Signs BP 108/62 (BP Location: Left Arm)   Pulse 88   Temp 98.2 F (36.8 C) (Oral)   Resp (!) 22   Ht 5' 7.5" (1.715 m)   Wt 241 lb (109.3 kg)   SpO2 93%   BMI 37.19 kg/m   Physical Exam  Constitutional: He is oriented to person, place, and time. He appears well-developed and well-nourished. No distress.  HENT:  Head: Normocephalic and atraumatic.  Mouth/Throat: Oropharynx is clear and moist. No oropharyngeal exudate.  Eyes: Conjunctivae and EOM are normal. Pupils are equal, round, and reactive to light.  Neck: Normal range of motion. Neck supple.  No meningismus.  Cardiovascular: Normal rate, regular rhythm, normal heart sounds and intact distal pulses.   No murmur heard. +2 pretibial edema  Pulmonary/Chest: Effort normal. No respiratory distress. He has wheezes.  Scattered wheezes. Crackles at bases.Speaking full sentences.   Abdominal: Soft. There is no tenderness. There is no rebound and no guarding.  Musculoskeletal: Normal range of motion. He exhibits no edema or tenderness.  Neurological: He is alert and oriented to person, place, and time. No cranial nerve deficit. He exhibits normal  muscle tone. Coordination normal.   5/5 strength throughout. CN 2-12 intact.Equal grip strength.   Skin: Skin is warm.  Psychiatric: He has a normal mood and affect. His behavior is normal.  Nursing note and vitals reviewed.    ED Treatments / Results  DIAGNOSTIC STUDIES:  Oxygen Saturation is 93% on RA, normal by my interpretation.    COORDINATION OF CARE:  11:04 PM Discussed treatment plan with pt at bedside including blood work, CXR, and EKG, and pt agreed to plan.  Labs (all labs ordered are listed, but only abnormal results are displayed) Labs Reviewed  BRAIN NATRIURETIC PEPTIDE - Abnormal; Notable for the following:       Result Value   B Natriuretic Peptide 123.0 (*)    All other components within normal limits  BASIC METABOLIC PANEL - Abnormal; Notable for the following:    Chloride 90 (*)  CO2 39 (*)    BUN 23 (*)    Creatinine, Ser 1.33 (*)    Calcium 8.6 (*)    GFR calc non Af Amer 54 (*)    All other components within normal limits  CBC WITH DIFFERENTIAL/PLATELET - Abnormal; Notable for the following:    RBC 6.35 (*)    Hemoglobin 12.4 (*)    MCV 73.7 (*)    MCH 19.5 (*)    MCHC 26.5 (*)    RDW 20.6 (*)    All other components within normal limits  TROPONIN I - Abnormal; Notable for the following:    Troponin I 0.04 (*)    All other components within normal limits    EKG  EKG Interpretation  Date/Time:  Thursday July 27 2016 20:06:32 EDT Ventricular Rate:  85 PR Interval:  144 QRS Duration: 78 QT Interval:  382 QTC Calculation: 454 R Axis:   44 Text Interpretation:  Sinus rhythm with marked sinus arrhythmia ST & T wave abnormality, consider inferior ischemia ST & T wave abnormality, consider anterolateral ischemia Abnormal ECG No significant change was found Confirmed by Wyvonnia Dusky  MD, Anesia Blackwell [phone]) on 07/27/2016 10:58:09 PM       Radiology Dg Chest 2 View  Result Date: 07/27/2016 CLINICAL DATA:  Dyspnea times 2-3 days with nonproductive  cough. EXAM: CHEST  2 VIEW COMPARISON:  None. FINDINGS: The heart size and mediastinal contours are within normal limits. No aneurysmal dilatation of the aorta. Chronic mild interstitial prominence which may reflect bronchitic change. Both lungs are clear. Bilateral clavicular resection nodes are noted as before. No acute osseous appearing abnormality. IMPRESSION: Chronic bronchitic change of the lungs. Chronic partial resections of both clavicles. Electronically Signed   By: Ashley Royalty M.D.   On: 07/27/2016 20:47    Procedures Procedures (including critical care time)  Medications Ordered in ED Medications - No data to display   Initial Impression / Assessment and Plan / ED Course  I have reviewed the triage vital signs and the nursing notes.  Pertinent labs & imaging results that were available during my care of the patient were reviewed by me and considered in my medical decision making (see chart for details).    Patient presents with leg swelling and shortness of breath worsening over the past one week. History of CHF as well as COPD. States compliance with medications. Wears oxygen at night only. No chest pain.  Patient dyspneic with conversation. He has crackles at the bases with scattered respiratory wheezing. He is hypoxic with ambulation to 83%. X-ray shows chronic bronchitis changes without edema.  Patient given bronchodilators and Lasix. Normal EF on last echo. Suspect combination of CHF and COPD.  Creatinine stable. Minimal troponin elevation is previous. D-dimer is negative. Patient attempted to ambulate but was initially hypoxic and dyspneic to 83% on room air.  Plan admission for IV diuresis and continued bronchodilators. Discussed with Dr. Shanon Brow.  Final Clinical Impressions(s) / ED Diagnoses   Final diagnoses:  Acute diastolic congestive heart failure (HCC)  Hypoxia    New Prescriptions New Prescriptions   No medications on file   I personally performed the  services described in this documentation, which was scribed in my presence. The recorded information has been reviewed and is accurate.    Ezequiel Essex, MD 07/28/16 [phone]

## 2016-07-27 NOTE — ED Triage Notes (Signed)
Pt c/o bilateral lower extremities swelling that started a week ago, denies any pain, admits to sob that has increased since the swelling,

## 2016-07-27 NOTE — ED Notes (Addendum)
Wheelchair offered to take pt back to room from triage, pt refused and insisted on ambulating to room,  Pt's sats on  RA once in room were 83% and "winded", O2 applied via [place] at 2 L/m.  Sats increased to 97 %.

## 2016-07-27 NOTE — ED Notes (Signed)
CRITICAL VALUE ALERT  Critical value received:  Trop 0.04  Date of notification:  07/27/2016 Time of notification:  21:28  Critical value read back: yes  Nurse who received alert:  Luis Abed   MD notified (1st page):  Dr Rogene Houston  Time of first page: 21:38  MD notified (2nd page):  Time of second page:  Responding MD:  Dr Rogene Houston  Time MD responded:  21:38

## 2016-07-27 NOTE — ED Notes (Signed)
Attempted to ambulate pt. Pt states that he only wears oxygen at night to sleep and does not wear it at home. Removed oxygen, pt stood on the side of the bed briefly before ambulation, patient saturations 83% on room air. Place pt back in bed on 2l oxygen and placed on monitor. Saturations improved to the 90's

## 2016-07-27 NOTE — ED Notes (Signed)
ED Provider at bedside. 

## 2016-07-28 ENCOUNTER — Encounter (HOSPITAL_COMMUNITY): Payer: Self-pay

## 2016-07-28 DIAGNOSIS — I5031 Acute diastolic (congestive) heart failure: Secondary | ICD-10-CM | POA: Diagnosis not present

## 2016-07-28 DIAGNOSIS — Z823 Family history of stroke: Secondary | ICD-10-CM | POA: Diagnosis not present

## 2016-07-28 DIAGNOSIS — J449 Chronic obstructive pulmonary disease, unspecified: Secondary | ICD-10-CM | POA: Diagnosis not present

## 2016-07-28 DIAGNOSIS — I509 Heart failure, unspecified: Secondary | ICD-10-CM | POA: Diagnosis not present

## 2016-07-28 DIAGNOSIS — Z9981 Dependence on supplemental oxygen: Secondary | ICD-10-CM | POA: Diagnosis not present

## 2016-07-28 DIAGNOSIS — R0902 Hypoxemia: Secondary | ICD-10-CM | POA: Diagnosis not present

## 2016-07-28 DIAGNOSIS — Z825 Family history of asthma and other chronic lower respiratory diseases: Secondary | ICD-10-CM | POA: Diagnosis not present

## 2016-07-28 DIAGNOSIS — I5033 Acute on chronic diastolic (congestive) heart failure: Secondary | ICD-10-CM | POA: Diagnosis present

## 2016-07-28 DIAGNOSIS — Z880 Allergy status to penicillin: Secondary | ICD-10-CM | POA: Diagnosis not present

## 2016-07-28 DIAGNOSIS — Z7982 Long term (current) use of aspirin: Secondary | ICD-10-CM | POA: Diagnosis not present

## 2016-07-28 DIAGNOSIS — I110 Hypertensive heart disease with heart failure: Secondary | ICD-10-CM | POA: Diagnosis present

## 2016-07-28 DIAGNOSIS — J441 Chronic obstructive pulmonary disease with (acute) exacerbation: Secondary | ICD-10-CM | POA: Diagnosis present

## 2016-07-28 DIAGNOSIS — J9621 Acute and chronic respiratory failure with hypoxia: Secondary | ICD-10-CM | POA: Diagnosis present

## 2016-07-28 DIAGNOSIS — Z881 Allergy status to other antibiotic agents status: Secondary | ICD-10-CM | POA: Diagnosis not present

## 2016-07-28 DIAGNOSIS — D649 Anemia, unspecified: Secondary | ICD-10-CM | POA: Diagnosis present

## 2016-07-28 LAB — TROPONIN I
Troponin I: 0.03 ng/mL (ref <0.03)
Troponin I: <0.03 ng/mL (ref <0.03)
Troponin I: <0.03 ng/mL (ref <0.03)

## 2016-07-28 MED ORDER — SODIUM CHLORIDE 0.9% FLUSH: 3.0000 mL | INTRAVENOUS | Status: DC | PRN

## 2016-07-28 MED ORDER — ONDANSETRON HCL 4 MG/2ML IJ SOLN: 4.0000 mg | Freq: Four times a day (QID) | INTRAMUSCULAR | Status: DC | PRN

## 2016-07-28 MED ORDER — ORAL CARE MOUTH RINSE
15.0000 mL | Freq: Two times a day (BID) | OROMUCOSAL | Status: DC
  Administered 2016-07-28 – 2016-07-30 (×4): 15 mL via OROMUCOSAL

## 2016-07-28 MED ORDER — METHYLPREDNISOLONE SODIUM SUCC 125 MG IJ SOLR
60.0000 mg | Freq: Once | INTRAMUSCULAR | Status: AC
  Administered 2016-07-28: 60 mg via INTRAVENOUS
  Filled 2016-07-28: qty 2

## 2016-07-28 MED ORDER — SODIUM CHLORIDE 0.9% FLUSH
3.0000 mL | Freq: Two times a day (BID) | INTRAVENOUS | Status: DC
  Administered 2016-07-28 – 2016-07-31 (×7): 3 mL via INTRAVENOUS

## 2016-07-28 MED ORDER — IPRATROPIUM-ALBUTEROL 0.5-2.5 (3) MG/3ML IN SOLN
3.0000 mL | Freq: Four times a day (QID) | RESPIRATORY_TRACT | Status: DC
  Administered 2016-07-28 – 2016-07-31 (×12): 3 mL via RESPIRATORY_TRACT
  Filled 2016-07-28 (×12): qty 3

## 2016-07-28 MED ORDER — ACETAMINOPHEN 325 MG PO TABS
650.0000 mg | ORAL_TABLET | ORAL | Status: DC | PRN
  Administered 2016-07-30: 650 mg via ORAL
  Filled 2016-07-28: qty 2

## 2016-07-28 MED ORDER — NICOTINE 21 MG/24HR TD PT24
21.0000 mg | MEDICATED_PATCH | Freq: Every day | TRANSDERMAL | Status: DC
  Administered 2016-07-28 – 2016-07-31 (×4): 21 mg via TRANSDERMAL
  Filled 2016-07-28 (×4): qty 1

## 2016-07-28 MED ORDER — METHYLPREDNISOLONE SODIUM SUCC 125 MG IJ SOLR
60.0000 mg | Freq: Four times a day (QID) | INTRAMUSCULAR | Status: DC
  Administered 2016-07-28 – 2016-07-29 (×5): 60 mg via INTRAVENOUS
  Filled 2016-07-28 (×5): qty 2

## 2016-07-28 MED ORDER — FUROSEMIDE 10 MG/ML IJ SOLN
60.0000 mg | Freq: Two times a day (BID) | INTRAMUSCULAR | Status: DC
  Administered 2016-07-28 – 2016-07-30 (×6): 60 mg via INTRAVENOUS
  Filled 2016-07-28 (×6): qty 6

## 2016-07-28 MED ORDER — ASPIRIN EC 81 MG PO TBEC
81.0000 mg | DELAYED_RELEASE_TABLET | Freq: Every day | ORAL | Status: DC
  Administered 2016-07-28 – 2016-07-31 (×4): 81 mg via ORAL
  Filled 2016-07-28 (×4): qty 1

## 2016-07-28 MED ORDER — SODIUM CHLORIDE 0.9 % IV SOLN: 250.0000 mL | INTRAVENOUS | Status: DC | PRN

## 2016-07-28 NOTE — ED Notes (Signed)
ED Provider at bedside. 

## 2016-07-28 NOTE — ED Notes (Signed)
Report attempted 

## 2016-07-28 NOTE — Progress Notes (Signed)
Pt had o2 off upon entering room, spo2 64% on room air, informed pt how low his sats are and how important it is for him to keep his oxygen o2...spo2 increased to 98% with treatment, o2 2lpm after treatment nurse informed

## 2016-07-28 NOTE — Progress Notes (Signed)
  PROGRESS NOTE    Brian Parsons is a 66 y.o. male with medical history significant of diastolic chf, COPD, HTN, qhs home o2 comes in with over a week of worsening ble edema and sob. He was admitted for acute respiratory failure secondary to acute on chronic diastolic heart failure and acute copd exacerbation.    Plan:  1. IV lasix 40 mg BID.  2. IV steroids.  3. duonebs every 6 hours.  4. Cough medication.   Hosie Poisson.MD [phone]

## 2016-07-28 NOTE — H&P (Signed)
History and Physical    Brian Parsons PFX:[id number] DOB: February 07, 1951 DOA: 07/27/2016  PCP: [name], PA-C  Patient coming from: home  Chief Complaint:   Sob, swelling in legs  HPI: Brian Parsons is a 66 y.o. male with medical history significant of diastolic chf, COPD, HTN, qhs home o2 comes in with over a week of worsening ble edema and sob.  No fevers.  No n/v/d.  Can never lie flat to sleep, sleeps in upright position normally.  On 2liter [place] qhs only.  No cough.  No chest pain.  Pt found to be 83% on RA on arrival, chf exac and referred for admission for such.  He has increased his toresimide in the last 3 days.   Review of Systems: As per HPI otherwise 10 point review of systems negative.   Past Medical History:  Diagnosis Date  . Bilateral leg edema   . CHF (congestive heart failure) ([place])   . COPD (chronic obstructive pulmonary disease) ([place])   . Coronary atherosclerosis    Nonobstructive at cardiac catheterization April 2017  . Essential hypertension   . On home O2    2L N/C  . Polycythemia     Past Surgical History:  Procedure Laterality Date  . CARDIAC CATHETERIZATION N/A 08/10/2015   Procedure: Left Heart Cath and Coronary Angiography;  Surgeon: Troy Sine, MD;  Location: [facility];  Service: Cardiovascular;  Laterality: N/A;     reports that he has been smoking Cigarettes.  He started smoking about 55 years ago. He has a 25.00 pack-year smoking history. He has never used smokeless tobacco. He reports that he does not drink alcohol or use drugs.  Allergies  Allergen Reactions  . Amoxicillin Hives  . Penicillins Hives    Has patient had a PCN reaction causing immediate rash, facial/tongue/throat swelling, SOB or lightheadedness with hypotension: Unknown Has patient had a PCN reaction causing severe rash involving mucus membranes or skin necrosis: No Has patient had a PCN reaction that required hospitalization No Has patient had a PCN reaction  occurring within the last 10 years: No If all of the above answers are "NO", then may proceed with Cephalosporin use.     Family History  Problem Relation Age of Onset  . Stroke Mother   . Emphysema Father     Prior to Admission medications   Medication Sig Start Date End Date Taking? Authorizing Provider  ADVAIR DISKUS 250-50 MCG/DOSE AEPB USE 1 INHALATION BY MOUTH EVERY 12 HOURS. <<RINSE MOUTH AFTER USE>>. 10/28/14   Patrici Ranks, MD  aspirin EC 81 MG tablet Take 81 mg by mouth daily.    Historical Provider, MD  bumetanide (BUMEX) 1 MG tablet Take 3 tablets by mouth daily. 04/24/16   Historical Provider, MD  ipratropium-albuterol (DUONEB) 0.5-2.5 (3) MG/3ML SOLN Take 3 mLs by nebulization every 6 (six) hours. Patient taking differently: Take 3 mLs by nebulization 2 (two) times daily as needed (for wheezing or shortness of breath).  08/07/14   Kathie Dike, MD  lisinopril (PRINIVIL,ZESTRIL) 20 MG tablet Take 0.5 tablets (10 mg total) by mouth daily. 08/07/14   Kathie Dike, MD  MAGNESIUM PO Take 1 tablet by mouth daily.    Historical Provider, MD  nicotine (NICODERM CQ) 21 mg/24hr patch Place 1 patch (21 mg total) onto the skin daily. 08/17/15   Lendon Colonel, NP  vitamin B-12 (CYANOCOBALAMIN) 1000 MCG tablet Take 1,000 mcg by mouth daily.    Historical Provider, MD  Physical Exam: Vitals:   07/27/16 2338 07/28/16 0000 07/28/16 0030 07/28/16 0105  BP:  120/72 97/68 101/67  Pulse:  84 75 91  Resp:  (!) 27 (!) 25 19  Temp:      TempSrc:      SpO2: 99% 95% 98% 98%  Weight:      Height:        Constitutional: NAD, calm, comfortable Vitals:   07/27/16 2338 07/28/16 0000 07/28/16 0030 07/28/16 0105  BP:  120/72 97/68 101/67  Pulse:  84 75 91  Resp:  (!) 27 (!) 25 19  Temp:      TempSrc:      SpO2: 99% 95% 98% 98%  Weight:      Height:       Eyes: PERRL, lids and conjunctivae normal ENMT: Mucous membranes are moist. Posterior pharynx clear of any exudate or  lesions.Normal dentition.  Neck: normal, supple, no masses, no thyromegaly Respiratory: clear to auscultation bilaterally, no wheezing, no crackles. Normal respiratory effort. No accessory muscle use.  diminished Cardiovascular: Regular rate and rhythm, no murmurs / rubs / gallops. 1+ extremity edema. 2+ pedal pulses. No carotid bruits.  Abdomen: no tenderness, no masses palpated. No hepatosplenomegaly. Bowel sounds positive.  Musculoskeletal: no clubbing / cyanosis. No joint deformity upper and lower extremities. Good ROM, no contractures. Normal muscle tone.  Skin: no rashes, lesions, ulcers. No induration Neurologic: CN 2-12 grossly intact. Sensation intact, DTR normal. Strength 5/5 in all 4.  Psychiatric: Normal judgment and insight. Alert and oriented x 3. Normal mood.    Labs on Admission: I have personally reviewed following labs and imaging studies  CBC:  Recent Labs Lab 07/27/16 2032  WBC 6.8  NEUTROABS 3.6  HGB 12.4*  HCT 46.8  MCV 73.7*  PLT 270   Basic Metabolic Panel:  Recent Labs Lab 07/27/16 2032  NA 135  K 4.1  CL 90*  CO2 39*  GLUCOSE 94  BUN 23*  CREATININE 1.33*  CALCIUM 8.6*   GFR: Estimated Creatinine Clearance: 64.1 mL/min (A) (by C-G formula based on SCr of 1.33 mg/dL (H)).  Cardiac Enzymes:  Recent Labs Lab 07/27/16 2032  TROPONINI 0.04*   Radiological Exams on Admission: Dg Chest 2 View  Result Date: 07/27/2016 CLINICAL DATA:  Dyspnea times 2-3 days with nonproductive cough. EXAM: CHEST  2 VIEW COMPARISON:  None. FINDINGS: The heart size and mediastinal contours are within normal limits. No aneurysmal dilatation of the aorta. Chronic mild interstitial prominence which may reflect bronchitic change. Both lungs are clear. Bilateral clavicular resection nodes are noted as before. No acute osseous appearing abnormality. IMPRESSION: Chronic bronchitic change of the lungs. Chronic partial resections of both clavicles. Electronically Signed   By:  Ashley Royalty M.D.   On: 07/27/2016 20:47    Assessment/Plan 66 yo male with acute on chronic diastolic CHF exacerbation  Principal Problem:   Acute diastolic CHF (congestive heart failure) ([place])- place on lasix 60mg  iv q 12 hours.  Just had echo in jan 2017 and will not repeat.  Trop mildly elevated, will serial and trend.  Daily wts, I/os.    Active Problems:   Chronic diastolic heart failure ([place])- noted   COPD (chronic obstructive pulmonary disease) ([place])- noted, given solumedrol in ED.  Cont nebs.  Hold off on steroids at this time and increase diuresis   Lower extremity edema- due to above   Elevated troponin- as above   Acute on chronic respiratory failure ([place])- supplemental oxygen prn and  wean as tolerates   Essential hypertension- stable     DVT prophylaxis:  scds Code Status:  full Family Communication: none  Disposition Plan:  Per day team Consults called:  none Admission status:  Admission due to degree of hypoxia   DAVID,RACHAL A MD [facility]  If 7PM-7AM, please contact night-coverage [email or web address] Password [email or web address]  07/28/2016, 1:23 AM

## 2016-07-28 NOTE — Care Management Note (Signed)
Case Management Note  Patient Details  Name: Brian Parsons MRN: [id number] Date of Birth: Feb 22, 1951  Subjective/Objective:    Adm with Acute diastolic CHF. From home, ind with ADL's. Has oxygen provided by [employer], wears mostly at night but reports he does have concentrator and portable tanks at home if needed. He has PCP, still drives, no DME or HH PTA.                 Action/Plan: Anticipate DC home with self care.    Expected Discharge Date:     07/29/2016             Expected Discharge Plan:  Home/Self Care  In-House Referral:     Discharge planning Services  CM Consult  Post Acute Care Choice:    Choice offered to:  NA  DME Arranged:    DME Agency:     HH Arranged:    HH Agency:     Status of Service:  In process, will continue to follow  If discussed at Long Length of Stay Meetings, dates discussed:    Additional Comments:  Oliverio Cho, Chauncey Reading, RN 07/28/2016, 11:45 AM

## 2016-07-29 DIAGNOSIS — I5031 Acute diastolic (congestive) heart failure: Secondary | ICD-10-CM

## 2016-07-29 DIAGNOSIS — J449 Chronic obstructive pulmonary disease, unspecified: Secondary | ICD-10-CM

## 2016-07-29 LAB — BASIC METABOLIC PANEL
Anion gap: 7 (ref 5–15)
BUN: 24 mg/dL — AB (ref 6–20)
CHLORIDE: 88 mmol/L — AB (ref 101–111)
CO2: 41 mmol/L — ABNORMAL HIGH (ref 22–32)
CREATININE: 1.21 mg/dL (ref 0.61–1.24)
Calcium: 8.6 mg/dL — ABNORMAL LOW (ref 8.9–10.3)
GFR calc Af Amer: >60 mL/min (ref >60)
GFR calc non Af Amer: >60 mL/min (ref >60)
GLUCOSE: 142 mg/dL — AB (ref 65–99)
POTASSIUM: 4.5 mmol/L (ref 3.5–5.1)
SODIUM: 136 mmol/L (ref 135–145)

## 2016-07-29 MED ORDER — GUAIFENESIN-DM 100-10 MG/5ML PO SYRP
5.0000 mL | ORAL_SOLUTION | ORAL | Status: DC | PRN
  Administered 2016-07-29 (×2): 5 mL via ORAL
  Filled 2016-07-29 (×2): qty 5

## 2016-07-29 MED ORDER — METHYLPREDNISOLONE SODIUM SUCC 125 MG IJ SOLR
60.0000 mg | Freq: Two times a day (BID) | INTRAMUSCULAR | Status: DC
  Administered 2016-07-29 – 2016-07-31 (×4): 60 mg via INTRAVENOUS
  Filled 2016-07-29 (×4): qty 2

## 2016-07-29 MED ORDER — ADENOSINE 6 MG/2ML IV SOLN
INTRAVENOUS | Status: AC
  Filled 2016-07-29: qty 2

## 2016-07-29 MED ORDER — DOXYCYCLINE HYCLATE 100 MG PO TABS
100.0000 mg | ORAL_TABLET | Freq: Two times a day (BID) | ORAL | Status: DC
  Administered 2016-07-29 – 2016-07-31 (×5): 100 mg via ORAL
  Filled 2016-07-29 (×5): qty 1

## 2016-07-29 NOTE — Progress Notes (Signed)
PROGRESS NOTE    Brian Parsons  CBS:[id number] DOB: 14-Sep-1950 DOA: 07/27/2016 PCP: [name], PA-C    Brief Narrative: 66 year old gentleman, with h/o chronic diastolic heart failure, COPD, hypertension, presents with sob. H e was admitted for acute respiratory failure with hypoxia.   Assessment & Plan:   Principal Problem:   Acute diastolic CHF (congestive heart failure) ([facility]) Active Problems:   COPD (chronic obstructive pulmonary disease) ([facility])   Lower extremity edema   Elevated troponin   Acute on chronic respiratory failure ([facility])   Chronic diastolic heart failure ([facility])   Essential hypertension   Acute respiratory failure with hypoxia secondary to acute on chronic diastolic heart failure, COPD exacerbation.  Admitted for IV lasix, diuresed about 2.5 lit since admission, daily weights, strict intake and output.  IV steroids and po doxycycline, duo nebs .  [place] oxygen to keep sats greater than 90%.    Hypertension: controlled.   Elevated troponin possibly from CHF.    Tobacco absue: counseling and nicotine patch ordered.     DVT prophylaxis: SCD'S Code Status: (Full/) Family Communication: none at bedside.  Disposition Plan: HOME on Monday.   Consultants:   None.    Procedures: none.    Antimicrobials: doxycycline    Subjective: Breathing better   Objective: Vitals:   07/28/16 2100 07/29/16 0236 07/29/16 0648 07/29/16 0808  BP: (!) 94/51  (!) 98/49   Pulse: 91  61   Resp: 18  18   Temp: 98.6 F (37 C)  98.5 F (36.9 C)   TempSrc: Oral  Oral   SpO2: 96% 98% 92% 93%  Weight:   107.6 kg (237 lb 3.4 oz)   Height:        Intake/Output Summary (Last 24 hours) at 07/29/16 1309 Last data filed at 07/29/16 0800  Gross per 24 hour  Intake              240 ml  Output             1000 ml  Net             -760 ml   Filed Weights   07/27/16 2001 07/28/16 0124 07/29/16 0648  Weight: 109.3 kg (241 lb) 107.3 kg (236 lb 8 oz) 107.6 kg (237 lb 3.4  oz)    Examination:  General exam: Appears calm and comfortable  Respiratory system: Clear to auscultation. Respiratory effort normal. Cardiovascular system: S1 & S2 heard, RRR. No JVD, murmurs, rubs, gallops or clicks. No pedal edema. Gastrointestinal system: Abdomen is nondistended, soft and nontender. No organomegaly or masses felt. Normal bowel sounds heard. Central nervous system: Alert and oriented. No focal neurological deficits. Extremities: Symmetric 5 x 5 power. Skin: No rashes, lesions or ulcers Psychiatry: Judgement and insight appear normal. Mood & affect appropriate.     Data Reviewed: I have personally reviewed following labs and imaging studies  CBC:  Recent Labs Lab 07/27/16 2032  WBC 6.8  NEUTROABS 3.6  HGB 12.4*  HCT 46.8  MCV 73.7*  PLT 846   Basic Metabolic Panel:  Recent Labs Lab 07/27/16 2032 07/29/16 0612  NA 135 136  K 4.1 4.5  CL 90* 88*  CO2 39* 41*  GLUCOSE 94 142*  BUN 23* 24*  CREATININE 1.33* 1.21  CALCIUM 8.6* 8.6*   GFR: Estimated Creatinine Clearance: 69.9 mL/min (by C-G formula based on SCr of 1.21 mg/dL). Liver Function Tests: No results for input(s): AST, ALT, ALKPHOS, BILITOT, PROT, ALBUMIN  in the last 168 hours. No results for input(s): LIPASE, AMYLASE in the last 168 hours. No results for input(s): AMMONIA in the last 168 hours. Coagulation Profile: No results for input(s): INR, PROTIME in the last 168 hours. Cardiac Enzymes:  Recent Labs Lab 07/27/16 2032 07/28/16 0158 07/28/16 0736 07/28/16 1317  TROPONINI 0.04* 0.03* <0.03 <0.03   BNP (last 3 results) No results for input(s): PROBNP in the last 8760 hours. HbA1C: No results for input(s): HGBA1C in the last 72 hours. CBG: No results for input(s): GLUCAP in the last 168 hours. Lipid Profile: No results for input(s): CHOL, HDL, LDLCALC, TRIG, CHOLHDL, LDLDIRECT in the last 72 hours. Thyroid Function Tests: No results for input(s): TSH, T4TOTAL, FREET4,  T3FREE, THYROIDAB in the last 72 hours. Anemia Panel: No results for input(s): VITAMINB12, FOLATE, FERRITIN, TIBC, IRON, RETICCTPCT in the last 72 hours. Sepsis Labs: No results for input(s): PROCALCITON, LATICACIDVEN in the last 168 hours.  No results found for this or any previous visit (from the past 240 hour(s)).       Radiology Studies: Dg Chest 2 View  Result Date: 07/27/2016 CLINICAL DATA:  Dyspnea times 2-3 days with nonproductive cough. EXAM: CHEST  2 VIEW COMPARISON:  None. FINDINGS: The heart size and mediastinal contours are within normal limits. No aneurysmal dilatation of the aorta. Chronic mild interstitial prominence which may reflect bronchitic change. Both lungs are clear. Bilateral clavicular resection nodes are noted as before. No acute osseous appearing abnormality. IMPRESSION: Chronic bronchitic change of the lungs. Chronic partial resections of both clavicles. Electronically Signed   By: Ashley Royalty M.D.   On: 07/27/2016 20:47        Scheduled Meds: . adenosine      . aspirin EC  81 mg Oral Daily  . furosemide  60 mg Intravenous Q12H  . ipratropium-albuterol  3 mL Nebulization Q6H  . mouth rinse  15 mL Mouth Rinse BID  . methylPREDNISolone (SOLU-MEDROL) injection  60 mg Intravenous Q6H  . nicotine  21 mg Transdermal Daily  . sodium chloride flush  3 mL Intravenous Q12H   Continuous Infusions:   LOS: 1 day    Time spent: 30 minutes.     Hosie Poisson, MD [facility] Pager [phone] If 7PM-7AM, please contact night-coverage [email or web address] Password [email or web address] 07/29/2016, 1:09 PM

## 2016-07-30 LAB — BASIC METABOLIC PANEL
ANION GAP: 7 (ref 5–15)
BUN: 30 mg/dL — ABNORMAL HIGH (ref 6–20)
CHLORIDE: 86 mmol/L — AB (ref 101–111)
CO2: 43 mmol/L — ABNORMAL HIGH (ref 22–32)
Calcium: 8.8 mg/dL — ABNORMAL LOW (ref 8.9–10.3)
Creatinine, Ser: 1.26 mg/dL — ABNORMAL HIGH (ref 0.61–1.24)
GFR calc Af Amer: >60 mL/min (ref >60)
GFR, EST NON AFRICAN AMERICAN: 57 mL/min — AB (ref >60)
GLUCOSE: 159 mg/dL — AB (ref 65–99)
POTASSIUM: 4.6 mmol/L (ref 3.5–5.1)
Sodium: 136 mmol/L (ref 135–145)

## 2016-07-30 NOTE — Progress Notes (Signed)
PROGRESS NOTE    Brian Parsons  VQM:[id number] DOB: 05-Oct-1950 DOA: 07/27/2016 PCP: [name], PA-C    Brief Narrative: 66 year old gentleman, with h/o chronic diastolic heart failure, COPD, hypertension, presents with sob. H e was admitted for acute respiratory failure with hypoxia.   Assessment & Plan:   Principal Problem:   Acute diastolic CHF (congestive heart failure) ([facility]) Active Problems:   COPD (chronic obstructive pulmonary disease) ([facility])   Lower extremity edema   Elevated troponin   Acute on chronic respiratory failure ([facility])   Chronic diastolic heart failure ([facility])   Essential hypertension   Acute respiratory failure with hypoxia secondary to acute on chronic diastolic heart failure, COPD exacerbation.  Admitted for IV lasix, diuresed about 4 lit since admission, daily weights, strict intake and output.  IV steroids and po doxycycline, duo nebs . Patient's wheezing has improved and decreased the steroids to 60 mg Q12HR. Worsening renal parameters , decrease the lasix dose and change to po today.  [place] oxygen to keep sats greater than 90%.    Hypertension: controlled.   Elevated troponin possibly from CHF. No chest pain.    Tobacco absue: counseling and nicotine patch ordered.   Mild normocytic anemia:  Hemoglobin stable at 12.4 Monitor prn.   DVT prophylaxis: SCD'S Code Status: (Full/) Family Communication: none at bedside.  Disposition Plan: HOME on Monday.   Consultants:   None.    Procedures: none.    Antimicrobials: doxycycline    Subjective: Breathing better  Coughing more.   Objective: Vitals:   07/30/16 0658 07/30/16 0745 07/30/16 1300 07/30/16 1441  BP: (!) 95/56  119/81   Pulse: (!) 110  90   Resp: 18  18   Temp: 98.1 F (36.7 C)  98.2 F (36.8 C)   TempSrc: Oral  Oral   SpO2: 94% 92% 91% (!) 88%  Weight:      Height:        Intake/Output Summary (Last 24 hours) at 07/30/16 1507 Last data filed at 07/30/16 1200  Gross  per 24 hour  Intake              723 ml  Output             2100 ml  Net            -1377 ml   Filed Weights   07/27/16 2001 07/28/16 0124 07/29/16 0648  Weight: 109.3 kg (241 lb) 107.3 kg (236 lb 8 oz) 107.6 kg (237 lb 3.4 oz)    Examination:  General exam: Appears calm and comfortable  Respiratory system: improving wheezing. Rales at bases.  Cardiovascular system: S1 & S2 heard, RRR. No JVD, murmurs, rubs, gallops or clicks. No pedal edema. Gastrointestinal system: Abdomen is nondistended, soft and nontender. No organomegaly or masses felt. Normal bowel sounds heard. Central nervous system: Alert and oriented. No focal neurological deficits. Extremities: Symmetric 5 x 5 power. Skin: No rashes, lesions or ulcers Psychiatry: Judgement and insight appear normal. Mood & affect appropriate.     Data Reviewed: I have personally reviewed following labs and imaging studies  CBC:  Recent Labs Lab 07/27/16 2032  WBC 6.8  NEUTROABS 3.6  HGB 12.4*  HCT 46.8  MCV 73.7*  PLT 932   Basic Metabolic Panel:  Recent Labs Lab 07/27/16 2032 07/29/16 0612 07/30/16 0617  NA 135 136 136  K 4.1 4.5 4.6  CL 90* 88* 86*  CO2 39* 41* 43*  GLUCOSE 94 142*  159*  BUN 23* 24* 30*  CREATININE 1.33* 1.21 1.26*  CALCIUM 8.6* 8.6* 8.8*   GFR: Estimated Creatinine Clearance: 67.1 mL/min (A) (by C-G formula based on SCr of 1.26 mg/dL (H)). Liver Function Tests: No results for input(s): AST, ALT, ALKPHOS, BILITOT, PROT, ALBUMIN in the last 168 hours. No results for input(s): LIPASE, AMYLASE in the last 168 hours. No results for input(s): AMMONIA in the last 168 hours. Coagulation Profile: No results for input(s): INR, PROTIME in the last 168 hours. Cardiac Enzymes:  Recent Labs Lab 07/27/16 2032 07/28/16 0158 07/28/16 0736 07/28/16 1317  TROPONINI 0.04* 0.03* <0.03 <0.03   BNP (last 3 results) No results for input(s): PROBNP in the last 8760 hours. HbA1C: No results for input(s):  HGBA1C in the last 72 hours. CBG: No results for input(s): GLUCAP in the last 168 hours. Lipid Profile: No results for input(s): CHOL, HDL, LDLCALC, TRIG, CHOLHDL, LDLDIRECT in the last 72 hours. Thyroid Function Tests: No results for input(s): TSH, T4TOTAL, FREET4, T3FREE, THYROIDAB in the last 72 hours. Anemia Panel: No results for input(s): VITAMINB12, FOLATE, FERRITIN, TIBC, IRON, RETICCTPCT in the last 72 hours. Sepsis Labs: No results for input(s): PROCALCITON, LATICACIDVEN in the last 168 hours.  No results found for this or any previous visit (from the past 240 hour(s)).       Radiology Studies: No results found.      Scheduled Meds: . aspirin EC  81 mg Oral Daily  . doxycycline  100 mg Oral Q12H  . ipratropium-albuterol  3 mL Nebulization Q6H  . mouth rinse  15 mL Mouth Rinse BID  . methylPREDNISolone (SOLU-MEDROL) injection  60 mg Intravenous Q12H  . nicotine  21 mg Transdermal Daily  . sodium chloride flush  3 mL Intravenous Q12H   Continuous Infusions:   LOS: 2 days    Time spent: 30 minutes.     Hosie Poisson, MD [facility] Pager [phone] If 7PM-7AM, please contact night-coverage [email or web address] Password [facility] 07/30/2016, 3:07 PM

## 2016-07-31 LAB — BASIC METABOLIC PANEL
Anion gap: 7 (ref 5–15)
BUN: 29 mg/dL — AB (ref 6–20)
CO2: 41 mmol/L — AB (ref 22–32)
Calcium: 8.8 mg/dL — ABNORMAL LOW (ref 8.9–10.3)
Chloride: 88 mmol/L — ABNORMAL LOW (ref 101–111)
Creatinine, Ser: 1.07 mg/dL (ref 0.61–1.24)
GFR calc Af Amer: >60 mL/min (ref >60)
GLUCOSE: 183 mg/dL — AB (ref 65–99)
POTASSIUM: 4.1 mmol/L (ref 3.5–5.1)
Sodium: 136 mmol/L (ref 135–145)

## 2016-07-31 LAB — CBC
HEMATOCRIT: 49.9 % (ref 39.0–52.0)
Hemoglobin: 12.9 g/dL — ABNORMAL LOW (ref 13.0–17.0)
MCH: 19.5 pg — AB (ref 26.0–34.0)
MCHC: 25.9 g/dL — AB (ref 30.0–36.0)
MCV: 75.4 fL — ABNORMAL LOW (ref 78.0–100.0)
Platelets: 186 10*3/uL (ref 150–400)
RBC: 6.62 MIL/uL — ABNORMAL HIGH (ref 4.22–5.81)
RDW: 20.7 % — AB (ref 11.5–15.5)
WBC: 8.8 10*3/uL (ref 4.0–10.5)

## 2016-07-31 MED ORDER — DOXYCYCLINE HYCLATE 100 MG PO TABS: 100.0000 mg | ORAL_TABLET | Freq: Two times a day (BID) | ORAL | 0 refills | Status: DC

## 2016-07-31 MED ORDER — IPRATROPIUM-ALBUTEROL 0.5-2.5 (3) MG/3ML IN SOLN: 3.0000 mL | Freq: Four times a day (QID) | RESPIRATORY_TRACT | 2 refills | Status: DC | PRN

## 2016-07-31 MED ORDER — GUAIFENESIN-DM 100-10 MG/5ML PO SYRP: 5.0000 mL | ORAL_SOLUTION | ORAL | 0 refills | Status: DC | PRN

## 2016-07-31 MED ORDER — PREDNISONE 20 MG PO TABS: 40.0000 mg | ORAL_TABLET | Freq: Every day | ORAL | 0 refills | Status: AC

## 2016-07-31 NOTE — Progress Notes (Signed)
Patient discharged home.  IV remove -WNL.  Reviewed DC instructions and medications, patient knowledgeable about HF management at home.  Explained daily weights and salt restriction using teach back.  Instructed to follow up with PCP in 1 week.  Verbalizes understanding.  No questions at this time.  Assisted off unit in NAD.

## 2016-07-31 NOTE — Care Management Note (Signed)
Case Management Note  Patient Details  Name: Brian Parsons MRN: [id number] Date of Birth: [birth year]    Expected Discharge Date:  07/31/16               Expected Discharge Plan:  Home/Self Care  In-House Referral:     Discharge planning Services  CM Consult  Post Acute Care Choice:    Choice offered to:  NA  DME Arranged:    DME Agency:     HH Arranged:    [place] Agency:     Status of Service:  In process, will continue to follow  If discussed at Long Length of Stay Meetings, dates discussed:    Additional Comments: Patient discharging home today. No CM needs. Patient inquired about digital scales, CM checked with AD, no scales available. Patient notified, states he does have weighing scales at home, just not digital.   Lechelle Wrigley, Chauncey Reading, RN 07/31/2016, 12:07 PM

## 2016-07-31 NOTE — Care Management Important Message (Signed)
Important Message  Patient Details  Name: Brian Parsons MRN: [id number] Date of Birth: 07/07/1950   Medicare Important Message Given:  Yes    Dnasia Gauna, Chauncey Reading, RN 07/31/2016, 9:29 AM

## 2016-08-01 ENCOUNTER — Ambulatory Visit (HOSPITAL_COMMUNITY): Payer: Medicare Other | Admitting: Oncology

## 2016-08-01 ENCOUNTER — Other Ambulatory Visit (HOSPITAL_COMMUNITY): Payer: Medicare Other

## 2016-08-03 NOTE — Discharge Summary (Signed)
Physician Discharge Summary  Brian Parsons VVO:[id number] DOB: 1950-10-03 DOA: 07/27/2016  PCP: [name], PA-C  Admit date: 07/27/2016 Discharge date: 07/31/2016  Admitted From: Home. Disposition:  Home  Recommendations for Outpatient Follow-up:  1. Follow up with PCP in 1-2 weeks 2. Please obtain BMP/CBC in one week 3. Please follow up with cardiology in 1 week.     Discharge Condition:stable.  CODE STATUS: full code.  Diet recommendation: Heart Healthy   Brief/Interim Summary: 66 year old gentleman, with h/o chronic diastolic heart failure, COPD, hypertension, presents with sob. He was admitted for acute respiratory failure with hypoxia.   Discharge Diagnoses:  Principal Problem:   Acute diastolic CHF (congestive heart failure) (HCC) Active Problems:   COPD (chronic obstructive pulmonary disease) (HCC)   Lower extremity edema   Elevated troponin   Acute on chronic respiratory failure (HCC)   Chronic diastolic heart failure (HCC)   Essential hypertension  Acute respiratory failure with hypoxia secondary to acute on chronic diastolic heart failure, COPD exacerbation.  Admitted for IV lasix, diuresed about 4 lit since admission, daily weights, strict intake and output.  IV steroids and po doxycycline, duo nebs . Patient's wheezing has improved and decreased the steroids to 60 mg Q12HR.  Changed to oral diuretics and prednisone taper  on discharge   Hypertension: controlled.   Elevated troponin possibly from CHF. No chest pain.    Tobacco absue: counseling and nicotine patch ordered.   Mild normocytic anemia:  Hemoglobin stable at 12.4 Monitor prn.   Discharge Instructions  Discharge Instructions    (HEART FAILURE PATIENTS) Call MD:  Anytime you have any of the following symptoms: 1) 3 pound weight gain in 24 hours or 5 pounds in 1 week 2) shortness of breath, with or without a dry hacking cough 3) swelling in the hands, feet or stomach 4) if you have to  sleep on extra pillows at night in order to breathe.    Complete by:  As directed    Diet - low sodium heart healthy    Complete by:  As directed    Discharge instructions    Complete by:  As directed    Please follow up with PCP in one week, please follow up with cardiology in 1 to 2 weeks.     Allergies as of 07/31/2016      Reactions   Amoxicillin Hives   Penicillins Hives   Has patient had a PCN reaction causing immediate rash, facial/tongue/throat swelling, SOB or lightheadedness with hypotension: Unknown Has patient had a PCN reaction causing severe rash involving mucus membranes or skin necrosis: No Has patient had a PCN reaction that required hospitalization No Has patient had a PCN reaction occurring within the last 10 years: No If all of the above answers are "NO", then may proceed with Cephalosporin use.      Medication List    TAKE these medications   ADVAIR DISKUS 250-50 MCG/DOSE Aepb Generic drug:  Fluticasone-Salmeterol USE 1 INHALATION BY MOUTH EVERY 12 HOURS. <<RINSE MOUTH AFTER USE>>.   aspirin EC 81 MG tablet Take 81 mg by mouth daily.   doxycycline 100 MG tablet Commonly known as:  VIBRA-TABS Take 1 tablet (100 mg total) by mouth every 12 (twelve) hours.   guaiFENesin-dextromethorphan 100-10 MG/5ML syrup Commonly known as:  ROBITUSSIN DM Take 5 mLs by mouth every 4 (four) hours as needed for cough.   ipratropium-albuterol 0.5-2.5 (3) MG/3ML Soln Commonly known as:  DUONEB Take 3 mLs by nebulization  every 6 (six) hours as needed. What changed:  when to take this  reasons to take this   lisinopril 20 MG tablet Commonly known as:  PRINIVIL Take 0.5 tablets (10 mg total) by mouth daily.   MAGNESIUM PO Take 1 tablet by mouth daily.   nicotine 21 mg/24hr patch Commonly known as:  NICODERM CQ Place 1 patch (21 mg total) onto the skin daily.   potassium chloride 10 MEQ CR capsule Commonly known as:  MICRO-K Take 1 capsule by mouth daily.    predniSONE 20 MG tablet Commonly known as:  DELTASONE Take 2 tablets (40 mg total) by mouth daily with breakfast.   torsemide 20 MG tablet Commonly known as:  DEMADEX Take 60 mg by mouth daily.   vitamin B-12 1000 MCG tablet Commonly known as:  CYANOCOBALAMIN Take 1,000 mcg by mouth daily.      Follow-up Information    BAUCOM, JENNY B, PA-C. Schedule an appointment as soon as possible for a visit in 1 week(s).   Specialty:  Physician Assistant Contact information: [address] [place] [address] [phone]          Allergies  Allergen Reactions  . Amoxicillin Hives  . Penicillins Hives    Has patient had a PCN reaction causing immediate rash, facial/tongue/throat swelling, SOB or lightheadedness with hypotension: Unknown Has patient had a PCN reaction causing severe rash involving mucus membranes or skin necrosis: No Has patient had a PCN reaction that required hospitalization No Has patient had a PCN reaction occurring within the last 10 years: No If all of the above answers are "NO", then may proceed with Cephalosporin use.     Consultations:  None.    Procedures/Studies: Dg Chest 2 View  Result Date: 07/27/2016 CLINICAL DATA:  Dyspnea times 2-3 days with nonproductive cough. EXAM: CHEST  2 VIEW COMPARISON:  None. FINDINGS: The heart size and mediastinal contours are within normal limits. No aneurysmal dilatation of the aorta. Chronic mild interstitial prominence which may reflect bronchitic change. Both lungs are clear. Bilateral clavicular resection nodes are noted as before. No acute osseous appearing abnormality. IMPRESSION: Chronic bronchitic change of the lungs. Chronic partial resections of both clavicles. Electronically Signed   By: Ashley Royalty M.D.   On: 07/27/2016 20:47       Subjective: Breathing better .   Discharge Exam: Vitals:   07/30/16 2245 07/31/16 0539  BP: 126/85 120/75  Pulse: 93 85  Resp: 18 18  Temp: 98.1 F (36.7 C)  98.3 F (36.8 C)   Vitals:   07/30/16 2245 07/31/16 0219 07/31/16 0539 07/31/16 0746  BP: 126/85  120/75   Pulse: 93  85   Resp: 18  18   Temp: 98.1 F (36.7 C)  98.3 F (36.8 C)   TempSrc: Oral  Oral   SpO2: 93% 92% 96% 97%  Weight:      Height:        General: Pt is alert, awake, not in acute distress Cardiovascular: RRR, S1/S2 +, no rubs, no gallops Respiratory: CTA bilaterally, no wheezing, no rhonchi Abdominal: Soft, NT, ND, bowel sounds + Extremities: no edema, no cyanosis    The results of significant diagnostics from this hospitalization (including imaging, microbiology, ancillary and laboratory) are listed below for reference.     Microbiology: No results found for this or any previous visit (from the past 240 hour(s)).   Labs: BNP (last 3 results)  Recent Labs  02/24/16 1024 04/29/16 1233 07/27/16 2032  BNP 106.0* 325.0* 352.4*   Basic Metabolic Panel:  Recent Labs Lab 07/27/16 2032 07/29/16 0612 07/30/16 0617 07/31/16 0559  NA 135 136 136 136  K 4.1 4.5 4.6 4.1  CL 90* 88* 86* 88*  CO2 39* 41* 43* 41*  GLUCOSE 94 142* 159* 183*  BUN 23* 24* 30* 29*  CREATININE 1.33* 1.21 1.26* 1.07  CALCIUM 8.6* 8.6* 8.8* 8.8*   Liver Function Tests: No results for input(s): AST, ALT, ALKPHOS, BILITOT, PROT, ALBUMIN in the last 168 hours. No results for input(s): LIPASE, AMYLASE in the last 168 hours. No results for input(s): AMMONIA in the last 168 hours. CBC:  Recent Labs Lab 07/27/16 2032 07/31/16 0559  WBC 6.8 8.8  NEUTROABS 3.6  --   HGB 12.4* 12.9*  HCT 46.8 49.9  MCV 73.7* 75.4*  PLT 173 186   Cardiac Enzymes:  Recent Labs Lab 07/27/16 2032 07/28/16 0158 07/28/16 0736 07/28/16 1317  TROPONINI 0.04* 0.03* <0.03 <0.03   BNP: Invalid input(s): POCBNP CBG: No results for input(s): GLUCAP in the last 168 hours. D-Dimer No results for input(s): DDIMER in the last 72 hours. Hgb A1c No results for input(s): HGBA1C in the last 72  hours. Lipid Profile No results for input(s): CHOL, HDL, LDLCALC, TRIG, CHOLHDL, LDLDIRECT in the last 72 hours. Thyroid function studies No results for input(s): TSH, T4TOTAL, T3FREE, THYROIDAB in the last 72 hours.  Invalid input(s): FREET3 Anemia work up No results for input(s): VITAMINB12, FOLATE, FERRITIN, TIBC, IRON, RETICCTPCT in the last 72 hours. Urinalysis    Component Value Date/Time   COLORURINE YELLOW 11/22/2015 1403   APPEARANCEUR CLEAR 11/22/2015 1403   LABSPEC 1.010 11/22/2015 1403   PHURINE 5.5 11/22/2015 1403   GLUCOSEU NEGATIVE 11/22/2015 1403   HGBUR NEGATIVE 11/22/2015 1403   BILIRUBINUR NEGATIVE 11/22/2015 1403   KETONESUR NEGATIVE 11/22/2015 1403   PROTEINUR NEGATIVE 11/22/2015 1403   NITRITE NEGATIVE 11/22/2015 1403   LEUKOCYTESUR NEGATIVE 11/22/2015 1403   Sepsis Labs Invalid input(s): PROCALCITONIN,  WBC,  LACTICIDVEN Microbiology No results found for this or any previous visit (from the past 240 hour(s)).   Time coordinating discharge: Over 30 minutes  SIGNED:   Hosie Poisson, MD  [facility] 08/03/2016, 12:10 PM Pager   If 7PM-7AM, please contact night-coverage [email or web address] Password [email or web address]

## 2016-10-16 NOTE — Assessment & Plan Note (Addendum)
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  JAK/MPL/CALR mutations are negative.  Labs today: CBC diff, BMET, ferritin.  I personally reviewed and went over laboratory results with the patient.  The results are noted within this dictation.  Labs in 3 months: CBC diff, BMET.  We will maintain a HCT of 50% or less with therapeutic phlebotomies when indicated.  Two hospitalizations noted since his last visit at [facility].  Both hospitalizations were secondary to COPD exacerbation and acute on chronic CHF.  Return in 3 months for follow-up.

## 2016-10-16 NOTE — Progress Notes (Signed)
Brian Fairy, PA-C [address] [place] Brian Parsons [id number]  Polycythemia, secondary - Plan: CBC with Differential, Basic metabolic panel  Tobacco abuse  CURRENT THERAPY: Therapeutic phlebotomy to maintain HCT 50% or less.  INTERVAL HISTORY: Brian Parsons 66 y.o. 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  JAK/MPL/CALR mutations are negative. AND Tobacco abuse, quit in April 2018 AND CHF, two recent hospitalizations for acute exacerbation AND COPD, two recent hospitalizations for exacerbation  HPI Elements   Location: Blood  Quality: Polycythemia  Severity: Mild-moderate  Duration: Years  Context: Secondary polycythemia- JAK/MPL/CALR mutations are negative.  Timing:   Modifying Factors: Ongoing tobacco abuse.  COPD.  CHF.  Associated Signs & Symptoms:    Patient was recently in the hospital in April 2018 for acute on chronic congestive heart failure and COPD exacerbation.  He was admitted for a few days and diuresed 4 liters.  He was also given IV steroids and PO doxycycline.  Additionally, he was admitted in Jan 2018 for COPD exacerbation and CHF.    He reports not being a fan of therapeutic phlebotomies.  He denies any SOB or cough.  He denies any pruritis.  He reports quitting smoking following his last hospitalization in April 2018.  He reports that his wife is still working on smoking cessation.  He notes that he leaves the house when she smokes to help with his minimal cravings.    He recently started riding his bicycle for exercise.  He is encouraged to continue with this exercise.  His grandson is helping keep him active fishing, riding bikes, etc.  Review of Systems  Constitutional: Negative.  Negative for chills, fever and weight loss.  HENT: Negative.   Eyes: Negative.   Respiratory: Negative.  Negative for cough.   Cardiovascular: Positive for leg swelling. Negative for chest pain.    Gastrointestinal: Negative.  Negative for blood in stool, constipation, diarrhea, melena, nausea and vomiting.  Genitourinary: Negative.   Musculoskeletal: Negative.   Skin: Negative.   Neurological: Negative.  Negative for weakness.  Endo/Heme/Allergies: Negative.   Psychiatric/Behavioral: Negative.     Past Medical History:  Diagnosis Date  . Bilateral leg edema   . CHF (congestive heart failure) ([place])   . COPD (chronic obstructive pulmonary disease) ([employer])   . Coronary atherosclerosis    Nonobstructive at cardiac catheterization April 2017  . Essential hypertension   . On home O2    2L N/C  . Polycythemia     Past Surgical History:  Procedure Laterality Date  . CARDIAC CATHETERIZATION N/A 08/10/2015   Procedure: Left Heart Cath and Coronary Angiography;  Surgeon: Troy Sine, MD;  Location: [facility];  Service: Cardiovascular;  Laterality: N/A;    Family History  Problem Relation Age of Onset  . Stroke Mother   . Emphysema Father     Social History   Social History  . Marital status: Divorced    Spouse name: N/A  . Number of children: N/A  . Years of education: N/A   Social History Main Topics  . Smoking status: Current Some Day Smoker    Packs/day: 0.50    Years: 50.00    Types: Cigarettes    Start date: 04/17/1961  . Smokeless tobacco: Never Used     Comment:  5 ciggs per week / every other day   . Alcohol use No     Comment: No  EtOH for 7 years  . Drug use: No  . Sexual activity: Yes   Other Topics Concern  . None   Social History Narrative  . None     PHYSICAL EXAMINATION  ECOG PERFORMANCE STATUS: 1 - Symptomatic but completely ambulatory  Vitals:   10/17/16 0954  BP: 120/66  Pulse: 74  Resp: 20  Temp: 98.5 F (36.9 C)    GENERAL:alert, no distress, well nourished, well developed, comfortable, cooperative, obese, smiling and unaccompanied SKIN: skin color, texture, turgor are normal, no rashes or significant lesions HEAD:  Normocephalic, No masses, lesions, tenderness or abnormalities EYES: normal, EOMI, Conjunctiva are pink and non-injected EARS: External ears normal OROPHARYNX:lips, buccal mucosa, and tongue normal and mucous membranes are moist  NECK: supple, trachea midline LYMPH:  no palpable lymphadenopathy BREAST:not examined LUNGS: clear to auscultation  HEART: regular rate & rhythm ABDOMEN:abdomen soft and obese BACK: Back symmetric, no curvature. EXTREMITIES:less then 2 second capillary refill, no skin discoloration, no cyanosis, positive findings:  edema trace pitting edema in B/L LE.  NEURO: alert & oriented x 3 with fluent speech, no focal motor/sensory deficits, gait normal   LABORATORY DATA: CBC    Component Value Date/Time   WBC 8.8 07/31/2016 0559   RBC 6.62 (H) 07/31/2016 0559   HGB 12.9 (L) 07/31/2016 0559   HCT 49.9 07/31/2016 0559   PLT 186 07/31/2016 0559   MCV 75.4 (L) 07/31/2016 0559   MCH 19.5 (L) 07/31/2016 0559   MCHC 25.9 (L) 07/31/2016 0559   RDW 20.7 (H) 07/31/2016 0559   LYMPHSABS 2.2 07/27/2016 2032   MONOABS 0.8 07/27/2016 2032   EOSABS 0.2 07/27/2016 2032   BASOSABS 0.0 07/27/2016 2032      Chemistry      Component Value Date/Time   NA 136 07/31/2016 0559   K 4.1 07/31/2016 0559   CL 88 (L) 07/31/2016 0559   CO2 41 (H) 07/31/2016 0559   BUN 29 (H) 07/31/2016 0559   CREATININE 1.07 07/31/2016 0559   CREATININE 1.43 (H) 10/27/2015 0919      Component Value Date/Time   CALCIUM 8.8 (L) 07/31/2016 0559   ALKPHOS 60 04/29/2016 1233   AST 17 04/29/2016 1233   ALT 15 (L) 04/29/2016 1233   BILITOT 0.4 04/29/2016 1233     Lab Results  Component Value Date   FERRITIN 8 (L) 03/30/2016    PENDING LABS:   RADIOGRAPHIC STUDIES:  No results found.   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  JAK/MPL/CALR mutations are negative.  Labs today: CBC diff, BMET,  ferritin.  I personally reviewed and went over laboratory results with the patient.  The results are noted within this dictation.  Labs in 3 months: CBC diff, BMET.  We will maintain a HCT of 50% or less with therapeutic phlebotomies when indicated.  Two hospitalizations noted since his last visit at [facility].  Both hospitalizations were secondary to COPD exacerbation and acute on chronic CHF.  Return in 3 months for follow-up.  Tobacco abuse H/O tobacco abuse.  QUIT in April 2018.   ORDERS PLACED FOR THIS ENCOUNTER: Orders Placed This Encounter  Procedures  . CBC with Differential  . Basic metabolic panel    MEDICATIONS PRESCRIBED THIS ENCOUNTER: Meds ordered this encounter  Medications  . escitalopram (LEXAPRO) 10 MG tablet    THERAPY PLAN:  Continue to monitor HCT and maintain it at 50% or less with therapeutic  phlebotomies.  Part of treatment/management is maintain iron deficiency as well to decrease erythropoiesis.   All questions were answered. The patient knows to call the clinic with any problems, questions or concerns. We can certainly see the patient much sooner if necessary.  Patient and plan discussed with Dr. Twana First and she is in agreement with the aforementioned.   This note is electronically signed by: Robynn Pane, PA-C 10/17/2016 10:10 AM

## 2016-10-17 ENCOUNTER — Encounter (HOSPITAL_COMMUNITY): Payer: Self-pay | Admitting: Oncology

## 2016-10-17 ENCOUNTER — Other Ambulatory Visit (HOSPITAL_COMMUNITY): Payer: Self-pay | Admitting: Oncology

## 2016-10-17 ENCOUNTER — Encounter (HOSPITAL_COMMUNITY): Payer: Medicare Other | Attending: Oncology | Admitting: Oncology

## 2016-10-17 ENCOUNTER — Encounter (HOSPITAL_COMMUNITY): Payer: Medicare Other

## 2016-10-17 DIAGNOSIS — D751 Secondary polycythemia: Secondary | ICD-10-CM | POA: Insufficient documentation

## 2016-10-17 DIAGNOSIS — Z720 Tobacco use: Secondary | ICD-10-CM | POA: Diagnosis not present

## 2016-10-17 LAB — CBC WITH DIFFERENTIAL/PLATELET
BASOS ABS: 0 10*3/uL (ref 0.0–0.1)
BASOS PCT: 0 %
EOS ABS: 0.2 10*3/uL (ref 0.0–0.7)
Eosinophils Relative: 3 %
HCT: 52.7 % — ABNORMAL HIGH (ref 39.0–52.0)
HEMOGLOBIN: 14 g/dL (ref 13.0–17.0)
Lymphocytes Relative: 32 %
Lymphs Abs: 2.5 10*3/uL (ref 0.7–4.0)
MCH: 19.9 pg — ABNORMAL LOW (ref 26.0–34.0)
MCHC: 26.6 g/dL — AB (ref 30.0–36.0)
MCV: 75 fL — ABNORMAL LOW (ref 78.0–100.0)
MONOS PCT: 8 %
Monocytes Absolute: 0.6 10*3/uL (ref 0.1–1.0)
NEUTROS ABS: 4.5 10*3/uL (ref 1.7–7.7)
NEUTROS PCT: 57 %
Platelets: 216 10*3/uL (ref 150–400)
RBC: 7.03 MIL/uL — ABNORMAL HIGH (ref 4.22–5.81)
RDW: 20.7 % — ABNORMAL HIGH (ref 11.5–15.5)
WBC: 7.8 10*3/uL (ref 4.0–10.5)

## 2016-10-17 LAB — BASIC METABOLIC PANEL
ANION GAP: 8 (ref 5–15)
BUN: 20 mg/dL (ref 6–20)
CALCIUM: 9.1 mg/dL (ref 8.9–10.3)
CHLORIDE: 91 mmol/L — AB (ref 101–111)
CO2: 39 mmol/L — ABNORMAL HIGH (ref 22–32)
CREATININE: 1.23 mg/dL (ref 0.61–1.24)
GFR calc Af Amer: >60 mL/min (ref >60)
GFR calc non Af Amer: 59 mL/min — ABNORMAL LOW (ref >60)
Glucose, Bld: 101 mg/dL — ABNORMAL HIGH (ref 65–99)
Potassium: 4 mmol/L (ref 3.5–5.1)
SODIUM: 138 mmol/L (ref 135–145)

## 2016-10-17 LAB — FERRITIN: FERRITIN: 6 ng/mL — AB (ref 24–336)

## 2016-10-17 NOTE — Patient Instructions (Signed)
Brian Parsons at [facility] Discharge Instructions  RECOMMENDATIONS MADE BY THE CONSULTANT AND ANY TEST RESULTS WILL BE SENT TO YOUR REFERRING PHYSICIAN.  You saw Kirby Crigler, PA-C, today. Labs today. Follow up and labs in 3 months.  Thank you for choosing [place] at [facility] to provide your oncology and hematology care.  To afford each patient quality time with our provider, please arrive at least 15 minutes before your scheduled appointment time.    If you have a lab appointment with the [facility] please come in thru the  Main Entrance and check in at the main information desk  You need to re-schedule your appointment should you arrive 10 or more minutes late.  We strive to give you quality time with our providers, and arriving late affects you and other patients whose appointments are after yours.  Also, if you no show three or more times for appointments you may be dismissed from the clinic at the providers discretion.     Again, thank you for choosing [facility].  Our hope is that these requests will decrease the amount of time that you wait before being seen by our physicians.       _____________________________________________________________  Should you have questions after your visit to [facility], please contact our office at [phone] between the hours of 8:30 a.m. and 4:30 p.m.  Voicemails left after 4:30 p.m. will not be returned until the following business day.  For prescription refill requests, have your pharmacy contact our office.       Resources For Cancer Patients and their Caregivers ? American Cancer Society: Can assist with transportation, wigs, general needs, runs Look Good Feel Better.        859-274-5582 ? Cancer Care: Provides financial assistance, online support groups, medication/co-pay assistance.  1-800-813-HOPE (636)194-4653) ? [place] Assists  [place] cancer patients and their families through emotional , educational and financial support.  2893292329 ? [place] DSS Where to apply for food stamps, Medicaid and utility assistance. 323-639-7808 ? [facility]: Transportation to medical appointments. 914-656-0557 ? Social Security Administration: May apply for disability if have a Stage IV cancer. 272-512-6477 (825)710-8701 ? [employer], Disability and Transit Services: Assists with nutrition, care and transit needs. [place] Support Programs: @10RELATIVEDAYS @ > Cancer Support Group  2nd Tuesday of the month 1pm-2pm, Journey Room  > Creative Journey  3rd Tuesday of the month 1130am-1pm, Journey Room  > Look Good Feel Better  1st Wednesday of the month 10am-12 noon, Journey Room (Call [place] to register [phone])

## 2016-10-17 NOTE — Assessment & Plan Note (Signed)
H/O tobacco abuse.  QUIT in April 2018.

## 2016-10-24 ENCOUNTER — Encounter (HOSPITAL_COMMUNITY)
Admission: RE | Admit: 2016-10-24 | Discharge: 2016-10-24 | Disposition: A | Discharge status: Home / Self Care | Payer: Medicare Other | Source: Ambulatory Visit | Attending: Oncology | Admitting: Oncology

## 2016-10-24 DIAGNOSIS — D751 Secondary polycythemia: Secondary | ICD-10-CM | POA: Insufficient documentation

## 2016-10-24 NOTE — Progress Notes (Signed)
Brian Parsons presents today for phlebotomy per MD orders. HGB/HCT:14.0/52.7% Phlebotomy procedure started at 0824 and ended at [place]. 76 0z removed. Patient tolerated procedure well. IV needle removed intact from R AC.

## 2017-01-17 ENCOUNTER — Other Ambulatory Visit (HOSPITAL_COMMUNITY): Payer: Medicare Other

## 2017-01-17 ENCOUNTER — Ambulatory Visit (HOSPITAL_COMMUNITY): Payer: Medicare Other

## 2017-01-26 ENCOUNTER — Ambulatory Visit (HOSPITAL_COMMUNITY): Payer: Medicare Other | Admitting: Oncology

## 2017-01-26 ENCOUNTER — Other Ambulatory Visit (HOSPITAL_COMMUNITY): Payer: Medicare Other

## 2017-01-29 ENCOUNTER — Other Ambulatory Visit (HOSPITAL_COMMUNITY): Payer: Medicare Other

## 2017-01-29 ENCOUNTER — Ambulatory Visit (HOSPITAL_COMMUNITY): Payer: Medicare Other | Admitting: Oncology

## 2017-02-19 ENCOUNTER — Encounter (HOSPITAL_COMMUNITY): Payer: Medicare Other | Attending: Oncology

## 2017-02-19 ENCOUNTER — Encounter (HOSPITAL_BASED_OUTPATIENT_CLINIC_OR_DEPARTMENT_OTHER): Payer: Medicare Other | Admitting: Oncology

## 2017-02-19 ENCOUNTER — Encounter (HOSPITAL_COMMUNITY): Payer: Self-pay | Admitting: Oncology

## 2017-02-19 VITALS — HR 102 | Temp 98.4°F | Resp 20 | Ht 67.4 in | Wt 232.1 lb

## 2017-02-19 DIAGNOSIS — J449 Chronic obstructive pulmonary disease, unspecified: Secondary | ICD-10-CM | POA: Diagnosis not present

## 2017-02-19 DIAGNOSIS — F17218 Nicotine dependence, cigarettes, with other nicotine-induced disorders: Secondary | ICD-10-CM

## 2017-02-19 DIAGNOSIS — D751 Secondary polycythemia: Secondary | ICD-10-CM | POA: Diagnosis present

## 2017-02-19 DIAGNOSIS — D45 Polycythemia vera: Secondary | ICD-10-CM

## 2017-02-19 DIAGNOSIS — R0902 Hypoxemia: Secondary | ICD-10-CM

## 2017-02-19 LAB — CBC WITH DIFFERENTIAL/PLATELET
BASOS PCT: 0 %
Basophils Absolute: 0 10*3/uL (ref 0.0–0.1)
EOS PCT: 0 %
Eosinophils Absolute: 0 10*3/uL (ref 0.0–0.7)
HCT: 54.8 % — ABNORMAL HIGH (ref 39.0–52.0)
Hemoglobin: 14.4 g/dL (ref 13.0–17.0)
Lymphocytes Relative: 9 %
Lymphs Abs: 1 10*3/uL (ref 0.7–4.0)
MCH: 19.7 pg — AB (ref 26.0–34.0)
MCHC: 26.3 g/dL — AB (ref 30.0–36.0)
MCV: 75 fL — AB (ref 78.0–100.0)
Monocytes Absolute: 0.5 10*3/uL (ref 0.1–1.0)
Monocytes Relative: 4 %
NEUTROS ABS: 10.1 10*3/uL — AB (ref 1.7–7.7)
Neutrophils Relative %: 87 %
PLATELETS: 254 10*3/uL (ref 150–400)
RBC: 7.31 MIL/uL — AB (ref 4.22–5.81)
RDW: 21.3 % — AB (ref 11.5–15.5)
WBC: 11.6 10*3/uL — AB (ref 4.0–10.5)

## 2017-02-19 LAB — BASIC METABOLIC PANEL
Anion gap: 10 (ref 5–15)
BUN: 28 mg/dL — AB (ref 6–20)
CALCIUM: 8.8 mg/dL — AB (ref 8.9–10.3)
CO2: 38 mmol/L — ABNORMAL HIGH (ref 22–32)
CREATININE: 1.34 mg/dL — AB (ref 0.61–1.24)
Chloride: 90 mmol/L — ABNORMAL LOW (ref 101–111)
GFR calc Af Amer: >60 mL/min (ref >60)
GFR, EST NON AFRICAN AMERICAN: 53 mL/min — AB (ref >60)
GLUCOSE: 170 mg/dL — AB (ref 65–99)
Potassium: 3.8 mmol/L (ref 3.5–5.1)
Sodium: 138 mmol/L (ref 135–145)

## 2017-02-19 NOTE — Progress Notes (Signed)
[name], PA-C [address] [place] [address]  No diagnosis found.  CURRENT THERAPY: Therapeutic phlebotomy to maintain HCT 50% or less.  INTERVAL HISTORY: Brian Parsons 66 y.o. 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  JAK/MPL/CALR mutations are negative. Patient has not been in the hospital since last April.  Continues to smoke. Has noticed swelling of lower extremity  Tobacco abuse,  AND CHF, two recent hospitalizations for acute exacerbation AND COPD, two recent hospitalizations for exacerbation  HPI Elements   Location: Blood  Quality: Polycythemia  Severity: Mild-moderate  Duration: Years  Context: Secondary polycythemia- JAK/MPL/CALR mutations are negative.  Timing:   Modifying Factors: Ongoing tobacco abuse.  COPD.  CHF.  Associated Signs & Symptoms:    Patient was recently in the hospital in April 2018 for acute on chronic congestive heart failure and COPD exacerbation.  He was admitted for a few days and diuresed 4 liters.  He was also given IV steroids and PO doxycycline.  Additionally, he was admitted in Jan 2018 for COPD exacerbation and CHF.    He reports not being a fan of therapeutic phlebotomies.  He denies any SOB or cough.  He denies any pruritis.  He reports quitting smoking following his last hospitalization in April 2018.  He reports that his wife is still working on smoking cessation.  He notes that he leaves the house when she smokes to help with his minimal cravings.    He recently started riding his bicycle for exercise.  He is encouraged to continue with this exercise.  His grandson is helping keep him active fishing, riding bikes, etc.  Review of Systems  Constitutional: Negative.  Negative for chills, fever and weight loss.  HENT: Negative.   Eyes: Negative.   Respiratory: Negative.  Negative for cough.   Cardiovascular: Positive for leg swelling. Negative for  chest pain.  Gastrointestinal: Negative.  Negative for blood in stool, constipation, diarrhea, melena, nausea and vomiting.  Genitourinary: Negative.   Musculoskeletal: Negative.   Skin: Negative.   Neurological: Negative.  Negative for weakness.  Endo/Heme/Allergies: Negative.   Psychiatric/Behavioral: Negative.     Past Medical History:  Diagnosis Date  . Bilateral leg edema   . CHF (congestive heart failure) ([place])   . COPD (chronic obstructive pulmonary disease) ([place])   . Coronary atherosclerosis    Nonobstructive at cardiac catheterization April 2017  . Essential hypertension   . On home O2    2L N/C  . Polycythemia     History reviewed. No pertinent surgical history.  Family History  Problem Relation Age of Onset  . Stroke Mother   . Emphysema Father     Social History   Socioeconomic History  . Marital status: Divorced    Spouse name: None  . Number of children: None  . Years of education: None  . Highest education level: None  Social Needs  . Financial resource strain: None  . Food insecurity - worry: None  . Food insecurity - inability: None  . Transportation needs - medical: None  . Transportation needs - non-medical: None  Occupational History  . None  Tobacco Use  . Smoking status: Current Some Day Smoker    Packs/day: 0.50    Years: 50.00    Pack years: 25.00    Types: Cigarettes    Start date: 04/17/1961  . Smokeless tobacco: Never Used  . Tobacco comment:  5 ciggs per week / every other day   Substance and Sexual Activity  . Alcohol use: No    Alcohol/week: 0.0 oz    Comment: No EtOH for 7 years  . Drug use: No  . Sexual activity: Yes  Other Topics Concern  . None  Social History Narrative  . None     PHYSICAL EXAMINATION  ECOG PERFORMANCE STATUS: 1 - Symptomatic but completely ambulatory  Vitals:   02/19/17 1030  Pulse: (!) 102  Resp: 20  Temp: 98.4 F (36.9 C)  SpO2: 94%    GENERAL:alert, no distress, well nourished, well  developed, comfortable, cooperative, obese, smiling and unaccompanied SKIN: skin color, texture, turgor are normal, no rashes or significant lesions HEAD: Normocephalic, No masses, lesions, tenderness or abnormalities EYES: normal, EOMI, Conjunctiva are pink and non-injected EARS: External ears normal OROPHARYNX:lips, buccal mucosa, and tongue normal and mucous membranes are moist  NECK: supple, trachea midline LYMPH:  no palpable lymphadenopathy BREAST:not examined LUNGS: clear to auscultation  HEART: regular rate & rhythm ABDOMEN:abdomen soft and obese BACK: Back symmetric, no curvature. EXTREMITIES:less then 2 second capillary refill, no skin discoloration, no cyanosis, positive findings:  edema trace pitting edema in B/L LE.  NEURO: alert & oriented x 3 with fluent speech, no focal motor/sensory deficits, gait normal   LABORATORY DATA: CBC    Component Value Date/Time   WBC 11.6 (H) 02/19/2017 0851   RBC 7.31 (H) 02/19/2017 0851   HGB 14.4 02/19/2017 0851   HCT 54.8 (H) 02/19/2017 0851   PLT 254 02/19/2017 0851   MCV 75.0 (L) 02/19/2017 0851   MCH 19.7 (L) 02/19/2017 0851   MCHC 26.3 (L) 02/19/2017 0851   RDW 21.3 (H) 02/19/2017 0851   LYMPHSABS 1.0 02/19/2017 0851   MONOABS 0.5 02/19/2017 0851   EOSABS 0.0 02/19/2017 0851   BASOSABS 0.0 02/19/2017 0851      Chemistry      Component Value Date/Time   NA 138 02/19/2017 0851   K 3.8 02/19/2017 0851   CL 90 (L) 02/19/2017 0851   CO2 38 (H) 02/19/2017 0851   BUN 28 (H) 02/19/2017 0851   CREATININE 1.34 (H) 02/19/2017 0851   CREATININE 1.43 (H) 10/27/2015 0919      Component Value Date/Time   CALCIUM 8.8 (L) 02/19/2017 0851   ALKPHOS 60 04/29/2016 1233   AST 17 04/29/2016 1233   ALT 15 (L) 04/29/2016 1233   BILITOT 0.4 04/29/2016 1233     Lab Results  Component Value Date   FERRITIN 6 (L) 10/17/2016    PENDING LABS: CBC  has been reviewed.  Patient has a high hematocrit of 54  may recommend therapeutic  phlebotomy and reevaluation in 4 months   RADIOGRAPHIC STUDIES:  No results found.   PATHOLOGY:    ASSESSMENT AND PLAN:  No problem-specific Assessment & Plan notes found for this encounter.   ORDERS PLACED FOR THIS ENCOUNTER: Therapeutic phlebotomy day MEDICATIONS PRESCRIBED THIS ENCOUNTER: No orders of the defined types were placed in this encounter.   THERAPY PLAN:  Continue to monitor HCT and maintain it at 50% or less with therapeutic phlebotomies.  Part of treatment/management is maintain iron deficiency as well to decrease erythropoiesis.    This note is electronically signed by: Forest Gleason, MD 02/19/2017 10:55 AM

## 2017-02-21 ENCOUNTER — Encounter (HOSPITAL_COMMUNITY): Payer: Medicare Other

## 2017-02-21 ENCOUNTER — Other Ambulatory Visit (HOSPITAL_COMMUNITY): Payer: Self-pay | Admitting: Adult Health

## 2017-02-21 DIAGNOSIS — D751 Secondary polycythemia: Secondary | ICD-10-CM

## 2017-02-22 ENCOUNTER — Encounter (HOSPITAL_COMMUNITY): Payer: Self-pay

## 2017-02-22 ENCOUNTER — Encounter (HOSPITAL_COMMUNITY)
Admission: RE | Admit: 2017-02-22 | Discharge: 2017-02-22 | Disposition: A | Discharge status: Home / Self Care | Payer: Medicare Other | Source: Ambulatory Visit | Attending: Oncology | Admitting: Oncology

## 2017-02-22 DIAGNOSIS — D751 Secondary polycythemia: Secondary | ICD-10-CM | POA: Insufficient documentation

## 2017-02-22 NOTE — Progress Notes (Signed)
[facility] presents today for phlebotomy per MD orders. HGB/HCT  14.4 and 54.8 Phlebotomy procedure started at [facility] and ended at [phone].  1 lb 7.7 ounces removed. Patient tolerated procedure well. IV needle removed intact.

## 2017-03-01 ENCOUNTER — Encounter (HOSPITAL_COMMUNITY): Payer: Medicare Other

## 2017-03-02 ENCOUNTER — Encounter (HOSPITAL_COMMUNITY): Payer: Medicare Other

## 2017-03-28 ENCOUNTER — Emergency Department (HOSPITAL_COMMUNITY): Payer: Medicare Other

## 2017-03-28 ENCOUNTER — Emergency Department (HOSPITAL_COMMUNITY)
Admission: EM | Admit: 2017-03-28 | Discharge: 2017-03-28 | Disposition: A | Discharge status: Home / Self Care | Payer: Medicare Other | Attending: Emergency Medicine | Admitting: Emergency Medicine

## 2017-03-28 ENCOUNTER — Other Ambulatory Visit: Payer: Self-pay

## 2017-03-28 ENCOUNTER — Encounter (HOSPITAL_COMMUNITY): Payer: Self-pay | Admitting: *Deleted

## 2017-03-28 DIAGNOSIS — Z7982 Long term (current) use of aspirin: Secondary | ICD-10-CM | POA: Insufficient documentation

## 2017-03-28 DIAGNOSIS — F17210 Nicotine dependence, cigarettes, uncomplicated: Secondary | ICD-10-CM | POA: Insufficient documentation

## 2017-03-28 DIAGNOSIS — J441 Chronic obstructive pulmonary disease with (acute) exacerbation: Secondary | ICD-10-CM | POA: Diagnosis not present

## 2017-03-28 DIAGNOSIS — R2243 Localized swelling, mass and lump, lower limb, bilateral: Secondary | ICD-10-CM | POA: Diagnosis present

## 2017-03-28 DIAGNOSIS — I110 Hypertensive heart disease with heart failure: Secondary | ICD-10-CM | POA: Diagnosis not present

## 2017-03-28 DIAGNOSIS — R609 Edema, unspecified: Secondary | ICD-10-CM

## 2017-03-28 DIAGNOSIS — I509 Heart failure, unspecified: Secondary | ICD-10-CM | POA: Diagnosis not present

## 2017-03-28 DIAGNOSIS — Z79899 Other long term (current) drug therapy: Secondary | ICD-10-CM | POA: Insufficient documentation

## 2017-03-28 DIAGNOSIS — R600 Localized edema: Secondary | ICD-10-CM

## 2017-03-28 LAB — BASIC METABOLIC PANEL
ANION GAP: 12 (ref 5–15)
BUN: 23 mg/dL — AB (ref 6–20)
CHLORIDE: 91 mmol/L — AB (ref 101–111)
CO2: 35 mmol/L — ABNORMAL HIGH (ref 22–32)
Calcium: 9.1 mg/dL (ref 8.9–10.3)
Creatinine, Ser: 1.3 mg/dL — ABNORMAL HIGH (ref 0.61–1.24)
GFR calc Af Amer: >60 mL/min (ref >60)
GFR, EST NON AFRICAN AMERICAN: 55 mL/min — AB (ref >60)
GLUCOSE: 103 mg/dL — AB (ref 65–99)
Potassium: 3.8 mmol/L (ref 3.5–5.1)
Sodium: 138 mmol/L (ref 135–145)

## 2017-03-28 LAB — CBC
HCT: 50.1 % (ref 39.0–52.0)
HEMOGLOBIN: 12.7 g/dL — AB (ref 13.0–17.0)
MCH: 19.4 pg — ABNORMAL LOW (ref 26.0–34.0)
MCHC: 25.3 g/dL — AB (ref 30.0–36.0)
MCV: 76.6 fL — AB (ref 78.0–100.0)
Platelets: 216 10*3/uL (ref 150–400)
RBC: 6.54 MIL/uL — ABNORMAL HIGH (ref 4.22–5.81)
RDW: 20.7 % — AB (ref 11.5–15.5)
WBC: 10 10*3/uL (ref 4.0–10.5)

## 2017-03-28 LAB — TROPONIN I
Troponin I: 0.03 ng/mL (ref <0.03)
Troponin I: 0.03 ng/mL (ref <0.03)

## 2017-03-28 LAB — BRAIN NATRIURETIC PEPTIDE: B Natriuretic Peptide: 74 pg/mL (ref 0.0–100.0)

## 2017-03-28 MED ORDER — PREDNISONE 20 MG PO TABS: ORAL_TABLET | ORAL | 0 refills | Status: DC

## 2017-03-28 MED ORDER — FUROSEMIDE 10 MG/ML IJ SOLN
80.0000 mg | Freq: Once | INTRAMUSCULAR | Status: AC
  Administered 2017-03-28: 80 mg via INTRAVENOUS
  Filled 2017-03-28: qty 8

## 2017-03-28 MED ORDER — ALBUTEROL SULFATE (2.5 MG/3ML) 0.083% IN NEBU
2.5000 mg | INHALATION_SOLUTION | Freq: Once | RESPIRATORY_TRACT | Status: AC
  Administered 2017-03-28: 2.5 mg via RESPIRATORY_TRACT
  Filled 2017-03-28: qty 3

## 2017-03-28 MED ORDER — IPRATROPIUM-ALBUTEROL 0.5-2.5 (3) MG/3ML IN SOLN
3.0000 mL | Freq: Once | RESPIRATORY_TRACT | Status: AC
  Administered 2017-03-28: 3 mL via RESPIRATORY_TRACT
  Filled 2017-03-28: qty 3

## 2017-03-28 MED ORDER — PREDNISONE 50 MG PO TABS
60.0000 mg | ORAL_TABLET | Freq: Once | ORAL | Status: AC
  Administered 2017-03-28: 60 mg via ORAL
  Filled 2017-03-28: qty 1

## 2017-03-28 NOTE — ED Notes (Signed)
sats on room air 81%. Oxygen 2L applied and 02 at 96%

## 2017-03-28 NOTE — ED Notes (Signed)
Date and time results received: 03/28/17 1:24 PM  (use smartphrase ".now" to insert current time)  Test: Troponin  Critical Value: 0.03  Name of Provider Notified: Thurnell Garbe  Orders Received? Or Actions Taken?: Orders Received - See Orders for details

## 2017-03-28 NOTE — ED Triage Notes (Signed)
Swelling of feet and legs for quite awhile, getting worse

## 2017-03-28 NOTE — Discharge Instructions (Signed)
Increase your torsemide/Demadex to 4 tablets a day instead of 3.  Do this for the next 3 days and then go back to your normal dose.  Also use your albuterol inhaler every 4 hours for the next 48 hours to help with your COPD.  Take the prednisone as prescribed to completion.

## 2017-03-28 NOTE — ED Provider Notes (Signed)
[facility] EMERGENCY DEPARTMENT Provider Note   CSN: [id number] Arrival date & time: 03/28/17  1110     History   Chief Complaint Chief Complaint  Patient presents with  . Leg Swelling    HPI Brian Parsons is a 66 y.o. male.  HPI  66 year old male with a history of COPD presents with leg swelling and shortness of breath.  Patient states he has been compliant with his torsemide but over the last 1 week has had progressive leg swelling and abdominal swelling.  Has also has shortness of breath during this time.  He has a chronic cough with white sputum that is unchanged.  No fevers or chest pain or chest tightness.  Has been taking his albuterol and Advair as prescribed.  He states he has home oxygen that he uses as needed.  Past Medical History:  Diagnosis Date  . Bilateral leg edema   . CHF (congestive heart failure) ([place])   . COPD (chronic obstructive pulmonary disease) ([place])   . Coronary atherosclerosis    Nonobstructive at cardiac catheterization April 2017  . Essential hypertension   . On home O2    2L N/C  . Polycythemia     Patient Active Problem List   Diagnosis Date Noted  . CHF, acute on chronic ([place]) 04/29/2016  . AKI (acute kidney injury) ([place]) 04/29/2016  . CHF exacerbation ([place]) 04/29/2016  . CHF (congestive heart failure) ([place]) 02/24/2016  . Exertional dyspnea   . Abnormal nuclear stress test 08/04/2015  . Chronic diastolic heart failure ([place]) 08/04/2015  . Essential hypertension 08/04/2015  . Polycythemia, secondary 08/04/2015  . COPD exacerbation ([place]) 08/04/2015  . Tobacco abuse 08/02/2015  . Acute on chronic renal insufficiency 07/28/2015  . Acute diastolic CHF (congestive heart failure) ([place]) 07/28/2015  . Acute on chronic respiratory failure ([place]) 08/04/2014  . COPD (chronic obstructive pulmonary disease) ([place]) 08/03/2014  . Hypoxia 08/03/2014  . Lower extremity edema 08/03/2014  . Elevated troponin 08/03/2014  . Dyspnea 08/03/2014     Past Surgical History:  Procedure Laterality Date  . CARDIAC CATHETERIZATION N/A 08/10/2015   Procedure: Left Heart Cath and Coronary Angiography;  Surgeon: Troy Sine, MD;  Location: [facility] CV LAB;  Service: Cardiovascular;  Laterality: N/A;       Home Medications    Prior to Admission medications   Medication Sig Start Date End Date Taking? Authorizing Provider  ADVAIR DISKUS 250-50 MCG/DOSE AEPB USE 1 INHALATION BY MOUTH EVERY 12 HOURS. <<RINSE MOUTH AFTER USE>>. 10/28/14  Yes Penland, Kelby Fam, MD  aspirin EC 81 MG tablet Take 81 mg by mouth daily.   Yes [provider]  atorvastatin (LIPITOR) 20 MG tablet Take 20 mg by mouth daily at 6 PM.  03/01/17 03/01/18 Yes [provider]  escitalopram (LEXAPRO) 10 MG tablet Take 10 mg by mouth at bedtime.  08/29/16  Yes [provider]  ipratropium-albuterol (DUONEB) 0.5-2.5 (3) MG/3ML SOLN Take 3 mLs by nebulization every 6 (six) hours as needed. 07/31/16  Yes Hosie Poisson, MD  lisinopril (PRINIVIL,ZESTRIL) 20 MG tablet Take 0.5 tablets (10 mg total) by mouth daily. 08/07/14  Yes Kathie Dike, MD  MAGNESIUM PO Take 1 tablet by mouth daily.   Yes [provider]  nicotine (NICODERM CQ) 21 mg/24hr patch Place 1 patch (21 mg total) onto the skin daily. 08/17/15  Yes Lendon Colonel, NP  potassium chloride (MICRO-K) 10 MEQ CR capsule Take 1 capsule by mouth daily. 06/29/16  Yes [provider]  torsemide (DEMADEX) 20 MG tablet Take 60 mg by mouth daily.   Yes [provider]  vitamin B-12 (CYANOCOBALAMIN) 1000 MCG tablet Take 1,000 mcg by mouth daily.   Yes [provider]  predniSONE (DELTASONE) 20 MG tablet 2 tabs po daily x 4 days 03/29/17   Sherwood Gambler, MD    Family History Family History  Problem Relation Age of Onset  . Stroke Mother   . Emphysema Father     Social History Social History   Tobacco Use  . Smoking status: Current Some Day Smoker     Packs/day: 0.50    Years: 50.00    Pack years: 25.00    Types: Cigarettes    Start date: 04/17/1961  . Smokeless tobacco: Never Used  . Tobacco comment:  5 ciggs per week / every other day   Substance Use Topics  . Alcohol use: No    Alcohol/week: 0.0 oz    Comment: No EtOH for 7 years  . Drug use: No     Allergies   Amoxicillin and Penicillins   Review of Systems Review of Systems  Constitutional: Negative for fever.  Respiratory: Positive for cough (chronic, unchanged) and shortness of breath.   Cardiovascular: Positive for leg swelling. Negative for chest pain.  Gastrointestinal: Positive for abdominal distention. Negative for abdominal pain.  All other systems reviewed and are negative.    Physical Exam Updated Vital Signs BP 124/70   Pulse 92   Temp 98.2 F (36.8 C) (Oral)   Resp (!) 30   Ht 5\' 7"  (1.702 m)   Wt 106.1 kg (234 lb)   SpO2 100%   BMI 36.65 kg/m   Physical Exam  Constitutional: He is oriented to person, place, and time. He appears well-developed and well-nourished.  HENT:  Head: Normocephalic and atraumatic.  Right Ear: External ear normal.  Left Ear: External ear normal.  Nose: Nose normal.  Eyes: Right eye exhibits no discharge. Left eye exhibits no discharge.  Neck: Neck supple.  Cardiovascular: Normal rate, regular rhythm and normal heart sounds.  Pulmonary/Chest: Effort normal. No accessory muscle usage. No respiratory distress. He has decreased breath sounds. He has wheezes (mild, expiratory).  Abdominal: Soft. He exhibits no distension. There is no tenderness.  Musculoskeletal: He exhibits edema (pitting edema in BLE from feet to lower legs at knees).  Neurological: He is alert and oriented to person, place, and time.  Skin: Skin is warm and dry.  Nursing note and vitals reviewed.    ED Treatments / Results  Labs (all labs ordered are listed, but only abnormal results are displayed) Labs Reviewed  BASIC METABOLIC PANEL -  Abnormal; Notable for the following components:      Result Value   Chloride 91 (*)    CO2 35 (*)    Glucose, Bld 103 (*)    BUN 23 (*)    Creatinine, Ser 1.30 (*)    GFR calc non Af Amer 55 (*)    All other components within normal limits  CBC - Abnormal; Notable for the following components:   RBC 6.54 (*)    Hemoglobin 12.7 (*)    MCV 76.6 (*)    MCH 19.4 (*)    MCHC 25.3 (*)    RDW 20.7 (*)    All other components within normal limits  TROPONIN I - Abnormal; Notable for the following components:   Troponin I 0.03 (*)    All other components within normal limits  TROPONIN I - Abnormal; Notable for the following components:   Troponin I 0.03 (*)    All other components within normal limits  BRAIN NATRIURETIC PEPTIDE    EKG  EKG Interpretation  Date/Time:  Wednesday March 28 2017 11:57:59 EST Ventricular Rate:  96 PR Interval:  160 QRS Duration: 80 QT Interval:  360 QTC Calculation: 454 R Axis:   75 Text Interpretation:  Sinus rhythm with Premature atrial complexes ST & T wave abnormality, consider inferior ischemia ST & T wave abnormality, consider anterolateral ischemia Abnormal ECG ST/T changes similar to April 2018 Confirmed by Sherwood Gambler [phone]) on 03/28/2017 1:42:13 PM       Radiology Dg Chest 2 View  Result Date: 03/28/2017 CLINICAL DATA:  Bilateral foot and ankle swelling for the past week. Productive cough and shortness of breath. Current smoker. History of CHF, [place] OPD EXAM: CHEST  2 VIEW COMPARISON:  Chest x-ray of July 27, 2016 FINDINGS: The lungs are adequately inflated. The interstitial markings are coarse but have improved since the previous study. The cardiac silhouette is mildly enlarged. The central pulmonary vascularity is prominent but there is no definite cephalization. The mediastinum is normal in width. There is no pleural effusion. The bony thorax is unremarkable. The patient has undergone previous resection of the lateral thirds of  both clavicles. IMPRESSION: Chronic bronchitic changes. Mild enlargement of the cardiac silhouette. Mild pulmonary interstitial prominence has improved since the previous study and likely reflects low-grade compensated CHF. Electronically Signed   By: David  Martinique M.D.   On: 03/28/2017 12:33    Procedures Procedures (including critical care time)  Medications Ordered in ED Medications  furosemide (LASIX) injection 80 mg (80 mg Intravenous Given 03/28/17 1356)  ipratropium-albuterol (DUONEB) 0.5-2.5 (3) MG/3ML nebulizer solution 3 mL (3 mLs Nebulization Given 03/28/17 1419)  albuterol (PROVENTIL) (2.5 MG/3ML) 0.083% nebulizer solution 2.5 mg (2.5 mg Nebulization Given 03/28/17 1419)  predniSONE (DELTASONE) tablet 60 mg (60 mg Oral Given 03/28/17 1531)     Initial Impression / Assessment and Plan / ED Course  I have reviewed the triage vital signs and the nursing notes.  Pertinent labs & imaging results that were available during my care of the patient were reviewed by me and considered in my medical decision making (see chart for details).     Chart review shows that the patient has some grade 2 diastolic dysfunction but otherwise no systolic CHF.  He had a left heart cath on April 2017 that showed no significant coronary disease.  My suspicion that this is cardiac related is low.  Notes indicate his CHF-like symptoms and edema are likely due to COPD and high pulmonary pressures.  He does appear to have a COPD exacerbation although this is mild to moderate.  He was given prednisone and albuterol with Atrovent.  His decreased breath sounds and mild wheezing have turned into good air movement with significant wheezing.  He feels much better.  He has oxygen at home and was instructed to use this as needed as instructed.  Otherwise he has a mild troponin elevation that is stable and similar to prior.  It is not increasing in the ED.  Suspicion for ACS is quite low.  Patient feels well enough for  discharge and he will be treated for COPD with albuterol and prednisone at home and for mild edema with increase in his torsemide over the next few days.  Follow-up closely with PCP.  Final Clinical Impressions(s) / ED Diagnoses   Final  diagnoses:  COPD exacerbation ([place])  Peripheral edema    ED Discharge Orders        Ordered    predniSONE (DELTASONE) 20 MG tablet     03/28/17 1624       Sherwood Gambler, MD 03/28/17 1640

## 2017-03-28 NOTE — ED Notes (Signed)
Pt evaluated by EDP prior to nursing assessment. 

## 2017-04-29 IMAGING — DX DG CHEST 2V
2 series · 2 of 2 positions shown · non-contrast
Comparison: 02/27/2016

CLINICAL DATA: SOB and swelling from knees down, SOB worsening last
night, hx of COPD and CHF

EXAM:
CHEST  2 VIEW

[chest pa]
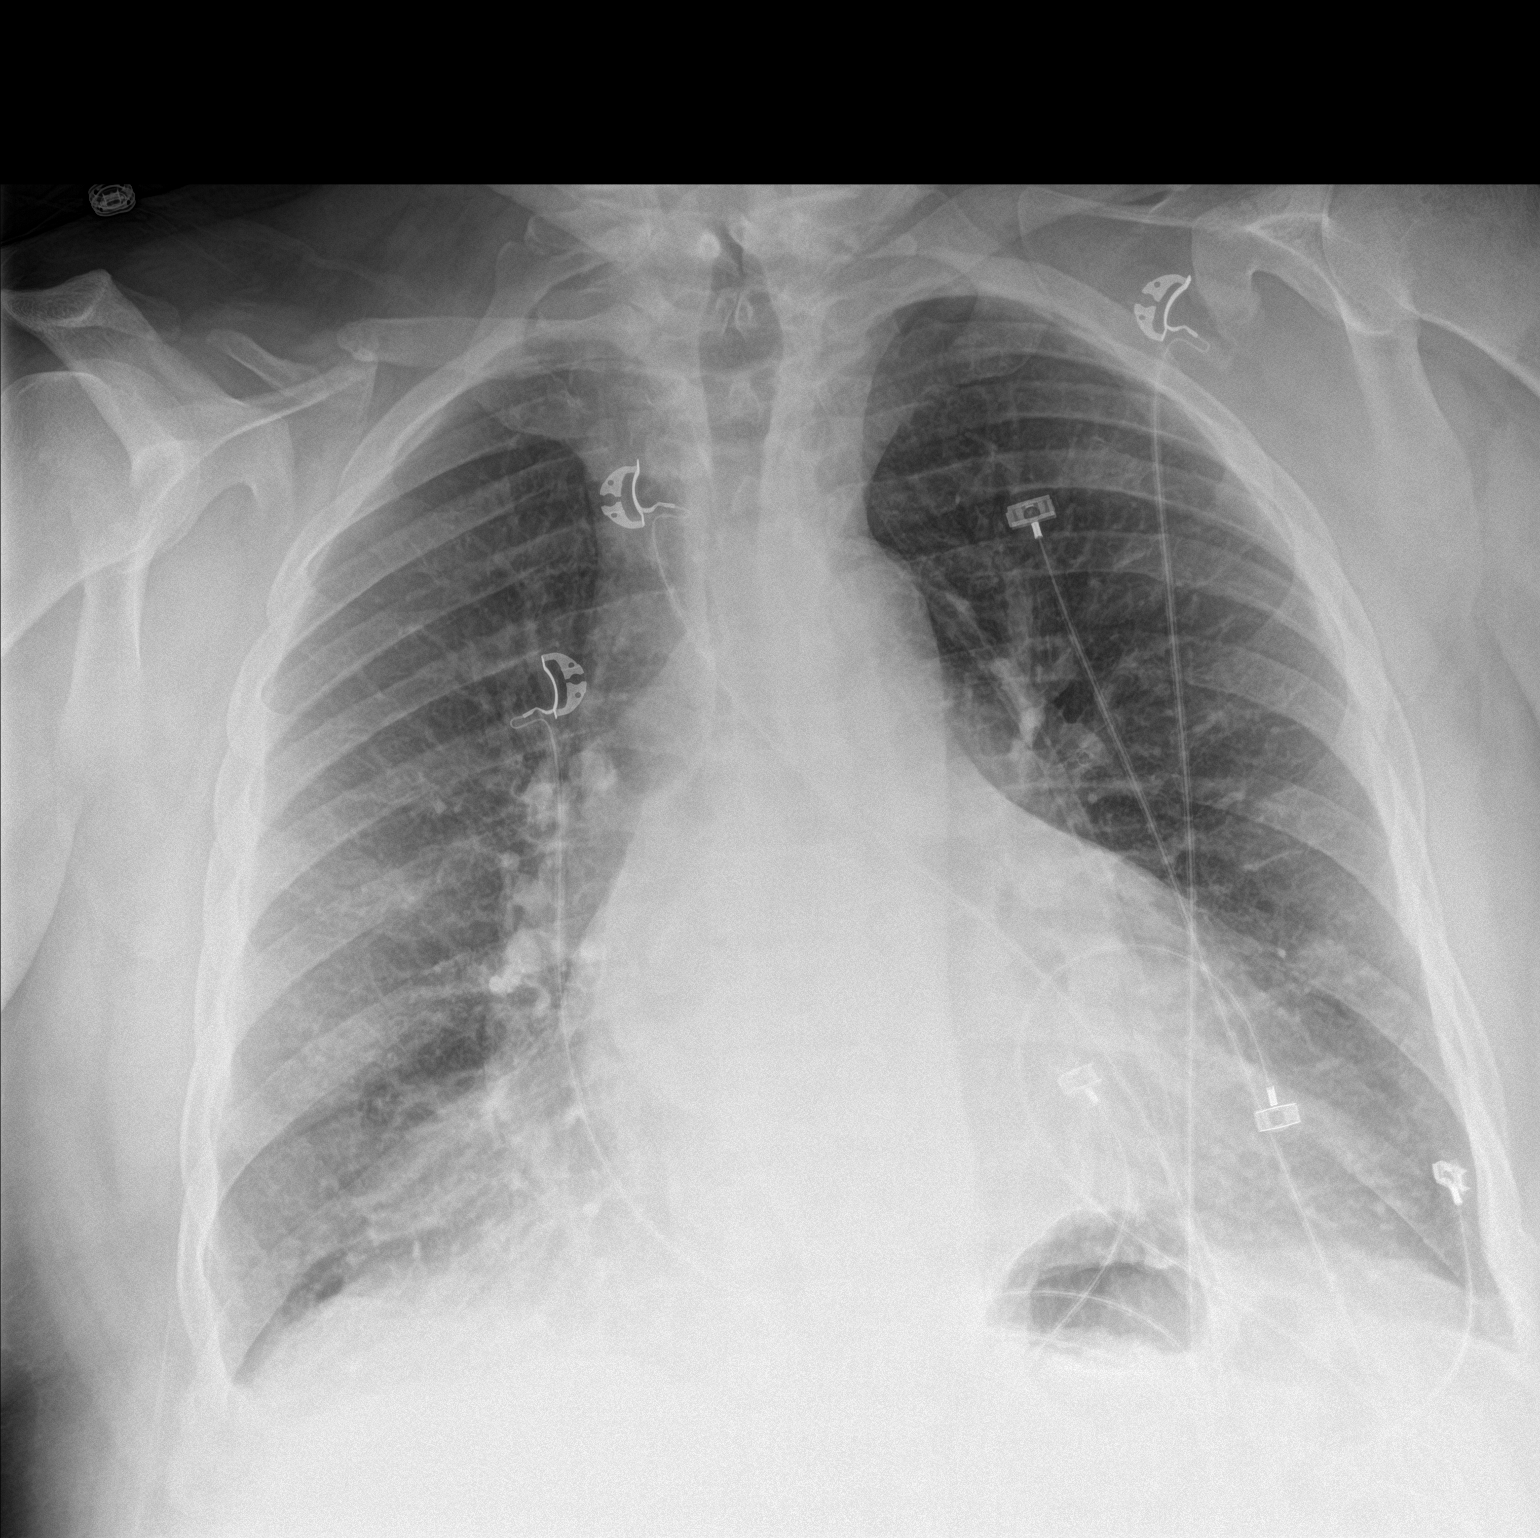

[chest lat]
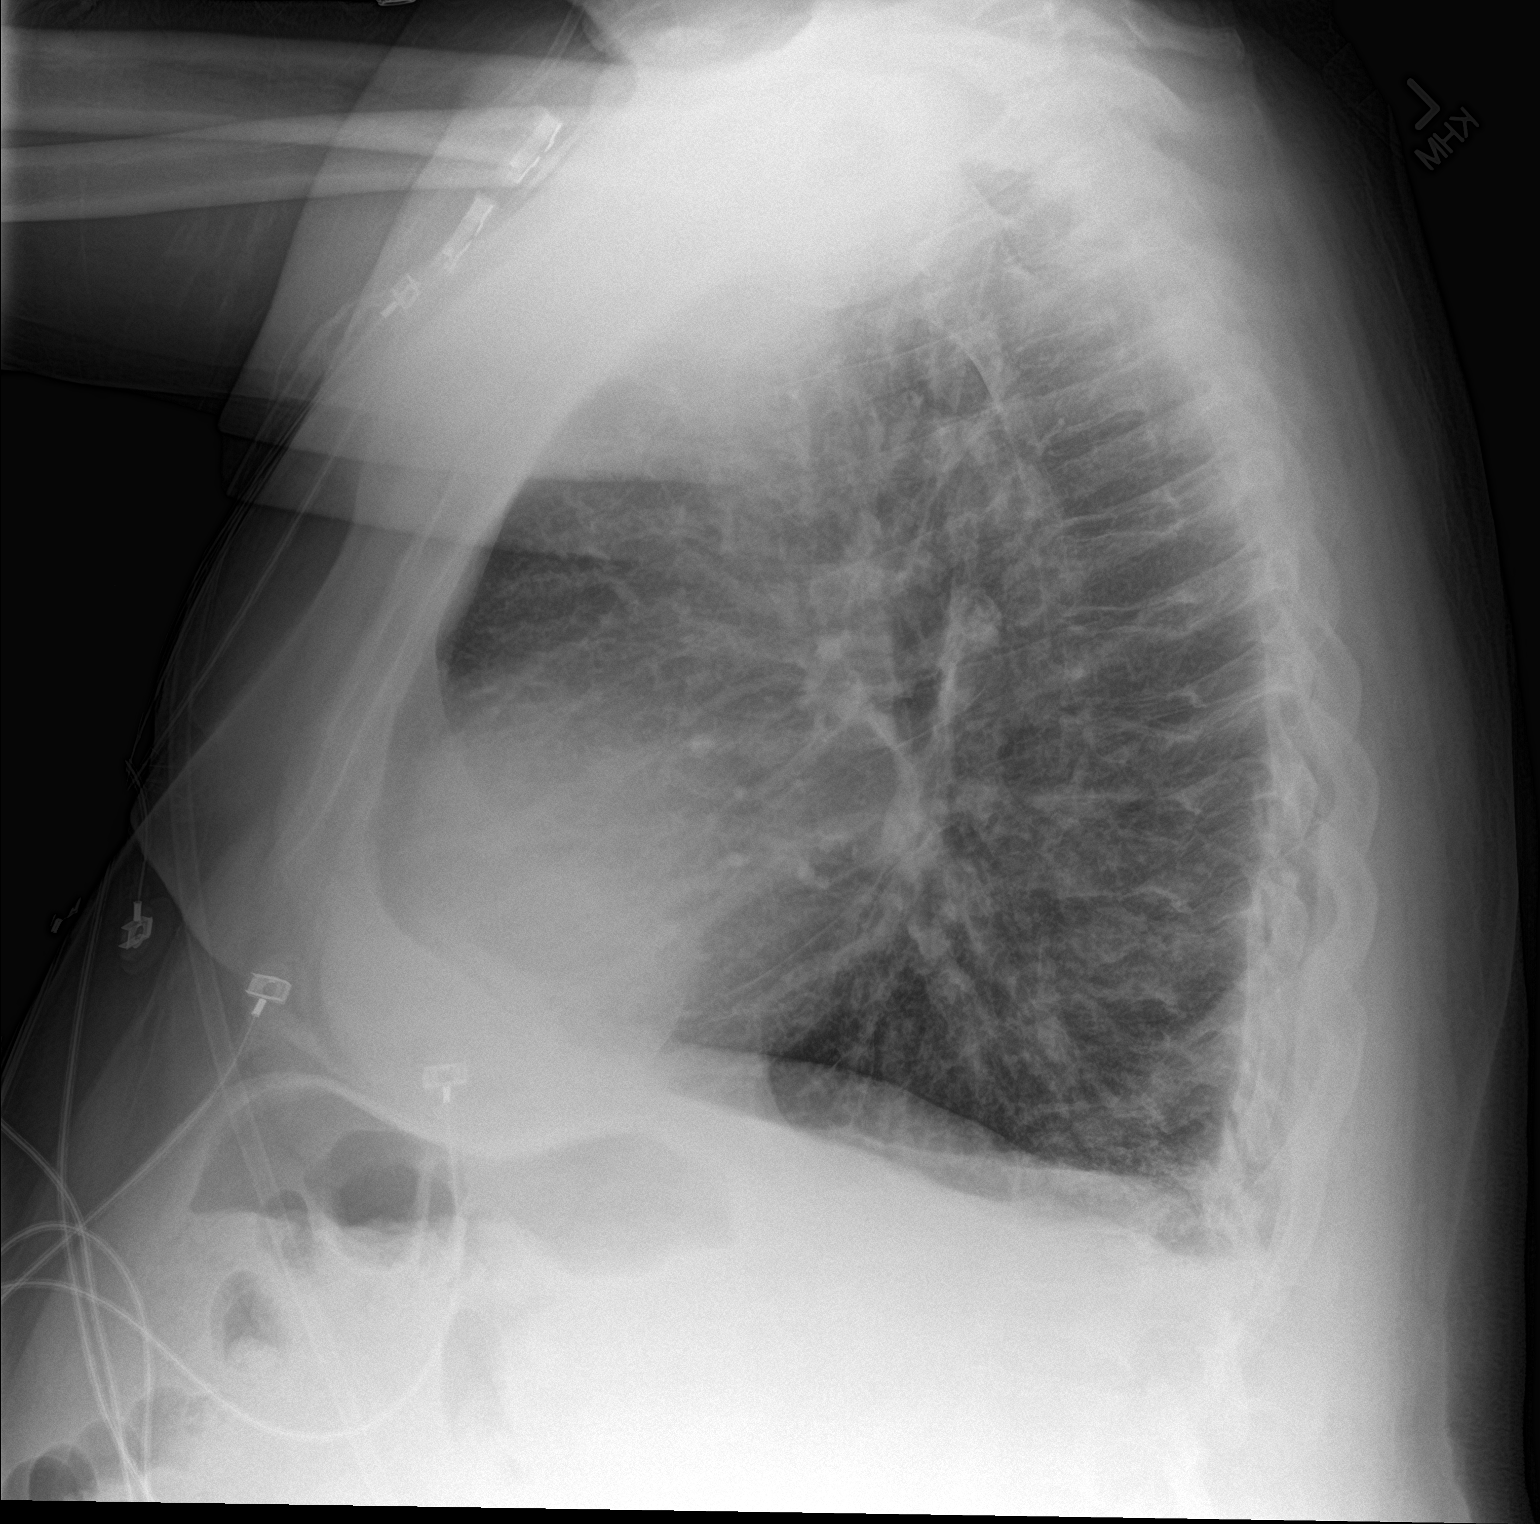

[2 of 2 positions shown; findings below may reference images not displayed]

FINDINGS: Cardiac silhouette is mildly enlarged. No mediastinal or hilar
masses. No evidence of adenopathy.

There are prominent bronchovascular markings most evident in the
lower lungs. There is no lung consolidation to suggest pneumonia. No
pulmonary edema.

No pleural effusion pneumothorax.

There has been a previous resection of the distal right clavicle,
stable.
IMPRESSION: No acute cardiopulmonary disease

## 2017-06-11 ENCOUNTER — Other Ambulatory Visit (HOSPITAL_COMMUNITY): Payer: Self-pay | Admitting: *Deleted

## 2017-06-11 DIAGNOSIS — D751 Secondary polycythemia: Secondary | ICD-10-CM

## 2017-06-11 DIAGNOSIS — D45 Polycythemia vera: Secondary | ICD-10-CM

## 2017-06-12 ENCOUNTER — Other Ambulatory Visit (HOSPITAL_COMMUNITY): Payer: Self-pay | Admitting: Adult Health

## 2017-06-12 ENCOUNTER — Inpatient Hospital Stay (HOSPITAL_COMMUNITY): Payer: Medicare Other | Attending: Internal Medicine

## 2017-06-12 DIAGNOSIS — D751 Secondary polycythemia: Secondary | ICD-10-CM

## 2017-06-12 LAB — BASIC METABOLIC PANEL
ANION GAP: 10 (ref 5–15)
BUN: 18 mg/dL (ref 6–20)
CALCIUM: 9 mg/dL (ref 8.9–10.3)
CO2: 35 mmol/L — AB (ref 22–32)
Chloride: 87 mmol/L — ABNORMAL LOW (ref 101–111)
Creatinine, Ser: 1.2 mg/dL (ref 0.61–1.24)
Glucose, Bld: 106 mg/dL — ABNORMAL HIGH (ref 65–99)
Potassium: 4.2 mmol/L (ref 3.5–5.1)
Sodium: 132 mmol/L — ABNORMAL LOW (ref 135–145)

## 2017-06-12 LAB — CBC WITH DIFFERENTIAL/PLATELET
BASOS ABS: 0 10*3/uL (ref 0.0–0.1)
Basophils Relative: 0 %
Eosinophils Absolute: 0.1 10*3/uL (ref 0.0–0.7)
Eosinophils Relative: 1 %
HEMATOCRIT: 51.9 % (ref 39.0–52.0)
Hemoglobin: 13.6 g/dL (ref 13.0–17.0)
LYMPHS ABS: 2.2 10*3/uL (ref 0.7–4.0)
LYMPHS PCT: 23 %
MCH: 19.6 pg — ABNORMAL LOW (ref 26.0–34.0)
MCHC: 26.2 g/dL — ABNORMAL LOW (ref 30.0–36.0)
MCV: 74.7 fL — ABNORMAL LOW (ref 78.0–100.0)
MONOS PCT: 9 %
Monocytes Absolute: 0.9 10*3/uL (ref 0.1–1.0)
NEUTROS PCT: 67 %
Neutro Abs: 6.4 10*3/uL (ref 1.7–7.7)
Platelets: 198 10*3/uL (ref 150–400)
RBC: 6.95 MIL/uL — AB (ref 4.22–5.81)
RDW: 21.3 % — AB (ref 11.5–15.5)
WBC: 9.6 10*3/uL (ref 4.0–10.5)

## 2017-06-19 ENCOUNTER — Inpatient Hospital Stay (HOSPITAL_COMMUNITY): Payer: Medicare Other | Attending: Internal Medicine | Admitting: Internal Medicine

## 2017-06-19 ENCOUNTER — Encounter (HOSPITAL_COMMUNITY): Payer: Self-pay | Admitting: Internal Medicine

## 2017-06-19 VITALS — BP 113/59 | HR 80 | Temp 98.3°F | Resp 20 | Wt 234.4 lb

## 2017-06-19 DIAGNOSIS — D751 Secondary polycythemia: Secondary | ICD-10-CM | POA: Insufficient documentation

## 2017-06-19 DIAGNOSIS — F17210 Nicotine dependence, cigarettes, uncomplicated: Secondary | ICD-10-CM | POA: Insufficient documentation

## 2017-06-19 DIAGNOSIS — I10 Essential (primary) hypertension: Secondary | ICD-10-CM

## 2017-06-19 NOTE — Patient Instructions (Addendum)
[facility] at [facility] Discharge Instructions  You were seen today by Dr. Walden Field. Phlebotomy not needed at this time. Levels elevated due to your smoking. We will set you up for screening CT to recheck your lungs. We will see you back in 4 months for labs and follow up.  Please cut back on your smoking and try to stop.  Thank you for choosing [facility] to provide your oncology and hematology care.  To afford each patient quality time with our provider, please arrive at least 15 minutes before your scheduled appointment time.   If you have a lab appointment with the [facility] please come in thru the  Main Entrance and check in at the main information desk  You need to re-schedule your appointment should you arrive 10 or more minutes late.  We strive to give you quality time with our providers, and arriving late affects you and other patients whose appointments are after yours.  Also, if you no show three or more times for appointments you may be dismissed from the clinic at the providers discretion.     Again, thank you for choosing [facility].  Our hope is that these requests will decrease the amount of time that you wait before being seen by our physicians.       _____________________________________________________________  Should you have questions after your visit to [facility], please contact our office at [phone] between the hours of 8:30 a.m. and 4:30 p.m.  Voicemails left after 4:30 p.m. will not be returned until the following business day.  For prescription refill requests, have your pharmacy contact our office.       Resources For Cancer Patients and their Caregivers ? American Cancer Society: Can assist with transportation, wigs, general needs, runs Look Good Feel Better.        838-640-2186 ? Cancer Care: Provides financial assistance, online support groups, medication/co-pay assistance.   1-800-813-HOPE 380-746-0668) ? [place] Assists [place] cancer patients and their families through emotional , educational and financial support.  (906)686-0187 ? [place] DSS Where to apply for food stamps, Medicaid and utility assistance. 819-539-0340 ? [facility]: Transportation to medical appointments. (780)066-5011 ? Social Security Administration: May apply for disability if have a Stage IV cancer. 306-868-2601 9310244785 ? [employer], Disability and Transit Services: Assists with nutrition, care and transit needs. [place] Support Programs:   > Cancer Support Group  2nd Tuesday of the month 1pm-2pm, Journey Room   > Creative Journey  3rd Tuesday of the month 1130am-1pm, Journey Room

## 2017-06-20 ENCOUNTER — Encounter (HOSPITAL_COMMUNITY): Payer: Medicare Other

## 2017-06-21 ENCOUNTER — Other Ambulatory Visit: Payer: Self-pay | Admitting: Acute Care

## 2017-06-21 DIAGNOSIS — F17210 Nicotine dependence, cigarettes, uncomplicated: Secondary | ICD-10-CM

## 2017-06-21 DIAGNOSIS — Z122 Encounter for screening for malignant neoplasm of respiratory organs: Secondary | ICD-10-CM

## 2017-07-02 ENCOUNTER — Telehealth: Payer: Self-pay | Admitting: Acute Care

## 2017-07-02 NOTE — Telephone Encounter (Signed)
Will route to Lung nodule pool. Lm to make pt aware that Langley Gauss would be contacting him to reschedule.

## 2017-07-02 NOTE — Telephone Encounter (Signed)
LMTC x 1 to reschedule SDMV and CT 

## 2017-07-04 ENCOUNTER — Encounter: Payer: Medicare Other | Admitting: Acute Care

## 2017-07-04 ENCOUNTER — Ambulatory Visit: Payer: Medicare Other

## 2017-07-05 NOTE — Telephone Encounter (Signed)
Will close this message and refer to referral notes 

## 2017-07-13 NOTE — Progress Notes (Signed)
Diagnosis Polycythemia, secondary - Plan: CBC with Differential/Platelet, Comprehensive metabolic panel, Ferritin, Lactate dehydrogenase, Ambulatory Referral for Lung Cancer Screening  Staging Cancer Staging No matching staging information was found for the patient.  Assessment and Plan:  1.  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  JAK/MPL/CALR mutations are negative.  Pt had been treated in the past with phlebotomy.   Labs done 06/12/2017 Hb 13.6 HCT 51.9.  He reports he difficulty tolerating last phlebotomy.  I have discussed with him that based on diagnosis of secondary polycythemia he would not be indicated for phlebotomy Due to etiology likely being related to smoking.  He will have repeat labs done 10/2017.    2.  Smoking.   Cessation is recommended.  Pt is referred to lung cancer screening program.    3.  Hypertension.  BP is 113/59.  Continue to follow-up with PCP.     Current Status:  Pt is seen today for follow-up.  He is here to go over lab studies.    Problem List Patient Active Problem List   Diagnosis Date Noted  . CHF, acute on chronic ([place]) [I50.9] 04/29/2016  . AKI (acute kidney injury) ([place]) [N17.9] 04/29/2016  . CHF exacerbation ([place]) [I50.9] 04/29/2016  . CHF (congestive heart failure) ([place]) [I50.9] 02/24/2016  . Exertional dyspnea [R06.09]   . Abnormal nuclear stress test [R94.39] 08/04/2015  . Chronic diastolic heart failure ([place]) [I50.32] 08/04/2015  . Essential hypertension [I10] 08/04/2015  . Polycythemia, secondary [D75.1] 08/04/2015  . COPD exacerbation ([place]) [J44.1] 08/04/2015  . Tobacco abuse [Z72.0] 08/02/2015  . Acute on chronic renal insufficiency [N28.9, N18.9] 07/28/2015  . Acute diastolic CHF (congestive heart failure) ([place]) [I50.31] 07/28/2015  . Acute on chronic respiratory failure ([place]) [J96.20] 08/04/2014  . COPD (chronic obstructive pulmonary disease) ([place]) [J44.9] 08/03/2014  . Hypoxia [R09.02] 08/03/2014  .  Lower extremity edema [R60.0] 08/03/2014  . Elevated troponin [R74.8] 08/03/2014  . Dyspnea [R06.00] 08/03/2014    Past Medical History Past Medical History:  Diagnosis Date  . Bilateral leg edema   . CHF (congestive heart failure) ([place])   . COPD (chronic obstructive pulmonary disease) ([place])   . Coronary atherosclerosis    Nonobstructive at cardiac catheterization April 2017  . Essential hypertension   . On home O2    2L N/C  . Polycythemia     Past Surgical History Past Surgical History:  Procedure Laterality Date  . CARDIAC CATHETERIZATION N/A 08/10/2015   Procedure: Left Heart Cath and Coronary Angiography;  Surgeon: Troy Sine, MD;  Location: [facility];  Service: Cardiovascular;  Laterality: N/A;    Family History Family History  Problem Relation Age of Onset  . Stroke Mother   . Emphysema Father      Social History  reports that he has been smoking cigarettes.  He started smoking about 56 years ago. He has a 25.00 pack-year smoking history. He has never used smokeless tobacco. He reports that he does not drink alcohol or use drugs.  Medications  Current Outpatient Medications:  .  ADVAIR DISKUS 250-50 MCG/DOSE AEPB, USE 1 INHALATION BY MOUTH EVERY 12 HOURS. <<RINSE MOUTH AFTER USE>>., Disp: 60 each, Rfl: 3 .  aspirin EC 81 MG tablet, Take 81 mg by mouth daily., Disp: , Rfl:  .  atorvastatin (LIPITOR) 20 MG tablet, Take 20 mg by mouth daily at 6 PM. , Disp: , Rfl:  .  escitalopram (LEXAPRO) 10 MG tablet, Take 10 mg by mouth  at bedtime. , Disp: , Rfl:  .  ipratropium-albuterol (DUONEB) 0.5-2.5 (3) MG/3ML SOLN, Take 3 mLs by nebulization every 6 (six) hours as needed., Disp: 360 mL, Rfl: 2 .  lisinopril (PRINIVIL,ZESTRIL) 20 MG tablet, Take 0.5 tablets (10 mg total) by mouth daily., Disp: 30 tablet, Rfl: 1 .  MAGNESIUM PO, Take 1 tablet by mouth daily., Disp: , Rfl:  .  nicotine (NICODERM CQ) 21 mg/24hr patch, Place 1 patch (21 mg total) onto the skin  daily., Disp: 28 patch, Rfl: 0 .  potassium chloride (MICRO-K) 10 MEQ CR capsule, Take 1 capsule by mouth daily., Disp: , Rfl:  .  predniSONE (DELTASONE) 20 MG tablet, 2 tabs po daily x 4 days, Disp: 8 tablet, Rfl: 0 .  torsemide (DEMADEX) 20 MG tablet, Take 60 mg by mouth daily., Disp: , Rfl:  .  vitamin B-12 (CYANOCOBALAMIN) 1000 MCG tablet, Take 1,000 mcg by mouth daily., Disp: , Rfl:   Allergies Amoxicillin and Penicillins  Review of Systems Review of Systems - Oncology ROS as per HPI otherwise 12 point ROS is negative.   Physical Exam  Vitals Wt Readings from Last 3 Encounters:  06/19/17 234 lb 6.4 oz (106.3 kg)  03/28/17 234 lb (106.1 kg)  02/19/17 232 lb 1.6 oz (105.3 kg)   Temp Readings from Last 3 Encounters:  06/19/17 98.3 F (36.8 C) (Oral)  03/28/17 98.2 F (36.8 C) (Oral)  02/22/17 98.2 F (36.8 C) (Oral)   BP Readings from Last 3 Encounters:  06/19/17 (!) 113/59  03/28/17 (!) 124/91  02/22/17 (!) 98/57   Pulse Readings from Last 3 Encounters:  06/19/17 80  03/28/17 74  02/22/17 86   Constitutional: Well-developed, well-nourished, and in no distress.   HENT: Head: Normocephalic and atraumatic.  Mouth/Throat: No oropharyngeal exudate. Mucosa moist. Eyes: Pupils are equal, round, and reactive to light. Conjunctivae are normal. No scleral icterus.  Neck: Normal range of motion. Neck supple. No JVD present.  Cardiovascular: Normal rate, regular rhythm and normal heart sounds.  Exam reveals no gallop and no friction rub.   No murmur heard. Pulmonary/Chest: Effort normal and breath sounds normal. No respiratory distress. No wheezes.No rales.  Abdominal: Soft. Bowel sounds are normal. No distension. There is no tenderness. There is no guarding.  Musculoskeletal: No edema or tenderness.  Lymphadenopathy: No cervical, axillary or supraclavicular adenopathy.  Neurological: Alert and oriented to person, place, and time. No cranial nerve deficit.  Skin: Skin is  warm and dry. No rash noted. No erythema. No pallor.  Psychiatric: Affect and judgment normal.   Labs No visits with results within 3 Day(s) from this visit.  Latest known visit with results is:  Appointment on 06/12/2017  Component Date Value Ref Range Status  . WBC 06/12/2017 9.6  4.0 - 10.5 K/uL Final  . RBC 06/12/2017 6.95* 4.22 - 5.81 MIL/uL Final  . Hemoglobin 06/12/2017 13.6  13.0 - 17.0 g/dL Final  . HCT 06/12/2017 51.9  39.0 - 52.0 % Final  . MCV 06/12/2017 74.7* 78.0 - 100.0 fL Final  . MCH 06/12/2017 19.6* 26.0 - 34.0 pg Final  . MCHC 06/12/2017 26.2* 30.0 - 36.0 g/dL Final  . RDW 06/12/2017 21.3* 11.5 - 15.5 % Final  . Platelets 06/12/2017 198  150 - 400 K/uL Final  . Neutrophils Relative % 06/12/2017 67  % Final  . Lymphocytes Relative 06/12/2017 23  % Final  . Monocytes Relative 06/12/2017 9  % Final  . Eosinophils Relative 06/12/2017 1  %  Final  . Basophils Relative 06/12/2017 0  % Final  . Neutro Abs 06/12/2017 6.4  1.7 - 7.7 K/uL Final  . Lymphs Abs 06/12/2017 2.2  0.7 - 4.0 K/uL Final  . Monocytes Absolute 06/12/2017 0.9  0.1 - 1.0 K/uL Final  . Eosinophils Absolute 06/12/2017 0.1  0.0 - 0.7 K/uL Final  . Basophils Absolute 06/12/2017 0.0  0.0 - 0.1 K/uL Final  . RBC Morphology 06/12/2017 ANISOCYTES   Final   Performed at [facility], [address], [place], [place] [address]  . Sodium 06/12/2017 132* 135 - 145 mmol/L Final  . Potassium 06/12/2017 4.2  3.5 - 5.1 mmol/L Final  . Chloride 06/12/2017 87* 101 - 111 mmol/L Final  . CO2 06/12/2017 35* 22 - 32 mmol/L Final  . Glucose, Bld 06/12/2017 106* 65 - 99 mg/dL Final  . BUN 06/12/2017 18  6 - 20 mg/dL Final  . Creatinine, Ser 06/12/2017 1.20  0.61 - 1.24 mg/dL Final  . Calcium 06/12/2017 9.0  8.9 - 10.3 mg/dL Final  . GFR calc non Af Amer 06/12/2017 >60  >60 mL/min Final  . GFR calc Af Amer 06/12/2017 >60  >60 mL/min Final   Comment: (NOTE) The eGFR has been calculated using the CKD EPI equation. This  calculation has not been validated in all clinical situations. eGFR's persistently <60 mL/min signify possible Chronic Kidney Disease.   Georgiann Hahn gap 06/12/2017 10  5 - 15 Final   Performed at [facility], [address], [place], [place] [address]     Pathology Orders Placed This Encounter  Procedures  . CBC with Differential/Platelet    Standing Status:   Future    Standing Expiration Date:   06/20/2018  . Comprehensive metabolic panel    Standing Status:   Future    Standing Expiration Date:   06/20/2018  . Ferritin    Standing Status:   Future    Standing Expiration Date:   06/20/2018  . Lactate dehydrogenase    Standing Status:   Future    Standing Expiration Date:   06/20/2018  . Ambulatory Referral for Lung Cancer Screening    Referral Priority:   Routine    Referral Type:   Consultation    Referral Reason:   Specialty Services Required    Number of Visits Requested:   [place] MD

## 2017-09-10 ENCOUNTER — Emergency Department (HOSPITAL_COMMUNITY): Payer: Medicare Other

## 2017-09-10 ENCOUNTER — Encounter (HOSPITAL_COMMUNITY): Payer: Self-pay | Admitting: Emergency Medicine

## 2017-09-10 ENCOUNTER — Emergency Department (HOSPITAL_COMMUNITY)
Admission: EM | Admit: 2017-09-10 | Discharge: 2017-09-10 | Disposition: A | Discharge status: Home / Self Care | Payer: Medicare Other | Attending: Emergency Medicine | Admitting: Emergency Medicine

## 2017-09-10 DIAGNOSIS — J441 Chronic obstructive pulmonary disease with (acute) exacerbation: Secondary | ICD-10-CM | POA: Insufficient documentation

## 2017-09-10 DIAGNOSIS — R600 Localized edema: Secondary | ICD-10-CM | POA: Diagnosis not present

## 2017-09-10 DIAGNOSIS — I2510 Atherosclerotic heart disease of native coronary artery without angina pectoris: Secondary | ICD-10-CM | POA: Insufficient documentation

## 2017-09-10 DIAGNOSIS — Z79899 Other long term (current) drug therapy: Secondary | ICD-10-CM | POA: Diagnosis not present

## 2017-09-10 DIAGNOSIS — F17210 Nicotine dependence, cigarettes, uncomplicated: Secondary | ICD-10-CM | POA: Diagnosis not present

## 2017-09-10 DIAGNOSIS — I110 Hypertensive heart disease with heart failure: Secondary | ICD-10-CM | POA: Insufficient documentation

## 2017-09-10 DIAGNOSIS — R0602 Shortness of breath: Secondary | ICD-10-CM | POA: Diagnosis present

## 2017-09-10 DIAGNOSIS — Z7982 Long term (current) use of aspirin: Secondary | ICD-10-CM | POA: Insufficient documentation

## 2017-09-10 DIAGNOSIS — I5032 Chronic diastolic (congestive) heart failure: Secondary | ICD-10-CM | POA: Diagnosis not present

## 2017-09-10 LAB — CBC WITH DIFFERENTIAL/PLATELET
BASOS ABS: 0 10*3/uL (ref 0.0–0.1)
BASOS PCT: 0 %
EOS ABS: 0.4 10*3/uL (ref 0.0–0.7)
EOS PCT: 5 %
HCT: 54.1 % — ABNORMAL HIGH (ref 39.0–52.0)
Hemoglobin: 14.6 g/dL (ref 13.0–17.0)
LYMPHS PCT: 17 %
Lymphs Abs: 1.4 10*3/uL (ref 0.7–4.0)
MCH: 20.9 pg — ABNORMAL LOW (ref 26.0–34.0)
MCHC: 27 g/dL — ABNORMAL LOW (ref 30.0–36.0)
MCV: 77.3 fL — AB (ref 78.0–100.0)
MONO ABS: 0.8 10*3/uL (ref 0.1–1.0)
Monocytes Relative: 10 %
NEUTROS PCT: 68 %
Neutro Abs: 5.6 10*3/uL (ref 1.7–7.7)
PLATELETS: 217 10*3/uL (ref 150–400)
RBC: 7 MIL/uL — ABNORMAL HIGH (ref 4.22–5.81)
RDW: 22.3 % — AB (ref 11.5–15.5)
WBC: 8.2 10*3/uL (ref 4.0–10.5)

## 2017-09-10 LAB — BASIC METABOLIC PANEL
ANION GAP: 6 (ref 5–15)
BUN: 24 mg/dL — AB (ref 6–20)
CALCIUM: 8.7 mg/dL — AB (ref 8.9–10.3)
CO2: 41 mmol/L — ABNORMAL HIGH (ref 22–32)
Chloride: 88 mmol/L — ABNORMAL LOW (ref 101–111)
Creatinine, Ser: 1.2 mg/dL (ref 0.61–1.24)
GFR calc Af Amer: >60 mL/min (ref >60)
GFR, EST NON AFRICAN AMERICAN: 60 mL/min — AB (ref >60)
GLUCOSE: 109 mg/dL — AB (ref 65–99)
Potassium: 4 mmol/L (ref 3.5–5.1)
Sodium: 135 mmol/L (ref 135–145)

## 2017-09-10 LAB — BRAIN NATRIURETIC PEPTIDE: B Natriuretic Peptide: 39 pg/mL (ref 0.0–100.0)

## 2017-09-10 LAB — TROPONIN I
TROPONIN I: 0.03 ng/mL — AB (ref <0.03)
Troponin I: 0.04 ng/mL (ref <0.03)

## 2017-09-10 MED ORDER — IPRATROPIUM BROMIDE 0.02 % IN SOLN
1.0000 mg | Freq: Once | RESPIRATORY_TRACT | Status: AC
  Administered 2017-09-10: 1 mg via RESPIRATORY_TRACT
  Filled 2017-09-10: qty 5

## 2017-09-10 MED ORDER — METHYLPREDNISOLONE SODIUM SUCC 125 MG IJ SOLR
125.0000 mg | Freq: Once | INTRAMUSCULAR | Status: AC
  Administered 2017-09-10: 125 mg via INTRAVENOUS
  Filled 2017-09-10: qty 2

## 2017-09-10 MED ORDER — ALBUTEROL SULFATE (2.5 MG/3ML) 0.083% IN NEBU
2.5000 mg | INHALATION_SOLUTION | Freq: Once | RESPIRATORY_TRACT | Status: AC
  Administered 2017-09-10: 2.5 mg via RESPIRATORY_TRACT
  Filled 2017-09-10: qty 3

## 2017-09-10 MED ORDER — ALBUTEROL SULFATE HFA 108 (90 BASE) MCG/ACT IN AERS: 2.0000 | INHALATION_SPRAY | RESPIRATORY_TRACT | 0 refills | Status: DC | PRN

## 2017-09-10 MED ORDER — IPRATROPIUM-ALBUTEROL 0.5-2.5 (3) MG/3ML IN SOLN
3.0000 mL | Freq: Once | RESPIRATORY_TRACT | Status: AC
  Administered 2017-09-10: 3 mL via RESPIRATORY_TRACT
  Filled 2017-09-10: qty 3

## 2017-09-10 MED ORDER — ALBUTEROL SULFATE (2.5 MG/3ML) 0.083% IN NEBU: 2.5000 mg | INHALATION_SOLUTION | RESPIRATORY_TRACT | 0 refills | Status: DC | PRN

## 2017-09-10 MED ORDER — DOXYCYCLINE HYCLATE 100 MG PO TABS: 100.0000 mg | ORAL_TABLET | Freq: Two times a day (BID) | ORAL | 0 refills | Status: DC

## 2017-09-10 MED ORDER — PREDNISONE 20 MG PO TABS: 40.0000 mg | ORAL_TABLET | Freq: Every day | ORAL | 0 refills | Status: DC

## 2017-09-10 MED ORDER — ALBUTEROL (5 MG/ML) CONTINUOUS INHALATION SOLN
10.0000 mg/h | INHALATION_SOLUTION | Freq: Once | RESPIRATORY_TRACT | Status: AC
  Administered 2017-09-10: 10 mg/h via RESPIRATORY_TRACT
  Filled 2017-09-10: qty 20

## 2017-09-10 NOTE — Discharge Instructions (Addendum)
Take the prescriptions as directed.  Use your albuterol inhaler (2 to 4 puffs) or your albuterol nebulizer (1 unit dose) every 4 hours for the next 7 days, then as needed for cough, wheezing, or shortness of breath. Wear your oxygen at home. Call your regular medical doctor tomorrow morning to schedule a follow up appointment within the next 1 to 2 days.  Return to the Emergency Department immediately sooner if worsening.

## 2017-09-10 NOTE — ED Notes (Addendum)
Patients' oxygen saturation was 97% at the beginning of ambulation, and it steadily fell as we walked. By the time we returned to the room it was 90%. Patient ambulated without oxygen.

## 2017-09-10 NOTE — ED Triage Notes (Signed)
Pt reports swelling in legs and feet up into abdomen for the past week.  Reports compliance with Torsemide.

## 2017-09-10 NOTE — ED Notes (Signed)
Emptied 750 ml of urine from urinal. Patient standing at bedside. States he is having "leg cramps just laying in the bed."

## 2017-09-10 NOTE — ED Notes (Signed)
CRITICAL VALUE ALERT  Critical Value:  Troponin - 0.04  Date & Time Notied:  09/10/17   6754  Provider Notified: Dr Thurnell Garbe  Orders Received/Actions taken: none at this time.

## 2017-09-10 NOTE — ED Provider Notes (Addendum)
[facility] EMERGENCY DEPARTMENT Provider Note   CSN: [id number] Arrival date & time: 09/10/17  0630     History   Chief Complaint Chief Complaint  Patient presents with  . Leg Swelling    HPI Brian Parsons is a 67 y.o. male.  HPI Pt was seen at 0735. Per pt, c/o gradual onset and worsening of persistent pedal edema and SOB for the past 3 to 4 days. Has been associated with cough. Pt states he has had daily weight gain despite taking an extra dose of torsemide for the past 3 to 4 days. Denies CP/palpitations, no abd pain, no back pain, no N/V/D, no fevers.    Past Medical History:  Diagnosis Date  . Bilateral leg edema   . CHF (congestive heart failure) ([place])   . COPD (chronic obstructive pulmonary disease) ([place])   . Coronary atherosclerosis    Nonobstructive at cardiac catheterization April 2017  . Essential hypertension   . On home O2    2L N/C prn  . Polycythemia     Patient Active Problem List   Diagnosis Date Noted  . CHF, acute on chronic ([place]) 04/29/2016  . AKI (acute kidney injury) ([place]) 04/29/2016  . CHF exacerbation ([place]) 04/29/2016  . CHF (congestive heart failure) ([place]) 02/24/2016  . Exertional dyspnea   . Abnormal nuclear stress test 08/04/2015  . Chronic diastolic heart failure ([place]) 08/04/2015  . Essential hypertension 08/04/2015  . Polycythemia, secondary 08/04/2015  . COPD exacerbation ([place]) 08/04/2015  . Tobacco abuse 08/02/2015  . Acute on chronic renal insufficiency 07/28/2015  . Acute diastolic CHF (congestive heart failure) ([place]) 07/28/2015  . Acute on chronic respiratory failure ([place]) 08/04/2014  . COPD (chronic obstructive pulmonary disease) ([place]) 08/03/2014  . Hypoxia 08/03/2014  . Lower extremity edema 08/03/2014  . Elevated troponin 08/03/2014  . Dyspnea 08/03/2014    Past Surgical History:  Procedure Laterality Date  . CARDIAC CATHETERIZATION N/A 08/10/2015   Procedure: Left Heart Cath and Coronary Angiography;  Surgeon: Troy Sine, MD;  Location: [facility];  Service: Cardiovascular;  Laterality: N/A;        Home Medications    Prior to Admission medications   Medication Sig Start Date End Date Taking? Authorizing Provider  ADVAIR DISKUS 250-50 MCG/DOSE AEPB USE 1 INHALATION BY MOUTH EVERY 12 HOURS. <<RINSE MOUTH AFTER USE>>. 10/28/14   [place], Kelby Fam, MD  aspirin EC 81 MG tablet Take 81 mg by mouth daily.    [provider]  atorvastatin (LIPITOR) 20 MG tablet Take 20 mg by mouth daily at 6 PM.  03/01/17 03/01/18  [provider]  escitalopram (LEXAPRO) 10 MG tablet Take 10 mg by mouth at bedtime.  08/29/16   [provider]  ipratropium-albuterol (DUONEB) 0.5-2.5 (3) MG/3ML SOLN Take 3 mLs by nebulization every 6 (six) hours as needed. 07/31/16   Hosie Poisson, MD  lisinopril (PRINIVIL,ZESTRIL) 20 MG tablet Take 0.5 tablets (10 mg total) by mouth daily. 08/07/14   Kathie Dike, MD  MAGNESIUM PO Take 1 tablet by mouth daily.    [provider]  nicotine (NICODERM CQ) 21 mg/24hr patch Place 1 patch (21 mg total) onto the skin daily. 08/17/15   Lendon Colonel, NP  potassium chloride (MICRO-K) 10 MEQ CR capsule Take 1 capsule by mouth daily. 06/29/16   [provider]  predniSONE (DELTASONE) 20 MG tablet 2 tabs po daily x 4 days 03/29/17   Sherwood Gambler, MD  torsemide (DEMADEX) 20 MG  tablet Take 60 mg by mouth daily.    [provider]  vitamin B-12 (CYANOCOBALAMIN) 1000 MCG tablet Take 1,000 mcg by mouth daily.    [provider]    Family History Family History  Problem Relation Age of Onset  . Stroke Mother   . Emphysema Father     Social History Social History   Tobacco Use  . Smoking status: Current Every Day Smoker    Packs/day: 0.50    Years: 50.00    Pack years: 25.00    Types: Cigarettes    Start date: 04/17/1961  . Smokeless tobacco: Never Used  Substance Use Topics  . Alcohol use: No    Alcohol/week: 0.0 oz     Comment: No EtOH for 7 years  . Drug use: No     Allergies   Amoxicillin and Penicillins   Review of Systems Review of Systems ROS: Statement: All systems negative except as marked or noted in the HPI; Constitutional: Negative for fever and chills. ; ; Eyes: Negative for eye pain, redness and discharge. ; ; ENMT: Negative for ear pain, hoarseness, nasal congestion, sinus pressure and sore throat. ; ; Cardiovascular: Negative for chest pain, palpitations, diaphoresis, +dyspnea and peripheral edema. ; ; Respiratory: +cough. Negative for wheezing and stridor. ; ; Gastrointestinal: Negative for nausea, vomiting, diarrhea, abdominal pain, blood in stool, hematemesis, jaundice and rectal bleeding. . ; ; Genitourinary: Negative for dysuria, flank pain and hematuria. ; ; Musculoskeletal: Negative for back pain and neck pain. Negative for swelling and trauma.; ; Skin: Negative for pruritus, rash, abrasions, blisters, bruising and skin lesion.; ; Neuro: Negative for headache, lightheadedness and neck stiffness. Negative for weakness, altered level of consciousness, altered mental status, extremity weakness, paresthesias, involuntary movement, seizure and syncope.       Physical Exam Updated Vital Signs BP 99/68   Pulse 95   Temp 97.6 F (36.4 C) (Oral)   Resp 18   Ht 5\' 7"  (1.702 m)   Wt 108 kg (238 lb)   SpO2 94%   BMI 37.28 kg/m    BP 106/63   Pulse (!) 110   Temp 97.6 F (36.4 C) (Oral)   Resp 18   Ht 5\' 7"  (1.702 m)   Wt 108 kg (238 lb)   SpO2 95%   BMI 37.28 kg/m    Physical Exam 0740: Physical examination:  Nursing notes reviewed; Vital signs and O2 SAT reviewed;  Constitutional: Well developed, Well nourished, Well hydrated, In no acute distress; Head:  Normocephalic, atraumatic; Eyes: EOMI, PERRL, No scleral icterus; ENMT: Mouth and pharynx normal, Mucous membranes moist; Neck: Supple, Full range of motion, No lymphadenopathy; Cardiovascular: Regular rate and rhythm, No  gallop; Respiratory: Breath sounds diminished & equal bilaterally, faint wheezes. +moist cough during exam. Speaking full sentences with ease, Normal respiratory effort/excursion; Chest: Nontender, Movement normal; Abdomen: Soft, Nontender, Nondistended, Normal bowel sounds; Genitourinary: No CVA tenderness; Extremities: Peripheral pulses normal, No tenderness, +2 pedal edema bilat. No calf asymmetry.; Neuro: AA&Ox3, Major CN grossly intact.  Speech clear. No gross focal motor or sensory deficits in extremities.; Skin: Color normal, Warm, Dry.   ED Treatments / Results  Labs (all labs ordered are listed, but only abnormal results are displayed)   EKG EKG Interpretation  Date/Time:  Monday Sep 10 2017 07:13:04 EDT Ventricular Rate:  110 PR Interval:    QRS Duration: 121 QT Interval:  374 QTC Calculation: 483 R Axis:   -60 Text Interpretation:  Sinus tachycardia Multiple  premature complexes, vent & supraven Right bundle branch block Inferior infarct, age indeterminate Lateral leads are also involved Baseline wander When compared with ECG of 03/28/2017 Rate faster Otherwise no significant change Confirmed by Francine Graven [id number]) on 09/10/2017 7:51:55 AM   Radiology   Procedures Procedures (including critical care time)  Medications Ordered in ED Medications  ipratropium-albuterol (DUONEB) 0.5-2.5 (3) MG/3ML nebulizer solution 3 mL (has no administration in time range)  albuterol (PROVENTIL) (2.5 MG/3ML) 0.083% nebulizer solution 2.5 mg (has no administration in time range)     Initial Impression / Assessment and Plan / ED Course  I have reviewed the triage vital signs and the nursing notes.  Pertinent labs & imaging results that were available during my care of the patient were reviewed by me and considered in my medical decision making (see chart for details).  MDM Reviewed: previous chart, nursing note and vitals Reviewed previous: labs and ECG Interpretation: labs, ECG  and x-ray Total time providing critical care: 30-74 minutes. This excludes time spent performing separately reportable procedures and services.   CRITICAL CARE Performed by: Alfonzo Feller Total critical care time: 35 minutes Critical care time was exclusive of separately billable procedures and treating other patients. Critical care was necessary to treat or prevent imminent or life-threatening deterioration. Critical care was time spent personally by me on the following activities: development of treatment plan with patient and/or surrogate as well as nursing, discussions with consultants, evaluation of patient's response to treatment, examination of patient, obtaining history from patient or surrogate, ordering and performing treatments and interventions, ordering and review of laboratory studies, ordering and review of radiographic studies, pulse oximetry and re-evaluation of patient's condition.   Results for orders placed or performed during the hospital encounter of 09/10/17  CBC with Differential  Result Value Ref Range   WBC 8.2 4.0 - 10.5 K/uL   RBC 7.00 (H) 4.22 - 5.81 MIL/uL   Hemoglobin 14.6 13.0 - 17.0 g/dL   HCT 54.1 (H) 39.0 - 52.0 %   MCV 77.3 (L) 78.0 - 100.0 fL   MCH 20.9 (L) 26.0 - 34.0 pg   MCHC 27.0 (L) 30.0 - 36.0 g/dL   RDW 22.3 (H) 11.5 - 15.5 %   Platelets 217 150 - 400 K/uL   Neutrophils Relative % 68 %   Neutro Abs 5.6 1.7 - 7.7 K/uL   Lymphocytes Relative 17 %   Lymphs Abs 1.4 0.7 - 4.0 K/uL   Monocytes Relative 10 %   Monocytes Absolute 0.8 0.1 - 1.0 K/uL   Eosinophils Relative 5 %   Eosinophils Absolute 0.4 0.0 - 0.7 K/uL   Basophils Relative 0 %   Basophils Absolute 0.0 0.0 - 0.1 K/uL  Basic metabolic panel  Result Value Ref Range   Sodium 135 135 - 145 mmol/L   Potassium 4.0 3.5 - 5.1 mmol/L   Chloride 88 (L) 101 - 111 mmol/L   CO2 41 (H) 22 - 32 mmol/L   Glucose, Bld 109 (H) 65 - 99 mg/dL   BUN 24 (H) 6 - 20 mg/dL   Creatinine, Ser 1.20  0.61 - 1.24 mg/dL   Calcium 8.7 (L) 8.9 - 10.3 mg/dL   GFR calc non Af Amer 60 (L) >60 mL/min   GFR calc Af Amer >60 >60 mL/min   Anion gap 6 5 - 15  Brain natriuretic peptide  Result Value Ref Range   B Natriuretic Peptide 39.0 0.0 - 100.0 pg/mL  Troponin I  Result Value Ref  Range   Troponin I 0.04 (HH) <0.03 ng/mL  Troponin I  Result Value Ref Range   Troponin I 0.03 (HH) <0.03 ng/mL   Dg Chest 2 View Result Date: 09/10/2017 CLINICAL DATA:  Shortness of breath lower extremity edema EXAM: CHEST - 2 VIEW COMPARISON:  March 28, 2017 FINDINGS: There is no edema or consolidation. Heart is upper normal in size with pulmonary vascularity within normal limits. No adenopathy. No evident bone lesions. IMPRESSION: No edema or consolidation.  Heart upper normal in size. Electronically Signed   By: Lowella Grip III M.D.   On: 09/10/2017 08:10     0830:  Short neb given initially for decreased lung sounds on exam. Lungs are now with insp/exp wheezing bilat, +moist cough, Sats remain 91% on O2 2L N/C. Will dose IV solumedrol and start hour long neb.   1030:  Delta trop per pt's baseline, as well as EKG. Pt denies CP now or at any time recently. On arrival: pt sitting upright, Sats 85 % R/A, lungs diminished with moist cough. IV solumedrol and hour long neb started. After neb: pt appears more comfortable at rest, less tachypneic, Sats 96-97 % on O2 2L N/C, lungs continue diminished with scattered wheezing. Pt ambulated in hallway: pt's O2 Sats dropped to 90 % R/A. Pt escorted back to stretcher with O2 Sats slowly increasing to 94% on O2 2L N/C. Pt denied any complaints while ambulating. Pt states he "feels fine" and wants to go home. Offered admission several times, pt continues to refuse. Family supports pt's decision. ED RN and I encouraged pt to stay, continues to refuse.  Pt makes his own medical decisions.  Risks of AMA explained to pt and family, including, but not limited to:  stroke, heart  attack, cardiac arrythmia ("irregular heart rate/beat"), "passing out," temporary and/or permanent disability, death.  Pt and family verb understanding and continue to refuse admission, understanding the consequences of their decision.  I encouraged pt to follow up with his PMD tomorrow and return to the ED immediately if symptoms return, or for any other concerns.  Pt and family verb understanding, agreeable.      Final Clinical Impressions(s) / ED Diagnoses   Final diagnoses:  None    ED Discharge Orders    None       Francine Graven, DO 09/15/17 [place], [place], DO 09/24/17 1058

## 2017-10-01 ENCOUNTER — Inpatient Hospital Stay (HOSPITAL_COMMUNITY)
Admission: EM | Admit: 2017-10-01 | Discharge: 2017-10-03 | DRG: 190 | Disposition: A | Discharge status: Home / Self Care | Payer: Medicare Other | Attending: Family Medicine | Admitting: Family Medicine

## 2017-10-01 ENCOUNTER — Emergency Department (HOSPITAL_COMMUNITY): Payer: Medicare Other

## 2017-10-01 ENCOUNTER — Encounter (HOSPITAL_COMMUNITY): Payer: Self-pay | Admitting: Emergency Medicine

## 2017-10-01 DIAGNOSIS — Z888 Allergy status to other drugs, medicaments and biological substances status: Secondary | ICD-10-CM

## 2017-10-01 DIAGNOSIS — J90 Pleural effusion, not elsewhere classified: Secondary | ICD-10-CM | POA: Diagnosis present

## 2017-10-01 DIAGNOSIS — I5031 Acute diastolic (congestive) heart failure: Secondary | ICD-10-CM

## 2017-10-01 DIAGNOSIS — I5032 Chronic diastolic (congestive) heart failure: Secondary | ICD-10-CM | POA: Diagnosis not present

## 2017-10-01 DIAGNOSIS — I2510 Atherosclerotic heart disease of native coronary artery without angina pectoris: Secondary | ICD-10-CM | POA: Diagnosis present

## 2017-10-01 DIAGNOSIS — M7989 Other specified soft tissue disorders: Secondary | ICD-10-CM | POA: Diagnosis present

## 2017-10-01 DIAGNOSIS — Z7982 Long term (current) use of aspirin: Secondary | ICD-10-CM | POA: Diagnosis not present

## 2017-10-01 DIAGNOSIS — Z9981 Dependence on supplemental oxygen: Secondary | ICD-10-CM | POA: Diagnosis not present

## 2017-10-01 DIAGNOSIS — M109 Gout, unspecified: Secondary | ICD-10-CM | POA: Diagnosis present

## 2017-10-01 DIAGNOSIS — F17210 Nicotine dependence, cigarettes, uncomplicated: Secondary | ICD-10-CM | POA: Diagnosis present

## 2017-10-01 DIAGNOSIS — Z880 Allergy status to penicillin: Secondary | ICD-10-CM | POA: Diagnosis not present

## 2017-10-01 DIAGNOSIS — Z720 Tobacco use: Secondary | ICD-10-CM

## 2017-10-01 DIAGNOSIS — I509 Heart failure, unspecified: Secondary | ICD-10-CM

## 2017-10-01 DIAGNOSIS — Z79899 Other long term (current) drug therapy: Secondary | ICD-10-CM | POA: Diagnosis not present

## 2017-10-01 DIAGNOSIS — J810 Acute pulmonary edema: Secondary | ICD-10-CM

## 2017-10-01 DIAGNOSIS — J441 Chronic obstructive pulmonary disease with (acute) exacerbation: Secondary | ICD-10-CM | POA: Diagnosis present

## 2017-10-01 DIAGNOSIS — I110 Hypertensive heart disease with heart failure: Secondary | ICD-10-CM | POA: Diagnosis present

## 2017-10-01 DIAGNOSIS — I5033 Acute on chronic diastolic (congestive) heart failure: Secondary | ICD-10-CM | POA: Diagnosis present

## 2017-10-01 DIAGNOSIS — D751 Secondary polycythemia: Secondary | ICD-10-CM | POA: Diagnosis present

## 2017-10-01 DIAGNOSIS — R609 Edema, unspecified: Secondary | ICD-10-CM | POA: Diagnosis present

## 2017-10-01 LAB — CBC WITH DIFFERENTIAL/PLATELET
BASOS ABS: 0 10*3/uL (ref 0.0–0.1)
BASOS PCT: 0 %
EOS ABS: 0 10*3/uL (ref 0.0–0.7)
Eosinophils Relative: 0 %
HCT: 51.1 % (ref 39.0–52.0)
Hemoglobin: 13.8 g/dL (ref 13.0–17.0)
LYMPHS ABS: 0.7 10*3/uL (ref 0.7–4.0)
Lymphocytes Relative: 10 %
MCH: 20.5 pg — AB (ref 26.0–34.0)
MCHC: 27 g/dL — AB (ref 30.0–36.0)
MCV: 75.9 fL — ABNORMAL LOW (ref 78.0–100.0)
MONO ABS: 0.3 10*3/uL (ref 0.1–1.0)
Monocytes Relative: 4 %
NEUTROS ABS: 6.4 10*3/uL (ref 1.7–7.7)
Neutrophils Relative %: 86 %
PLATELETS: 205 10*3/uL (ref 150–400)
RBC: 6.73 MIL/uL — ABNORMAL HIGH (ref 4.22–5.81)
RDW: 24.2 % — AB (ref 11.5–15.5)
WBC: 7.4 10*3/uL (ref 4.0–10.5)

## 2017-10-01 LAB — COMPREHENSIVE METABOLIC PANEL
ALK PHOS: 91 U/L (ref 38–126)
ALT: 16 U/L — ABNORMAL LOW (ref 17–63)
ANION GAP: 9 (ref 5–15)
AST: 16 U/L (ref 15–41)
Albumin: 3.8 g/dL (ref 3.5–5.0)
BUN: 30 mg/dL — ABNORMAL HIGH (ref 6–20)
CHLORIDE: 91 mmol/L — AB (ref 101–111)
CO2: 38 mmol/L — AB (ref 22–32)
Calcium: 8.8 mg/dL — ABNORMAL LOW (ref 8.9–10.3)
Creatinine, Ser: 1.32 mg/dL — ABNORMAL HIGH (ref 0.61–1.24)
GFR calc non Af Amer: 53 mL/min — ABNORMAL LOW (ref >60)
Glucose, Bld: 137 mg/dL — ABNORMAL HIGH (ref 65–99)
POTASSIUM: 4.2 mmol/L (ref 3.5–5.1)
SODIUM: 138 mmol/L (ref 135–145)
Total Bilirubin: 0.6 mg/dL (ref 0.3–1.2)
Total Protein: 6.9 g/dL (ref 6.5–8.1)

## 2017-10-01 LAB — I-STAT TROPONIN, ED: TROPONIN I, POC: 0.02 ng/mL (ref 0.00–0.08)

## 2017-10-01 LAB — BRAIN NATRIURETIC PEPTIDE: B Natriuretic Peptide: 54 pg/mL (ref 0.0–100.0)

## 2017-10-01 MED ORDER — SODIUM CHLORIDE 0.9 % IV SOLN
500.0000 mg | INTRAVENOUS | Status: DC
  Administered 2017-10-01: 500 mg via INTRAVENOUS
  Filled 2017-10-01: qty 500

## 2017-10-01 MED ORDER — FUROSEMIDE 10 MG/ML IJ SOLN
40.0000 mg | Freq: Once | INTRAMUSCULAR | Status: AC
  Administered 2017-10-01: 40 mg via INTRAVENOUS
  Filled 2017-10-01: qty 4

## 2017-10-01 MED ORDER — SODIUM CHLORIDE 0.9% FLUSH
3.0000 mL | Freq: Two times a day (BID) | INTRAVENOUS | Status: DC
  Administered 2017-10-01 – 2017-10-03 (×5): 3 mL via INTRAVENOUS

## 2017-10-01 MED ORDER — ALBUTEROL SULFATE (2.5 MG/3ML) 0.083% IN NEBU
2.5000 mg | INHALATION_SOLUTION | Freq: Four times a day (QID) | RESPIRATORY_TRACT | Status: DC
  Administered 2017-10-01 – 2017-10-02 (×2): 2.5 mg via RESPIRATORY_TRACT
  Filled 2017-10-01 (×2): qty 3

## 2017-10-01 MED ORDER — NICOTINE 21 MG/24HR TD PT24
21.0000 mg | MEDICATED_PATCH | Freq: Every day | TRANSDERMAL | Status: DC
  Administered 2017-10-02 – 2017-10-03 (×2): 21 mg via TRANSDERMAL
  Filled 2017-10-01 (×2): qty 1

## 2017-10-01 MED ORDER — ENOXAPARIN SODIUM 40 MG/0.4ML ~~LOC~~ SOLN
40.0000 mg | SUBCUTANEOUS | Status: DC
  Administered 2017-10-01 – 2017-10-02 (×2): 40 mg via SUBCUTANEOUS
  Filled 2017-10-01 (×2): qty 0.4

## 2017-10-01 MED ORDER — FUROSEMIDE 10 MG/ML IJ SOLN
60.0000 mg | Freq: Once | INTRAMUSCULAR | Status: AC
Start: 2017-10-01 — End: 2017-10-01
  Administered 2017-10-01: 60 mg via INTRAVENOUS
  Filled 2017-10-01: qty 6

## 2017-10-01 MED ORDER — ALBUTEROL (5 MG/ML) CONTINUOUS INHALATION SOLN
10.0000 mg/h | INHALATION_SOLUTION | Freq: Once | RESPIRATORY_TRACT | Status: AC
  Administered 2017-10-01: 10 mg/h via RESPIRATORY_TRACT
  Filled 2017-10-01: qty 20

## 2017-10-01 MED ORDER — SODIUM CHLORIDE 0.9% FLUSH
3.0000 mL | INTRAVENOUS | Status: DC | PRN
  Administered 2017-10-02: 3 mL via INTRAVENOUS
  Filled 2017-10-01: qty 3

## 2017-10-01 MED ORDER — ALLOPURINOL 100 MG PO TABS
200.0000 mg | ORAL_TABLET | Freq: Two times a day (BID) | ORAL | Status: DC
  Administered 2017-10-01 – 2017-10-03 (×4): 200 mg via ORAL
  Filled 2017-10-01 (×4): qty 2

## 2017-10-01 MED ORDER — MOMETASONE FURO-FORMOTEROL FUM 200-5 MCG/ACT IN AERO
2.0000 | INHALATION_SPRAY | Freq: Two times a day (BID) | RESPIRATORY_TRACT | Status: DC
  Administered 2017-10-01 – 2017-10-03 (×4): 2 via RESPIRATORY_TRACT
  Filled 2017-10-01: qty 8.8

## 2017-10-01 MED ORDER — CARVEDILOL 3.125 MG PO TABS
3.1250 mg | ORAL_TABLET | Freq: Two times a day (BID) | ORAL | Status: DC
  Administered 2017-10-01 – 2017-10-03 (×4): 3.125 mg via ORAL
  Filled 2017-10-01 (×4): qty 1

## 2017-10-01 MED ORDER — ALBUTEROL (5 MG/ML) CONTINUOUS INHALATION SOLN
10.0000 mg/h | INHALATION_SOLUTION | Freq: Once | RESPIRATORY_TRACT | Status: AC
  Administered 2017-10-01: 10 mg/h via RESPIRATORY_TRACT

## 2017-10-01 MED ORDER — FUROSEMIDE 10 MG/ML IJ SOLN
INTRAMUSCULAR | Status: AC
  Filled 2017-10-01: qty 2

## 2017-10-01 MED ORDER — METHYLPREDNISOLONE SODIUM SUCC 125 MG IJ SOLR
80.0000 mg | Freq: Three times a day (TID) | INTRAMUSCULAR | Status: DC
  Administered 2017-10-01 – 2017-10-02 (×2): 80 mg via INTRAVENOUS
  Filled 2017-10-01 (×2): qty 2

## 2017-10-01 MED ORDER — TORSEMIDE 20 MG PO TABS
80.0000 mg | ORAL_TABLET | Freq: Two times a day (BID) | ORAL | Status: DC
  Administered 2017-10-01 – 2017-10-03 (×4): 80 mg via ORAL
  Filled 2017-10-01 (×4): qty 4

## 2017-10-01 MED ORDER — ASPIRIN EC 81 MG PO TBEC
81.0000 mg | DELAYED_RELEASE_TABLET | Freq: Every day | ORAL | Status: DC
  Administered 2017-10-02 – 2017-10-03 (×2): 81 mg via ORAL
  Filled 2017-10-01 (×3): qty 1

## 2017-10-01 MED ORDER — ACETAMINOPHEN 650 MG RE SUPP: 650.0000 mg | Freq: Four times a day (QID) | RECTAL | Status: DC | PRN

## 2017-10-01 MED ORDER — ATORVASTATIN CALCIUM 20 MG PO TABS
20.0000 mg | ORAL_TABLET | Freq: Every day | ORAL | Status: DC
  Administered 2017-10-01 – 2017-10-02 (×2): 20 mg via ORAL
  Filled 2017-10-01 (×2): qty 1

## 2017-10-01 MED ORDER — METHYLPREDNISOLONE SODIUM SUCC 125 MG IJ SOLR
125.0000 mg | Freq: Once | INTRAMUSCULAR | Status: AC
  Administered 2017-10-01: 125 mg via INTRAVENOUS
  Filled 2017-10-01: qty 2

## 2017-10-01 MED ORDER — ALBUTEROL SULFATE (2.5 MG/3ML) 0.083% IN NEBU: 2.5000 mg | INHALATION_SOLUTION | RESPIRATORY_TRACT | Status: DC | PRN

## 2017-10-01 MED ORDER — ACETAMINOPHEN 325 MG PO TABS: 650.0000 mg | ORAL_TABLET | Freq: Four times a day (QID) | ORAL | Status: DC | PRN

## 2017-10-01 MED ORDER — POTASSIUM CHLORIDE CRYS ER 20 MEQ PO TBCR: 20.0000 meq | EXTENDED_RELEASE_TABLET | Freq: Every day | ORAL | Status: DC

## 2017-10-01 MED ORDER — SODIUM CHLORIDE 0.9 % IV SOLN: 250.0000 mL | INTRAVENOUS | Status: DC | PRN

## 2017-10-01 MED ORDER — VITAMIN B-12 1000 MCG PO TABS
1000.0000 ug | ORAL_TABLET | Freq: Every day | ORAL | Status: DC
  Administered 2017-10-02 – 2017-10-03 (×2): 1000 ug via ORAL
  Filled 2017-10-01 (×2): qty 1

## 2017-10-01 MED ORDER — ALBUTEROL SULFATE (2.5 MG/3ML) 0.083% IN NEBU: 2.5000 mg | INHALATION_SOLUTION | Freq: Four times a day (QID) | RESPIRATORY_TRACT | Status: DC | PRN

## 2017-10-01 MED ORDER — ENOXAPARIN SODIUM 30 MG/0.3ML ~~LOC~~ SOLN: 30.0000 mg | SUBCUTANEOUS | Status: DC

## 2017-10-01 MED ORDER — LISINOPRIL 5 MG PO TABS: 2.5000 mg | ORAL_TABLET | Freq: Every day | ORAL | Status: DC

## 2017-10-01 NOTE — ED Triage Notes (Signed)
Pt reports he was here 3 weeks ago for increased edema.  States they wanted to keep him for low oxygen levels, but he doesn't believe they are really low and he chose to go home.  Was told by his cardiologist to come be evaluated.

## 2017-10-01 NOTE — ED Provider Notes (Addendum)
[facility] EMERGENCY DEPARTMENT Provider Note   CSN: [id number] Arrival date & time: 10/01/17  1136     History   Chief Complaint Chief Complaint  Patient presents with  . Leg Swelling    HPI Brian Parsons is a 67 y.o. male.  Pt presents to the ED today with bilateral leg swelling and sob.  The pt has a hx of COPD requiring 2L [place] O2, but he has not been using his oxygen.  Pt was here on 5/27 for the same.  Admission was recommended, but he declined.  He did f/u with his cardiologist on 6/6 who has ordered an ECHO for the 19th.  Pt does continue to smoke, but has decreased his smoking to about 1/2 pack per day.  Pt denies f/c.     Past Medical History:  Diagnosis Date  . Bilateral leg edema   . CHF (congestive heart failure) ([place])   . COPD (chronic obstructive pulmonary disease) ([place])   . Coronary atherosclerosis    Nonobstructive at cardiac catheterization April 2017  . Essential hypertension   . On home O2    2L N/[place] prn  . Polycythemia     Patient Active Problem List   Diagnosis Date Noted  . CHF, acute on chronic ([place]) 04/29/2016  . AKI (acute kidney injury) ([place]) 04/29/2016  . CHF exacerbation ([place]) 04/29/2016  . CHF (congestive heart failure) ([place]) 02/24/2016  . Exertional dyspnea   . Abnormal nuclear stress test 08/04/2015  . Chronic diastolic heart failure ([place]) 08/04/2015  . Essential hypertension 08/04/2015  . Polycythemia, secondary 08/04/2015  . COPD exacerbation ([place]) 08/04/2015  . Tobacco abuse 08/02/2015  . Acute on chronic renal insufficiency 07/28/2015  . Acute diastolic CHF (congestive heart failure) ([place]) 07/28/2015  . Acute on chronic respiratory failure ([place]) 08/04/2014  . COPD (chronic obstructive pulmonary disease) ([place]) 08/03/2014  . Hypoxia 08/03/2014  . Lower extremity edema 08/03/2014  . Elevated troponin 08/03/2014  . Dyspnea 08/03/2014    Past Surgical History:  Procedure Laterality Date  . CARDIAC CATHETERIZATION N/A 08/10/2015    Procedure: Left Heart Cath and Coronary Angiography;  Surgeon: Troy Sine, MD;  Location: [facility];  Service: Cardiovascular;  Laterality: N/A;        Home Medications    Prior to Admission medications   Medication Sig Start Date End Date Taking? Authorizing Provider  ADVAIR DISKUS 250-50 MCG/DOSE AEPB USE 1 INHALATION BY MOUTH EVERY 12 HOURS. <<RINSE MOUTH AFTER USE>>. 10/28/14  Yes Penland, Kelby Fam, MD  albuterol (PROVENTIL HFA;VENTOLIN HFA) 108 (90 Base) MCG/ACT inhaler Inhale 2 puffs into the lungs every 4 (four) hours as needed for wheezing or shortness of breath. 09/10/17  Yes Francine Graven, DO  albuterol (PROVENTIL) (2.5 MG/3ML) 0.083% nebulizer solution Take 3 mLs (2.5 mg total) by nebulization every 4 (four) hours as needed for wheezing or shortness of breath. 09/10/17  Yes Francine Graven, DO  allopurinol (ZYLOPRIM) 100 MG tablet Take 200 mg by mouth 2 (two) times daily.  08/06/17  Yes [provider]  aspirin EC 81 MG tablet Take 81 mg by mouth daily.   Yes [provider]  atorvastatin (LIPITOR) 20 MG tablet Take 20 mg by mouth daily at 6 PM.  03/01/17 03/01/18 Yes [provider]  doxycycline (VIBRA-TABS) 100 MG tablet Take 1 tablet (100 mg total) by mouth 2 (two) times daily. 09/10/17  Yes Francine Graven, DO  lisinopril (PRINIVIL,ZESTRIL) 2.5 MG tablet Take 2.5 mg by mouth daily.  Yes [provider]  MAGNESIUM PO Take 1 tablet by mouth daily.   Yes [provider]  nicotine (NICODERM CQ) 21 mg/24hr patch Place 1 patch (21 mg total) onto the skin daily. 08/17/15  Yes Lendon Colonel, NP  potassium chloride (MICRO-K) 10 MEQ CR capsule Take 1 capsule by mouth daily. 06/29/16  Yes [provider]  torsemide (DEMADEX) 20 MG tablet Take 80 mg by mouth 2 (two) times daily.    Yes [provider]  vitamin B-12 (CYANOCOBALAMIN) 1000 MCG tablet Take 1,000 mcg by mouth daily.   Yes [provider]  predniSONE (DELTASONE) 20 MG tablet Take 2 tablets (40 mg total) by mouth daily. Patient not taking: Reported on 10/01/2017 09/10/17   Francine Graven, DO    Family History Family History  Problem Relation Age of Onset  . Stroke Mother   . Emphysema Father     Social History Social History   Tobacco Use  . Smoking status: Current Every Day Smoker    Packs/day: 0.50    Years: 50.00    Pack years: 25.00    Types: Cigarettes    Start date: 04/17/1961  . Smokeless tobacco: Never Used  Substance Use Topics  . Alcohol use: No    Alcohol/week: 0.0 oz    Comment: No EtOH for 7 years  . Drug use: No     Allergies   Amoxicillin and Penicillins   Review of Systems Review of Systems  Respiratory: Positive for cough, shortness of breath and wheezing.   Cardiovascular: Positive for leg swelling.  All other systems reviewed and are negative.    Physical Exam Updated Vital Signs BP 124/74   Pulse (!) 108   Temp 98.5 F (36.9 C) (Oral)   Resp (!) 24   Ht 5\' 7"  (1.702 m)   Wt 108 kg (238 lb)   SpO2 96%   BMI 37.28 kg/m   Physical Exam  Constitutional: He is oriented to person, place, and time. He appears well-developed and well-nourished.  HENT:  Head: Normocephalic and atraumatic.  Right Ear: External ear normal.  Left Ear: External ear normal.  Nose: Nose normal.  Mouth/Throat: Oropharynx is clear and moist.  Eyes: Pupils are equal, round, and reactive to light. Conjunctivae and EOM are normal.  Neck: Normal range of motion. Neck supple.  Cardiovascular: Normal rate, regular rhythm, normal heart sounds and intact distal pulses.  Pulmonary/Chest: He has wheezes.  Abdominal: Soft. Bowel sounds are normal.  Musculoskeletal: He exhibits edema.  Neurological: He is alert and oriented to person, place, and time.  Skin: Skin is warm. Capillary refill takes less than 2 seconds.  Psychiatric: He has a normal mood and affect. His behavior is normal. Judgment and  thought content normal.  Nursing note and vitals reviewed.    ED Treatments / Results  Labs (all labs ordered are listed, but only abnormal results are displayed) Labs Reviewed  CBC WITH DIFFERENTIAL/PLATELET - Abnormal; Notable for the following components:      Result Value   RBC 6.73 (*)    MCV 75.9 (*)    MCH 20.5 (*)    MCHC 27.0 (*)    RDW 24.2 (*)    All other components within normal limits  COMPREHENSIVE METABOLIC PANEL - Abnormal; Notable for the following components:   Chloride 91 (*)    CO2 38 (*)    Glucose, Bld 137 (*)    BUN 30 (*)    Creatinine, Ser 1.32 (*)  Calcium 8.8 (*)    ALT 16 (*)    GFR calc non Af Amer 53 (*)    All other components within normal limits  BRAIN NATRIURETIC PEPTIDE  I-STAT TROPONIN, ED    EKG EKG Interpretation  Date/Time:  Monday October 01 2017 12:36:28 EDT Ventricular Rate:  94 PR Interval:    QRS Duration: 130 QT Interval:  376 QTC Calculation: 471 R Axis:   18 Text Interpretation:  Sinus rhythm Atrial premature complexes Right bundle branch block Nonspecific T abnormalities, lateral leads No significant change since last tracing Confirmed by Isla Pence [phone]) on 10/01/2017 12:44:36 PM   Radiology Dg Chest 2 View  Result Date: 10/01/2017 CLINICAL DATA:  Shortness of breath and lower extremity edema. EXAM: CHEST - 2 VIEW COMPARISON:  Chest x-ray dated Sep 10, 2017. FINDINGS: Stable mild cardiomegaly. Mild pulmonary vascular congestion and lower lobe predominant interstitial thickening, similar to prior study. New trace right pleural effusion with adjacent subsegmental atelectasis. No consolidation or pneumothorax. No acute osseous abnormality. IMPRESSION: 1. Pulmonary vascular congestion and mild interstitial edema with new trace right pleural effusion. Electronically Signed   By: Titus Dubin M.D.   On: 10/01/2017 12:10    Procedures Procedures (including critical care time)  Medications Ordered in  ED Medications  albuterol (PROVENTIL,VENTOLIN) solution continuous neb (has no administration in time range)  furosemide (LASIX) injection 60 mg (60 mg Intravenous Given 10/01/17 1329)  albuterol (PROVENTIL,VENTOLIN) solution continuous neb (10 mg/hr Nebulization Given 10/01/17 1255)  methylPREDNISolone sodium succinate (SOLU-MEDROL) 125 mg/2 mL injection 125 mg (125 mg Intravenous Given 10/01/17 1329)     Initial Impression / Assessment and Plan / ED Course  I have reviewed the triage vital signs and the nursing notes.  Pertinent labs & imaging results that were available during my care of the patient were reviewed by me and considered in my medical decision making (see chart for details). CRITICAL CARE Performed by: Isla Pence   Total critical care time: 30 minutes  Critical care time was exclusive of separately billable procedures and treating other patients.  Critical care was necessary to treat or prevent imminent or life-threatening deterioration.  Critical care was time spent personally by me on the following activities: development of treatment plan with patient and/or surrogate as well as nursing, discussions with consultants, evaluation of patient's response to treatment, examination of patient, obtaining history from patient or surrogate, ordering and performing treatments and interventions, ordering and review of laboratory studies, ordering and review of radiographic studies, pulse oximetry and re-evaluation of patient's condition.    Pt is feeling a little better after lasix and continuous neb.  We ambulated him on 2L O2 and his sats dropped to 80-84% and he became very sob.  He was given another continuous neb.  He is willing to stay this time.  Pt d/w Dr. Maudie Mercury ([facility]) for admission.  Final Clinical Impressions(s) / ED Diagnoses   Final diagnoses:  Peripheral edema  Acute pulmonary edema ([place])  COPD with acute exacerbation [facility])  Tobacco abuse    ED Discharge  Orders    None       Isla Pence, MD 10/01/17 1544    Isla Pence, MD 10/17/17 [phone]

## 2017-10-01 NOTE — ED Notes (Signed)
Pt ambulated around the nursing station with 3L of O2 via [place] and stand by nursing assist. Pt's O2 sat ranged from 88-91% during first half of walk. About halfway around the nursing station, pt's O2 sat dropped and ranged from 80-84%. Pt reported only slight increase in SOB upon end of walk, but appeared very dyspneic and had to sit on the side of the stretcher to catch his breath prior to lying back down.

## 2017-10-01 NOTE — H&P (Signed)
TRH H&P   Patient Demographics:    Brian Parsons, is a 67 y.o. male  MRN: [id number]   DOB - 12-Apr-1951  Admit Date - 10/01/2017  Outpatient Primary MD for the patient is [name], MD  Referring MD/NP/PA: Marye Round  Outpatient Specialists: Isla Pence  Patient coming from: home  Chief Complaint  Patient presents with  . Leg Swelling      HPI:    Brian Parsons  is a 67 y.o. male, w Hypertension, CHF (EF 55-60%),  CAD,  Copd on home o2, apparently presents with c/o dyspnea for the past 2 weeks, worse today as well as bilateral leg swelling.   Pt denies fever, chills, cough, cp, palp, n/v, diarrhea, brbpr, black stool.   In ED,  CXR IMPRESSION: 1. Pulmonary vascular congestion and mild interstitial edema with new trace right pleural effusion.   EKG nsr at 95, LAD, RBBB, t inversion in v1-5  ,  Wbc 7.4, Hgb 13.8, Plt 205 Na 138, K 4.2, Bun 30, Creatinine 1.32 Ast 16, Alt 16  BNP 54 Trop 0.02  Pt will be admitted for dyspnea secondary to Copd exacerbation and tx of lower ext edema.      Review of systems:    In addition to the HPI above,  No Fever-chills, No Headache, No changes with Vision or hearing, No problems swallowing food or Liquids, No Chest pain, No Abdominal pain, No Nausea or Vommitting, Bowel movements are regular, No Blood in stool or Urine, No dysuria, No new skin rashes or bruises, No new joints pains-aches,  No new weakness, tingling, numbness in any extremity, No recent weight gain or loss, No polyuria, polydypsia or polyphagia, No significant Mental Stressors.  A full 10 point Review of Systems was done, except as stated above, all other Review of Systems were negative.   With Past History of the following :    Past Medical History:  Diagnosis Date  . Bilateral leg edema   . CHF (congestive heart failure) ([place])   .  COPD (chronic obstructive pulmonary disease) ([place])   . Coronary atherosclerosis    Nonobstructive at cardiac catheterization April 2017  . Essential hypertension   . On home O2    2L N/C prn  . Polycythemia       Past Surgical History:  Procedure Laterality Date  . CARDIAC CATHETERIZATION N/A 08/10/2015   Procedure: Left Heart Cath and Coronary Angiography;  Surgeon: Troy Sine, MD;  Location: [facility];  Service: Cardiovascular;  Laterality: N/A;      Social History:     Social History   Tobacco Use  . Smoking status: Current Every Day Smoker    Packs/day: 0.50    Years: 50.00    Pack years: 25.00    Types: Cigarettes    Start date: 04/17/1961  . Smokeless tobacco: Never Used  Substance Use  Topics  . Alcohol use: No    Alcohol/week: 0.0 oz    Comment: No EtOH for 7 years     Lives - at home  Mobility - walks by self   Family History :     Family History  Problem Relation Age of Onset  . Stroke Mother   . Emphysema Father        Home Medications:   Prior to Admission medications   Medication Sig Start Date End Date Taking? Authorizing Provider  ADVAIR DISKUS 250-50 MCG/DOSE AEPB USE 1 INHALATION BY MOUTH EVERY 12 HOURS. <<RINSE MOUTH AFTER USE>>. 10/28/14  Yes Penland, Kelby Fam, MD  albuterol (PROVENTIL HFA;VENTOLIN HFA) 108 (90 Base) MCG/ACT inhaler Inhale 2 puffs into the lungs every 4 (four) hours as needed for wheezing or shortness of breath. 09/10/17  Yes Francine Graven, DO  albuterol (PROVENTIL) (2.5 MG/3ML) 0.083% nebulizer solution Take 3 mLs (2.5 mg total) by nebulization every 4 (four) hours as needed for wheezing or shortness of breath. 09/10/17  Yes Francine Graven, DO  allopurinol (ZYLOPRIM) 100 MG tablet Take 200 mg by mouth 2 (two) times daily.  08/06/17  Yes [provider]  aspirin EC 81 MG tablet Take 81 mg by mouth daily.   Yes [provider]  atorvastatin (LIPITOR) 20 MG tablet Take 20 mg by mouth daily at 6  PM.  03/01/17 03/01/18 Yes [provider]  doxycycline (VIBRA-TABS) 100 MG tablet Take 1 tablet (100 mg total) by mouth 2 (two) times daily. 09/10/17  Yes Francine Graven, DO  lisinopril (PRINIVIL,ZESTRIL) 2.5 MG tablet Take 2.5 mg by mouth daily.   Yes [provider]  MAGNESIUM PO Take 1 tablet by mouth daily.   Yes [provider]  nicotine (NICODERM CQ) 21 mg/24hr patch Place 1 patch (21 mg total) onto the skin daily. 08/17/15  Yes Lendon Colonel, NP  potassium chloride (MICRO-K) 10 MEQ CR capsule Take 1 capsule by mouth daily. 06/29/16  Yes [provider]  torsemide (DEMADEX) 20 MG tablet Take 80 mg by mouth 2 (two) times daily.    Yes [provider]  vitamin B-12 (CYANOCOBALAMIN) 1000 MCG tablet Take 1,000 mcg by mouth daily.   Yes [provider]  predniSONE (DELTASONE) 20 MG tablet Take 2 tablets (40 mg total) by mouth daily. Patient not taking: Reported on 10/01/2017 09/10/17   Francine Graven, DO     Allergies:     Allergies  Allergen Reactions  . Amoxicillin Hives  . Penicillins Hives    Has patient had a PCN reaction causing immediate rash, facial/tongue/throat swelling, SOB or lightheadedness with hypotension: Unknown Has patient had a PCN reaction causing severe rash involving mucus membranes or skin necrosis: No Has patient had a PCN reaction that required hospitalization No Has patient had a PCN reaction occurring within the last 10 years: No If all of the above answers are "NO", then may proceed with Cephalosporin use.      Physical Exam:   Vitals  Blood pressure 114/64, pulse (!) 116, temperature 98.3 F (36.8 C), temperature source Oral, resp. rate (!) 25, height 5\' 7"  (1.702 m), weight 107.6 kg (237 lb 3.4 oz), SpO2 96 %.   1. General  lying in bed in NAD,   2. Normal affect and insight, Not Suicidal or Homicidal, Awake Alert, Oriented X 3.  3. No F.N deficits, ALL C.Nerves Intact, Strength 5/5 all  4 extremities, Sensation intact all 4 extremities, Plantars down going.  4.  Ears and Eyes appear Normal, Conjunctivae clear, PERRLA. Moist Oral Mucosa.  5. Supple Neck, No JVD, No cervical lymphadenopathy appriciated, No Carotid Bruits.  6. Symmetrical Chest wall movement, slight bilateral exp wheezing, no crackles.   7. RRR, No Gallops, Rubs or Murmurs, No Parasternal Heave.  8. Positive Bowel Sounds, Abdomen Soft, No tenderness, No organomegaly appriciated,No rebound -guarding or rigidity.  9.  No Cyanosis,  No Skin Rash or Bruise. 1+ edema  10. Good muscle tone,  joints appear normal , no effusions, Normal ROM.  11. No Palpable Lymph Nodes in Neck or Axillae     Data Review:    CBC Recent Labs  Lab 10/01/17 1246  WBC 7.4  HGB 13.8  HCT 51.1  PLT 205  MCV 75.9*  MCH 20.5*  MCHC 27.0*  RDW 24.2*  LYMPHSABS 0.7  MONOABS 0.3  EOSABS 0.0  BASOSABS 0.0   ------------------------------------------------------------------------------------------------------------------  Chemistries  Recent Labs  Lab 10/01/17 1246  NA 138  K 4.2  CL 91*  CO2 38*  GLUCOSE 137*  BUN 30*  CREATININE 1.32*  CALCIUM 8.8*  AST 16  ALT 16*  ALKPHOS 91  BILITOT 0.6   ------------------------------------------------------------------------------------------------------------------ estimated creatinine clearance is 61.8 mL/min (A) (by C-G formula based on SCr of 1.32 mg/dL (H)). ------------------------------------------------------------------------------------------------------------------ No results for input(s): TSH, T4TOTAL, T3FREE, THYROIDAB in the last 72 hours.  Invalid input(s): FREET3  Coagulation profile No results for input(s): INR, PROTIME in the last 168 hours. ------------------------------------------------------------------------------------------------------------------- No results for input(s): DDIMER in the last 72  hours. -------------------------------------------------------------------------------------------------------------------  Cardiac Enzymes No results for input(s): CKMB, TROPONINI, MYOGLOBIN in the last 168 hours.  Invalid input(s): CK ------------------------------------------------------------------------------------------------------------------    Component Value Date/Time   BNP 54.0 10/01/2017 1246     ---------------------------------------------------------------------------------------------------------------  Urinalysis    Component Value Date/Time   COLORURINE YELLOW 11/22/2015 1403   APPEARANCEUR CLEAR 11/22/2015 1403   LABSPEC 1.010 11/22/2015 1403   PHURINE 5.5 11/22/2015 1403   GLUCOSEU NEGATIVE 11/22/2015 1403   HGBUR NEGATIVE 11/22/2015 1403   BILIRUBINUR NEGATIVE 11/22/2015 1403   KETONESUR NEGATIVE 11/22/2015 1403   PROTEINUR NEGATIVE 11/22/2015 1403   NITRITE NEGATIVE 11/22/2015 1403   LEUKOCYTESUR NEGATIVE 11/22/2015 1403    ----------------------------------------------------------------------------------------------------------------   Imaging Results:    Dg Chest 2 View  Result Date: 10/01/2017 CLINICAL DATA:  Shortness of breath and lower extremity edema. EXAM: CHEST - 2 VIEW COMPARISON:  Chest x-ray dated Sep 10, 2017. FINDINGS: Stable mild cardiomegaly. Mild pulmonary vascular congestion and lower lobe predominant interstitial thickening, similar to prior study. New trace right pleural effusion with adjacent subsegmental atelectasis. No consolidation or pneumothorax. No acute osseous abnormality. IMPRESSION: 1. Pulmonary vascular congestion and mild interstitial edema with new trace right pleural effusion. Electronically Signed   By: Titus Dubin M.D.   On: 10/01/2017 12:10      Assessment & Plan:    Principal Problem:   COPD exacerbation ([place]) Active Problems:   CHF (congestive heart failure) ([facility])   Edema    Dyspnea secondary to Copd  exacerbation ddx CHF Solumedrol 80mg  iv q8h zithromax 500mg  iv qday Albuterol neb q6h and q6h prn  Dulera 2puff bid  Edema Lasix 40mg  iv x1 Check cmp in am Check cardiac echo  CHF (EF 55%) Cont lisinopril 2.5mg  po qday Cont torsemide 80mg  po bid Cont potassium Trop I q6h x3   CAD Cont aspirin Cont lipitor Start on carvedilol 3.125mg  po bid  Gout Cont allopurinol 200mg  po bid  DVT Prophylaxis Lovenox - SCDs  AM Labs Ordered, also please review Full Orders  Family Communication: Admission, patients condition and plan of care including tests being ordered have been discussed with the patient  who indicate understanding and agree with the plan and Code Status.  Code Status FULL CODE  Likely DC to  home  Condition GUARDED  Consults called: none  Admission status:  inpatient   Time spent in minutes : 45   Jani Gravel M.D on 10/01/2017 at 6:15 PM  Between 7am to 7pm - Pager - [phone]  . After 7pm go to [email or web address] - password [facility]  [facility] - Office  [phone]

## 2017-10-01 NOTE — ED Notes (Signed)
Hospitalist at bedside 

## 2017-10-02 ENCOUNTER — Inpatient Hospital Stay (HOSPITAL_COMMUNITY): Payer: Medicare Other

## 2017-10-02 ENCOUNTER — Other Ambulatory Visit: Payer: Self-pay

## 2017-10-02 DIAGNOSIS — J441 Chronic obstructive pulmonary disease with (acute) exacerbation: Principal | ICD-10-CM

## 2017-10-02 DIAGNOSIS — R609 Edema, unspecified: Secondary | ICD-10-CM

## 2017-10-02 DIAGNOSIS — I5032 Chronic diastolic (congestive) heart failure: Secondary | ICD-10-CM

## 2017-10-02 LAB — COMPREHENSIVE METABOLIC PANEL
ALT: 17 U/L (ref 17–63)
ANION GAP: 8 (ref 5–15)
AST: 16 U/L (ref 15–41)
Albumin: 3.9 g/dL (ref 3.5–5.0)
Alkaline Phosphatase: 90 U/L (ref 38–126)
BILIRUBIN TOTAL: 0.8 mg/dL (ref 0.3–1.2)
BUN: 35 mg/dL — AB (ref 6–20)
CO2: 39 mmol/L — ABNORMAL HIGH (ref 22–32)
Calcium: 9 mg/dL (ref 8.9–10.3)
Chloride: 91 mmol/L — ABNORMAL LOW (ref 101–111)
Creatinine, Ser: 1.48 mg/dL — ABNORMAL HIGH (ref 0.61–1.24)
GFR calc Af Amer: 54 mL/min — ABNORMAL LOW (ref >60)
GFR, EST NON AFRICAN AMERICAN: 47 mL/min — AB (ref >60)
Glucose, Bld: 155 mg/dL — ABNORMAL HIGH (ref 65–99)
POTASSIUM: 5.2 mmol/L — AB (ref 3.5–5.1)
Sodium: 138 mmol/L (ref 135–145)
TOTAL PROTEIN: 7.1 g/dL (ref 6.5–8.1)

## 2017-10-02 LAB — CBC
HEMATOCRIT: 55.9 % — AB (ref 39.0–52.0)
Hemoglobin: 14.8 g/dL (ref 13.0–17.0)
MCH: 20.9 pg — ABNORMAL LOW (ref 26.0–34.0)
MCHC: 26.5 g/dL — ABNORMAL LOW (ref 30.0–36.0)
MCV: 79.1 fL (ref 78.0–100.0)
Platelets: 245 10*3/uL (ref 150–400)
RBC: 7.07 MIL/uL — ABNORMAL HIGH (ref 4.22–5.81)
RDW: 22 % — AB (ref 11.5–15.5)
WBC: 8.5 10*3/uL (ref 4.0–10.5)

## 2017-10-02 LAB — BASIC METABOLIC PANEL
Anion gap: 11 (ref 5–15)
BUN: 37 mg/dL — ABNORMAL HIGH (ref 6–20)
CALCIUM: 8.6 mg/dL — AB (ref 8.9–10.3)
CO2: 39 mmol/L — AB (ref 22–32)
CREATININE: 1.37 mg/dL — AB (ref 0.61–1.24)
Chloride: 87 mmol/L — ABNORMAL LOW (ref 101–111)
GFR calc non Af Amer: 51 mL/min — ABNORMAL LOW (ref >60)
GFR, EST AFRICAN AMERICAN: 59 mL/min — AB (ref >60)
Glucose, Bld: 159 mg/dL — ABNORMAL HIGH (ref 65–99)
Potassium: 3.7 mmol/L (ref 3.5–5.1)
SODIUM: 137 mmol/L (ref 135–145)

## 2017-10-02 MED ORDER — METHYLPREDNISOLONE SODIUM SUCC 125 MG IJ SOLR
60.0000 mg | Freq: Three times a day (TID) | INTRAMUSCULAR | Status: DC
  Administered 2017-10-02 – 2017-10-03 (×3): 60 mg via INTRAVENOUS
  Filled 2017-10-02 (×3): qty 2

## 2017-10-02 MED ORDER — DOXYCYCLINE HYCLATE 100 MG PO TABS
100.0000 mg | ORAL_TABLET | Freq: Two times a day (BID) | ORAL | Status: DC
  Administered 2017-10-02 – 2017-10-03 (×3): 100 mg via ORAL
  Filled 2017-10-02 (×3): qty 1

## 2017-10-02 MED ORDER — SODIUM POLYSTYRENE SULFONATE 15 GM/60ML PO SUSP
45.0000 g | Freq: Once | ORAL | Status: AC
  Administered 2017-10-02: 45 g via ORAL
  Filled 2017-10-02: qty 180

## 2017-10-02 MED ORDER — IPRATROPIUM-ALBUTEROL 0.5-2.5 (3) MG/3ML IN SOLN
3.0000 mL | RESPIRATORY_TRACT | Status: DC
  Administered 2017-10-02 – 2017-10-03 (×7): 3 mL via RESPIRATORY_TRACT
  Filled 2017-10-02 (×8): qty 3

## 2017-10-02 NOTE — Evaluation (Addendum)
Physical Therapy Evaluation Patient Details Name: Galen Russman MRN: [id number] DOB: January 20, 1951 Today's Date: 10/02/2017   History of Present Illness  Edker Punt  is a 67 y.o. male, w Hypertension, CHF (EF 55-60%),  CAD,  Copd on home o2, apparently presents with c/o dyspnea for the past 2 weeks, worse today as well as bilateral leg swelling.   Pt denies fever, chills, cough, cp, palp, n/v, diarrhea, brbpr, black stool.     Clinical Impression  Patient is functioning at baseline for functional mobility and gait.  Plan:  Patient discharged from physical therapy to care of nursing for ambulation daily as tolerated for length of stay.    Follow Up Recommendations No PT follow up    Equipment Recommendations  None recommended by PT    Recommendations for Other Services       Precautions / Restrictions Precautions Precautions: None Restrictions Weight Bearing Restrictions: No      Mobility  Bed Mobility Overal bed mobility: Independent                Transfers Overall transfer level: Independent                  Ambulation/Gait Ambulation/Gait assistance: Modified independent (Device/Increase time) Gait Distance (Feet): 200 Feet Assistive device: None Gait Pattern/deviations: WFL(Within Functional Limits)     General Gait Details: good return for ambulation on level, inclined, declined surfaces without loss of balance  Stairs Stairs: Yes Stairs assistance: Modified independent (Device/Increase time) Stair Management: One rail Left;Alternating pattern;Sideways Number of Stairs: 4 General stair comments: demonstrates good return for going up steps with alternating pattern and stepping dow sideways without loss of balance  Wheelchair Mobility    Modified Rankin (Stroke Patients Only)       Balance Overall balance assessment: No apparent balance deficits (not formally assessed)                                            Pertinent Vitals/Pain Pain Assessment: No/denies pain    Home Living Family/patient expects to be discharged to:: Private residence Living Arrangements: Spouse/significant other Available Help at Discharge: Family Type of Home: Mobile home Home Access: Stairs to enter Entrance Stairs-Rails: Right;Can reach both;Left Entrance Stairs-Number of Steps: 4 Home Layout: One level Home Equipment: Cane - single point;Walker - 2 wheels;Bedside commode      Prior Function Level of Independence: Independent               Hand Dominance        Extremity/Trunk Assessment   Upper Extremity Assessment Upper Extremity Assessment: Overall WFL for tasks assessed    Lower Extremity Assessment Lower Extremity Assessment: Overall WFL for tasks assessed    Cervical / Trunk Assessment Cervical / Trunk Assessment: Normal  Communication   Communication: No difficulties  Cognition Arousal/Alertness: Awake/alert Behavior During Therapy: WFL for tasks assessed/performed Overall Cognitive Status: Within Functional Limits for tasks assessed                                        General Comments      Exercises     Assessment/Plan    PT Assessment Patent does not need any further PT services  PT Problem List         PT  Treatment Interventions      PT Goals (Current goals can be found in the Care Plan section)  Acute Rehab PT Goals Patient Stated Goal: return home PT Goal Formulation: With patient Time For Goal Achievement: 10-15-2017 Potential to Achieve Goals: Good    Frequency     Barriers to discharge        Co-evaluation               AM-PAC PT "6 Clicks" Daily Activity  Outcome Measure Difficulty turning over in bed (including adjusting bedclothes, sheets and blankets)?: None Difficulty moving from lying on back to sitting on the side of the bed? : None Difficulty sitting down on and standing up from a chair with arms (e.g.,  wheelchair, bedside commode, etc,.)?: None Help needed moving to and from a bed to chair (including a wheelchair)?: None Help needed walking in hospital room?: None Help needed climbing 3-5 steps with a railing? : None 6 Click Score: 24    End of Session Equipment Utilized During Treatment: Oxygen Activity Tolerance: Patient tolerated treatment well Patient left: in chair;with call bell/phone within reach Nurse Communication: Mobility status PT Visit Diagnosis: Unsteadiness on feet (R26.81);Other abnormalities of gait and mobility (R26.89);Muscle weakness (generalized) (M62.81)    Time: 5027-7412 PT Time Calculation (min) (ACUTE ONLY): 20 min   Charges:   PT Evaluation $PT Eval Low Complexity: 1 Low PT Treatments $Gait Training: 8-22 mins   PT G Codes:        12:01 PM, 2017/10/15 Lonell Grandchild, MPT Physical Therapist with [facility] 336 [phone] office [phone] mobile phone

## 2017-10-02 NOTE — Progress Notes (Addendum)
Found pt with O2 cannula on his forehead in no distress. Pt O2 saturation was 70%. Placed cannula in pt's nose and instructed him to take deep breaths. O2 increased to 92% on 3L [place]. Encouraged pt to leave cannula in his nose pt states he understands the importance

## 2017-10-02 NOTE — Progress Notes (Signed)
PROGRESS NOTE    Brian Parsons  ZOX:[id number]  DOB: 1950-11-17  DOA: 10/01/2017 PCP: [name], MD   Brief Admission Hx: Brian Parsons  is a 67 y.o. male, w Hypertension, CHF (EF 55-60%),  CAD,  Copd on home o2, apparently presents with c/o dyspnea for the past 2 weeks, worse today as well as bilateral leg swelling.   Pt denies fever, chills, cough, cp, palp, n/v, diarrhea, brbpr, black stool.   He was admitted with acute CHF exacerbation and COPD exacerbation.    MDM/Assessment & Plan:   1. COPD with acute exacerbation - Continue IV steroids, antibiotics and nebs.  Continue supportive therapy.  Start ambulation. 2. Acute CHF exacerbation - Pt was given IV lasix with good results, resumed home diuretics.  Follow weights, I/Os.  His EF was most recently noted to be 55%.   3. CAD - continue aspirin, lipitor. 4. Gout - continue allopurinol as ordered.   DVT prophylaxis: SCDs Code Status: Full  Family Communication: patient Disposition Plan: Home in 1-2 days if continues to improve clinically  Subjective: Pt says that he feels a little better after urinating a lot from the lasix given.  He is still SOB but nebulizers help a lot.    Objective: Vitals:   10/02/17 0207 10/02/17 0541 10/02/17 0752 10/02/17 0758  BP:  132/79    Pulse:  92    Resp:  20    Temp:  98.6 F (37 C)    TempSrc:      SpO2: 92% 96% 92% 92%  Weight:  112.2 kg (247 lb 5.7 oz)    Height:        Intake/Output Summary (Last 24 hours) at 10/02/2017 0904 Last data filed at 10/02/2017 0700 Gross per 24 hour  Intake 490 ml  Output 1950 ml  Net -1460 ml   Filed Weights   10/01/17 1144 10/01/17 1734 10/02/17 0541  Weight: 108 kg (238 lb) 107.6 kg (237 lb 3.4 oz) 112.2 kg (247 lb 5.7 oz)     REVIEW OF SYSTEMS  As per history otherwise all reviewed and reported negative  Exam:  General exam: awake, alert, NAD, cooperative, sitting up in chair.   Respiratory system: crackles at bases, some exp wheezes  noted.   No increased work of breathing. Cardiovascular system: S1 & S2 heard, RRR. No JVD, murmurs, gallops, clicks or pedal edema. Gastrointestinal system: Abdomen is nondistended, soft and nontender. Normal bowel sounds heard. Central nervous system: Alert and oriented. No focal neurological deficits. Extremities: 1+ pretibial edema BLEs.  Data Reviewed: Basic Metabolic Panel: Recent Labs  Lab 10/01/17 1246 10/02/17 0517  NA 138 138  K 4.2 5.2*  CL 91* 91*  CO2 38* 39*  GLUCOSE 137* 155*  BUN 30* 35*  CREATININE 1.32* 1.48*  CALCIUM 8.8* 9.0   Liver Function Tests: Recent Labs  Lab 10/01/17 1246 10/02/17 0517  AST 16 16  ALT 16* 17  ALKPHOS 91 90  BILITOT 0.6 0.8  PROT 6.9 7.1  ALBUMIN 3.8 3.9   No results for input(s): LIPASE, AMYLASE in the last 168 hours. No results for input(s): AMMONIA in the last 168 hours. CBC: Recent Labs  Lab 10/01/17 1246 10/02/17 0517  WBC 7.4 8.5  NEUTROABS 6.4  --   HGB 13.8 14.8  HCT 51.1 44.8  MCV 75.9* 79.1  PLT 205 245   Cardiac Enzymes: No results for input(s): CKTOTAL, CKMB, CKMBINDEX, TROPONINI in the last 168 hours. CBG (last 3)  No results for  input(s): GLUCAP in the last 72 hours. No results found for this or any previous visit (from the past 240 hour(s)).   Studies: Dg Chest 2 View  Result Date: 10/01/2017 CLINICAL DATA:  Shortness of breath and lower extremity edema. EXAM: CHEST - 2 VIEW COMPARISON:  Chest x-ray dated Sep 10, 2017. FINDINGS: Stable mild cardiomegaly. Mild pulmonary vascular congestion and lower lobe predominant interstitial thickening, similar to prior study. New trace right pleural effusion with adjacent subsegmental atelectasis. No consolidation or pneumothorax. No acute osseous abnormality. IMPRESSION: 1. Pulmonary vascular congestion and mild interstitial edema with new trace right pleural effusion. Electronically Signed   By: Titus Dubin M.D.   On: 10/01/2017 12:10   Dg Chest Port 1  View  Result Date: 10/02/2017 CLINICAL DATA:  Congestive heart failure EXAM: PORTABLE CHEST 1 VIEW COMPARISON:  10/01/2017 FINDINGS: Cardiac enlargement.  Pulmonary vascular congestion unchanged. Mild progression of bibasilar atelectasis.  Small right effusion. IMPRESSION: Pulmonary vascular congestion unchanged. Probable mild fluid overload Mild bibasilar atelectasis has progressed.  Small right effusion. Electronically Signed   By: Franchot Gallo M.D.   On: 10/02/2017 07:07     Scheduled Meds: . allopurinol  200 mg Oral BID  . aspirin EC  81 mg Oral Daily  . atorvastatin  20 mg Oral q1800  . carvedilol  3.125 mg Oral BID WC  . doxycycline  100 mg Oral Q12H  . enoxaparin (LOVENOX) injection  40 mg Subcutaneous Q24H  . ipratropium-albuterol  3 mL Nebulization Q4H  . methylPREDNISolone (SOLU-MEDROL) injection  60 mg Intravenous Q8H  . mometasone-formoterol  2 puff Inhalation BID  . nicotine  21 mg Transdermal Daily  . sodium chloride flush  3 mL Intravenous Q12H  . torsemide  80 mg Oral BID  . vitamin B-12  1,000 mcg Oral Daily   Continuous Infusions: . sodium chloride      Principal Problem:   COPD exacerbation (HCC) Active Problems:   CHF (congestive heart failure) (HCC)   Edema   Time spent:   Irwin Brakeman, MD, FAAFP [facility] Pager [phone]  If 7PM-7AM, please contact night-coverage [email or web address] Password [email or web address] 10/02/2017, 9:04 AM    LOS: 1 day

## 2017-10-03 ENCOUNTER — Encounter (HOSPITAL_COMMUNITY): Payer: Self-pay | Admitting: Family Medicine

## 2017-10-03 ENCOUNTER — Inpatient Hospital Stay (HOSPITAL_COMMUNITY): Payer: Medicare Other

## 2017-10-03 LAB — BASIC METABOLIC PANEL
Anion gap: 9 (ref 5–15)
BUN: 40 mg/dL — ABNORMAL HIGH (ref 6–20)
CHLORIDE: 86 mmol/L — AB (ref 101–111)
CO2: 43 mmol/L — AB (ref 22–32)
Calcium: 8.6 mg/dL — ABNORMAL LOW (ref 8.9–10.3)
Creatinine, Ser: 1.32 mg/dL — ABNORMAL HIGH (ref 0.61–1.24)
GFR calc non Af Amer: 53 mL/min — ABNORMAL LOW (ref >60)
Glucose, Bld: 165 mg/dL — ABNORMAL HIGH (ref 65–99)
POTASSIUM: 3.8 mmol/L (ref 3.5–5.1)
Sodium: 138 mmol/L (ref 135–145)

## 2017-10-03 LAB — CBC WITH DIFFERENTIAL/PLATELET
BASOS PCT: 0 %
Basophils Absolute: 0 10*3/uL (ref 0.0–0.1)
Eosinophils Absolute: 0 10*3/uL (ref 0.0–0.7)
Eosinophils Relative: 0 %
HEMATOCRIT: 53.7 % — AB (ref 39.0–52.0)
HEMOGLOBIN: 14.2 g/dL (ref 13.0–17.0)
LYMPHS ABS: 0.6 10*3/uL — AB (ref 0.7–4.0)
Lymphocytes Relative: 8 %
MCH: 20.9 pg — AB (ref 26.0–34.0)
MCHC: 26.4 g/dL — AB (ref 30.0–36.0)
MCV: 79.1 fL (ref 78.0–100.0)
MONOS PCT: 4 %
Monocytes Absolute: 0.3 10*3/uL (ref 0.1–1.0)
NEUTROS ABS: 7.2 10*3/uL (ref 1.7–7.7)
NEUTROS PCT: 88 %
Platelets: 227 10*3/uL (ref 150–400)
RBC: 6.79 MIL/uL — ABNORMAL HIGH (ref 4.22–5.81)
RDW: 21.8 % — ABNORMAL HIGH (ref 11.5–15.5)
WBC: 8.2 10*3/uL (ref 4.0–10.5)

## 2017-10-03 LAB — MAGNESIUM: Magnesium: 2 mg/dL (ref 1.7–2.4)

## 2017-10-03 LAB — HIV ANTIBODY (ROUTINE TESTING W REFLEX): HIV SCREEN 4TH GENERATION: NONREACTIVE

## 2017-10-03 MED ORDER — CARVEDILOL 3.125 MG PO TABS: 3.1250 mg | ORAL_TABLET | Freq: Two times a day (BID) | ORAL | 0 refills | Status: DC

## 2017-10-03 MED ORDER — DOXYCYCLINE HYCLATE 100 MG PO TABS: 100.0000 mg | ORAL_TABLET | Freq: Two times a day (BID) | ORAL | 0 refills | Status: AC

## 2017-10-03 MED ORDER — PREDNISONE 20 MG PO TABS: ORAL_TABLET | ORAL | 0 refills | Status: DC

## 2017-10-03 NOTE — Care Management Note (Signed)
Case Management Note  Patient Details  Name: Brian Parsons MRN: [id number] Date of Birth: 1950/07/24  Subjective/Objective:  Admitted with COPD/CHF. Chart reviewed for CM needs. Pt from home, ind, has home oxygen, has PCP, transportation and insurance with drug coverage.                   Action/Plan: DC home today with self care. No CM needs noted at this time.   Expected Discharge Date:  10/03/17               Expected Discharge Plan:  Home/Self Care  In-House Referral:  NA  Discharge planning Services  CM Consult  Post Acute Care Choice:  NA Choice offered to:  NA  Status of Service:  Completed, signed off  If discussed at Long Length of Stay Meetings, dates discussed:    Additional Comments:  Sherald Barge, RN 10/03/2017, 8:55 AM

## 2017-10-03 NOTE — Discharge Instructions (Signed)
Follow with Primary MD  [name], MD  and other consultant's as instructed your Hospitalist MD  Please get a complete blood count and chemistry panel checked by your Primary MD at your next visit, and again as instructed by your Primary MD.  Get Medicines reviewed and adjusted: Please take all your medications with you for your next visit with your Primary MD  Laboratory/radiological data: Please request your Primary MD to go over all hospital tests and procedure/radiological results at the follow up, please ask your Primary MD to get all Hospital records sent to his/her office.  In some cases, they will be blood work, cultures and biopsy results pending at the time of your discharge. Please request that your primary care M.D. follows up on these results.  Also Note the following: If you experience worsening of your admission symptoms, develop shortness of breath, life threatening emergency, suicidal or homicidal thoughts you must seek medical attention immediately by calling 911 or calling your MD immediately  if symptoms less severe.  You must read complete instructions/literature along with all the possible adverse reactions/side effects for all the Medicines you take and that have been prescribed to you. Take any new Medicines after you have completely understood and accpet all the possible adverse reactions/side effects.   Do not drive when taking Pain medications or sleeping medications (Benzodaizepines)  Do not take more than prescribed Pain, Sleep and Anxiety Medications. It is not advisable to combine anxiety,sleep and pain medications without talking with your primary care practitioner  Special Instructions: If you have smoked or chewed Tobacco  in the last 2 yrs please stop smoking, stop any regular Alcohol  and or any Recreational drug use.  Wear Seat belts while driving.  Please note: You were cared for by a hospitalist during your hospital stay. Once you are discharged,  your primary care physician will handle any further medical issues. Please note that NO REFILLS for any discharge medications will be authorized once you are discharged, as it is imperative that you return to your primary care physician (or establish a relationship with a primary care physician if you do not have one) for your post hospital discharge needs so that they can reassess your need for medications and monitor your lab values.      Acute Respiratory Failure, Adult Acute respiratory failure occurs when there is not enough oxygen passing from your lungs to your body. When this happens, your lungs have trouble removing carbon dioxide from the blood. This causes your blood oxygen level to drop too low as carbon dioxide builds up. Acute respiratory failure is a medical emergency. It can develop quickly, but it is temporary if treated promptly. Your lung capacity, or how much air your lungs can hold, may improve with time, exercise, and treatment. What are the causes? There are many possible causes of acute respiratory failure, including:  Lung injury.  Chest injury or damage to the ribs or tissues near the lungs.  Lung conditions that affect the flow of air and blood into and out of the lungs, such as pneumonia, acute respiratory distress syndrome, and cystic fibrosis.  Medical conditions, such as strokes or spinal cord injuries, that affect the muscles and nerves that control breathing.  Blood infection (sepsis).  Inflammation of the pancreas (pancreatitis).  A blood clot in the lungs (pulmonary embolism).  A large-volume blood transfusion.  Burns.  Near-drowning.  Seizure.  Smoke inhalation.  Reaction to medicines.  Alcohol or drug overdose.  What increases  the risk? This condition is more likely to develop in people who have:  A blocked airway.  Asthma.  A condition or disease that damages or weakens the muscles, nerves, bones, or tissues that are involved in  breathing.  A serious infection.  A health problem that blocks the unconscious reflex that is involved in breathing, such as hypothyroidism or sleep apnea.  A lung injury or trauma.  What are the signs or symptoms? Trouble breathing is the main symptom of acute respiratory failure. Symptoms may also include:  Rapid breathing.  Restlessness or anxiety.  Skin, lips, or fingernails that appear blue (cyanosis).  Rapid heart rate.  Abnormal heart rhythms (arrhythmias).  Confusion or changes in behavior.  Tiredness or loss of energy.  Feeling sleepy or having a loss of consciousness.  How is this diagnosed? Your health care provider can diagnose acute respiratory failure with a medical history and physical exam. During the exam, your health care provider will listen to your heart and check for crackling or wheezing sounds in your lungs. Your may also have tests to confirm the diagnosis and determine what is causing respiratory failure. These tests may include:  Measuring the amount of oxygen in your blood (pulse oximetry). The measurement comes from a small device that is placed on your finger, earlobe, or toe.  Other blood tests to measure blood gases and to look for signs of infection.  Sampling your cerebral spinal fluid or tracheal fluid to check for infections.  Chest X-ray to look for fluid in spaces that should be filled with air.  Electrocardiogram (ECG) to look at the heart's electrical activity.  How is this treated? Treatment for this condition usually takes places in a hospital intensive care unit (ICU). Treatment depends on what is causing the condition. It may include one or more treatments until your symptoms improve. Treatment may include:  Supplemental oxygen. Extra oxygen is given through a tube in the nose, a face mask, or a hood.  A device such as a continuous positive airway pressure (CPAP) or bi-level positive airway pressure (BiPAP or BPAP) machine. This  treatment uses mild air pressure to keep the airways open. A mask or other device will be placed over your nose or mouth. A tube that is connected to a motor will deliver oxygen through the mask.  Ventilator. This treatment helps move air into and out of the lungs. This may be done with a bag and mask or a machine. For this treatment, a tube is placed in your windpipe (trachea) so air and oxygen can flow to the lungs.  Extracorporeal membrane oxygenation (ECMO). This treatment temporarily takes over the function of the heart and lungs, supplying oxygen and removing carbon dioxide. ECMO gives the lungs a chance to recover. It may be used if a ventilator is not effective.  Tracheostomy. This is a procedure that creates a hole in the neck to insert a breathing tube.  Receiving fluids and medicines.  Rocking the bed to help breathing.  Follow these instructions at home:  Take over-the-counter and prescription medicines only as told by your health care provider.  Return to normal activities as told by your health care provider. Ask your health care provider what activities are safe for you.  Keep all follow-up visits as told by your health care provider. This is important. How is this prevented? Treating infections and medical conditions that may lead to acute respiratory failure can help prevent the condition from developing. Contact a health care  provider if:  You have a fever.  Your symptoms do not improve or they get worse. Get help right away if:  You are having trouble breathing.  You lose consciousness.  Your have cyanosis or turn blue.  You develop a rapid heart rate.  You are confused. These symptoms may represent a serious problem that is an emergency. Do not wait to see if the symptoms will go away. Get medical help right away. Call your local emergency services (911 in the [place] Do not drive yourself to the hospital. This information is not intended to replace advice  given to you by your health care provider. Make sure you discuss any questions you have with your health care provider. Document Released: 04/08/2013 Document Revised: 10/30/2015 Document Reviewed: 10/20/2015 Elsevier Interactive Patient Education  2018 [employer]   Chronic Obstructive Pulmonary Disease Exacerbation Chronic obstructive pulmonary disease (COPD) is a common lung problem. In COPD, the flow of air from the lungs is limited. COPD exacerbations are times that breathing gets worse and you need extra treatment. Without treatment they can be life threatening. If they happen often, your lungs can become more damaged. If your COPD gets worse, your doctor may treat you with:  Medicines.  Oxygen.  Different ways to clear your airway, such as using a mask.  Follow these instructions at home:  Do not smoke.  Avoid tobacco smoke and other things that bother your lungs.  If given, take your antibiotic medicine as told. Finish the medicine even if you start to feel better.  Only take medicines as told by your doctor.  Drink enough fluids to keep your pee (urine) clear or pale yellow (unless your doctor has told you not to).  Use a cool mist machine (vaporizer).  If you use oxygen or a machine that turns liquid medicine into a mist (nebulizer), continue to use them as told.  Keep up with shots (vaccinations) as told by your doctor.  Exercise regularly.  Eat healthy foods.  Keep all doctor visits as told. Get help right away if:  You are very short of breath and it gets worse.  You have trouble talking.  You have bad chest pain.  You have blood in your spit (sputum).  You have a fever.  You keep throwing up (vomiting).  You feel weak, or you pass out (faint).  You feel confused.  You keep getting worse. This information is not intended to replace advice given to you by your health care provider. Make sure you discuss any questions you have with your health  care provider. Document Released: 03/23/2011 Document Revised: 09/09/2015 Document Reviewed: 12/06/2012 Elsevier Interactive Patient Education  2017 [place].   Heart Failure Heart failure means your heart has trouble pumping blood. This makes it hard for your body to work well. Heart failure is usually a long-term (chronic) condition. You must take good care of yourself and follow your doctor's treatment plan. Follow these instructions at home:  Take your heart medicine as told by your doctor. ? Do not stop taking medicine unless your doctor tells you to. ? Do not skip any dose of medicine. ? Refill your medicines before they run out. ? Take other medicines only as told by your doctor or pharmacist.  Stay active if told by your doctor. The elderly and people with severe heart failure should talk with a doctor about physical activity.  Eat heart-healthy foods. Choose foods that are without trans fat and are low in saturated fat,  cholesterol, and salt (sodium). This includes fresh or frozen fruits and vegetables, fish, lean meats, fat-free or low-fat dairy foods, whole grains, and high-fiber foods. Lentils and dried peas and beans (legumes) are also good choices.  Limit salt if told by your doctor.  Cook in a healthy way. Roast, grill, broil, bake, poach, steam, or stir-fry foods.  Limit fluids as told by your doctor.  Weigh yourself every morning. Do this after you pee (urinate) and before you eat breakfast. Write down your weight to give to your doctor.  Take your blood pressure and write it down if your doctor tells you to.  Ask your doctor how to check your pulse. Check your pulse as told.  Lose weight if told by your doctor.  Stop smoking or chewing tobacco. Do not use gum or patches that help you quit without your doctor's approval.  Schedule and go to doctor visits as told.  Nonpregnant women should have no more than 1 drink a day. Men should have no more than 2 drinks  a day. Talk to your doctor about drinking alcohol.  Stop illegal drug use.  Stay current with shots (immunizations).  Manage your health conditions as told by your doctor.  Learn to manage your stress.  Rest when you are tired.  If it is really hot outside: ? Avoid intense activities. ? Use air conditioning or fans, or get in a cooler place. ? Avoid caffeine and alcohol. ? Wear loose-fitting, lightweight, and light-colored clothing.  If it is really cold outside: ? Avoid intense activities. ? Layer your clothing. ? Wear mittens or gloves, a hat, and a scarf when going outside. ? Avoid alcohol.  Learn about heart failure and get support as needed.  Get help to maintain or improve your quality of life and your ability to care for yourself as needed. Contact a doctor if:  You gain weight quickly.  You are more short of breath than usual.  You cannot do your normal activities.  You tire easily.  You cough more than normal, especially with activity.  You have any or more puffiness (swelling) in areas such as your hands, feet, ankles, or belly (abdomen).  You cannot sleep because it is hard to breathe.  You feel like your heart is beating fast (palpitations).  You get dizzy or light-headed when you stand up. Get help right away if:  You have trouble breathing.  There is a change in mental status, such as becoming less alert or not being able to focus.  You have chest pain or discomfort.  You faint. This information is not intended to replace advice given to you by your health care provider. Make sure you discuss any questions you have with your health care provider. Document Released: 01/11/2008 Document Revised: 09/09/2015 Document Reviewed: 05/20/2012 Elsevier Interactive Patient Education  2017 [place].   Heart Failure Action Plan A heart failure action plan helps you understand what to do when you have symptoms of heart failure. Follow the plan that  was created by you and your health care provider. Review your plan each time you visit your health care provider. Red zone These signs and symptoms mean you should get medical help right away:  You have trouble breathing when resting.  You have a dry cough that is getting worse.  You have swelling or pain in your legs or abdomen that is getting worse.  You suddenly gain more than 2-3 lb (0.9-1.4 kg) in a day, or more than  5 lb (2.3 kg) in one week. This amount may be more or less depending on your condition.  You have trouble staying awake or you feel confused.  You have chest pain.  You do not have an appetite.  You pass out.  If you experience any of these symptoms:  Call your local emergency services (911 in the [place] right away or seek help at the emergency department of the nearest hospital.  Yellow zone These signs and symptoms mean your condition may be getting worse and you should make some changes:  You have trouble breathing when you are active or you need to sleep with extra pillows.  You have swelling in your legs or abdomen.  You gain 2-3 lb (0.9-1.4 kg) in one day, or 5 lb (2.3 kg) in one week. This amount may be more or less depending on your condition.  You get tired easily.  You have trouble sleeping.  You have a dry cough.  If you experience any of these symptoms:  Contact your health care provider within the next day.  Your health care provider may adjust your medicines.  Green zone These signs mean you are doing well and can continue what you are doing:  You do not have shortness of breath.  You have very little swelling or no new swelling.  Your weight is stable (no gain or loss).  You have a normal activity level.  You do not have chest pain or any other new symptoms.  Follow these instructions at home:  Take over-the-counter and prescription medicines only as told by your health care provider.  Weigh yourself daily. Your target  weight is __________ lb (__________ kg). ? Call your health care provider if you gain more than __________ lb (__________ kg) in a day, or more than __________ lb (__________ kg) in one week.  Eat a heart-healthy diet. Work with a diet and nutrition specialist (dietitian) to create an eating plan that is best for you.  Keep all follow-up visits as told by your health care provider. This is important. Where to find more information:  American Heart Association: www.heart.org Summary  Follow the action plan that was created by you and your health care provider.  Get help right away if you have any symptoms in the Red zone. This information is not intended to replace advice given to you by your health care provider. Make sure you discuss any questions you have with your health care provider. Document Released: 05/13/2016 Document Revised: 05/13/2016 Document Reviewed: 05/13/2016 Elsevier Interactive Patient Education  2018 [place].   Heart Failure Eating Plan Heart failure, also called congestive heart failure, occurs when your heart does not pump blood well enough to meet your body's needs for oxygen-rich blood. Heart failure is a long-term (chronic) condition. Living with heart failure can be challenging. However, following your health care provider's instructions about a healthy lifestyle and working with a diet and nutrition specialist (dietitian) to choose the right foods may help to improve your symptoms. What are tips for following this plan? General guidelines  Do not eat more than 2,300 mg of salt (sodium) a day. The amount of sodium that is recommended for you may be lower, depending on your condition.  Maintain a healthy body weight as directed. Ask your health care provider what a healthy weight is for you. ? Check your weight every day. ? Work with your health care provider and dietitian to make a plan that is right for you to  lose weight or maintain your current  weight.  Limit how much fluid you drink. Ask your health care provider or dietitian how much fluid you can have each day.  Limit or avoid alcohol as told by your health care provider or dietitian. Reading food labels  Check food labels for the amount of sodium per serving. Choose foods that have less than 140 mg (milligrams) of sodium in each serving.  Check food labels for the number of calories per serving. This is important if you need to limit your daily calorie intake to lose weight.  Check food labels for the serving size. If you eat more than one serving, you will be eating more sodium and calories than what is listed on the label.  Look for foods that are labeled as "sodium-free," "very low sodium," or "low sodium." ? Foods labeled as "reduced sodium" or "lightly salted" may still have more sodium than what is recommended for you. Cooking  Avoid adding salt when cooking. Ask your health care provider or dietitian before using salt substitutes.  Season food with salt-free seasonings, spices, or herbs. Check the label of seasoning mixes to make sure they do not contain salt.  Cook with heart-healthy oils, such as olive, canola, soybean, or sunflower oil.  Do not fry foods. Cook foods using low-fat methods, such as baking, boiling, grilling, and broiling.  Limit unhealthy fats when cooking by: ? Removing the skin from poultry, such as chicken. ? Removing all visible fats from meats. ? Skimming the fat off from stews, soups, and gravies before serving them. Meal planning  Limit your intake of: ? Processed, canned, or pre-packaged foods. ? Foods that are high in trans fat, such as fried foods. ? Sweets, desserts, sugary drinks, and other foods with added sugar. ? Full-fat dairy products, such as whole milk.  Eat a balanced diet that includes: ? 4-5 servings of fruit each day and 4-5 servings of vegetables each day. At each meal, try to fill half of your plate with fruits and  vegetables. ? Up to 6-8 servings of whole grains each day. ? Up to 2 servings of lean meat, poultry, or fish each day. One serving of meat is equal to 3 oz. This is about the same size as a deck of cards. ? 2 servings of low-fat dairy each day. ? Heart-healthy fats. Healthy fats called omega-3 fatty acids are found in foods such as flaxseed and cold-water fish like sardines, salmon, and mackerel.  Aim to eat 25-35 g (grams) of fiber a day. Foods that are high in fiber include apples, broccoli, carrots, beans, peas, and whole grains.  Do not add salt or condiments that contain salt (such as soy sauce) to foods before eating.  When eating at a restaurant, ask that your food be prepared with less salt or no salt, if possible.  Try to eat 2 or more vegetarian meals each week.  Eat more home-cooked food and eat less restaurant, buffet, and fast food. Recommended foods The items listed may not be a complete list. Talk with your dietitian about what dietary choices are best for you. Grains Bread with less than 80 mg of sodium per slice. Whole-wheat pasta, quinoa, and brown rice. Oats and oatmeal. Barley. [place]. Grits and cream of wheat. Whole-grain and whole-wheat cold cereal. Vegetables All fresh vegetables. Vegetables that are frozen without sauce or added salt. Low-sodium or sodium-free canned vegetables. Fruits All fresh, frozen, and canned fruits. Dried fruits, such as raisins, prunes, and  cranberries. Meats and other protein foods Lean cuts of meat. Skinless chicken and [place]. Fish with high omega-3 fatty acids, such as salmon, sardines, and other cold-water fishes. Eggs. Dried beans, peas, and edamame. Unsalted nuts and nut butters. Dairy Low-fat or nonfat (skim) milk and dried milk. Rice milk, soy milk, and almond milk. Low-fat or nonfat yogurt. Small amounts of reduced-sodium block cheese. Low-sodium cottage cheese. Fats and oils Olive, canola, soybean, flaxseed, or sunflower oil.  Avocado. Sweets and desserts Apple sauce. Granola bars. Sugar-free pudding and gelatin. Frozen fruit bars. Seasoning and other foods Fresh and dried herbs. Lemon or lime juice. Vinegar. Low-sodium ketchup. Salt-free marinades, salad dressings, sauces, and seasonings. Foods to avoid The items listed may not be a complete list. Talk with your dietitian about what dietary choices are best for you. Grains Bread with more than 80 mg of sodium per slice. Hot or cold cereal with more than 140 mg sodium per serving. Salted pretzels and crackers. Pre-packaged breadcrumbs. Bagels, croissants, and biscuits. Vegetables Canned vegetables. Frozen vegetables with sauce or seasonings. Creamed vegetables. [place] fries. Onion rings. Pickled vegetables and sauerkraut. Fruits Fruits that are dried with sodium-containing preservatives. Meats and other protein foods Ribs and chicken wings. Bacon, ham, pepperoni, bologna, salami, and packaged luncheon meats. Hot dogs, bratwurst, and sausage. Canned meat. Smoked meat and fish. Salted nuts and seeds. Dairy Whole milk, half-and-half, and cream. Buttermilk. Processed cheese, cheese spreads, and cheese curds. Regular cottage cheese. Feta cheese. Shredded cheese. String cheese. Fats and oils Butter, lard, shortening, ghee, and bacon fat. Canned and packaged gravies. Seasoning and other foods Onion salt, garlic salt, table salt, and sea salt. Marinades. Regular salad dressings. Relishes, pickles, and olives. Meat flavorings and tenderizers, and bouillon cubes. Horseradish, ketchup, and mustard. Worcestershire sauce. Teriyaki sauce, soy sauce (including reduced sodium). Hot sauce and Tabasco sauce. Steak sauce, fish sauce, oyster sauce, and cocktail sauce. Taco seasonings. Barbecue sauce. Tartar sauce. Summary  A heart failure eating plan includes changes that limit your intake of sodium and unhealthy fat, and it may help you lose weight or maintain a healthy weight. Your  health care provider may also recommend limiting how much fluid you drink.  Most people with heart failure should eat no more than 2,300 mg of salt (sodium) a day. The amount of sodium that is recommended for you may be lower, depending on your condition.  Contact your health care provider or dietitian before making any major changes to your diet. This information is not intended to replace advice given to you by your health care provider. Make sure you discuss any questions you have with your health care provider. Document Released: 08/18/2016 Document Revised: 08/18/2016 Document Reviewed: 08/18/2016 Elsevier Interactive Patient Education  2018 [place] Risks of Smoking Smoking cigarettes is very bad for your health. Tobacco smoke has over 200 known poisons in it. It contains the poisonous gases nitrogen oxide and carbon monoxide. There are over 60 chemicals in tobacco smoke that cause cancer. Smoking is difficult to quit because a chemical in tobacco, called nicotine, causes addiction or dependence. When you smoke and inhale, nicotine is absorbed rapidly into the bloodstream through your lungs. Both inhaled and non-inhaled nicotine may be addictive. What are the risks of cigarette smoke? Cigarette smokers have an increased risk of many serious medical problems, including:  Lung cancer.  Lung disease, such as pneumonia, bronchitis, and emphysema.  Chest pain (angina) and heart attack because the heart is not getting enough oxygen.  Heart disease and peripheral blood vessel disease.  High blood pressure (hypertension).  Stroke.  Oral cancer, including cancer of the lip, mouth, or voice box.  Bladder cancer.  Pancreatic cancer.  Cervical cancer.  Pregnancy complications, including premature birth.  Stillbirths and smaller newborn babies, birth defects, and genetic damage to sperm.  Early menopause.  Lower estrogen level for women.  Infertility.  Facial  wrinkles.  Blindness.  Increased risk of broken bones (fractures).  Senile dementia.  Stomach ulcers and internal bleeding.  Delayed wound healing and increased risk of complications during surgery.  Even smoking lightly shortens your life expectancy by several years.  Because of secondhand smoke exposure, children of smokers have an increased risk of the following:  Sudden infant death syndrome (SIDS).  Respiratory infections.  Lung cancer.  Heart disease.  Ear infections.  What are the benefits of quitting? There are many health benefits of quitting smoking. Here are some of them:  Within days of quitting smoking, your risk of having a heart attack decreases, your blood flow improves, and your lung capacity improves. Blood pressure, pulse rate, and breathing patterns start returning to normal soon after quitting.  Within months, your lungs may clear up completely.  Quitting for 10 years reduces your risk of developing lung cancer and heart disease to almost that of a nonsmoker.  People who quit may see an improvement in their overall quality of life.  How do I quit smoking? Smoking is an addiction with both physical and psychological effects, and longtime habits can be hard to change. Your health care provider can recommend:  Programs and community resources, which may include group support, education, or talk therapy.  Prescription medicines to help reduce cravings.  Nicotine replacement products, such as patches, gum, and nasal sprays. Use these products only as directed. Do not replace cigarette smoking with electronic cigarettes, which are commonly called e-cigarettes. The safety of e-cigarettes is not known, and some may contain harmful chemicals.  A combination of two or more of these methods.  Where to find more information:  American Lung Association: www.lung.org  American Cancer Society: www.cancer.org Summary  Smoking cigarettes is very bad for your  health. Cigarette smokers have an increased risk of many serious medical problems, including several cancers, heart disease, and stroke.  Smoking is an addiction with both physical and psychological effects, and longtime habits can be hard to change.  By stopping right away, you can greatly reduce the risk of medical problems for you and your family.  To help you quit smoking, your health care provider can recommend programs, community resources, prescription medicines, and nicotine replacement products such as patches, gum, and nasal sprays. This information is not intended to replace advice given to you by your health care provider. Make sure you discuss any questions you have with your health care provider. Document Released: 05/11/2004 Document Revised: 04/07/2016 Document Reviewed: 04/07/2016 Elsevier Interactive Patient Education  2017 [employer].   Steps to Quit Smoking Smoking tobacco can be harmful to your health and can affect almost every organ in your body. Smoking puts you, and those around you, at risk for developing many serious chronic diseases. Quitting smoking is difficult, but it is one of the best things that you can do for your health. It is never too late to quit. What are the benefits of quitting smoking? When you quit smoking, you lower your risk of developing serious diseases and conditions, such as:  Lung cancer or lung disease, such as  COPD.  Heart disease.  Stroke.  Heart attack.  Infertility.  Osteoporosis and bone fractures.  Additionally, symptoms such as coughing, wheezing, and shortness of breath may get better when you quit. You may also find that you get sick less often because your body is stronger at fighting off colds and infections. If you are pregnant, quitting smoking can help to reduce your chances of having a baby of low birth weight. How do I get ready to quit? When you decide to quit smoking, create a plan to make sure that you are  successful. Before you quit:  Pick a date to quit. Set a date within the next two weeks to give you time to prepare.  Write down the reasons why you are quitting. Keep this list in places where you will see it often, such as on your bathroom mirror or in your car or wallet.  Identify the people, places, things, and activities that make you want to smoke (triggers) and avoid them. Make sure to take these actions: ? Throw away all cigarettes at home, at work, and in your car. ? Throw away smoking accessories, such as Scientist, research (medical). ? Clean your car and make sure to empty the ashtray. ? Clean your home, including curtains and carpets.  Tell your family, friends, and coworkers that you are quitting. Support from your loved ones can make quitting easier.  Talk with your health care provider about your options for quitting smoking.  Find out what treatment options are covered by your health insurance.  What strategies can I use to quit smoking? Talk with your healthcare provider about different strategies to quit smoking. Some strategies include:  Quitting smoking altogether instead of gradually lessening how much you smoke over a period of time. Research shows that quitting cold [place] is more successful than gradually quitting.  Attending in-person counseling to help you build problem-solving skills. You are more likely to have success in quitting if you attend several counseling sessions. Even short sessions of 10 minutes can be effective.  Finding resources and support systems that can help you to quit smoking and remain smoke-free after you quit. These resources are most helpful when you use them often. They can include: ? Online chats with a Social worker. ? Telephone quitlines. ? Careers information officer. ? Support groups or group counseling. ? Text messaging programs. ? Mobile phone applications.  Taking medicines to help you quit smoking. (If you are pregnant or  breastfeeding, talk with your health care provider first.) Some medicines contain nicotine and some do not. Both types of medicines help with cravings, but the medicines that include nicotine help to relieve withdrawal symptoms. Your health care provider may recommend: ? Nicotine patches, gum, or lozenges. ? Nicotine inhalers or sprays. ? Non-nicotine medicine that is taken by mouth.  Talk with your health care provider about combining strategies, such as taking medicines while you are also receiving in-person counseling. Using these two strategies together makes you more likely to succeed in quitting than if you used either strategy on its own. If you are pregnant or breastfeeding, talk with your health care provider about finding counseling or other support strategies to quit smoking. Do not take medicine to help you quit smoking unless told to do so by your health care provider. What things can I do to make it easier to quit? Quitting smoking might feel overwhelming at first, but there is a lot that you can do to make it easier. Take these important  actions:  Reach out to your family and friends and ask that they support and encourage you during this time. Call telephone quitlines, reach out to support groups, or work with a counselor for support.  Ask people who smoke to avoid smoking around you.  Avoid places that trigger you to smoke, such as bars, parties, or smoke-break areas at work.  Spend time around people who do not smoke.  Lessen stress in your life, because stress can be a smoking trigger for some people. To lessen stress, try: ? Exercising regularly. ? Deep-breathing exercises. ? Yoga. ? Meditating. ? Performing a body scan. This involves closing your eyes, scanning your body from head to toe, and noticing which parts of your body are particularly tense. Purposefully relax the muscles in those areas.  Download or purchase mobile phone or tablet apps (applications) that can  help you stick to your quit plan by providing reminders, tips, and encouragement. There are many free apps, such as QuitGuide from the [employer] Office manager for Disease Control and Prevention). You can find other support for quitting smoking (smoking cessation) through smokefree.gov and other websites.  How will I feel when I quit smoking? Within the first 24 hours of quitting smoking, you may start to feel some withdrawal symptoms. These symptoms are usually most noticeable 2-3 days after quitting, but they usually do not last beyond 2-3 weeks. Changes or symptoms that you might experience include:  Mood swings.  Restlessness, anxiety, or irritation.  Difficulty concentrating.  Dizziness.  Strong cravings for sugary foods in addition to nicotine.  Mild weight gain.  Constipation.  Nausea.  Coughing or a sore throat.  Changes in how your medicines work in your body.  A depressed mood.  Difficulty sleeping (insomnia).  After the first 2-3 weeks of quitting, you may start to notice more positive results, such as:  Improved sense of smell and taste.  Decreased coughing and sore throat.  Slower heart rate.  Lower blood pressure.  Clearer skin.  The ability to breathe more easily.  Fewer sick days.  Quitting smoking is very challenging for most people. Do not get discouraged if you are not successful the first time. Some people need to make many attempts to quit before they achieve long-term success. Do your best to stick to your quit plan, and talk with your health care provider if you have any questions or concerns. This information is not intended to replace advice given to you by your health care provider. Make sure you discuss any questions you have with your health care provider. Document Released: 03/28/2001 Document Revised: 11/30/2015 Document Reviewed: 08/18/2014 Elsevier Interactive Patient Education  2018 [employer].   Smoking Tobacco Information Smoking tobacco  will very likely harm your health. Tobacco contains a poisonous (toxic), colorless chemical called nicotine. Nicotine affects the brain and makes tobacco addictive. This change in your brain can make it hard to stop smoking. Tobacco also has other toxic chemicals that can hurt your body and raise your risk of many cancers. How can smoking tobacco affect me? Smoking tobacco can increase your chances of having serious health conditions, such as:  Cancer. Smoking is most commonly associated with lung cancer, but can lead to cancer in other parts of the body.  Chronic obstructive pulmonary disease (COPD). This is a long-term lung condition that makes it hard to breathe. It also gets worse over time.  High blood pressure (hypertension), heart disease, stroke, or heart attack.  Lung infections, such as pneumonia.  Cataracts. This is when the lenses in the eyes become clouded.  Digestive problems. This may include peptic ulcers, heartburn, and gastroesophageal reflux disease (GERD).  Oral health problems, such as gum disease and tooth loss.  Loss of taste and smell.  Smoking can affect your appearance by causing:  Wrinkles.  Yellow or stained teeth, fingers, and fingernails.  Smoking tobacco can also affect your social life.  Many workplaces, [employer], hotels, and public places are tobacco-free. This means that you may experience challenges in finding places to smoke when away from home.  The cost of a smoking habit can be expensive. Expenses for someone who smokes come in two ways: ? You spend money on a regular basis to buy tobacco. ? Your health care costs in the long-term are higher if you smoke.  Tobacco smoke can also affect the health of those around you. Children of smokers have greater chances of: ? Sudden infant death syndrome (SIDS). ? Ear infections. ? Lung infections.  What lifestyle changes can be made?  Do not start smoking. Quit if you already do.  To quit  smoking: ? Make a plan to quit smoking and commit yourself to it. Look for programs to help you and ask your health care provider for recommendations and ideas. ? Talk with your health care provider about using nicotine replacement medicines to help you quit. Medicine replacement medicines include gum, lozenges, patches, sprays, or pills. ? Do not replace cigarette smoking with electronic cigarettes, which are commonly called e-cigarettes. The safety of e-cigarettes is not known, and some may contain harmful chemicals. ? Avoid places, people, or situations that tempt you to smoke. ? If you try to quit but return to smoking, don't give up hope. It is very common for people to try a number of times before they fully succeed. When you feel ready again, give it another try.  Quitting smoking might affect the way you eat as well as your weight. Be prepared to monitor your eating habits. Get support in planning and following a healthy diet.  Ask your health care provider about having regular tests (screenings) to check for cancer. This may include blood tests, imaging tests, and other tests.  Exercise regularly. Consider taking walks, joining a gym, or doing yoga or exercise classes.  Develop skills to manage your stress. These skills include meditation. What are the benefits of quitting smoking? By quitting smoking, you may:  Lower your risk of getting cancer and other diseases caused by smoking.  Live longer.  Breathe better.  Lower your blood pressure and heart rate.  Stop your addiction to tobacco.  Stop creating secondhand smoke that hurts other people.  Improve your sense of taste and smell.  Look better over time, due to having fewer wrinkles and less staining.  What can happen if changes are not made? If you do not stop smoking, you may:  Get cancer and other diseases.  Develop COPD or other long-term (chronic) lung conditions.  Develop serious problems with your heart and  blood vessels (cardiovascular system).  Need more tests to screen for problems caused by smoking.  Have higher, long-term healthcare costs from medicines or treatments related to smoking.  Continue to have worsening changes in your lungs, mouth, and nose.  Where to find support: To get support to quit smoking, consider:  Asking your health care provider for more information and resources.  Taking classes to learn more about quitting smoking.  Looking for local organizations that offer resources about  quitting smoking.  Joining a support group for people who want to quit smoking in your local community.  Where to find more information: You may find more information about quitting smoking from:  HelpGuide.org: www.helpguide.org/articles/addictions/how-to-quit-smoking.htm  https://hall.com/: smokefree.gov  American Lung Association: www.lung.org  Contact a health care provider if:  You have problems breathing.  Your lips, nose, or fingers turn blue.  You have chest pain.  You are coughing up blood.  You feel faint or you pass out.  You have other noticeable changes that cause you to worry. Summary  Smoking tobacco can negatively affect your health, the health of those around you, your finances, and your social life.  Do not start smoking. Quit if you already do. If you need help quitting, ask your health care provider.  Think about joining a support group for people who want to quit smoking in your local community. There are many effective programs that will help you to quit this behavior. This information is not intended to replace advice given to you by your health care provider. Make sure you discuss any questions you have with your health care provider. Document Released: 04/18/2016 Document Revised: 04/18/2016 Document Reviewed: 04/18/2016 Elsevier Interactive Patient Education  [employer].

## 2017-10-03 NOTE — Discharge Summary (Addendum)
Physician Discharge Summary  Brian Parsons JOI:[id number] DOB: [birth year] DOA: 10/01/2017  PCP: [name], MD  Admit date: 10/01/2017 Discharge date: 10/03/2017  Admitted From: Home  Disposition: Home   Recommendations for Outpatient Follow-up:  1. Follow up with PCP in 1 weeks 2. Please obtain BMP/CBC in 1-2 week  Discharge Condition: STABLE   CODE STATUS: FULL    Brief Hospitalization Summary: Please see all hospital notes, images, labs for full details of the hospitalization.  Brief Admission Hx: Baileyis a67 y.o.male,w Hypertension, CHF (EF 55-60%), CAD, Copd on home o2, apparently presents with c/o dyspnea for the past 2 weeks, worse today as well as bilateral leg swelling. Pt denies fever, chills, cough, cp, palp, n/v, diarrhea, brbpr, black stool.   He was admitted with acute CHF exacerbation and COPD exacerbation.    MDM/Assessment & Plan:   1. COPD with acute exacerbation - Treated with IV steroids, antibiotics and nebs.  Continue supportive therapy.  Start ambulation.  He improved well with this treatment and asking to go home.  He will discharge on a prednisone taper and doxycycline and resume home nebs.  We counseled him to stop smoking.  Resume home oxygen.    2. Acute CHF exacerbation - Pt was given IV lasix with good results, resumed home diuretics.  Follow weights, I/Os.  His EF was most recently noted to be 55%.   3. CAD - continue aspirin, lipitor. 4. Gout - continue allopurinol as ordered.  5. Tobacco - counseled to stop smoking.  Follow up with PCP.   DVT prophylaxis: SCDs Code Status: Full  Family Communication: patient Disposition Plan: Home   Discharge Diagnoses:  Principal Problem:   COPD exacerbation ([place]) Active Problems:   CHF (congestive heart failure) ([place])   Edema  Discharge Instructions: Discharge Instructions    (HEART FAILURE PATIENTS) Call MD:  Anytime you have any of the following symptoms: 1) 3 pound weight gain in 24 hours  or 5 pounds in 1 week 2) shortness of breath, with or without a dry hacking cough 3) swelling in the hands, feet or stomach 4) if you have to sleep on extra pillows at night in order to breathe.   Complete by:  As directed    Call MD for:  difficulty breathing, headache or visual disturbances   Complete by:  As directed    Call MD for:  extreme fatigue   Complete by:  As directed    Call MD for:  persistant dizziness or light-headedness   Complete by:  As directed    Diet - low sodium heart healthy   Complete by:  As directed    Increase activity slowly   Complete by:  As directed      Allergies as of 10/03/2017      Reactions   Amoxicillin Hives   Penicillins Hives   Has patient had a PCN reaction causing immediate rash, facial/tongue/throat swelling, SOB or lightheadedness with hypotension: Unknown Has patient had a PCN reaction causing severe rash involving mucus membranes or skin necrosis: No Has patient had a PCN reaction that required hospitalization No Has patient had a PCN reaction occurring within the last 10 years: No If all of the above answers are "NO", then may proceed with Cephalosporin use.      Medication List    STOP taking these medications   lisinopril 2.5 MG tablet Commonly known as:  PRINIVIL,ZESTRIL     TAKE these medications   ADVAIR DISKUS 250-50 MCG/DOSE Aepb Generic drug:  Fluticasone-Salmeterol USE 1 INHALATION BY MOUTH EVERY 12 HOURS. <<RINSE MOUTH AFTER USE>>.   albuterol 108 (90 Base) MCG/ACT inhaler Commonly known as:  PROVENTIL HFA;VENTOLIN HFA Inhale 2 puffs into the lungs every 4 (four) hours as needed for wheezing or shortness of breath.   albuterol (2.5 MG/3ML) 0.083% nebulizer solution Commonly known as:  PROVENTIL Take 3 mLs (2.5 mg total) by nebulization every 4 (four) hours as needed for wheezing or shortness of breath.   allopurinol 100 MG tablet Commonly known as:  ZYLOPRIM Take 200 mg by mouth 2 (two) times daily.   aspirin EC  81 MG tablet Take 81 mg by mouth daily.   atorvastatin 20 MG tablet Commonly known as:  LIPITOR Take 20 mg by mouth daily at 6 PM.   carvedilol 3.125 MG tablet Commonly known as:  COREG Take 1 tablet (3.125 mg total) by mouth 2 (two) times daily with a meal.   doxycycline 100 MG tablet Commonly known as:  VIBRA-TABS Take 1 tablet (100 mg total) by mouth 2 (two) times daily for 5 days.   MAGNESIUM PO Take 1 tablet by mouth daily.   nicotine 21 mg/24hr patch Commonly known as:  NICODERM CQ Place 1 patch (21 mg total) onto the skin daily.   potassium chloride 10 MEQ CR capsule Commonly known as:  MICRO-K Take 1 capsule by mouth daily.   predniSONE 20 MG tablet Commonly known as:  DELTASONE Take 3 PO QAM x3days, 2 PO QAM x3days, 1 PO QAM x3days What changed:    how much to take  how to take this  when to take this  additional instructions   torsemide 20 MG tablet Commonly known as:  DEMADEX Take 80 mg by mouth 2 (two) times daily.   vitamin B-12 1000 MCG tablet Commonly known as:  CYANOCOBALAMIN Take 1,000 mcg by mouth daily.      Follow-up Information    [name], MD. Schedule an appointment as soon as possible for a visit in 1 week(s).   Specialty:  Family Medicine Why:  Hospital Follow up  Contact information: [address] [place] [address] [phone]          Allergies  Allergen Reactions  . Amoxicillin Hives  . Penicillins Hives    Has patient had a PCN reaction causing immediate rash, facial/tongue/throat swelling, SOB or lightheadedness with hypotension: Unknown Has patient had a PCN reaction causing severe rash involving mucus membranes or skin necrosis: No Has patient had a PCN reaction that required hospitalization No Has patient had a PCN reaction occurring within the last 10 years: No If all of the above answers are "NO", then may proceed with Cephalosporin use.    Allergies as of 10/03/2017      Reactions   Amoxicillin  Hives   Penicillins Hives   Has patient had a PCN reaction causing immediate rash, facial/tongue/throat swelling, SOB or lightheadedness with hypotension: Unknown Has patient had a PCN reaction causing severe rash involving mucus membranes or skin necrosis: No Has patient had a PCN reaction that required hospitalization No Has patient had a PCN reaction occurring within the last 10 years: No If all of the above answers are "NO", then may proceed with Cephalosporin use.      Medication List    STOP taking these medications   lisinopril 2.5 MG tablet Commonly known as:  PRINIVIL,ZESTRIL     TAKE these medications   ADVAIR DISKUS 250-50 MCG/DOSE Aepb Generic drug:  Fluticasone-Salmeterol  USE 1 INHALATION BY MOUTH EVERY 12 HOURS. <<RINSE MOUTH AFTER USE>>.   albuterol 108 (90 Base) MCG/ACT inhaler Commonly known as:  PROVENTIL HFA;VENTOLIN HFA Inhale 2 puffs into the lungs every 4 (four) hours as needed for wheezing or shortness of breath.   albuterol (2.5 MG/3ML) 0.083% nebulizer solution Commonly known as:  PROVENTIL Take 3 mLs (2.5 mg total) by nebulization every 4 (four) hours as needed for wheezing or shortness of breath.   allopurinol 100 MG tablet Commonly known as:  ZYLOPRIM Take 200 mg by mouth 2 (two) times daily.   aspirin EC 81 MG tablet Take 81 mg by mouth daily.   atorvastatin 20 MG tablet Commonly known as:  LIPITOR Take 20 mg by mouth daily at 6 PM.   carvedilol 3.125 MG tablet Commonly known as:  COREG Take 1 tablet (3.125 mg total) by mouth 2 (two) times daily with a meal.   doxycycline 100 MG tablet Commonly known as:  VIBRA-TABS Take 1 tablet (100 mg total) by mouth 2 (two) times daily for 5 days.   MAGNESIUM PO Take 1 tablet by mouth daily.   nicotine 21 mg/24hr patch Commonly known as:  NICODERM CQ Place 1 patch (21 mg total) onto the skin daily.   potassium chloride 10 MEQ CR capsule Commonly known as:  MICRO-K Take 1 capsule by mouth  daily.   predniSONE 20 MG tablet Commonly known as:  DELTASONE Take 3 PO QAM x3days, 2 PO QAM x3days, 1 PO QAM x3days What changed:    how much to take  how to take this  when to take this  additional instructions   torsemide 20 MG tablet Commonly known as:  DEMADEX Take 80 mg by mouth 2 (two) times daily.   vitamin B-12 1000 MCG tablet Commonly known as:  CYANOCOBALAMIN Take 1,000 mcg by mouth daily.       Procedures/Studies: Dg Chest 2 View  Result Date: 10/01/2017 CLINICAL DATA:  Shortness of breath and lower extremity edema. EXAM: CHEST - 2 VIEW COMPARISON:  Chest x-ray dated Sep 10, 2017. FINDINGS: Stable mild cardiomegaly. Mild pulmonary vascular congestion and lower lobe predominant interstitial thickening, similar to prior study. New trace right pleural effusion with adjacent subsegmental atelectasis. No consolidation or pneumothorax. No acute osseous abnormality. IMPRESSION: 1. Pulmonary vascular congestion and mild interstitial edema with new trace right pleural effusion. Electronically Signed   By: Titus Dubin M.D.   On: 10/01/2017 12:10   Dg Chest 2 View  Result Date: 09/10/2017 CLINICAL DATA:  Shortness of breath lower extremity edema EXAM: CHEST - 2 VIEW COMPARISON:  March 28, 2017 FINDINGS: There is no edema or consolidation. Heart is upper normal in size with pulmonary vascularity within normal limits. No adenopathy. No evident bone lesions. IMPRESSION: No edema or consolidation.  Heart upper normal in size. Electronically Signed   By: Lowella Grip III M.D.   On: 09/10/2017 08:10   Dg Chest Port 1 View  Result Date: 10/03/2017 CLINICAL DATA:  Acute CHF. History of COPD, chronic CHF, current smoker. EXAM: PORTABLE CHEST 1 VIEW COMPARISON:  Portable chest x-ray of October 02, 2017 FINDINGS: The lungs are adequately inflated. The interstitial markings are coarse. The pulmonary vascularity is engorged. The cardiac silhouette is enlarged. There is no pleural  effusion. There is calcification in the wall of the aortic arch. There is old deformity of the right clavicle. IMPRESSION: CHF with mild interstitial edema.  No alveolar pneumonia. Electronically Signed   By: Shanon Brow  [place] M.D.   On: 10/03/2017 08:16   Dg Chest Port 1 View  Result Date: 10/02/2017 CLINICAL DATA:  Congestive heart failure EXAM: PORTABLE CHEST 1 VIEW COMPARISON:  10/01/2017 FINDINGS: Cardiac enlargement.  Pulmonary vascular congestion unchanged. Mild progression of bibasilar atelectasis.  Small right effusion. IMPRESSION: Pulmonary vascular congestion unchanged. Probable mild fluid overload Mild bibasilar atelectasis has progressed.  Small right effusion. Electronically Signed   By: Franchot Gallo M.D.   On: 10/02/2017 07:07      Subjective: Pt says that he feels much better, no chest pain, no SOB, no fever or chills.   Discharge Exam: Vitals:   10/03/17 0752 10/03/17 0754  BP:    Pulse:    Resp:    Temp:    SpO2: 93% 93%   Vitals:   10/02/17 2341 10/03/17 0556 10/03/17 0752 10/03/17 0754  BP:  135/85    Pulse:  99    Resp:  20    Temp:  97.6 F (36.4 C)    TempSrc:  Oral    SpO2: 96% (!) 87% 93% 93%  Weight:  112.1 kg (247 lb 2.2 oz)    Height:       General: Pt is alert, awake, not in acute distress Cardiovascular: RRR, S1/S2 +, no rubs, no gallops Respiratory: CTA bilaterally, no wheezing, no rhonchi Abdominal: Soft, NT, ND, bowel sounds + Extremities: trace edema, no cyanosis, no clubbing.   The results of significant diagnostics from this hospitalization (including imaging, microbiology, ancillary and laboratory) are listed below for reference.     Microbiology: No results found for this or any previous visit (from the past 240 hour(s)).   Labs: BNP (last 3 results) Recent Labs    03/28/17 1323 09/10/17 0730 10/01/17 1246  BNP 74.0 39.0 45.8   Basic Metabolic Panel: Recent Labs  Lab 10/01/17 1246 10/02/17 0517 10/02/17 1507 10/03/17 0410   NA 138 138 137 138  K 4.2 5.2* 3.7 3.8  CL 91* 91* 87* 86*  CO2 38* 39* 39* 43*  GLUCOSE 137* 155* 159* 165*  BUN 30* 35* 37* 40*  CREATININE 1.32* 1.48* 1.37* 1.32*  CALCIUM 8.8* 9.0 8.6* 8.6*  MG  --   --   --  2.0   Liver Function Tests: Recent Labs  Lab 10/01/17 1246 10/02/17 0517  AST 16 16  ALT 16* 17  ALKPHOS 91 90  BILITOT 0.6 0.8  PROT 6.9 7.1  ALBUMIN 3.8 3.9   No results for input(s): LIPASE, AMYLASE in the last 168 hours. No results for input(s): AMMONIA in the last 168 hours. CBC: Recent Labs  Lab 10/01/17 1246 10/02/17 0517 10/03/17 0410  WBC 7.4 8.5 8.2  NEUTROABS 6.4  --  7.2  HGB 13.8 14.8 14.2  HCT 51.1 55.9* 53.7*  MCV 75.9* 79.1 79.1  PLT 205 245 227   Cardiac Enzymes: No results for input(s): CKTOTAL, CKMB, CKMBINDEX, TROPONINI in the last 168 hours. BNP: Invalid input(s): POCBNP CBG: No results for input(s): GLUCAP in the last 168 hours. D-Dimer No results for input(s): DDIMER in the last 72 hours. Hgb A1c No results for input(s): HGBA1C in the last 72 hours. Lipid Profile No results for input(s): CHOL, HDL, LDLCALC, TRIG, CHOLHDL, LDLDIRECT in the last 72 hours. Thyroid function studies No results for input(s): TSH, T4TOTAL, T3FREE, THYROIDAB in the last 72 hours.  Invalid input(s): FREET3 Anemia work up No results for input(s): VITAMINB12, FOLATE, FERRITIN, TIBC, IRON, RETICCTPCT in the last 72 hours. Urinalysis  Component Value Date/Time   COLORURINE YELLOW 11/22/2015 1403   APPEARANCEUR CLEAR 11/22/2015 1403   LABSPEC 1.010 11/22/2015 1403   PHURINE 5.5 11/22/2015 1403   GLUCOSEU NEGATIVE 11/22/2015 1403   HGBUR NEGATIVE 11/22/2015 1403   BILIRUBINUR NEGATIVE 11/22/2015 1403   KETONESUR NEGATIVE 11/22/2015 1403   PROTEINUR NEGATIVE 11/22/2015 1403   NITRITE NEGATIVE 11/22/2015 1403   LEUKOCYTESUR NEGATIVE 11/22/2015 1403   Sepsis Labs Invalid input(s): PROCALCITONIN,  WBC,  LACTICIDVEN Microbiology No results found  for this or any previous visit (from the past 240 hour(s)).  Time coordinating discharge: 33 mins  SIGNED:  Irwin Brakeman, MD  [facility] 10/03/2017, 8:38 AM Pager [phone]  If 7PM-7AM, please contact night-coverage [email or web address] Password [email or web address]

## 2017-10-23 ENCOUNTER — Ambulatory Visit (HOSPITAL_COMMUNITY): Payer: Medicare Other | Admitting: Internal Medicine

## 2017-10-23 ENCOUNTER — Other Ambulatory Visit (HOSPITAL_COMMUNITY): Payer: Medicare Other

## 2018-03-28 IMAGING — DX DG CHEST 2V
2 series · 2 of 2 positions shown · non-contrast
Comparison: Chest x-ray of July 27, 2016

CLINICAL DATA: Bilateral foot and ankle swelling for the past week.
Productive cough and shortness of breath. Current smoker. History of
CHF, Cels[place] OPD

EXAM:
CHEST  2 VIEW

[chest pa]
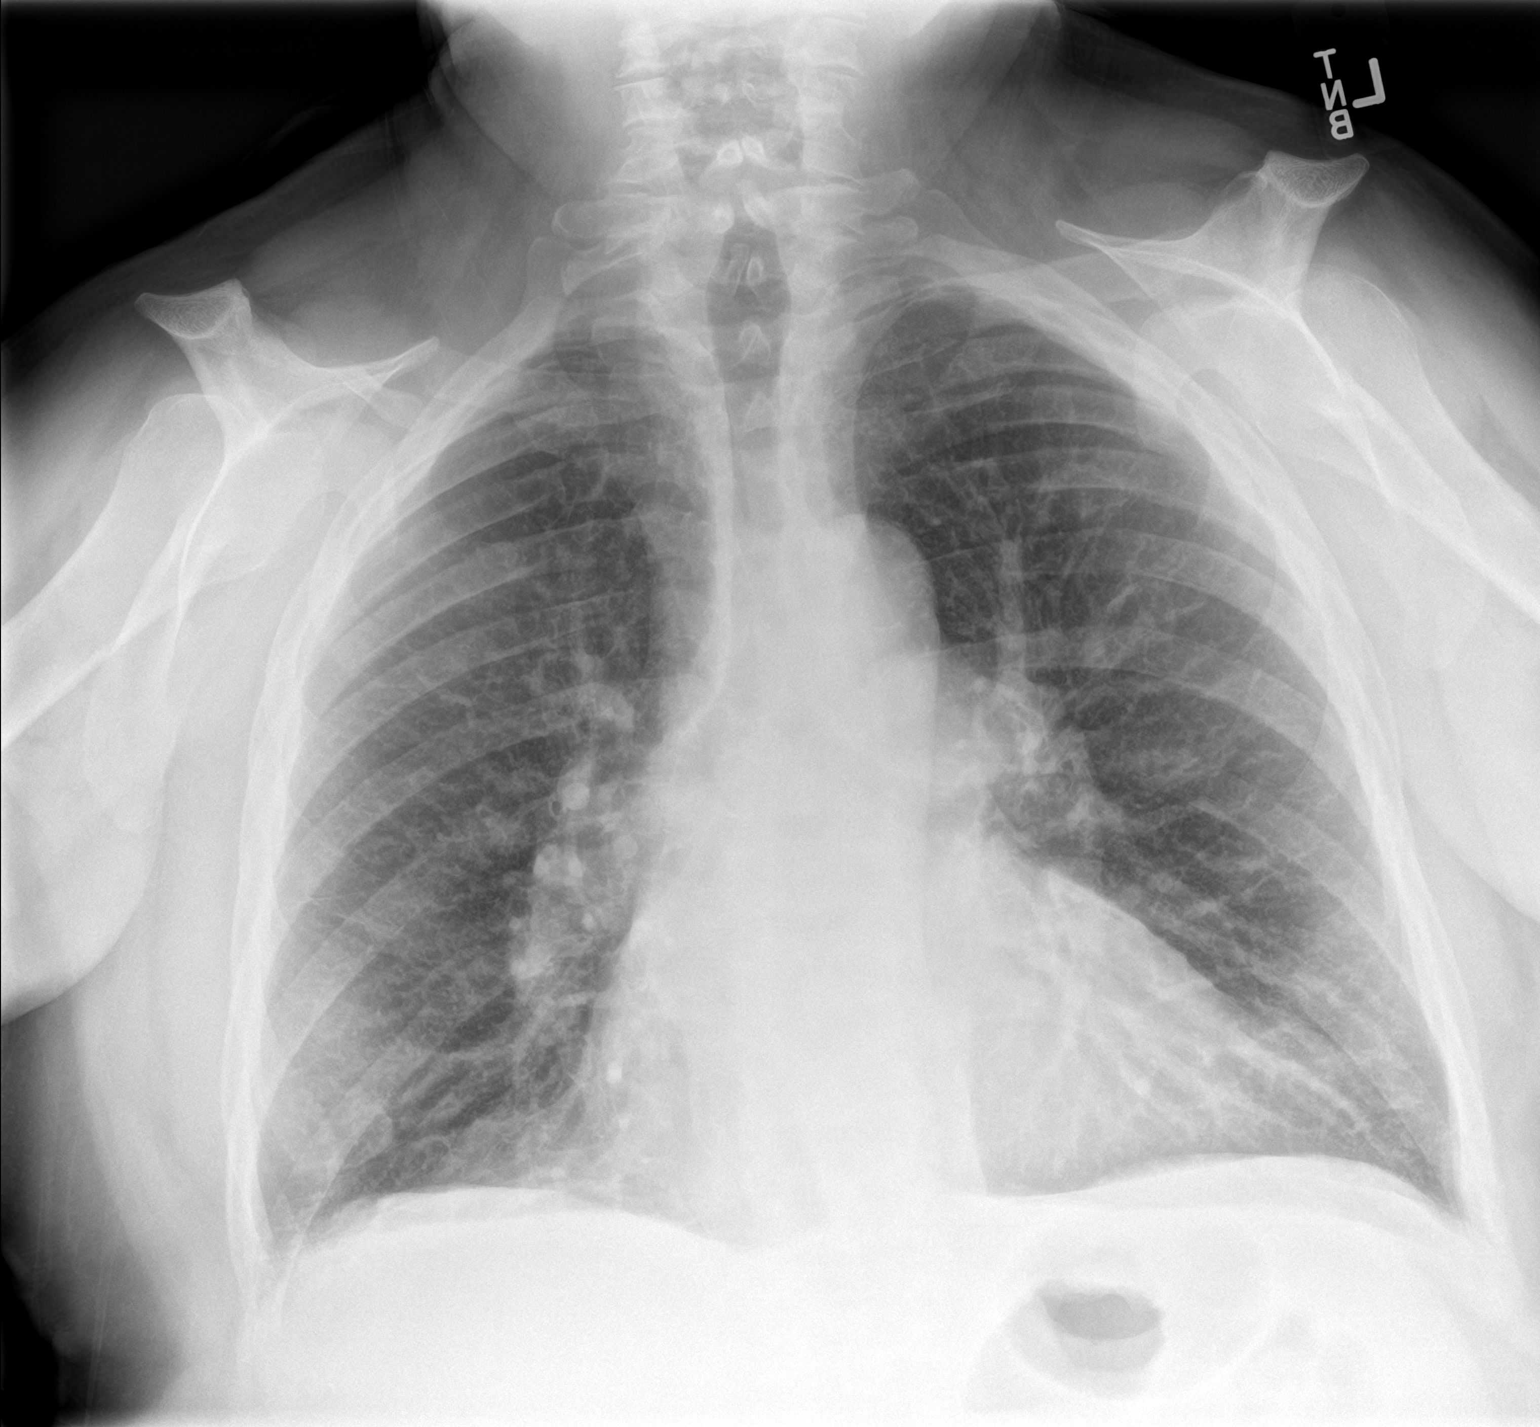

[chest lat]
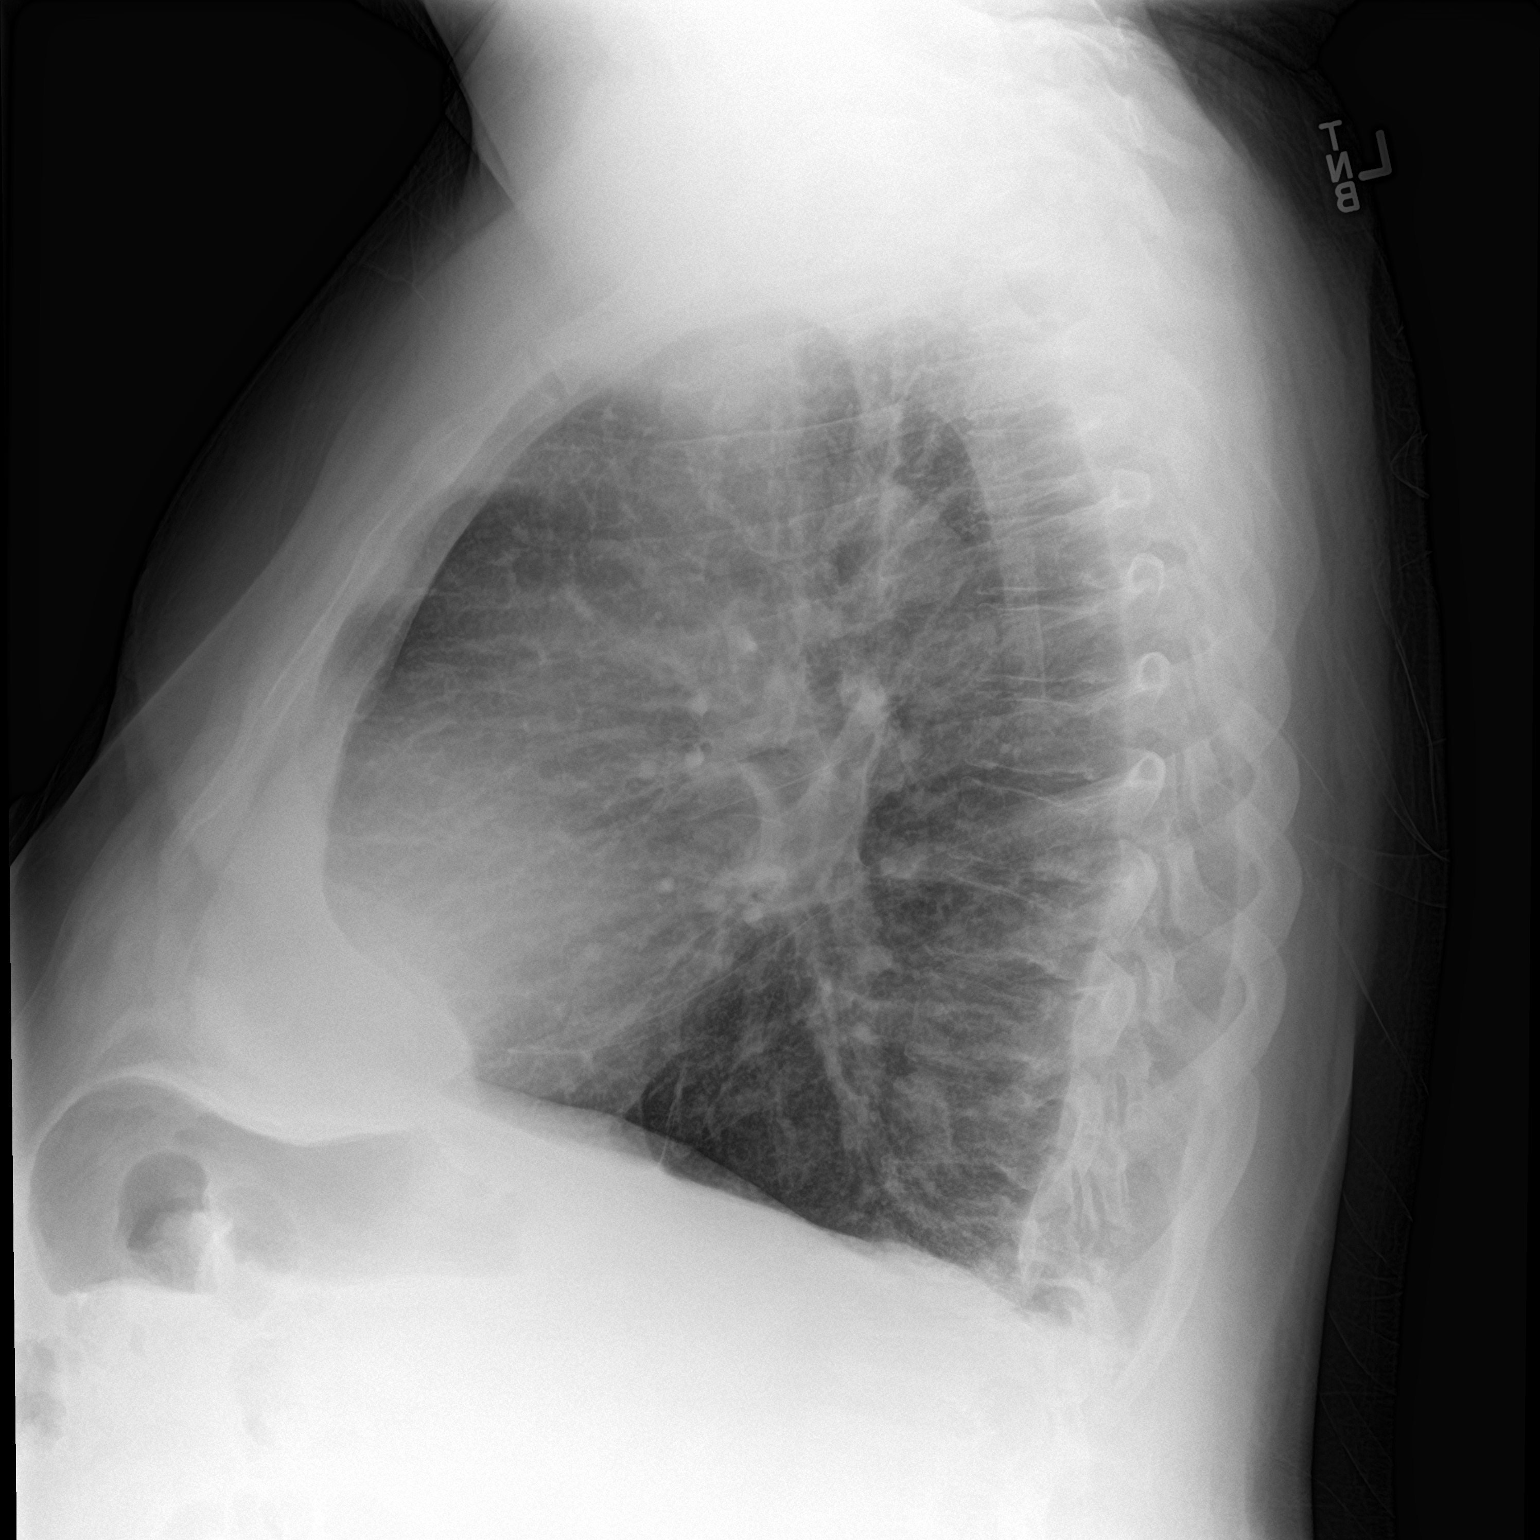

[2 of 2 positions shown; findings below may reference images not displayed]

FINDINGS: The lungs are adequately inflated. The interstitial markings are
coarse but have improved since the previous study. The cardiac
silhouette is mildly enlarged. The central pulmonary vascularity is
prominent but there is no definite cephalization. The mediastinum is
normal in width. There is no pleural effusion. The bony thorax is
unremarkable. The patient has undergone previous resection of the
lateral thirds of both clavicles.
IMPRESSION: Chronic bronchitic changes. Mild enlargement of the cardiac
silhouette. Mild pulmonary interstitial prominence has improved
since the previous study and likely reflects low-grade compensated
CHF.

## 2018-04-26 ENCOUNTER — Emergency Department (HOSPITAL_COMMUNITY): Payer: Medicare HMO

## 2018-04-26 ENCOUNTER — Encounter (HOSPITAL_COMMUNITY): Payer: Self-pay

## 2018-04-26 ENCOUNTER — Emergency Department (HOSPITAL_COMMUNITY)
Admission: EM | Admit: 2018-04-26 | Discharge: 2018-04-26 | Disposition: A | Discharge status: Home / Self Care | Payer: Medicare HMO | Attending: Emergency Medicine | Admitting: Emergency Medicine

## 2018-04-26 ENCOUNTER — Other Ambulatory Visit: Payer: Self-pay

## 2018-04-26 DIAGNOSIS — Z7982 Long term (current) use of aspirin: Secondary | ICD-10-CM | POA: Insufficient documentation

## 2018-04-26 DIAGNOSIS — J441 Chronic obstructive pulmonary disease with (acute) exacerbation: Secondary | ICD-10-CM | POA: Diagnosis not present

## 2018-04-26 DIAGNOSIS — I5032 Chronic diastolic (congestive) heart failure: Secondary | ICD-10-CM | POA: Diagnosis not present

## 2018-04-26 DIAGNOSIS — I110 Hypertensive heart disease with heart failure: Secondary | ICD-10-CM | POA: Insufficient documentation

## 2018-04-26 DIAGNOSIS — Z79899 Other long term (current) drug therapy: Secondary | ICD-10-CM | POA: Insufficient documentation

## 2018-04-26 DIAGNOSIS — R600 Localized edema: Secondary | ICD-10-CM | POA: Insufficient documentation

## 2018-04-26 DIAGNOSIS — R0602 Shortness of breath: Secondary | ICD-10-CM | POA: Diagnosis present

## 2018-04-26 LAB — CBC WITH DIFFERENTIAL/PLATELET
ABS IMMATURE GRANULOCYTES: 0.04 10*3/uL (ref 0.00–0.07)
Basophils Absolute: 0.1 10*3/uL (ref 0.0–0.1)
Basophils Relative: 1 %
Eosinophils Absolute: 0.3 10*3/uL (ref 0.0–0.5)
Eosinophils Relative: 3 %
HCT: 61.1 % — ABNORMAL HIGH (ref 39.0–52.0)
HEMOGLOBIN: 16.6 g/dL (ref 13.0–17.0)
IMMATURE GRANULOCYTES: 0 %
LYMPHS ABS: 1.7 10*3/uL (ref 0.7–4.0)
LYMPHS PCT: 18 %
MCH: 22.7 pg — ABNORMAL LOW (ref 26.0–34.0)
MCHC: 27.2 g/dL — ABNORMAL LOW (ref 30.0–36.0)
MCV: 83.7 fL (ref 80.0–100.0)
Monocytes Absolute: 1 10*3/uL (ref 0.1–1.0)
Monocytes Relative: 10 %
NEUTROS ABS: 6.5 10*3/uL (ref 1.7–7.7)
NEUTROS PCT: 68 %
Platelets: 184 10*3/uL (ref 150–400)
RBC: 7.3 MIL/uL — AB (ref 4.22–5.81)
RDW: 22.3 % — ABNORMAL HIGH (ref 11.5–15.5)
WBC: 9.6 10*3/uL (ref 4.0–10.5)
nRBC: 0 % (ref 0.0–0.2)

## 2018-04-26 LAB — COMPREHENSIVE METABOLIC PANEL
ALBUMIN: 4 g/dL (ref 3.5–5.0)
ALT: 14 U/L (ref 0–44)
AST: 16 U/L (ref 15–41)
Alkaline Phosphatase: 87 U/L (ref 38–126)
Anion gap: 13 (ref 5–15)
BUN: 39 mg/dL — AB (ref 8–23)
CHLORIDE: 81 mmol/L — AB (ref 98–111)
CO2: 43 mmol/L — AB (ref 22–32)
CREATININE: 1.36 mg/dL — AB (ref 0.61–1.24)
Calcium: 9.2 mg/dL (ref 8.9–10.3)
GFR calc Af Amer: >60 mL/min (ref >60)
GFR calc non Af Amer: 53 mL/min — ABNORMAL LOW (ref >60)
Glucose, Bld: 121 mg/dL — ABNORMAL HIGH (ref 70–99)
POTASSIUM: 3.5 mmol/L (ref 3.5–5.1)
SODIUM: 137 mmol/L (ref 135–145)
Total Bilirubin: 1 mg/dL (ref 0.3–1.2)
Total Protein: 7.1 g/dL (ref 6.5–8.1)

## 2018-04-26 LAB — BRAIN NATRIURETIC PEPTIDE: B NATRIURETIC PEPTIDE 5: 31 pg/mL (ref 0.0–100.0)

## 2018-04-26 LAB — TROPONIN I: Troponin I: <0.03 ng/mL (ref <0.03)

## 2018-04-26 MED ORDER — PREDNISONE 20 MG PO TABS: ORAL_TABLET | ORAL | 0 refills | Status: DC

## 2018-04-26 MED ORDER — ALBUTEROL SULFATE (2.5 MG/3ML) 0.083% IN NEBU
5.0000 mg | INHALATION_SOLUTION | Freq: Once | RESPIRATORY_TRACT | Status: AC
  Administered 2018-04-26: 5 mg via RESPIRATORY_TRACT
  Filled 2018-04-26: qty 6

## 2018-04-26 MED ORDER — ALBUTEROL (5 MG/ML) CONTINUOUS INHALATION SOLN
10.0000 mg/h | INHALATION_SOLUTION | RESPIRATORY_TRACT | Status: DC
  Administered 2018-04-26: 10 mg/h via RESPIRATORY_TRACT
  Filled 2018-04-26: qty 20

## 2018-04-26 MED ORDER — METHYLPREDNISOLONE SODIUM SUCC 125 MG IJ SOLR
125.0000 mg | Freq: Once | INTRAMUSCULAR | Status: AC
  Administered 2018-04-26: 125 mg via INTRAVENOUS
  Filled 2018-04-26: qty 2

## 2018-04-26 MED ORDER — AZITHROMYCIN 250 MG PO TABS: 250.0000 mg | ORAL_TABLET | Freq: Every day | ORAL | 0 refills | Status: DC

## 2018-04-26 MED ORDER — DOXYCYCLINE HYCLATE 100 MG PO CAPS: 100.0000 mg | ORAL_CAPSULE | Freq: Two times a day (BID) | ORAL | 0 refills | Status: DC

## 2018-04-26 NOTE — ED Provider Notes (Signed)
[facility] EMERGENCY DEPARTMENT Provider Note   CSN: [id number] Arrival date & time: 04/26/18  1009     History   Chief Complaint Chief Complaint  Patient presents with  . Shortness of Breath    HPI Brian Parsons is a 68 y.o. male.  HPI Patient has history of COPD and congestive heart failure on home O2.  Presents with increasing shortness of breath for the last week.  He has had cough productive of yellow sputum.  Mild bilateral lower extremity swelling.  Denies fever or chills.  No chest pain.  States he has been compliant with his medication. Past Medical History:  Diagnosis Date  . Bilateral leg edema   . CHF (congestive heart failure) ([place])   . COPD (chronic obstructive pulmonary disease) ([place])   . Coronary atherosclerosis    Nonobstructive at cardiac catheterization April 2017  . Essential hypertension   . On home O2    2L N/C prn  . Polycythemia     Patient Active Problem List   Diagnosis Date Noted  . Edema 10/01/2017  . CHF, acute on chronic ([place]) 04/29/2016  . AKI (acute kidney injury) ([place]) 04/29/2016  . CHF exacerbation ([place]) 04/29/2016  . CHF (congestive heart failure) ([place]) 02/24/2016  . Exertional dyspnea   . Abnormal nuclear stress test 08/04/2015  . Chronic diastolic heart failure ([place]) 08/04/2015  . Essential hypertension 08/04/2015  . Polycythemia, secondary 08/04/2015  . COPD exacerbation ([place]) 08/04/2015  . Tobacco abuse 08/02/2015  . Acute on chronic renal insufficiency 07/28/2015  . Acute diastolic CHF (congestive heart failure) ([place]) 07/28/2015  . Acute on chronic respiratory failure ([place]) 08/04/2014  . COPD (chronic obstructive pulmonary disease) ([place]) 08/03/2014  . Hypoxia 08/03/2014  . Lower extremity edema 08/03/2014  . Elevated troponin 08/03/2014  . Dyspnea 08/03/2014    Past Surgical History:  Procedure Laterality Date  . CARDIAC CATHETERIZATION N/A 08/10/2015   Procedure: Left Heart Cath and Coronary Angiography;  Surgeon:  Troy Sine, MD;  Location: [facility];  Service: Cardiovascular;  Laterality: N/A;        Home Medications    Prior to Admission medications   Medication Sig Start Date End Date Taking? Authorizing Provider  ADVAIR DISKUS 250-50 MCG/DOSE AEPB USE 1 INHALATION BY MOUTH EVERY 12 HOURS. <<RINSE MOUTH AFTER USE>>. 10/28/14   [place], Kelby Fam, MD  albuterol (PROVENTIL HFA;VENTOLIN HFA) 108 (90 Base) MCG/ACT inhaler Inhale 2 puffs into the lungs every 4 (four) hours as needed for wheezing or shortness of breath. 09/10/17   Francine Graven, DO  albuterol (PROVENTIL) (2.5 MG/3ML) 0.083% nebulizer solution Take 3 mLs (2.5 mg total) by nebulization every 4 (four) hours as needed for wheezing or shortness of breath. 09/10/17   Francine Graven, DO  allopurinol (ZYLOPRIM) 100 MG tablet Take 200 mg by mouth 2 (two) times daily.  08/06/17   [provider]  aspirin EC 81 MG tablet Take 81 mg by mouth daily.    [provider]  atorvastatin (LIPITOR) 20 MG tablet Take 20 mg by mouth daily at 6 PM.  03/01/17 03/01/18  [provider]  carvedilol (COREG) 3.125 MG tablet Take 1 tablet (3.125 mg total) by mouth 2 (two) times daily with a meal. 10/03/17 11/02/17  Johnson, Clanford L, MD  doxycycline (VIBRAMYCIN) 100 MG capsule Take 1 capsule (100 mg total) by mouth 2 (two) times daily. One po bid x 7 days 04/26/18   Julianne Rice, MD  MAGNESIUM PO Take 1 tablet  by mouth daily.    [provider]  nicotine (NICODERM CQ) 21 mg/24hr patch Place 1 patch (21 mg total) onto the skin daily. 08/17/15   Lendon Colonel, NP  potassium chloride (MICRO-K) 10 MEQ CR capsule Take 1 capsule by mouth daily. 06/29/16   [provider]  predniSONE (DELTASONE) 20 MG tablet 3 tabs po day one, then 2 po daily x 4 days 04/26/18   Julianne Rice, MD  torsemide (DEMADEX) 20 MG tablet Take 80 mg by mouth 2 (two) times daily.     [provider]  vitamin B-12  (CYANOCOBALAMIN) 1000 MCG tablet Take 1,000 mcg by mouth daily.    [provider]    Family History Family History  Problem Relation Age of Onset  . Stroke Mother   . Emphysema Father     Social History Social History   Tobacco Use  . Smoking status: Current Every Day Smoker    Packs/day: 0.50    Years: 50.00    Pack years: 25.00    Types: Cigarettes    Start date: 04/17/1961  . Smokeless tobacco: Never Used  Substance Use Topics  . Alcohol use: No    Alcohol/week: 0.0 standard drinks    Comment: No EtOH for 7 years  . Drug use: No     Allergies   Amoxicillin and Penicillins   Review of Systems Review of Systems  Constitutional: Negative for chills and fever.  Eyes: Negative for visual disturbance.  Respiratory: Positive for cough, shortness of breath and wheezing.   Cardiovascular: Positive for leg swelling. Negative for chest pain and palpitations.  Gastrointestinal: Negative for abdominal pain, constipation, diarrhea, nausea and vomiting.  Genitourinary: Negative for dysuria, flank pain, frequency and hematuria.  Musculoskeletal: Negative for back pain, myalgias and neck pain.  Skin: Negative for rash and wound.  Neurological: Negative for dizziness, weakness, light-headedness, numbness and headaches.  All other systems reviewed and are negative.    Physical Exam Updated Vital Signs BP 116/76   Pulse 91   Temp 97.8 F (36.6 C) (Oral)   Resp 18   Ht 5' 7.5" (1.715 m)   Wt 105.2 kg   SpO2 94%   BMI 35.80 kg/m   Physical Exam Vitals signs and nursing note reviewed.  Constitutional:      Appearance: Normal appearance. He is well-developed.  HENT:     Head: Normocephalic and atraumatic.     Mouth/Throat:     Mouth: Mucous membranes are moist.  Eyes:     Extraocular Movements: Extraocular movements intact.     Pupils: Pupils are equal, round, and reactive to light.  Neck:     Musculoskeletal: Normal range of motion and neck supple. No neck  rigidity or muscular tenderness.  Cardiovascular:     Rate and Rhythm: Normal rate and regular rhythm.  Pulmonary:     Effort: Pulmonary effort is normal.     Breath sounds: Wheezing present.     Comments: Expiratory wheezing throughout.  Diminished breath sounds in bilateral bases. Abdominal:     General: Bowel sounds are normal. There is no distension.     Palpations: Abdomen is soft.     Tenderness: There is no abdominal tenderness. There is no right CVA tenderness, left CVA tenderness, guarding or rebound.  Musculoskeletal: Normal range of motion.        General: No tenderness.     Right lower leg: Edema present.     Left lower leg: Edema present.  Comments: 1+ bilateral lower extremity pitting edema.  No asymmetry or tenderness.  Lymphadenopathy:     Cervical: No cervical adenopathy.  Skin:    General: Skin is warm and dry.     Capillary Refill: Capillary refill takes less than 2 seconds.     Findings: No erythema or rash.  Neurological:     General: No focal deficit present.     Mental Status: He is alert and oriented to person, place, and time.     Comments: Moving all extremities without focal deficit.  Sensation intact.  Psychiatric:        Mood and Affect: Mood normal.        Behavior: Behavior normal.      ED Treatments / Results  Labs (all labs ordered are listed, but only abnormal results are displayed) Labs Reviewed  CBC WITH DIFFERENTIAL/PLATELET - Abnormal; Notable for the following components:      Result Value   RBC 7.30 (*)    HCT 61.1 (*)    MCH 22.7 (*)    MCHC 27.2 (*)    RDW 22.3 (*)    All other components within normal limits  COMPREHENSIVE METABOLIC PANEL - Abnormal; Notable for the following components:   Chloride 81 (*)    CO2 43 (*)    Glucose, Bld 121 (*)    BUN 39 (*)    Creatinine, Ser 1.36 (*)    GFR calc non Af Amer 53 (*)    All other components within normal limits  BRAIN NATRIURETIC PEPTIDE  TROPONIN I     EKG None  Radiology Dg Chest 2 View  Result Date: 04/26/2018 CLINICAL DATA:  Shortness of breath. EXAM: CHEST - 2 VIEW COMPARISON:  Radiograph of October 03, 2017. FINDINGS: The heart size and mediastinal contours are within normal limits. Both lungs are clear. No pneumothorax or pleural effusion is noted. The visualized skeletal structures are unremarkable. IMPRESSION: No active cardiopulmonary disease. Electronically Signed   By: Marijo Conception, M.D.   On: 04/26/2018 10:47    Procedures Procedures (including critical care time)  Medications Ordered in ED Medications  albuterol (PROVENTIL,VENTOLIN) solution continuous neb (10 mg/hr Nebulization New Bag/Given 04/26/18 1232)  albuterol (PROVENTIL) (2.5 MG/3ML) 0.083% nebulizer solution 5 mg (5 mg Nebulization Given 04/26/18 1128)  methylPREDNISolone sodium succinate (SOLU-MEDROL) 125 mg/2 mL injection 125 mg (125 mg Intravenous Given 04/26/18 1114)     Initial Impression / Assessment and Plan / ED Course  I have reviewed the triage vital signs and the nursing notes.  Pertinent labs & imaging results that were available during my care of the patient were reviewed by me and considered in my medical decision making (see chart for details).     Patient with some improvement in his wheezing after initial breathing treatment.  Will give continuous and reassess. Wheezing is nearly completely resolved.  Patient states he is feeling much better after continuous treatment.  Suspect symptoms likely related to COPD exacerbation.  Will give short course of steroids and doxycycline.  Advised to follow-up with his primary physician.  Strict return precautions have been given. Final Clinical Impressions(s) / ED Diagnoses   Final diagnoses:  COPD exacerbation [facility])    ED Discharge Orders         Ordered    predniSONE (DELTASONE) 20 MG tablet     04/26/18 1415    doxycycline (VIBRAMYCIN) 100 MG capsule  2 times daily     04/26/18 1415  Julianne Rice, MD 04/26/18 1416

## 2018-04-26 NOTE — ED Triage Notes (Signed)
Pt presents to ED with complaints of SOB x 1 week. Pt states he went to his heart doctor yesterday and had gained 5 lbs. Pt c/o slight edema in bilateral lower extremities.

## 2018-11-25 LAB — HEMOGLOBIN A1C: Hemoglobin A1C: 8

## 2018-12-16 ENCOUNTER — Other Ambulatory Visit (HOSPITAL_COMMUNITY): Payer: Self-pay | Admitting: *Deleted

## 2018-12-16 DIAGNOSIS — D751 Secondary polycythemia: Secondary | ICD-10-CM

## 2018-12-17 ENCOUNTER — Other Ambulatory Visit: Payer: Self-pay

## 2018-12-17 ENCOUNTER — Inpatient Hospital Stay (HOSPITAL_COMMUNITY): Payer: Medicare HMO | Attending: Hematology

## 2018-12-17 ENCOUNTER — Inpatient Hospital Stay (HOSPITAL_BASED_OUTPATIENT_CLINIC_OR_DEPARTMENT_OTHER): Payer: Medicare HMO | Admitting: Hematology

## 2018-12-17 VITALS — BP 121/86 | HR 81 | Temp 97.3°F | Resp 21 | Wt 234.8 lb

## 2018-12-17 DIAGNOSIS — Z720 Tobacco use: Secondary | ICD-10-CM

## 2018-12-17 DIAGNOSIS — D751 Secondary polycythemia: Secondary | ICD-10-CM | POA: Insufficient documentation

## 2018-12-17 DIAGNOSIS — Z9981 Dependence on supplemental oxygen: Secondary | ICD-10-CM | POA: Diagnosis not present

## 2018-12-17 DIAGNOSIS — Z7982 Long term (current) use of aspirin: Secondary | ICD-10-CM | POA: Insufficient documentation

## 2018-12-17 DIAGNOSIS — N189 Chronic kidney disease, unspecified: Secondary | ICD-10-CM | POA: Insufficient documentation

## 2018-12-17 DIAGNOSIS — I2510 Atherosclerotic heart disease of native coronary artery without angina pectoris: Secondary | ICD-10-CM | POA: Diagnosis not present

## 2018-12-17 DIAGNOSIS — I509 Heart failure, unspecified: Secondary | ICD-10-CM | POA: Diagnosis not present

## 2018-12-17 DIAGNOSIS — F17210 Nicotine dependence, cigarettes, uncomplicated: Secondary | ICD-10-CM | POA: Diagnosis not present

## 2018-12-17 DIAGNOSIS — Z7951 Long term (current) use of inhaled steroids: Secondary | ICD-10-CM | POA: Insufficient documentation

## 2018-12-17 DIAGNOSIS — J449 Chronic obstructive pulmonary disease, unspecified: Secondary | ICD-10-CM | POA: Diagnosis not present

## 2018-12-17 DIAGNOSIS — Z79899 Other long term (current) drug therapy: Secondary | ICD-10-CM | POA: Diagnosis not present

## 2018-12-17 DIAGNOSIS — I130 Hypertensive heart and chronic kidney disease with heart failure and stage 1 through stage 4 chronic kidney disease, or unspecified chronic kidney disease: Secondary | ICD-10-CM | POA: Diagnosis not present

## 2018-12-17 LAB — CBC WITH DIFFERENTIAL/PLATELET
Abs Immature Granulocytes: 0.04 10*3/uL (ref 0.00–0.07)
Basophils Absolute: 0.1 10*3/uL (ref 0.0–0.1)
Basophils Relative: 1 %
Eosinophils Absolute: 0.3 10*3/uL (ref 0.0–0.5)
Eosinophils Relative: 3 %
HCT: 65.8 % — ABNORMAL HIGH (ref 39.0–52.0)
Hemoglobin: 20.9 g/dL — ABNORMAL HIGH (ref 13.0–17.0)
Immature Granulocytes: 0 %
Lymphocytes Relative: 30 %
Lymphs Abs: 2.8 10*3/uL (ref 0.7–4.0)
MCH: 30.3 pg (ref 26.0–34.0)
MCHC: 31.8 g/dL (ref 30.0–36.0)
MCV: 95.5 fL (ref 80.0–100.0)
Monocytes Absolute: 0.8 10*3/uL (ref 0.1–1.0)
Monocytes Relative: 8 %
Neutro Abs: 5.4 10*3/uL (ref 1.7–7.7)
Neutrophils Relative %: 58 %
Platelets: 122 10*3/uL — ABNORMAL LOW (ref 150–400)
RBC: 6.89 MIL/uL — ABNORMAL HIGH (ref 4.22–5.81)
RDW: 17.1 % — ABNORMAL HIGH (ref 11.5–15.5)
WBC: 9.2 10*3/uL (ref 4.0–10.5)
nRBC: 0 % (ref 0.0–0.2)

## 2018-12-17 LAB — COMPREHENSIVE METABOLIC PANEL
ALT: 26 U/L (ref 0–44)
AST: 22 U/L (ref 15–41)
Albumin: 4 g/dL (ref 3.5–5.0)
Alkaline Phosphatase: 95 U/L (ref 38–126)
Anion gap: 14 (ref 5–15)
BUN: 43 mg/dL — ABNORMAL HIGH (ref 8–23)
CO2: 44 mmol/L — ABNORMAL HIGH (ref 22–32)
Calcium: 9.9 mg/dL (ref 8.9–10.3)
Chloride: 82 mmol/L — ABNORMAL LOW (ref 98–111)
Creatinine, Ser: 1.55 mg/dL — ABNORMAL HIGH (ref 0.61–1.24)
GFR calc Af Amer: 52 mL/min — ABNORMAL LOW (ref >60)
GFR calc non Af Amer: 45 mL/min — ABNORMAL LOW (ref >60)
Glucose, Bld: 173 mg/dL — ABNORMAL HIGH (ref 70–99)
Potassium: 4.2 mmol/L (ref 3.5–5.1)
Sodium: 140 mmol/L (ref 135–145)
Total Bilirubin: 1.1 mg/dL (ref 0.3–1.2)
Total Protein: 7.2 g/dL (ref 6.5–8.1)

## 2018-12-17 LAB — FERRITIN: Ferritin: 105 ng/mL (ref 24–336)

## 2018-12-17 LAB — LACTATE DEHYDROGENASE: LDH: 160 U/L (ref 98–192)

## 2018-12-17 NOTE — Progress Notes (Signed)
[place], [place] [address]   CLINIC:  Medical Oncology/Hematology  PCP:  [name], MD [address] [place] [address] [phone]   REASON FOR VISIT:  Follow-up for Secondary Polycythemia   CURRENT THERAPY: Clinical Surveillance     INTERVAL HISTORY:  [name] 68 y.o. male  Presents today for follow up. He reports overall doing fair. He reports moderate fatigue. He reports SOB especially with exertion. He states he is actively trying to quit smoking. He has nicotine patches. His PCP is managing this for him. He wears oxygen at night. Since his last office visit he has been hospitalized for COPD exacerbation.  He also reports he was recently diagnosed with diabetes and chronic kidney disease.  He is scheduled to see an endocrinologist and nephrologist.  He is here for repeat labs and office visit. Denies chest pain.    REVIEW OF SYSTEMS:  Review of Systems  Constitutional: Positive for fatigue.  HENT:  Negative.   Eyes: Negative.   Respiratory: Positive for cough and shortness of breath.   Cardiovascular: Negative.   Gastrointestinal: Negative.   Endocrine: Negative.   Genitourinary: Negative.    Musculoskeletal: Positive for arthralgias, back pain and myalgias.  Skin: Negative.   Neurological: Positive for extremity weakness.  Hematological: Negative.   Psychiatric/Behavioral: Negative.      PAST MEDICAL/SURGICAL HISTORY:  Past Medical History:  Diagnosis Date  . Bilateral leg edema   . CHF (congestive heart failure) ([place])   . COPD (chronic obstructive pulmonary disease) ([place])   . Coronary atherosclerosis    Nonobstructive at cardiac catheterization April 2017  . Essential hypertension   . On home O2    2L N/C prn  . Polycythemia    Past Surgical History:  Procedure Laterality Date  . CARDIAC CATHETERIZATION N/A 08/10/2015   Procedure: Left Heart Cath and Coronary Angiography;  Surgeon: Troy Sine,  MD;  Location: [facility];  Service: Cardiovascular;  Laterality: N/A;     SOCIAL HISTORY:  Social History   Socioeconomic History  . Marital status: Divorced    Spouse name: Not on file  . Number of children: Not on file  . Years of education: Not on file  . Highest education level: Not on file  Occupational History  . Not on file  Social Needs  . Financial resource strain: Not on file  . Food insecurity    Worry: Not on file    Inability: Not on file  . Transportation needs    Medical: Not on file    Non-medical: Not on file  Tobacco Use  . Smoking status: Current Every Day Smoker    Packs/day: 0.50    Years: 50.00    Pack years: 25.00    Types: Cigarettes    Start date: 04/17/1961  . Smokeless tobacco: Never Used  Substance and Sexual Activity  . Alcohol use: No    Alcohol/week: 0.0 standard drinks    Comment: No EtOH for 7 years  . Drug use: No  . Sexual activity: Yes  Lifestyle  . Physical activity    Days per week: Not on file    Minutes per session: Not on file  . Stress: Not on file  Relationships  . Social Herbalist on phone: Not on file    Gets together: Not on file    Attends religious service: Not on file    Active member of  club or organization: Not on file    Attends meetings of clubs or organizations: Not on file    Relationship status: Not on file  . Intimate partner violence    Fear of current or ex partner: Not on file    Emotionally abused: Not on file    Physically abused: Not on file    Forced sexual activity: Not on file  Other Topics Concern  . Not on file  Social History Narrative  . Not on file    FAMILY HISTORY:  Family History  Problem Relation Age of Onset  . Stroke Mother   . Emphysema Father     CURRENT MEDICATIONS:  Outpatient Encounter Medications as of 12/17/2018  Medication Sig  . albuterol (PROVENTIL) (2.5 MG/3ML) 0.083% nebulizer solution Take 3 mLs (2.5 mg total) by nebulization every 4 (four)  hours as needed for wheezing or shortness of breath.  . allopurinol (ZYLOPRIM) 100 MG tablet Take 200 mg by mouth 2 (two) times daily.   Marland Kitchen aspirin EC 81 MG tablet Take 81 mg by mouth daily.  Marland Kitchen atorvastatin (LIPITOR) 20 MG tablet Take 20 mg by mouth daily at 6 PM.   . carvedilol (COREG) 3.125 MG tablet Take 1 tablet (3.125 mg total) by mouth 2 (two) times daily with a meal.  . Fluticasone-Umeclidin-Vilant (TRELEGY ELLIPTA) 100-62.5-25 MCG/INH AEPB Inhale 1 puff into the lungs daily.  . Magnesium Gluconate 500 (27 Mg) MG TABS Take by mouth.  . nicotine (NICODERM CQ) 21 mg/24hr patch Place 1 patch (21 mg total) onto the skin daily.  . potassium chloride (MICRO-K) 10 MEQ CR capsule Take 1 capsule by mouth daily.  Marland Kitchen torsemide (DEMADEX) 20 MG tablet Take 80 mg by mouth 2 (two) times daily.   . vitamin B-12 (CYANOCOBALAMIN) 1000 MCG tablet Take 1,000 mcg by mouth daily.  . [DISCONTINUED] albuterol (PROVENTIL HFA;VENTOLIN HFA) 108 (90 Base) MCG/ACT inhaler Inhale 2 puffs into the lungs every 4 (four) hours as needed for wheezing or shortness of breath.  . [DISCONTINUED] azithromycin (ZITHROMAX) 250 MG tablet Take 1 tablet (250 mg total) by mouth daily. Take first 2 tablets together, then 1 every day until finished.  . [DISCONTINUED] budesonide-formoterol (SYMBICORT) 160-4.5 MCG/ACT inhaler Inhale 2 puffs into the lungs 2 (two) times daily.   . [DISCONTINUED] predniSONE (DELTASONE) 20 MG tablet 3 tabs po day one, then 2 po daily x 4 days  . [DISCONTINUED] tiotropium (SPIRIVA) 18 MCG inhalation capsule Place 18 mcg into inhaler and inhale daily.    No facility-administered encounter medications on file as of 12/17/2018.     ALLERGIES:  Allergies  Allergen Reactions  . Amoxicillin Hives  . Penicillins Hives    Has patient had a PCN reaction causing immediate rash, facial/tongue/throat swelling, SOB or lightheadedness with hypotension: Unknown Has patient had a PCN reaction causing severe rash involving  mucus membranes or skin necrosis: No Has patient had a PCN reaction that required hospitalization No Has patient had a PCN reaction occurring within the last 10 years: No If all of the above answers are "NO", then may proceed with Cephalosporin use.      PHYSICAL EXAM:  ECOG Performance status: 2  Vitals:   12/17/18 1324  BP: 121/86  Pulse: 81  Resp: (!) 21  Temp: (!) 97.3 F (36.3 C)  SpO2: 90%   Filed Weights   12/17/18 1324  Weight: 234 lb 12.8 oz (106.5 kg)    Physical Exam Constitutional:      Appearance: Normal  appearance. He is obese.  HENT:     Head: Normocephalic.     Right Ear: External ear normal.     Left Ear: External ear normal.     Nose: Nose normal.     Mouth/Throat:     Mouth: Mucous membranes are moist.     Pharynx: Oropharynx is clear.  Eyes:     Extraocular Movements: Extraocular movements intact.     Conjunctiva/sclera: Conjunctivae normal.  Neck:     Musculoskeletal: Normal range of motion.  Cardiovascular:     Rate and Rhythm: Normal rate and regular rhythm.     Pulses: Normal pulses.     Heart sounds: Normal heart sounds.  Pulmonary:     Effort: Pulmonary effort is normal.     Breath sounds: Wheezing present.  Abdominal:     General: Abdomen is flat. Bowel sounds are normal.     Palpations: Abdomen is soft.  Musculoskeletal:     Comments: Decreased ROM and strength   Skin:    General: Skin is warm.     Findings: Erythema present.  Neurological:     General: No focal deficit present.     Mental Status: He is alert and oriented to person, place, and time.  Psychiatric:        Mood and Affect: Mood normal.        Behavior: Behavior normal.        Thought Content: Thought content normal.        Judgment: Judgment normal.      LABORATORY DATA:  I have reviewed the labs as listed.  CBC    Component Value Date/Time   WBC 9.2 12/17/2018 1146   RBC 6.89 (H) 12/17/2018 1146   HGB 20.9 (H) 12/17/2018 1146   HCT 65.8 (H)  12/17/2018 1146   PLT 122 (L) 12/17/2018 1146   MCV 95.5 12/17/2018 1146   MCH 30.3 12/17/2018 1146   MCHC 31.8 12/17/2018 1146   RDW 17.1 (H) 12/17/2018 1146   LYMPHSABS 2.8 12/17/2018 1146   MONOABS 0.8 12/17/2018 1146   EOSABS 0.3 12/17/2018 1146   BASOSABS 0.1 12/17/2018 1146   CMP Latest Ref Rng & Units 12/17/2018 04/26/2018 10/03/2017  Glucose 70 - 99 mg/dL 173(H) 121(H) 165(H)  BUN 8 - 23 mg/dL 43(H) 39(H) 40(H)  Creatinine 0.61 - 1.24 mg/dL 1.55(H) 1.36(H) 1.32(H)  Sodium 135 - 145 mmol/L 140 137 138  Potassium 3.5 - 5.1 mmol/L 4.2 3.5 3.8  Chloride 98 - 111 mmol/L 82(L) 81(L) 86(L)  CO2 22 - 32 mmol/L 44(H) 43(H) 43(H)  Calcium 8.9 - 10.3 mg/dL 9.9 9.2 8.6(L)  Total Protein 6.5 - 8.1 g/dL 7.2 7.1 -  Total Bilirubin 0.3 - 1.2 mg/dL 1.1 1.0 -  Alkaline Phos 38 - 126 U/L 95 87 -  AST 15 - 41 U/L 22 16 -  ALT 0 - 44 U/L 26 14 -       ASSESSMENT & PLAN:   Polycythemia, secondary 1. 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   - JAK/MPL/CALR mutations are negative. - Receives periodic phlebotomies -Blood work today reveals a hemoglobin of 20.9 and hematocrit of 65.8.  I have recommended patient to receive therapeutic phlebotomy.  Also advised patient to restart aspirin 81 mg daily.  Goal is to maintain hematocrit of 50 or less.   2.  Tobacco abuse -Patient has a 57-year 1 pack/day smoking history.  Have recommended patient for a low-dose CT lung cancer  screening.      Orders placed this encounter:  Orders Placed This Encounter  Procedures  . CT CHEST LUNG CA SCREEN LOW DOSE W/O CM  . CBC with Differential  . Comprehensive metabolic panel  . Lactate dehydrogenase      Roger Shelter, [place] [phone]

## 2018-12-17 NOTE — Assessment & Plan Note (Addendum)
1. 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   - JAK/MPL/CALR mutations are negative. - Receives periodic phlebotomies -Blood work today reveals a hemoglobin of 20.9 and hematocrit of 65.8.  I have recommended patient to receive therapeutic phlebotomy.  Also advised patient to restart aspirin 81 mg daily.  Goal is to maintain hematocrit of 50 or less.   2.  Tobacco abuse -Patient has a 57-year 1 pack/day smoking history.  Have recommended patient for a low-dose CT lung cancer screening.

## 2018-12-18 ENCOUNTER — Encounter (HOSPITAL_COMMUNITY): Payer: Medicare HMO

## 2018-12-20 ENCOUNTER — Other Ambulatory Visit: Payer: Self-pay

## 2018-12-20 ENCOUNTER — Inpatient Hospital Stay (HOSPITAL_COMMUNITY): Payer: Medicare HMO

## 2018-12-20 DIAGNOSIS — D751 Secondary polycythemia: Secondary | ICD-10-CM | POA: Diagnosis not present

## 2018-12-20 NOTE — Patient Instructions (Signed)
[place] at [facility]  Discharge Instructions:  You had a therapeutic phlebotomy today.  Therapeutic Phlebotomy Therapeutic phlebotomy is the planned removal of blood from a person's body for the purpose of treating a medical condition. The procedure is similar to donating blood. Usually, about a pint (470 mL, or 0.47 L) of blood is removed. The average adult has 9-12 pints (4.3-5.7 L) of blood in the body. Therapeutic phlebotomy may be used to treat the following medical conditions:  Hemochromatosis. This is a condition in which the blood contains too much iron.  Polycythemia vera. This is a condition in which the blood contains too many red blood cells.  Porphyria cutanea tarda. This is a disease in which an important part of hemoglobin is not made properly. It results in the buildup of abnormal amounts of porphyrins in the body.  Sickle cell disease. This is a condition in which the red blood cells form an abnormal crescent shape rather than a round shape. Tell a health care provider about:  Any allergies you have.  All medicines you are taking, including vitamins, herbs, eye drops, creams, and over-the-counter medicines.  Any problems you or family members have had with anesthetic medicines.  Any blood disorders you have.  Any surgeries you have had.  Any medical conditions you have.  Whether you are pregnant or may be pregnant. What are the risks? Generally, this is a safe procedure. However, problems may occur, including:  Nausea or light-headedness.  Low blood pressure (hypotension).  Soreness, bleeding, swelling, or bruising at the needle insertion site.  Infection. What happens before the procedure?  Follow instructions from your health care provider about eating or drinking restrictions.  Ask your health care provider about: ? Changing or stopping your regular medicines. This is especially important if you are taking diabetes  medicines or blood thinners (anticoagulants). ? Taking medicines such as aspirin and ibuprofen. These medicines can thin your blood. Do not take these medicines unless your health care provider tells you to take them. ? Taking over-the-counter medicines, vitamins, herbs, and supplements.  Wear clothing with sleeves that can be raised above the elbow.  Plan to have someone take you home from the hospital or clinic.  You may have a blood sample taken.  Your blood pressure, pulse rate, and breathing rate will be measured. What happens during the procedure?   To lower your risk of infection: ? Your health care team will wash or sanitize their hands. ? Your skin will be cleaned with an antiseptic.  You may be given a medicine to numb the area (local anesthetic).  A tourniquet will be placed on your arm.  A needle will be inserted into one of your veins.  Tubing and a collection bag will be attached to that needle.  Blood will flow through the needle and tubing into the collection bag.  The collection bag will be placed lower than your arm to allow gravity to help the flow of blood into the bag.  You may be asked to open and close your hand slowly and continually during the entire collection.  After the specified amount of blood has been removed from your body, the collection bag and tubing will be clamped.  The needle will be removed from your vein.  Pressure will be held on the site of the needle insertion to stop the bleeding.  A bandage (dressing) will be placed over the needle insertion site. The procedure may vary among health care  providers and hospitals. What happens after the procedure?  Your blood pressure, pulse rate, and breathing rate will be measured after the procedure.  You will be encouraged to drink fluids.  Your recovery will be assessed and monitored.  You can return to your normal activities as told by your health care provider. Summary  Therapeutic  phlebotomy is the planned removal of blood from a person's body for the purpose of treating a medical condition.  Therapeutic phlebotomy may be used to treat hemochromatosis, polycythemia vera, porphyria cutanea tarda, or sickle cell disease.  In the procedure, a needle is inserted and about a pint (470 mL, or 0.47 L) of blood is removed. The average adult has 9-12 pints (4.3-5.7 L) of blood in the body.  This is generally a safe procedure, but it can sometimes cause problems such as nausea, light-headedness, or low blood pressure (hypotension). This information is not intended to replace advice given to you by your health care provider. Make sure you discuss any questions you have with your health care provider. Document Released: 09/05/2010 Document Revised: 04/19/2017 Document Reviewed: 04/19/2017 Elsevier Patient Education  2020 [employer] After This sheet gives you information about how to care for yourself after your procedure. Your health care provider may also give you more specific instructions. If you have problems or questions, contact your health care provider. What can I expect after the procedure? After the procedure, it is common to have:  Light-headedness or dizziness. You may feel faint.  Nausea.  Tiredness (fatigue). Follow these instructions at home: Eating and drinking  Be sure to eat well-balanced meals for the next 24 hours.  Drink enough fluid to keep your urine pale yellow.  Avoid drinking alcohol on the day that you had the procedure. Activity   Return to your normal activities as told by your health care provider. Most people can go back to their normal activities right away.  Avoid activities that take a lot of effort for about 5 hours after the procedure. Athletes should avoid strenuous exercise for at least 12 hours.  Avoid heavy lifting or pulling for about 5 hours after the procedure. Do not lift anything that is  heavier than 10 lb (4.5 kg).  Change positions slowly for the remainder of the day. This will help to prevent light-headedness or fainting.  If you feel light-headed, lie down until the feeling goes away. Needle insertion site care   Keep your bandage (dressing) dry. You can remove the bandage after about 5 hours or as told by your health care provider.  If you have bleeding from the needle insertion site, raise (elevate) your arm and press firmly on the site until the bleeding stops.  If you have bruising at the site, apply ice to the area: ? Remove the dressing. ? Put ice in a plastic bag. ? Place a towel between your skin and the bag. ? Leave the ice on for 20 minutes, 2-3 times a day for the first 24 hours.  If the swelling does not go away after 24 hours, apply a warm, moist cloth (warm compress) to the area for 20 minutes, 2-3 times a day. General instructions  Do not use any products that contain nicotine or tobacco, such as cigarettes and e-cigarettes, for at least 30 minutes after the procedure.  Keep all follow-up visits as told by your health care provider. This is important. You may need to continue having regular therapeutic phlebotomy treatments as  directed. Contact a health care provider if you:  Have redness, swelling, or pain at the needle insertion site.  Have fluid or blood coming from the needle insertion site.  Have pus or a bad smell coming from the needle insertion site.  Notice that the needle insertion site feels warm to the touch.  Feel light-headed, dizzy, or nauseous, and the feeling does not go away.  Have new bruising at the needle insertion site.  Feel weaker than normal.  Have a fever or chills. Get help right away if:  You faint.  You have chest pain.  You have trouble breathing.  You have severe nausea or vomiting. Summary  After the procedure, it is common to have some light-headedness, dizziness, nausea, or tiredness (fatigue).   Be sure to eat well-balanced meals for the next 24 hours. Drink enough fluid to keep your urine pale yellow.  Return to your normal activities as told by your health care provider.  Keep all follow-up visits as told by your health care provider. You may need to continue having regular therapeutic phlebotomy treatments as directed. This information is not intended to replace advice given to you by your health care provider. Make sure you discuss any questions you have with your health care provider. Document Released: 09/05/2010 Document Revised: 04/20/2017 Document Reviewed: 04/19/2017 Elsevier Patient Education  2020 [employer].  _______________________________________________________________  Thank you for choosing [place] at [facility] to provide your oncology and hematology care.  To afford each patient quality time with our providers, please arrive at least 15 minutes before your scheduled appointment.  You need to re-schedule your appointment if you arrive 10 or more minutes late.  We strive to give you quality time with our providers, and arriving late affects you and other patients whose appointments are after yours.  Also, if you no show three or more times for appointments you may be dismissed from the clinic.  Again, thank you for choosing [place] at [place] hope is that these requests will allow you access to exceptional care and in a timely manner. _______________________________________________________________  If you have questions after your visit, please contact our office at [phone] between the hours of 8:30 a.m. and 5:00 p.m. Voicemails left after 4:30 p.m. will not be returned until the following business day. _______________________________________________________________  For prescription refill requests, have your pharmacy contact our office.  _______________________________________________________________  Recommendations made by the consultant and any test results will be sent to your referring physician. _______________________________________________________________

## 2018-12-20 NOTE — Progress Notes (Signed)
Brian Parsons presents today for phlebotomy per MD orders. Phlebotomy procedure started at 1048 and ended at 1058. 500cc removed. Patient tolerated procedure well. IV needle removed intact. VSS, pt discharged in satisfactory condition with no complaints.

## 2018-12-30 ENCOUNTER — Ambulatory Visit: Payer: Self-pay | Admitting: "Endocrinology

## 2019-01-01 ENCOUNTER — Other Ambulatory Visit: Payer: Self-pay

## 2019-01-01 ENCOUNTER — Ambulatory Visit (HOSPITAL_COMMUNITY)
Admission: RE | Admit: 2019-01-01 | Discharge: 2019-01-01 | Disposition: A | Discharge status: Home / Self Care | Payer: Medicare HMO | Source: Ambulatory Visit | Attending: Hematology | Admitting: Hematology

## 2019-01-01 DIAGNOSIS — I2510 Atherosclerotic heart disease of native coronary artery without angina pectoris: Secondary | ICD-10-CM | POA: Diagnosis not present

## 2019-01-01 DIAGNOSIS — J439 Emphysema, unspecified: Secondary | ICD-10-CM | POA: Insufficient documentation

## 2019-01-01 DIAGNOSIS — Z720 Tobacco use: Secondary | ICD-10-CM

## 2019-01-01 DIAGNOSIS — F17210 Nicotine dependence, cigarettes, uncomplicated: Secondary | ICD-10-CM | POA: Diagnosis not present

## 2019-01-01 DIAGNOSIS — Z122 Encounter for screening for malignant neoplasm of respiratory organs: Secondary | ICD-10-CM | POA: Insufficient documentation

## 2019-01-07 DIAGNOSIS — N183 Chronic kidney disease, stage 3 unspecified: Secondary | ICD-10-CM | POA: Insufficient documentation

## 2019-01-07 DIAGNOSIS — I129 Hypertensive chronic kidney disease with stage 1 through stage 4 chronic kidney disease, or unspecified chronic kidney disease: Secondary | ICD-10-CM | POA: Insufficient documentation

## 2019-01-14 ENCOUNTER — Encounter (HOSPITAL_COMMUNITY): Payer: Self-pay | Admitting: Hematology

## 2019-01-14 ENCOUNTER — Inpatient Hospital Stay (HOSPITAL_COMMUNITY): Payer: Medicare HMO

## 2019-01-14 ENCOUNTER — Inpatient Hospital Stay (HOSPITAL_BASED_OUTPATIENT_CLINIC_OR_DEPARTMENT_OTHER): Payer: Medicare HMO | Admitting: Hematology

## 2019-01-14 ENCOUNTER — Other Ambulatory Visit: Payer: Self-pay

## 2019-01-14 DIAGNOSIS — D751 Secondary polycythemia: Secondary | ICD-10-CM | POA: Diagnosis not present

## 2019-01-14 LAB — COMPREHENSIVE METABOLIC PANEL
ALT: 16 U/L (ref 0–44)
AST: 16 U/L (ref 15–41)
Albumin: 4 g/dL (ref 3.5–5.0)
Alkaline Phosphatase: 90 U/L (ref 38–126)
Anion gap: 13 (ref 5–15)
BUN: 29 mg/dL — ABNORMAL HIGH (ref 8–23)
CO2: 44 mmol/L — ABNORMAL HIGH (ref 22–32)
Calcium: 9.8 mg/dL (ref 8.9–10.3)
Chloride: 83 mmol/L — ABNORMAL LOW (ref 98–111)
Creatinine, Ser: 1.82 mg/dL — ABNORMAL HIGH (ref 0.61–1.24)
GFR calc Af Amer: 43 mL/min — ABNORMAL LOW (ref >60)
GFR calc non Af Amer: 37 mL/min — ABNORMAL LOW (ref >60)
Glucose, Bld: 142 mg/dL — ABNORMAL HIGH (ref 70–99)
Potassium: 3.8 mmol/L (ref 3.5–5.1)
Sodium: 140 mmol/L (ref 135–145)
Total Bilirubin: 0.9 mg/dL (ref 0.3–1.2)
Total Protein: 7 g/dL (ref 6.5–8.1)

## 2019-01-14 LAB — CBC WITH DIFFERENTIAL/PLATELET
Abs Immature Granulocytes: 0.06 10*3/uL (ref 0.00–0.07)
Basophils Absolute: 0.1 10*3/uL (ref 0.0–0.1)
Basophils Relative: 1 %
Eosinophils Absolute: 0.3 10*3/uL (ref 0.0–0.5)
Eosinophils Relative: 2 %
HCT: 61.2 % — ABNORMAL HIGH (ref 39.0–52.0)
Hemoglobin: 19 g/dL — ABNORMAL HIGH (ref 13.0–17.0)
Immature Granulocytes: 1 %
Lymphocytes Relative: 22 %
Lymphs Abs: 2.4 10*3/uL (ref 0.7–4.0)
MCH: 30.4 pg (ref 26.0–34.0)
MCHC: 31 g/dL (ref 30.0–36.0)
MCV: 97.8 fL (ref 80.0–100.0)
Monocytes Absolute: 0.9 10*3/uL (ref 0.1–1.0)
Monocytes Relative: 8 %
Neutro Abs: 7.3 10*3/uL (ref 1.7–7.7)
Neutrophils Relative %: 66 %
Platelets: 153 10*3/uL (ref 150–400)
RBC: 6.26 MIL/uL — ABNORMAL HIGH (ref 4.22–5.81)
RDW: 17.7 % — ABNORMAL HIGH (ref 11.5–15.5)
WBC: 11 10*3/uL — ABNORMAL HIGH (ref 4.0–10.5)
nRBC: 0 % (ref 0.0–0.2)

## 2019-01-14 LAB — LACTATE DEHYDROGENASE: LDH: 154 U/L (ref 98–192)

## 2019-01-14 NOTE — Progress Notes (Addendum)
Phlebotomy started @ 1256. IV in Right AC. Site WNL.  Phlebotomy ended @ 1303. Patient tolerated well. IV site wnl.  DRSG dry and intact.  586ml TPR completed.

## 2019-02-17 ENCOUNTER — Other Ambulatory Visit (HOSPITAL_COMMUNITY): Payer: Self-pay

## 2019-02-17 DIAGNOSIS — D45 Polycythemia vera: Secondary | ICD-10-CM

## 2019-02-17 DIAGNOSIS — Z720 Tobacco use: Secondary | ICD-10-CM

## 2019-02-17 DIAGNOSIS — D751 Secondary polycythemia: Secondary | ICD-10-CM

## 2019-02-18 ENCOUNTER — Other Ambulatory Visit: Payer: Self-pay

## 2019-02-18 ENCOUNTER — Inpatient Hospital Stay (HOSPITAL_COMMUNITY): Payer: Medicare HMO | Attending: Hematology

## 2019-02-18 ENCOUNTER — Inpatient Hospital Stay (HOSPITAL_BASED_OUTPATIENT_CLINIC_OR_DEPARTMENT_OTHER): Payer: Medicare HMO | Admitting: Hematology

## 2019-02-18 ENCOUNTER — Inpatient Hospital Stay (HOSPITAL_COMMUNITY): Payer: Medicare HMO

## 2019-02-18 DIAGNOSIS — R0902 Hypoxemia: Secondary | ICD-10-CM | POA: Diagnosis not present

## 2019-02-18 DIAGNOSIS — D751 Secondary polycythemia: Secondary | ICD-10-CM

## 2019-02-18 DIAGNOSIS — F17210 Nicotine dependence, cigarettes, uncomplicated: Secondary | ICD-10-CM | POA: Insufficient documentation

## 2019-02-18 DIAGNOSIS — D45 Polycythemia vera: Secondary | ICD-10-CM

## 2019-02-18 DIAGNOSIS — J449 Chronic obstructive pulmonary disease, unspecified: Secondary | ICD-10-CM | POA: Insufficient documentation

## 2019-02-18 DIAGNOSIS — Z720 Tobacco use: Secondary | ICD-10-CM

## 2019-02-18 LAB — CBC WITH DIFFERENTIAL/PLATELET
Abs Immature Granulocytes: 0.09 10*3/uL — ABNORMAL HIGH (ref 0.00–0.07)
Basophils Absolute: 0.1 10*3/uL (ref 0.0–0.1)
Basophils Relative: 1 %
Eosinophils Absolute: 0.3 10*3/uL (ref 0.0–0.5)
Eosinophils Relative: 2 %
HCT: 62.9 % — ABNORMAL HIGH (ref 39.0–52.0)
Hemoglobin: 19.1 g/dL — ABNORMAL HIGH (ref 13.0–17.0)
Immature Granulocytes: 1 %
Lymphocytes Relative: 21 %
Lymphs Abs: 3.2 10*3/uL (ref 0.7–4.0)
MCH: 28.7 pg (ref 26.0–34.0)
MCHC: 30.4 g/dL (ref 30.0–36.0)
MCV: 94.6 fL (ref 80.0–100.0)
Monocytes Absolute: 1.2 10*3/uL — ABNORMAL HIGH (ref 0.1–1.0)
Monocytes Relative: 8 %
Neutro Abs: 10.1 10*3/uL — ABNORMAL HIGH (ref 1.7–7.7)
Neutrophils Relative %: 67 %
Platelets: 194 10*3/uL (ref 150–400)
RBC: 6.65 MIL/uL — ABNORMAL HIGH (ref 4.22–5.81)
RDW: 17.1 % — ABNORMAL HIGH (ref 11.5–15.5)
WBC: 14.9 10*3/uL — ABNORMAL HIGH (ref 4.0–10.5)
nRBC: 0 % (ref 0.0–0.2)

## 2019-02-18 LAB — COMPREHENSIVE METABOLIC PANEL
ALT: 17 U/L (ref 0–44)
AST: 17 U/L (ref 15–41)
Albumin: 4.1 g/dL (ref 3.5–5.0)
Alkaline Phosphatase: 103 U/L (ref 38–126)
Anion gap: 13 (ref 5–15)
BUN: 49 mg/dL — ABNORMAL HIGH (ref 8–23)
CO2: 44 mmol/L — ABNORMAL HIGH (ref 22–32)
Calcium: 9.6 mg/dL (ref 8.9–10.3)
Chloride: 82 mmol/L — ABNORMAL LOW (ref 98–111)
Creatinine, Ser: 1.7 mg/dL — ABNORMAL HIGH (ref 0.61–1.24)
GFR calc Af Amer: 46 mL/min — ABNORMAL LOW (ref >60)
GFR calc non Af Amer: 40 mL/min — ABNORMAL LOW (ref >60)
Glucose, Bld: 117 mg/dL — ABNORMAL HIGH (ref 70–99)
Potassium: 3.5 mmol/L (ref 3.5–5.1)
Sodium: 139 mmol/L (ref 135–145)
Total Bilirubin: 0.8 mg/dL (ref 0.3–1.2)
Total Protein: 7.2 g/dL (ref 6.5–8.1)

## 2019-02-18 LAB — LACTATE DEHYDROGENASE: LDH: 147 U/L (ref 98–192)

## 2019-02-18 NOTE — Progress Notes (Signed)
Steave Latronica presents today for phlebotomy per MD orders. Phlebotomy procedure started at 1346and ended at 1356 500 grams removed. Patient observed for 15 minutes after procedure without any incident. Patient tolerated procedure well. IV needle removed intact. Vitals stable and discharged home from clinic ambulatory. Follow up as scheduled.

## 2019-02-18 NOTE — Progress Notes (Signed)
Brian Parsons,  [id number]   CLINIC:  Medical Oncology/Hematology  PCP:  [name], MD [address] [place] [id number] [phone]   REASON FOR VISIT:  Follow-up for secondary polycythemia  CURRENT THERAPY: Periodic phlebotomies    INTERVAL HISTORY:  Brian Parsons 68 y.o. male presents today for follow-up.  He reports overall doing well.  He denies any significant fatigue.  He states monthly phlebotomies have actually increased his energy levels.  He states he is feeling better.  He continues to smoke a pack cigarettes a day.  He reports shortness of breath with exertion that does resolve with rest.  He is here for repeat labs and review of most recent CT chest scan.  REVIEW OF SYSTEMS:  Review of Systems  Constitutional: Positive for fatigue.  HENT:  Negative.   Eyes: Negative.   Respiratory: Positive for cough and shortness of breath.   Cardiovascular: Negative.   Gastrointestinal: Negative.   Endocrine: Negative.   Genitourinary: Negative.    Musculoskeletal: Positive for arthralgias and myalgias.  Skin: Negative.   Neurological: Positive for extremity weakness and light-headedness.  Hematological: Negative.   Psychiatric/Behavioral: Negative.      PAST MEDICAL/SURGICAL HISTORY:  Past Medical History:  Diagnosis Date  . Bilateral leg edema   . CHF (congestive heart failure) ([place])   . COPD (chronic obstructive pulmonary disease) ([place])   . Coronary atherosclerosis    Nonobstructive at cardiac catheterization April 2017  . Essential hypertension   . On home O2    2L N/C prn  . Polycythemia    Past Surgical History:  Procedure Laterality Date  . CARDIAC CATHETERIZATION N/A 08/10/2015   Procedure: Left Heart Cath and Coronary Angiography;  Surgeon: Troy Sine, MD;  Location: [facility] [facility];  Service: Cardiovascular;  Laterality: N/A;     SOCIAL HISTORY:  Social History   Socioeconomic History  .  Marital status: Divorced    Spouse name: Not on file  . Number of children: Not on file  . Years of education: Not on file  . Highest education level: Not on file  Occupational History  . Not on file  Social Needs  . Financial resource strain: Not on file  . Food insecurity    Worry: Not on file    Inability: Not on file  . Transportation needs    Medical: Not on file    Non-medical: Not on file  Tobacco Use  . Smoking status: Current Every Day Smoker    Packs/day: 0.50    Years: 50.00    Pack years: 25.00    Types: Cigarettes    Start date: 04/17/1961  . Smokeless tobacco: Never Used  Substance and Sexual Activity  . Alcohol use: No    Alcohol/week: 0.0 standard drinks    Comment: No EtOH for 7 years  . Drug use: No  . Sexual activity: Yes  Lifestyle  . Physical activity    Days per week: Not on file    Minutes per session: Not on file  . Stress: Not on file  Relationships  . Social Herbalist on phone: Not on file    Gets together: Not on file    Attends religious service: Not on file    Active member of club or organization: Not on file    Attends meetings of clubs or organizations: Not on file    Relationship status: Not on file  .  Intimate partner violence    Fear of current or ex partner: Not on file    Emotionally abused: Not on file    Physically abused: Not on file    Forced sexual activity: Not on file  Other Topics Concern  . Not on file  Social History Narrative  . Not on file    FAMILY HISTORY:  Family History  Problem Relation Age of Onset  . Stroke Mother   . Emphysema Father     CURRENT MEDICATIONS:  Outpatient Encounter Medications as of 02/18/2019  Medication Sig  . allopurinol (ZYLOPRIM) 100 MG tablet Take 200 mg by mouth 2 (two) times daily.   Marland Kitchen aspirin EC 81 MG tablet Take 81 mg by mouth daily.  Marland Kitchen atorvastatin (LIPITOR) 20 MG tablet Take 20 mg by mouth daily at 6 PM.   . carvedilol (COREG) 3.125 MG tablet Take 1 tablet  (3.125 mg total) by mouth 2 (two) times daily with a meal.  . Fluticasone-Umeclidin-Vilant (TRELEGY ELLIPTA) 100-62.5-25 MCG/INH AEPB Inhale 1 puff into the lungs daily.  Marland Kitchen ipratropium-albuterol (DUONEB) 0.5-2.5 (3) MG/3ML SOLN 3 mLs every 6 (six) hours as needed.   Marland Kitchen JANUVIA 100 MG tablet Take 100 mg by mouth daily.   . Magnesium Gluconate 500 (27 Mg) MG TABS Take by mouth.  . metolazone (ZAROXOLYN) 2.5 MG tablet Take 2.5 mg by mouth every other day.   . nicotine (NICODERM CQ) 21 mg/24hr patch Place 1 patch (21 mg total) onto the skin daily.  . potassium chloride (MICRO-K) 10 MEQ CR capsule Take 1 capsule by mouth daily.  Marland Kitchen torsemide (DEMADEX) 20 MG tablet Take 80 mg by mouth 2 (two) times daily.   . vitamin B-12 (CYANOCOBALAMIN) 1000 MCG tablet Take 1,000 mcg by mouth daily.  Marland Kitchen albuterol (PROVENTIL) (2.5 MG/3ML) 0.083% nebulizer solution Take 3 mLs (2.5 mg total) by nebulization every 4 (four) hours as needed for wheezing or shortness of breath. (Patient not taking: Reported on 02/18/2019)   No facility-administered encounter medications on file as of 02/18/2019.     ALLERGIES:  Allergies  Allergen Reactions  . Amoxicillin Hives  . Penicillins Hives    Has patient had a PCN reaction causing immediate rash, facial/tongue/throat swelling, SOB or lightheadedness with hypotension: Unknown Has patient had a PCN reaction causing severe rash involving mucus membranes or skin necrosis: No Has patient had a PCN reaction that required hospitalization No Has patient had a PCN reaction occurring within the last 10 years: No If all of the above answers are "NO", then may proceed with Cephalosporin use.      PHYSICAL EXAM:  ECOG Performance status: 1  Vitals:   02/18/19 1311  BP: (!) 103/56  Pulse: 81  Resp: 18  Temp: 97.7 F (36.5 C)  SpO2: 93%   Filed Weights   02/18/19 1311  Weight: 229 lb (103.9 kg)    Physical Exam Constitutional:      Appearance: Normal appearance. He is  obese.  HENT:     Head: Normocephalic.     Right Ear: External ear normal.     Left Ear: External ear normal.     Nose: Nose normal.     Mouth/Throat:     Pharynx: Oropharynx is clear.  Eyes:     Conjunctiva/sclera: Conjunctivae normal.  Neck:     Musculoskeletal: Normal range of motion.  Cardiovascular:     Rate and Rhythm: Normal rate and regular rhythm.     Pulses: Normal pulses.  Heart sounds: Normal heart sounds.  Pulmonary:     Effort: Pulmonary effort is normal.     Breath sounds: Wheezing and rhonchi present.  Abdominal:     General: Bowel sounds are normal.  Musculoskeletal: Normal range of motion.  Skin:    General: Skin is warm.  Neurological:     General: No focal deficit present.     Mental Status: He is alert and oriented to person, place, and time.  Psychiatric:        Mood and Affect: Mood normal.        Behavior: Behavior normal.        Thought Content: Thought content normal.        Judgment: Judgment normal.      LABORATORY DATA:  I have reviewed the labs as listed.  CBC    Component Value Date/Time   WBC 14.9 (H) 02/18/2019 1153   RBC 6.65 (H) 02/18/2019 1153   HGB 19.1 (H) 02/18/2019 1153   HCT 62.9 (H) 02/18/2019 1153   PLT 194 02/18/2019 1153   MCV 94.6 02/18/2019 1153   MCH 28.7 02/18/2019 1153   MCHC 30.4 02/18/2019 1153   RDW 17.1 (H) 02/18/2019 1153   LYMPHSABS 3.2 02/18/2019 1153   MONOABS 1.2 (H) 02/18/2019 1153   EOSABS 0.3 02/18/2019 1153   BASOSABS 0.1 02/18/2019 1153   CMP Latest Ref Rng & Units 02/18/2019 01/14/2019 12/17/2018  Glucose 70 - 99 mg/dL 117(H) 142(H) 173(H)  BUN 8 - 23 mg/dL 49(H) 29(H) 43(H)  Creatinine 0.61 - 1.24 mg/dL 1.70(H) 1.82(H) 1.55(H)  Sodium 135 - 145 mmol/L 139 140 140  Potassium 3.5 - 5.1 mmol/L 3.5 3.8 4.2  Chloride 98 - 111 mmol/L 82(L) 83(L) 82(L)  CO2 22 - 32 mmol/L 44(H) 44(H) 44(H)  Calcium 8.9 - 10.3 mg/dL 9.6 9.8 9.9  Total Protein 6.5 - 8.1 g/dL 7.2 7.0 7.2  Total Bilirubin 0.3 - 1.2  mg/dL 0.8 0.9 1.1  Alkaline Phos 38 - 126 U/L 103 90 95  AST 15 - 41 U/L 17 16 22   ALT 0 - 44 U/L 17 16 26           ASSESSMENT & PLAN:   Polycythemia, secondary 1. 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   - JAK/MPL/CALR mutations are negative. - Receives periodic phlebotomies -Blood work has improved hemoglobin now 19.1, hematocrit 62.9.  Recommend patient continue with monthly phlebotomies to maintain hematocrit of 50 or less.  Recommend patient continue on aspirin 81 mg.   -He will return to clinic monthly for phlebotomies and in 3 months for provider visit.    2.  Tobacco abuse -Patient has a 57-year 1 pack/day smoking history.  -Low-dose CT lung cancer screening from 01/01/2019 did not reveal any concerns for malignancy.  Emphysema was noted.         [place] [phone]

## 2019-02-18 NOTE — Assessment & Plan Note (Signed)
1. 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   - JAK/MPL/CALR mutations are negative. - Receives periodic phlebotomies -Blood work has improved hemoglobin now 19.1, hematocrit 62.9.  Recommend patient continue with monthly phlebotomies to maintain hematocrit of 50 or less.  Recommend patient continue on aspirin 81 mg.   -He will return to clinic monthly for phlebotomies and in 3 months for provider visit.    2.  Tobacco abuse -Patient has a 57-year 1 pack/day smoking history.  -Low-dose CT lung cancer screening from 01/01/2019 did not reveal any concerns for malignancy.  Emphysema was noted.

## 2019-02-24 ENCOUNTER — Encounter (HOSPITAL_COMMUNITY): Payer: Medicare HMO

## 2019-03-05 NOTE — Progress Notes (Signed)
[place], [place] [address]   CLINIC:  Medical Oncology/Hematology  PCP:  [name], MD [address] [place] [address] [phone]   REASON FOR VISIT:  Follow-up for Secondary Polycythemia   CURRENT THERAPY: Periodic phlebotomies     INTERVAL HISTORY:  Brian Parsons 68 y.o. male presents today for follow-up.Marland Kitchen  He has received 1 phlebotomy.  He states he felt better after phlebotomy.  He states he felt like he had more energy.  And his breathing improved.  Today he presents for repeat labs and office visit.  He does continue to smoke but again he is actively trying to stop smoking.   REVIEW OF SYSTEMS:  Review of Systems  Constitutional: Positive for fatigue.  HENT:  Negative.   Eyes: Negative.   Respiratory: Positive for cough.   Cardiovascular: Negative.   Gastrointestinal: Negative.   Endocrine: Negative.   Genitourinary: Negative.    Musculoskeletal: Positive for arthralgias, back pain and myalgias.  Skin: Negative.   Neurological: Positive for extremity weakness.  Hematological: Negative.   Psychiatric/Behavioral: Negative.      PAST MEDICAL/SURGICAL HISTORY:  Past Medical History:  Diagnosis Date  . Bilateral leg edema   . CHF (congestive heart failure) ([facility])   . COPD (chronic obstructive pulmonary disease) ([facility])   . Coronary atherosclerosis    Nonobstructive at cardiac catheterization April 2017  . Essential hypertension   . On home O2    2L N/C prn  . Polycythemia    Past Surgical History:  Procedure Laterality Date  . CARDIAC CATHETERIZATION N/A 08/10/2015   Procedure: Left Heart Cath and Coronary Angiography;  Surgeon: Troy Sine, MD;  Location: [facility];  Service: Cardiovascular;  Laterality: N/A;     SOCIAL HISTORY:  Social History   Socioeconomic History  . Marital status: Divorced    Spouse name: Not on file  . Number of children: Not on file  . Years of education: Not on  file  . Highest education level: Not on file  Occupational History  . Not on file  Social Needs  . Financial resource strain: Not on file  . Food insecurity    Worry: Not on file    Inability: Not on file  . Transportation needs    Medical: Not on file    Non-medical: Not on file  Tobacco Use  . Smoking status: Current Every Day Smoker    Packs/day: 0.50    Years: 50.00    Pack years: 25.00    Types: Cigarettes    Start date: 04/17/1961  . Smokeless tobacco: Never Used  Substance and Sexual Activity  . Alcohol use: No    Alcohol/week: 0.0 standard drinks    Comment: No EtOH for 7 years  . Drug use: No  . Sexual activity: Yes  Lifestyle  . Physical activity    Days per week: Not on file    Minutes per session: Not on file  . Stress: Not on file  Relationships  . Social Herbalist on phone: Not on file    Gets together: Not on file    Attends religious service: Not on file    Active member of club or organization: Not on file    Attends meetings of clubs or organizations: Not on file    Relationship status: Not on file  . Intimate partner violence    Fear of current or ex partner: Not on  file    Emotionally abused: Not on file    Physically abused: Not on file    Forced sexual activity: Not on file  Other Topics Concern  . Not on file  Social History Narrative  . Not on file    FAMILY HISTORY:  Family History  Problem Relation Age of Onset  . Stroke Mother   . Emphysema Father     CURRENT MEDICATIONS:  Outpatient Encounter Medications as of 01/14/2019  Medication Sig  . albuterol (PROVENTIL) (2.5 MG/3ML) 0.083% nebulizer solution Take 3 mLs (2.5 mg total) by nebulization every 4 (four) hours as needed for wheezing or shortness of breath. (Patient not taking: Reported on 02/18/2019)  . allopurinol (ZYLOPRIM) 100 MG tablet Take 200 mg by mouth 2 (two) times daily.   Marland Kitchen aspirin EC 81 MG tablet Take 81 mg by mouth daily.  Marland Kitchen atorvastatin (LIPITOR) 20 MG  tablet Take 20 mg by mouth daily at 6 PM.   . carvedilol (COREG) 3.125 MG tablet Take 1 tablet (3.125 mg total) by mouth 2 (two) times daily with a meal.  . Fluticasone-Umeclidin-Vilant (TRELEGY ELLIPTA) 100-62.5-25 MCG/INH AEPB Inhale 1 puff into the lungs daily.  . Magnesium Gluconate 500 (27 Mg) MG TABS Take by mouth.  . metolazone (ZAROXOLYN) 2.5 MG tablet Take 2.5 mg by mouth every other day.   . potassium chloride (MICRO-K) 10 MEQ CR capsule Take 1 capsule by mouth daily.  Marland Kitchen torsemide (DEMADEX) 20 MG tablet Take 80 mg by mouth 2 (two) times daily.   . vitamin B-12 (CYANOCOBALAMIN) 1000 MCG tablet Take 1,000 mcg by mouth daily.  . nicotine (NICODERM CQ) 21 mg/24hr patch Place 1 patch (21 mg total) onto the skin daily.   No facility-administered encounter medications on file as of 01/14/2019.     ALLERGIES:  Allergies  Allergen Reactions  . Amoxicillin Hives  . Penicillins Hives    Has patient had a PCN reaction causing immediate rash, facial/tongue/throat swelling, SOB or lightheadedness with hypotension: Unknown Has patient had a PCN reaction causing severe rash involving mucus membranes or skin necrosis: No Has patient had a PCN reaction that required hospitalization No Has patient had a PCN reaction occurring within the last 10 years: No If all of the above answers are "NO", then may proceed with Cephalosporin use.      PHYSICAL EXAM:  ECOG Performance status: 1  Vitals:   01/14/19 1200  BP: 109/67  Pulse: 95  Resp: 20  Temp: 97.8 F (36.6 C)  SpO2: (!) 74%   Filed Weights   01/14/19 1200  Weight: 241 lb 8 oz (109.5 kg)    Physical Exam Constitutional:      Appearance: Normal appearance.  HENT:     Head: Normocephalic.     Right Ear: External ear normal.     Left Ear: External ear normal.     Nose: Nose normal.     Mouth/Throat:     Pharynx: Oropharynx is clear.  Eyes:     Conjunctiva/sclera: Conjunctivae normal.  Neck:     Musculoskeletal: Normal range  of motion.  Cardiovascular:     Rate and Rhythm: Normal rate and regular rhythm.     Pulses: Normal pulses.  Pulmonary:     Effort: Pulmonary effort is normal.     Breath sounds: Rhonchi present.     Comments: Diminished breath sounds all lobes  Musculoskeletal: Normal range of motion.     Comments: Decreased ROM   Skin:  General: Skin is warm.  Neurological:     General: No focal deficit present.     Mental Status: He is alert and oriented to person, place, and time.  Psychiatric:        Mood and Affect: Mood normal.        Thought Content: Thought content normal.        Judgment: Judgment normal.      LABORATORY DATA:  I have reviewed the labs as listed.  CBC    Component Value Date/Time   WBC 14.9 (H) 02/18/2019 1153   RBC 6.65 (H) 02/18/2019 1153   HGB 19.1 (H) 02/18/2019 1153   HCT 62.9 (H) 02/18/2019 1153   PLT 194 02/18/2019 1153   MCV 94.6 02/18/2019 1153   MCH 28.7 02/18/2019 1153   MCHC 30.4 02/18/2019 1153   RDW 17.1 (H) 02/18/2019 1153   LYMPHSABS 3.2 02/18/2019 1153   MONOABS 1.2 (H) 02/18/2019 1153   EOSABS 0.3 02/18/2019 1153   BASOSABS 0.1 02/18/2019 1153   CMP Latest Ref Rng & Units 02/18/2019 01/14/2019 12/17/2018  Glucose 70 - 99 mg/dL 117(H) 142(H) 173(H)  BUN 8 - 23 mg/dL 49(H) 29(H) 43(H)  Creatinine 0.61 - 1.24 mg/dL 1.70(H) 1.82(H) 1.55(H)  Sodium 135 - 145 mmol/L 139 140 140  Potassium 3.5 - 5.1 mmol/L 3.5 3.8 4.2  Chloride 98 - 111 mmol/L 82(L) 83(L) 82(L)  CO2 22 - 32 mmol/L 44(H) 44(H) 44(H)  Calcium 8.9 - 10.3 mg/dL 9.6 9.8 9.9  Total Protein 6.5 - 8.1 g/dL 7.2 7.0 7.2  Total Bilirubin 0.3 - 1.2 mg/dL 0.8 0.9 1.1  Alkaline Phos 38 - 126 U/L 103 90 95  AST 15 - 41 U/L 17 16 22   ALT 0 - 44 U/L 17 16 26           ASSESSMENT & PLAN:   Polycythemia, secondary 1. 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   - JAK/MPL/CALR mutations are negative. - Receives periodic phlebotomies -Received phlebotomy  on 12/17/2018 for a hemoglobin of 20.9 and hematocrit of 65.8.   - Advised patient to restart aspirin 81 mg daily.   - Goal is to maintain hematocrit of 50 or less. -Hemoglobin today remains elevated at 19.0, hematocrit 61.2 recommend patient proceed with phlebotomy.  We will schedule monthly phlebotomies for patient.  Patient states he does feel better after phlebotomies with decreased shortness of breath. - Return to clinic in 1 month  2.  Tobacco abuse -Patient has a 57-year 1 pack/day smoking history.  Have recommended patient for a low-dose CT lung cancer screening. -Low-dose CT lung cancer screening was completed on January 01, 2019 impression with lung RADS category 1.  Recommendation is to continue with annual CT chest.  Also of note aortic atherosclerosis and emphysema appreciated.          [facility] [phone]

## 2019-03-06 ENCOUNTER — Ambulatory Visit (INDEPENDENT_AMBULATORY_CARE_PROVIDER_SITE_OTHER): Payer: Medicare HMO | Admitting: "Endocrinology

## 2019-03-06 ENCOUNTER — Encounter: Payer: Self-pay | Admitting: "Endocrinology

## 2019-03-06 ENCOUNTER — Other Ambulatory Visit: Payer: Self-pay

## 2019-03-06 VITALS — BP 126/81 | HR 90 | Temp 98.2°F | Ht 67.6 in | Wt 236.0 lb

## 2019-03-06 DIAGNOSIS — E1122 Type 2 diabetes mellitus with diabetic chronic kidney disease: Secondary | ICD-10-CM

## 2019-03-06 DIAGNOSIS — N1831 Chronic kidney disease, stage 3a: Secondary | ICD-10-CM

## 2019-03-06 DIAGNOSIS — E1121 Type 2 diabetes mellitus with diabetic nephropathy: Secondary | ICD-10-CM

## 2019-03-06 DIAGNOSIS — I10 Essential (primary) hypertension: Secondary | ICD-10-CM

## 2019-03-06 DIAGNOSIS — F17200 Nicotine dependence, unspecified, uncomplicated: Secondary | ICD-10-CM

## 2019-03-06 DIAGNOSIS — E782 Mixed hyperlipidemia: Secondary | ICD-10-CM

## 2019-03-06 DIAGNOSIS — Z6836 Body mass index (BMI) 36.0-36.9, adult: Secondary | ICD-10-CM

## 2019-03-06 DIAGNOSIS — E66812 Obesity, class 2: Secondary | ICD-10-CM | POA: Insufficient documentation

## 2019-03-06 LAB — POCT GLYCOSYLATED HEMOGLOBIN (HGB A1C): Hemoglobin A1C: 6.8 % — AB (ref 4.0–5.6)

## 2019-03-06 MED ORDER — SITAGLIPTIN PHOSPHATE 50 MG PO TABS: 50.0000 mg | ORAL_TABLET | Freq: Every day | ORAL | 4 refills | Status: DC

## 2019-03-06 NOTE — Patient Instructions (Signed)

## 2019-03-06 NOTE — Progress Notes (Signed)
Endocrinology Consult Note       03/06/2019, 11:45 AM   Subjective:    Patient ID: Brian Parsons, adult    DOB: [birth year].  [name] is being seen in consultation for management of currently uncontrolled symptomatic diabetes requested by  [name], MD.   Past Medical History:  Diagnosis Date  . Bilateral leg edema   . CHF (congestive heart failure) ([facility])   . COPD (chronic obstructive pulmonary disease) ([facility])   . Coronary atherosclerosis    Nonobstructive at cardiac catheterization April 2017  . Essential hypertension   . On home O2    2L N/C prn  . Polycythemia     Past Surgical History:  Procedure Laterality Date  . CARDIAC CATHETERIZATION N/A 08/10/2015   Procedure: Left Heart Cath and Coronary Angiography;  Surgeon: Troy Sine, MD;  Location: [facility];  Service: Cardiovascular;  Laterality: N/A;    Social History   Socioeconomic History  . Marital status: Divorced    Spouse name: Not on file  . Number of children: Not on file  . Years of education: Not on file  . Highest education level: Not on file  Occupational History  . Not on file  Social Needs  . Financial resource strain: Not on file  . Food insecurity    Worry: Not on file    Inability: Not on file  . Transportation needs    Medical: Not on file    Non-medical: Not on file  Tobacco Use  . Smoking status: Current Every Day Smoker    Packs/day: 0.50    Years: 50.00    Pack years: 25.00    Types: Cigarettes    Start date: 04/17/1961  . Smokeless tobacco: Never Used  Substance and Sexual Activity  . Alcohol use: No    Alcohol/week: 0.0 standard drinks    Comment: No EtOH for 7 years  . Drug use: No  . Sexual activity: Yes  Lifestyle  . Physical activity    Days per week: Not on file    Minutes per session: Not on file  . Stress: Not on file  Relationships  . Social Herbalist on  phone: Not on file    Gets together: Not on file    Attends religious service: Not on file    Active member of club or organization: Not on file    Attends meetings of clubs or organizations: Not on file    Relationship status: Not on file  Other Topics Concern  . Not on file  Social History Narrative  . Not on file    Family History  Problem Relation Age of Onset  . Stroke Mother   . Emphysema Father     Outpatient Encounter Medications as of 03/06/2019  Medication Sig  . albuterol (PROVENTIL) (2.5 MG/3ML) 0.083% nebulizer solution Take 3 mLs (2.5 mg total) by nebulization every 4 (four) hours as needed for wheezing or shortness of breath.  . allopurinol (ZYLOPRIM) 100 MG tablet Take 200 mg by mouth 2 (two) times daily.   Marland Kitchen aspirin EC 81 MG tablet Take 81  mg by mouth daily.  Marland Kitchen atorvastatin (LIPITOR) 20 MG tablet Take 20 mg by mouth daily at 6 PM.   . carvedilol (COREG) 3.125 MG tablet Take 1 tablet (3.125 mg total) by mouth 2 (two) times daily with a meal.  . Fluticasone-Umeclidin-Vilant (TRELEGY ELLIPTA) 100-62.5-25 MCG/INH AEPB Inhale 1 puff into the lungs daily.  Marland Kitchen ipratropium-albuterol (DUONEB) 0.5-2.5 (3) MG/3ML SOLN 3 mLs every 6 (six) hours as needed.   . Magnesium Gluconate 500 (27 Mg) MG TABS Take by mouth.  . metolazone (ZAROXOLYN) 2.5 MG tablet Take 2.5 mg by mouth every other day.   . nicotine (NICODERM CQ) 21 mg/24hr patch Place 1 patch (21 mg total) onto the skin daily.  . OXYGEN Inhale into the lungs. 2.5L at home use  . potassium chloride (MICRO-K) 10 MEQ CR capsule Take 1 capsule by mouth daily.  Marland Kitchen torsemide (DEMADEX) 20 MG tablet Take 80 mg by mouth 2 (two) times daily.   . vitamin B-12 (CYANOCOBALAMIN) 1000 MCG tablet Take 1,000 mcg by mouth daily.  . [DISCONTINUED] JANUVIA 100 MG tablet Take 100 mg by mouth daily.   . sitaGLIPtin (JANUVIA) 50 MG tablet Take 1 tablet (50 mg total) by mouth daily.   No facility-administered encounter medications on file as of  03/06/2019.     ALLERGIES: Allergies  Allergen Reactions  . Amoxicillin Hives  . Penicillins Hives    Has patient had a PCN reaction causing immediate rash, facial/tongue/throat swelling, SOB or lightheadedness with hypotension: Unknown Has patient had a PCN reaction causing severe rash involving mucus membranes or skin necrosis: No Has patient had a PCN reaction that required hospitalization No Has patient had a PCN reaction occurring within the last 10 years: No If all of the above answers are "NO", then may proceed with Cephalosporin use.     VACCINATION STATUS:  There is no immunization history on file for this patient.  Diabetes He presents for his initial diabetic visit. He has type 2 diabetes mellitus. Onset time: He was diagnosed few months ago at approximate age of 44 years. His disease course has been improving. There are no hypoglycemic associated symptoms. Pertinent negatives for hypoglycemia include no confusion, headaches, pallor or seizures. Associated symptoms include fatigue. Pertinent negatives for diabetes include no chest pain, no polydipsia, no polyphagia and no polyuria. There are no hypoglycemic complications. Symptoms are improving. Diabetic complications include heart disease and nephropathy. Risk factors for coronary artery disease include dyslipidemia, diabetes mellitus, family history, male sex, obesity, tobacco exposure, sedentary lifestyle and hypertension. Current diabetic treatment includes oral agent (monotherapy). His weight is fluctuating minimally. He is following a generally unhealthy diet. When asked about meal planning, he reported none. He has not had a previous visit with a dietitian. He never participates in exercise. (He did not monitor blood glucose regularly.  He did not bring any meter nor logs to review.  His point-of-care A1c was 6.8%, improving from 8% in August 2020.) Eye exam is not current.  Hyperlipidemia This is a chronic problem. The  current episode started more than 1 year ago. The problem is controlled. Exacerbating diseases include diabetes and obesity. Pertinent negatives include no chest pain, myalgias or shortness of breath. Current antihyperlipidemic treatment includes statins. Risk factors for coronary artery disease include diabetes mellitus, dyslipidemia, hypertension, male sex and a sedentary lifestyle.  Hypertension This is a chronic problem. The current episode started more than 1 year ago. The problem is controlled. Pertinent negatives include no chest pain, headaches, palpitations or  shortness of breath. Past treatments include diuretics. Hypertensive end-organ damage includes kidney disease and heart failure.     Review of Systems  Constitutional: Positive for fatigue. Negative for chills, fever and unexpected weight change.  HENT: Negative for trouble swallowing and voice change.   Eyes: Negative for visual disturbance.  Respiratory: Negative for cough, shortness of breath and wheezing.   Cardiovascular: Negative for chest pain, palpitations and leg swelling.  Gastrointestinal: Negative for diarrhea, nausea and vomiting.  Endocrine: Negative for cold intolerance, heat intolerance, polydipsia, polyphagia and polyuria.  Musculoskeletal: Negative for arthralgias and myalgias.  Skin: Negative for color change, pallor, rash and wound.  Neurological: Negative for seizures and headaches.  Psychiatric/Behavioral: Negative for confusion and suicidal ideas.    Objective:    BP 126/81   Pulse 90   Temp 98.2 F (36.8 C)   Ht 5' 7.6" (1.717 m)   Wt 236 lb (107 kg)   BMI 36.31 kg/m   Wt Readings from Last 3 Encounters:  03/06/19 236 lb (107 kg)  02/18/19 229 lb (103.9 kg)  01/14/19 241 lb 8 oz (109.5 kg)     Physical Exam Constitutional:      Appearance: He is well-developed.  HENT:     Head: Normocephalic and atraumatic.  Neck:     Musculoskeletal: Normal range of motion and neck supple.      Thyroid: No thyromegaly.     Trachea: No tracheal deviation.  Cardiovascular:     Rate and Rhythm: Normal rate.  Pulmonary:     Effort: Pulmonary effort is normal.  Abdominal:     Tenderness: There is no abdominal tenderness. There is no guarding.  Musculoskeletal: Normal range of motion.  Skin:    General: Skin is warm and dry.     Coloration: Skin is not pale.     Findings: No erythema or rash.  Neurological:     Mental Status: He is alert and oriented to person, place, and time.     Cranial Nerves: No cranial nerve deficit.     Coordination: Coordination normal.     Deep Tendon Reflexes: Reflexes are normal and symmetric.  Psychiatric:        Judgment: Judgment normal.       CMP ( most recent) CMP     Component Value Date/Time   NA 139 02/18/2019 1153   K 3.5 02/18/2019 1153   CL 82 (L) 02/18/2019 1153   CO2 44 (H) 02/18/2019 1153   GLUCOSE 117 (H) 02/18/2019 1153   BUN 49 (H) 02/18/2019 1153   CREATININE 1.70 (H) 02/18/2019 1153   CREATININE 1.43 (H) 10/27/2015 0919   CALCIUM 9.6 02/18/2019 1153   PROT 7.2 02/18/2019 1153   ALBUMIN 4.1 02/18/2019 1153   AST 17 02/18/2019 1153   ALT 17 02/18/2019 1153   ALKPHOS 103 02/18/2019 1153   BILITOT 0.8 02/18/2019 1153   GFRNONAA 40 (L) 02/18/2019 1153   GFRAA 46 (L) 02/18/2019 1153     Diabetic Labs (most recent): Lab Results  Component Value Date   HGBA1C 6.8 (A) 03/06/2019   HGBA1C 8.0 11/25/2018     Lipid Panel ( most recent) Lipid Panel     Component Value Date/Time   CHOL 132 07/30/2015 0441   TRIG 144 07/30/2015 0441   HDL 27 (L) 07/30/2015 0441   CHOLHDL 4.9 07/30/2015 0441   VLDL 29 07/30/2015 0441   LDLCALC 76 07/30/2015 0441      Lab Results  Component Value Date  TSH 1.098 08/03/2014      Assessment & Plan:   1. Type 2 diabetes mellitus with stage 3a chronic kidney disease, without long-term current use of insulin (HCC) - Hamlin Bohanan has currently uncontrolled symptomatic type 2  DM since  68 years of age,  with most recent A1c of 6.8 %. Recent labs reviewed. - I had a long discussion with him about the progressive nature of diabetes and the pathology behind its complications. -his diabetes is complicated by CKD, obesity/sedentary life, chronic heavy smoking and he remains at a high risk for more acute and chronic complications which include CAD, CVA, CKD, retinopathy, and neuropathy. These are all discussed in detail with him.  - I have counseled him on diet  and weight management  by adopting a carbohydrate restricted/protein rich diet. Patient is encouraged to switch to  unprocessed or minimally processed     complex starch and increased protein intake (animal or plant source), fruits, and vegetables. -  he is advised to stick to a routine mealtimes to eat 3 meals  a day and avoid unnecessary snacks ( to snack only to correct hypoglycemia).   - he admits that there is a room for improvement in his food and drink choices. - Suggestion is made for him to avoid simple carbohydrates  from his diet including Cakes, Sweet Desserts, Ice Cream, Soda (diet and regular), Sweet Tea, Candies, Chips, Cookies, Store Bought Juices, Alcohol in Excess of  1-2 drinks a day, Artificial Sweeteners,  Coffee Creamer, and "Sugar-free" Products. This will help patient to have more stable blood glucose profile and potentially avoid unintended weight gain.  - he will be scheduled with Jearld Fenton, RDN, CDE for diabetes education.  - I have approached him with the following individualized plan to manage  his diabetes and patient agrees:   -Based on his current response to [facility] treatment with A1c of 6.8%, she will not need additional treatment at this time.  - he is advised to continue on Januvia 50 mg p.o. daily at breakfast, therapeutically suitable for patient .  - he is not a candidate for metformin, SGLT2 inhibitors due to concurrent renal insufficiency. -He will not be considered for  GLP-1 receptor agonists due to his heavy smoking history.  - Specific targets for  A1c;  LDL, HDL, Triglycerides, and  Waist Circumference were discussed with the patient.  2) Blood Pressure /Hypertension:  his blood pressure is  controlled to target.   he is advised to continue his current medications including carvedilol 3.125 mg p.o. nightly, metolazone, furosemide as needed.     3) Lipids/Hyperlipidemia:   Review of his recent lipid panel showed  controlled  LDL at 76 .  he  is advised to continue    atorvastatin 20 mg daily at bedtime.  Side effects and precautions discussed with him.  4)  Weight/Diet:  Body mass index is 36.31 kg/m.  -   clearly complicating his diabetes care.   he is  a candidate for weight loss. I discussed with him the fact that loss of 5 - 10% of his  current body weight will have the most impact on his diabetes management.  Exercise, and detailed carbohydrates information provided  -  detailed on discharge instructions.  5) Chronic Care/Health Maintenance:  -he  is on statin medications and  is encouraged to initiate and continue to follow up with Ophthalmology, Dentist,  Podiatrist at least yearly or according to recommendations, and advised to  quit smoking.  I have recommended yearly flu vaccine and pneumonia vaccine at least every 5 years; moderate intensity exercise for up to 150 minutes weekly; and  sleep for at least 7 hours a day. -He is counseled extensively for smoking cessation. - he is  advised to maintain close follow up with [name], MD for primary care needs, as well as his other providers for optimal and coordinated care.  - Time spent with the patient: 60 minutes, of which >50% was spent in obtaining information about his symptoms, reviewing his previous labs/studies, evaluations, and treatments, counseling him about his currently uncontrolled, complicated type 2 diabetes; hyperlipidemia; hypertension, obesity, chronic heavy smoking, and developing  plans for long term treatment based on the latest standards of care/guidelines.  Please refer to " Patient Self Inventory" in the Media  tab for reviewed elements of pertinent patient history.  Lunette Stands participated in the discussions, expressed understanding, and voiced agreement with the above plans.  All questions were answered to his satisfaction. he is encouraged to contact clinic should he have any questions or concerns prior to his return visit.  Follow up plan: - Return in about 4 months (around 07/04/2019) for Bring Meter and Logs- A1c in Office.  Glade Lloyd, MD [facility] [address] [place], [place] [address] Phone: [phone]  Fax: [phone]    03/06/2019, 11:45 AM  This note was partially dictated with voice recognition software. Similar sounding words can be transcribed inadequately or may not  be corrected upon review.

## 2019-03-15 ENCOUNTER — Encounter (HOSPITAL_COMMUNITY): Payer: Self-pay | Admitting: Emergency Medicine

## 2019-03-15 ENCOUNTER — Other Ambulatory Visit: Payer: Self-pay

## 2019-03-15 ENCOUNTER — Emergency Department (HOSPITAL_COMMUNITY)
Admission: EM | Admit: 2019-03-15 | Discharge: 2019-03-15 | Disposition: A | Discharge status: Home / Self Care | Payer: Medicare HMO | Attending: Emergency Medicine | Admitting: Emergency Medicine

## 2019-03-15 ENCOUNTER — Emergency Department (HOSPITAL_COMMUNITY): Payer: Medicare HMO

## 2019-03-15 DIAGNOSIS — I5031 Acute diastolic (congestive) heart failure: Secondary | ICD-10-CM | POA: Insufficient documentation

## 2019-03-15 DIAGNOSIS — Z79899 Other long term (current) drug therapy: Secondary | ICD-10-CM | POA: Insufficient documentation

## 2019-03-15 DIAGNOSIS — Z7982 Long term (current) use of aspirin: Secondary | ICD-10-CM | POA: Diagnosis not present

## 2019-03-15 DIAGNOSIS — J441 Chronic obstructive pulmonary disease with (acute) exacerbation: Secondary | ICD-10-CM | POA: Diagnosis not present

## 2019-03-15 DIAGNOSIS — E119 Type 2 diabetes mellitus without complications: Secondary | ICD-10-CM | POA: Insufficient documentation

## 2019-03-15 DIAGNOSIS — I110 Hypertensive heart disease with heart failure: Secondary | ICD-10-CM | POA: Insufficient documentation

## 2019-03-15 DIAGNOSIS — F17210 Nicotine dependence, cigarettes, uncomplicated: Secondary | ICD-10-CM | POA: Insufficient documentation

## 2019-03-15 DIAGNOSIS — R0602 Shortness of breath: Secondary | ICD-10-CM | POA: Diagnosis present

## 2019-03-15 HISTORY — DX: Type 2 diabetes mellitus without complications: E11.9

## 2019-03-15 LAB — CBC WITH DIFFERENTIAL/PLATELET
Abs Immature Granulocytes: 0.07 10*3/uL (ref 0.00–0.07)
Basophils Absolute: 0 10*3/uL (ref 0.0–0.1)
Basophils Relative: 0 %
Eosinophils Absolute: 0.1 10*3/uL (ref 0.0–0.5)
Eosinophils Relative: 1 %
HCT: 54 % — ABNORMAL HIGH (ref 39.0–52.0)
Hemoglobin: 16.3 g/dL (ref 13.0–17.0)
Immature Granulocytes: 1 %
Lymphocytes Relative: 19 %
Lymphs Abs: 1.8 10*3/uL (ref 0.7–4.0)
MCH: 28.2 pg (ref 26.0–34.0)
MCHC: 30.2 g/dL (ref 30.0–36.0)
MCV: 93.4 fL (ref 80.0–100.0)
Monocytes Absolute: 1.4 10*3/uL — ABNORMAL HIGH (ref 0.1–1.0)
Monocytes Relative: 14 %
Neutro Abs: 6.4 10*3/uL (ref 1.7–7.7)
Neutrophils Relative %: 65 %
Platelets: 167 10*3/uL (ref 150–400)
RBC: 5.78 MIL/uL (ref 4.22–5.81)
RDW: 17.9 % — ABNORMAL HIGH (ref 11.5–15.5)
WBC: 9.9 10*3/uL (ref 4.0–10.5)
nRBC: 0 % (ref 0.0–0.2)

## 2019-03-15 LAB — BASIC METABOLIC PANEL
Anion gap: 13 (ref 5–15)
BUN: 41 mg/dL — ABNORMAL HIGH (ref 8–23)
CO2: 44 mmol/L — ABNORMAL HIGH (ref 22–32)
Calcium: 9 mg/dL (ref 8.9–10.3)
Chloride: 78 mmol/L — ABNORMAL LOW (ref 98–111)
Creatinine, Ser: 1.62 mg/dL — ABNORMAL HIGH (ref 0.61–1.24)
GFR calc Af Amer: 49 mL/min — ABNORMAL LOW (ref >60)
GFR calc non Af Amer: 42 mL/min — ABNORMAL LOW (ref >60)
Glucose, Bld: 118 mg/dL — ABNORMAL HIGH (ref 70–99)
Potassium: 3.5 mmol/L (ref 3.5–5.1)
Sodium: 135 mmol/L (ref 135–145)

## 2019-03-15 LAB — BRAIN NATRIURETIC PEPTIDE: B Natriuretic Peptide: 68 pg/mL (ref 0.0–100.0)

## 2019-03-15 MED ORDER — POTASSIUM CHLORIDE CRYS ER 20 MEQ PO TBCR
40.0000 meq | EXTENDED_RELEASE_TABLET | Freq: Once | ORAL | Status: AC
  Administered 2019-03-15: 18:00:00 40 meq via ORAL
  Filled 2019-03-15: qty 2

## 2019-03-15 MED ORDER — FUROSEMIDE 10 MG/ML IJ SOLN
60.0000 mg | Freq: Once | INTRAMUSCULAR | Status: AC
  Administered 2019-03-15: 18:00:00 60 mg via INTRAVENOUS
  Filled 2019-03-15: qty 6

## 2019-03-15 MED ORDER — DEXAMETHASONE 4 MG PO TABS: 4.0000 mg | ORAL_TABLET | Freq: Two times a day (BID) | ORAL | 0 refills | Status: DC

## 2019-03-15 MED ORDER — AZITHROMYCIN 250 MG PO TABS: 250.0000 mg | ORAL_TABLET | Freq: Every day | ORAL | 0 refills | Status: DC

## 2019-03-15 MED ORDER — DEXAMETHASONE 4 MG PO TABS
12.0000 mg | ORAL_TABLET | Freq: Once | ORAL | Status: AC
  Administered 2019-03-15: 12 mg via ORAL
  Filled 2019-03-15: qty 3

## 2019-03-15 MED ORDER — ALBUTEROL SULFATE HFA 108 (90 BASE) MCG/ACT IN AERS
4.0000 | INHALATION_SPRAY | Freq: Once | RESPIRATORY_TRACT | Status: AC
  Administered 2019-03-15: 17:00:00 4 via RESPIRATORY_TRACT
  Filled 2019-03-15: qty 6.7

## 2019-03-15 NOTE — ED Notes (Signed)
Patient refuses COVID test.

## 2019-03-15 NOTE — Discharge Instructions (Addendum)
Take an additional 40 mg of torsemide in addition to your normal dose for the next 5 days. Take steroids and antibiotics until finished.

## 2019-03-15 NOTE — ED Provider Notes (Signed)
[facility] EMERGENCY DEPARTMENT Provider Note   CSN: [id number] Arrival date & time: 03/15/19  1454     History   Chief Complaint Chief Complaint  Patient presents with  . Shortness of Breath    HPI Altin Brisco is a 68 y.o. adult.     HPI   68 year old male with dyspnea.  Acute on chronic.  Worsening over the past 2 to 3 days.  Is underlying history of CHF and COPD.  Is typically on 3 L of oxygen at baseline.  He went to medical facility in [place] earlier today.  They felt that he had some pulmonary edema and possibly pneumonia and referred him to the emergency room.  He denies any acute pain.  No fevers or chills.  He does endorse edema but denies any acute change in this.  Past Medical History:  Diagnosis Date  . Bilateral leg edema   . CHF (congestive heart failure) ([place])   . COPD (chronic obstructive pulmonary disease) ([place])   . Coronary atherosclerosis    Nonobstructive at cardiac catheterization April 2017  . Diabetes mellitus without complication ([place])   . Essential hypertension   . On home O2    2L N/C prn  . Polycythemia     Patient Active Problem List   Diagnosis Date Noted  . Type 2 diabetes mellitus with stage 3a chronic kidney disease, without long-term current use of insulin ([place]) 03/06/2019  . Mixed hyperlipidemia 03/06/2019  . Class 2 severe obesity due to excess calories with serious comorbidity and body mass index (BMI) of 36.0 to 36.9 in adult [facility]) 03/06/2019  . Edema 10/01/2017  . CHF, acute on chronic ([place]) 04/29/2016  . AKI (acute kidney injury) ([place]) 04/29/2016  . CHF exacerbation ([place]) 04/29/2016  . CHF (congestive heart failure) ([place]) 02/24/2016  . Exertional dyspnea   . Abnormal nuclear stress test 08/04/2015  . Chronic diastolic heart failure ([place]) 08/04/2015  . Essential hypertension, benign 08/04/2015  . Polycythemia, secondary 08/04/2015  . COPD exacerbation ([place]) 08/04/2015  . Current smoker 08/02/2015  . Acute on  chronic renal insufficiency 07/28/2015  . Acute diastolic CHF (congestive heart failure) ([place]) 07/28/2015  . Acute on chronic respiratory failure ([place]) 08/04/2014  . COPD (chronic obstructive pulmonary disease) ([place]) 08/03/2014  . Hypoxia 08/03/2014  . Lower extremity edema 08/03/2014  . Elevated troponin 08/03/2014  . Dyspnea 08/03/2014    Past Surgical History:  Procedure Laterality Date  . CARDIAC CATHETERIZATION N/A 08/10/2015   Procedure: Left Heart Cath and Coronary Angiography;  Surgeon: Troy Sine, MD;  Location: [facility];  Service: Cardiovascular;  Laterality: N/A;     OB History   No obstetric history on file.      Home Medications    Prior to Admission medications   Medication Sig Start Date End Date Taking? Authorizing Provider  albuterol (PROVENTIL) (2.5 MG/3ML) 0.083% nebulizer solution Take 3 mLs (2.5 mg total) by nebulization every 4 (four) hours as needed for wheezing or shortness of breath. 09/10/17   Francine Graven, DO  allopurinol (ZYLOPRIM) 100 MG tablet Take 200 mg by mouth 2 (two) times daily.  08/06/17   [provider]  aspirin EC 81 MG tablet Take 81 mg by mouth daily.    [provider]  atorvastatin (LIPITOR) 20 MG tablet Take 20 mg by mouth daily at 6 PM.  03/01/17 03/06/19  [provider]  carvedilol (COREG) 3.125 MG tablet Take 1 tablet (3.125 mg total) by mouth  2 (two) times daily with a meal. 10/03/17 03/06/19  Johnson, Clanford L, MD  Fluticasone-Umeclidin-Vilant (TRELEGY ELLIPTA) 100-62.5-25 MCG/INH AEPB Inhale 1 puff into the lungs daily.    [provider]  ipratropium-albuterol (DUONEB) 0.5-2.5 (3) MG/3ML SOLN 3 mLs every 6 (six) hours as needed.  01/15/19   [provider]  Magnesium Gluconate 500 (27 Mg) MG TABS Take by mouth.    [provider]  metolazone (ZAROXOLYN) 2.5 MG tablet Take 2.5 mg by mouth every other day.  10/01/18   [provider]  nicotine (NICODERM  CQ) 21 mg/24hr patch Place 1 patch (21 mg total) onto the skin daily. 08/17/15   Lendon Colonel, NP  OXYGEN Inhale into the lungs. 2.5L at home use    [provider]  potassium chloride (MICRO-K) 10 MEQ CR capsule Take 1 capsule by mouth daily. 06/29/16   [provider]  sitaGLIPtin (JANUVIA) 50 MG tablet Take 1 tablet (50 mg total) by mouth daily. 03/06/19   Cassandria Anger, MD  torsemide (DEMADEX) 20 MG tablet Take 80 mg by mouth 2 (two) times daily.     [provider]  vitamin B-12 (CYANOCOBALAMIN) 1000 MCG tablet Take 1,000 mcg by mouth daily.    [provider]    Family History Family History  Problem Relation Age of Onset  . Stroke Mother   . Emphysema Father     Social History Social History   Tobacco Use  . Smoking status: Current Every Day Smoker    Packs/day: 0.25    Years: 50.00    Pack years: 12.50    Types: Cigarettes    Start date: 04/17/1961  . Smokeless tobacco: Never Used  Substance Use Topics  . Alcohol use: No    Alcohol/week: 0.0 standard drinks    Comment: No EtOH for 7 years  . Drug use: No    Allergies   Amoxicillin and Penicillins  Review of Systems Review of Systems All systems reviewed and negative, other than as noted in HPI.  Physical Exam Updated Vital Signs BP (!) 124/94 (BP Location: Right Arm)   Pulse 97   Temp 98.3 F (36.8 C) (Oral)   Resp 20   Ht 5\' 7"  (1.702 m)   Wt 104.3 kg   SpO2 91%   BMI 36.02 kg/m   Physical Exam Vitals signs and nursing note reviewed.  Constitutional:      General: He is not in acute distress.    Appearance: He is well-developed.     Comments: Sitting in bed.  No acute distress.  Obese.  HENT:     Head: Normocephalic and atraumatic.  Eyes:     General:        Right eye: No discharge.        Left eye: No discharge.     Conjunctiva/sclera: Conjunctivae normal.  Neck:     Musculoskeletal: Neck supple.  Cardiovascular:     Rate and Rhythm: Normal  rate and regular rhythm.     Heart sounds: Normal heart sounds. No murmur. No friction rub. No gallop.   Pulmonary:     Effort: Pulmonary effort is normal. No respiratory distress.     Breath sounds: Wheezing present.     Comments: Breathing appears to be comfortable at least while at rest.  Some faint expiratory wheezing noted bilaterally. Abdominal:     General: There is no distension.     Palpations: Abdomen is soft.     Tenderness: There  is no abdominal tenderness.  Musculoskeletal:        General: No tenderness.     Right lower leg: Edema present.     Left lower leg: Edema present.     Comments: Mild to moderate symmetric lower extremity edema.  No calf tenderness.  Negative Homans.  Skin:    General: Skin is warm and dry.  Neurological:     Mental Status: He is alert.  Psychiatric:        Behavior: Behavior normal.        Thought Content: Thought content normal.    ED Treatments / Results  Labs (all labs ordered are listed, but only abnormal results are displayed) Labs Reviewed  CBC WITH DIFFERENTIAL/PLATELET - Abnormal; Notable for the following components:      Result Value   HCT 54.0 (*)    RDW 17.9 (*)    Monocytes Absolute 1.4 (*)    All other components within normal limits  BASIC METABOLIC PANEL - Abnormal; Notable for the following components:   Chloride 78 (*)    CO2 44 (*)    Glucose, Bld 118 (*)    BUN 41 (*)    Creatinine, Ser 1.62 (*)    GFR calc non Af Amer 42 (*)    GFR calc Af Amer 49 (*)    All other components within normal limits  SARS CORONAVIRUS 2 (TAT 6-24 HRS)  BRAIN NATRIURETIC PEPTIDE    EKG None  Radiology No results found.  Procedures Procedures (including critical care time)  Medications Ordered in ED Medications - No data to display   Initial Impression / Assessment and Plan / ED Course  I have reviewed the triage vital signs and the nursing notes.  Pertinent labs & imaging results that were available during my care of  the patient were reviewed by me and considered in my medical decision making (see chart for details).        68 year old male with dyspnea.  Likely multifactorial.  Does have some wheezing on exam.  Possibly some element of volume overload as well.  We will have him increase his torsemide for a few days.  Will place on steroids.  Patient is adamant about going home.  At this time I think this is probably fine although admission was offered.  Return precautions were discussed.  Close outpatient follow-up otherwise.  Final Clinical Impressions(s) / ED Diagnoses   Final diagnoses:  COPD exacerbation [facility])    ED Discharge Orders    None       Virgel Manifold, MD 03/16/19 0006

## 2019-03-15 NOTE — ED Triage Notes (Signed)
Pt states that he has been sob for couple of days. He went to centre in [place] he was told to have fluid on him and may have infection going on

## 2019-03-26 ENCOUNTER — Encounter (HOSPITAL_COMMUNITY): Payer: Medicare HMO

## 2019-03-26 ENCOUNTER — Inpatient Hospital Stay (HOSPITAL_COMMUNITY): Payer: Medicare HMO | Attending: Hematology

## 2019-03-26 ENCOUNTER — Other Ambulatory Visit: Payer: Self-pay

## 2019-03-26 DIAGNOSIS — D751 Secondary polycythemia: Secondary | ICD-10-CM | POA: Diagnosis not present

## 2019-03-26 NOTE — Progress Notes (Signed)
Brian Parsons presents today for phlebotomy per MD orders. Phlebotomy procedure started at 1345 and ended at 1350 500 grams removed. Patient observed for 28minutes after procedure without any incident. Patient tolerated procedure well. IV needle removed intact.  Vitals stable and discharged home from clinic via wheelchair. Follow up as scheduled.

## 2019-03-28 ENCOUNTER — Encounter (HOSPITAL_COMMUNITY): Payer: Medicare HMO

## 2019-04-01 NOTE — Assessment & Plan Note (Signed)
1. 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   - JAK/MPL/CALR mutations are negative. - Receives periodic phlebotomies -Received phlebotomy on 12/17/2018 for a hemoglobin of 20.9 and hematocrit of 65.8.   - Advised patient to restart aspirin 81 mg daily.   - Goal is to maintain hematocrit of 50 or less. -Hemoglobin today remains elevated at 19.0, hematocrit 61.2 recommend patient proceed with phlebotomy.  We will schedule monthly phlebotomies for patient.  Patient states he does feel better after phlebotomies with decreased shortness of breath. - Return to clinic in 1 month  2.  Tobacco abuse -Patient has a 57-year 1 pack/day smoking history.  Have recommended patient for a low-dose CT lung cancer screening. -Low-dose CT lung cancer screening was completed on January 01, 2019 impression with lung RADS category 1.  Recommendation is to continue with annual CT chest.  Also of note aortic atherosclerosis and emphysema appreciated.

## 2019-04-23 ENCOUNTER — Other Ambulatory Visit (HOSPITAL_COMMUNITY): Payer: Medicare HMO

## 2019-04-25 ENCOUNTER — Other Ambulatory Visit: Payer: Self-pay

## 2019-04-28 ENCOUNTER — Other Ambulatory Visit (HOSPITAL_COMMUNITY): Payer: Self-pay | Admitting: Nephrology

## 2019-04-28 ENCOUNTER — Encounter (HOSPITAL_COMMUNITY): Payer: Self-pay

## 2019-04-28 ENCOUNTER — Other Ambulatory Visit: Payer: Self-pay

## 2019-04-28 ENCOUNTER — Inpatient Hospital Stay (HOSPITAL_COMMUNITY): Payer: Medicare HMO | Attending: Hematology

## 2019-04-28 ENCOUNTER — Other Ambulatory Visit: Payer: Self-pay | Admitting: Nephrology

## 2019-04-28 DIAGNOSIS — N183 Chronic kidney disease, stage 3 unspecified: Secondary | ICD-10-CM

## 2019-04-28 DIAGNOSIS — D751 Secondary polycythemia: Secondary | ICD-10-CM | POA: Diagnosis present

## 2019-04-28 NOTE — Progress Notes (Signed)
To treatment room for phlebotomy.  Last note checked for orders and parameters.  History of bronchitis.  Denied fevers and chills.  No s/s of distress noted.    During phlebotomy patient moved his arm and the needle pulled out.  Unable to complete the phlebotomy.  Removed 10oz today.  Reviewed with Francene Finders, NP, with verbal orders ok to bring back next week for rest of phlebotomy.    Phlebotomy procedure started at 1300 and ended at 1308. 10 oz removed. Patient tolerated procedure well.  IV needle removed intact. Snack given with no complaints voiced.  Right peripheral IV site clean and dry with no bruising or swelling noted.  Gauze and coban applied to arm.  Denied pain at site and no complaints of pain with dressing.  Patient verbalized understanding for returning next week for rest of phlebotomy.  Dressing clean and dry.  VSS with discharge and left by wheelchair with no s/s of distress noted.

## 2019-04-30 ENCOUNTER — Encounter (HOSPITAL_COMMUNITY): Payer: Medicare HMO

## 2019-04-30 ENCOUNTER — Ambulatory Visit (HOSPITAL_COMMUNITY): Payer: Medicare HMO | Admitting: Hematology

## 2019-05-05 ENCOUNTER — Encounter (HOSPITAL_COMMUNITY): Payer: Medicare HMO

## 2019-05-05 ENCOUNTER — Other Ambulatory Visit: Payer: Self-pay

## 2019-05-05 ENCOUNTER — Ambulatory Visit (HOSPITAL_COMMUNITY)
Admission: RE | Admit: 2019-05-05 | Discharge: 2019-05-05 | Disposition: A | Discharge status: Home / Self Care | Payer: Medicare HMO | Source: Ambulatory Visit | Attending: Nephrology | Admitting: Nephrology

## 2019-05-05 DIAGNOSIS — N183 Chronic kidney disease, stage 3 unspecified: Secondary | ICD-10-CM | POA: Insufficient documentation

## 2019-05-15 ENCOUNTER — Encounter (HOSPITAL_COMMUNITY): Payer: Medicare HMO

## 2019-05-26 ENCOUNTER — Encounter (HOSPITAL_COMMUNITY): Payer: Medicare HMO

## 2019-05-28 ENCOUNTER — Other Ambulatory Visit (HOSPITAL_COMMUNITY): Payer: Medicare HMO

## 2019-06-02 ENCOUNTER — Encounter (HOSPITAL_COMMUNITY): Payer: Medicare HMO

## 2019-06-02 ENCOUNTER — Ambulatory Visit (HOSPITAL_COMMUNITY): Payer: Medicare HMO | Admitting: Hematology

## 2019-06-02 ENCOUNTER — Other Ambulatory Visit (HOSPITAL_COMMUNITY): Payer: Medicare HMO

## 2019-06-06 ENCOUNTER — Encounter (HOSPITAL_COMMUNITY): Payer: Medicare HMO

## 2019-06-16 ENCOUNTER — Other Ambulatory Visit (HOSPITAL_COMMUNITY): Payer: Self-pay | Admitting: *Deleted

## 2019-06-16 DIAGNOSIS — D751 Secondary polycythemia: Secondary | ICD-10-CM

## 2019-06-17 ENCOUNTER — Other Ambulatory Visit: Payer: Self-pay

## 2019-06-17 ENCOUNTER — Inpatient Hospital Stay (HOSPITAL_COMMUNITY): Payer: Medicare HMO | Attending: Hematology

## 2019-06-17 ENCOUNTER — Inpatient Hospital Stay (HOSPITAL_COMMUNITY): Payer: Medicare HMO

## 2019-06-17 ENCOUNTER — Inpatient Hospital Stay (HOSPITAL_BASED_OUTPATIENT_CLINIC_OR_DEPARTMENT_OTHER): Payer: Medicare HMO | Admitting: Nurse Practitioner

## 2019-06-17 DIAGNOSIS — I509 Heart failure, unspecified: Secondary | ICD-10-CM | POA: Insufficient documentation

## 2019-06-17 DIAGNOSIS — I2510 Atherosclerotic heart disease of native coronary artery without angina pectoris: Secondary | ICD-10-CM | POA: Diagnosis not present

## 2019-06-17 DIAGNOSIS — D751 Secondary polycythemia: Secondary | ICD-10-CM | POA: Insufficient documentation

## 2019-06-17 DIAGNOSIS — I110 Hypertensive heart disease with heart failure: Secondary | ICD-10-CM | POA: Insufficient documentation

## 2019-06-17 DIAGNOSIS — Z7982 Long term (current) use of aspirin: Secondary | ICD-10-CM | POA: Diagnosis not present

## 2019-06-17 DIAGNOSIS — E119 Type 2 diabetes mellitus without complications: Secondary | ICD-10-CM | POA: Diagnosis not present

## 2019-06-17 DIAGNOSIS — J449 Chronic obstructive pulmonary disease, unspecified: Secondary | ICD-10-CM | POA: Insufficient documentation

## 2019-06-17 LAB — FERRITIN: Ferritin: 15 ng/mL — ABNORMAL LOW (ref 24–336)

## 2019-06-17 LAB — CBC WITH DIFFERENTIAL/PLATELET
Abs Immature Granulocytes: 0.04 10*3/uL (ref 0.00–0.07)
Basophils Absolute: 0 10*3/uL (ref 0.0–0.1)
Basophils Relative: 0 %
Eosinophils Absolute: 0.3 10*3/uL (ref 0.0–0.5)
Eosinophils Relative: 2 %
HCT: 59.9 % — ABNORMAL HIGH (ref 39.0–52.0)
Hemoglobin: 16.6 g/dL (ref 13.0–17.0)
Immature Granulocytes: 0 %
Lymphocytes Relative: 23 %
Lymphs Abs: 2.5 10*3/uL (ref 0.7–4.0)
MCH: 25 pg — ABNORMAL LOW (ref 26.0–34.0)
MCHC: 27.7 g/dL — ABNORMAL LOW (ref 30.0–36.0)
MCV: 90.1 fL (ref 80.0–100.0)
Monocytes Absolute: 0.9 10*3/uL (ref 0.1–1.0)
Monocytes Relative: 9 %
Neutro Abs: 7.3 10*3/uL (ref 1.7–7.7)
Neutrophils Relative %: 66 %
Platelets: 182 10*3/uL (ref 150–400)
RBC: 6.65 MIL/uL — ABNORMAL HIGH (ref 4.22–5.81)
RDW: 19.6 % — ABNORMAL HIGH (ref 11.5–15.5)
WBC: 11.1 10*3/uL — ABNORMAL HIGH (ref 4.0–10.5)
nRBC: 0 % (ref 0.0–0.2)

## 2019-06-17 LAB — COMPREHENSIVE METABOLIC PANEL
ALT: 12 U/L (ref 0–44)
AST: 13 U/L — ABNORMAL LOW (ref 15–41)
Albumin: 4 g/dL (ref 3.5–5.0)
Alkaline Phosphatase: 100 U/L (ref 38–126)
Anion gap: 14 (ref 5–15)
BUN: 37 mg/dL — ABNORMAL HIGH (ref 8–23)
CO2: 46 mmol/L — ABNORMAL HIGH (ref 22–32)
Calcium: 9.4 mg/dL (ref 8.9–10.3)
Chloride: 78 mmol/L — ABNORMAL LOW (ref 98–111)
Creatinine, Ser: 1.68 mg/dL — ABNORMAL HIGH (ref 0.61–1.24)
GFR calc Af Amer: 47 mL/min — ABNORMAL LOW (ref >60)
GFR calc non Af Amer: 41 mL/min — ABNORMAL LOW (ref >60)
Glucose, Bld: 139 mg/dL — ABNORMAL HIGH (ref 70–99)
Potassium: 3.2 mmol/L — ABNORMAL LOW (ref 3.5–5.1)
Sodium: 138 mmol/L (ref 135–145)
Total Bilirubin: 1 mg/dL (ref 0.3–1.2)
Total Protein: 6.9 g/dL (ref 6.5–8.1)

## 2019-06-17 LAB — LACTATE DEHYDROGENASE: LDH: 154 U/L (ref 98–192)

## 2019-06-17 NOTE — Assessment & Plan Note (Signed)
1.  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 -JAK/MPL/CALR mutations were all negative. -He receives periodic phlebotomies. -Patient is taking aspirin 81 mg daily. -Goal is to maintain hematocrit 50 or less. -He receives monthly phlebotomies for the past 3 months. -Labs done on 06/17/2019 showed hemoglobin 16.6 and hematocrit 59.9 -He will get monthly phlebotomies for the next 3 months. -We will see him back in 3 months with labs.  2.  Tobacco abuse: -Patient has a 57-year 1 pack/day smoking history. -Low-dose CT lung cancer screening on 01/01/2019 did not reveal any concerns for malignancy.  Emphysema was noted.

## 2019-06-17 NOTE — Progress Notes (Signed)
Brian Parsons, [place] [address]   CLINIC:  Medical Oncology/Hematology  PCP:  [name], MD [address] [place] [address] [phone]   REASON FOR VISIT: Follow-up for secondary polycythemia  CURRENT THERAPY: Intermittent phlebotomies   INTERVAL HISTORY:  Brian Parsons 69 y.o. adult returns for routine follow-up for secondary polycythemia.  Patient reports the monthly phlebotomies are working well for him.  He reports he feels much better after getting them.  He still feels fatigued the week prior to getting them.  He denies any headaches or vision problems. Denies any nausea, vomiting, or diarrhea. Denies any new pains. Had not noticed any recent bleeding such as epistaxis, hematuria or hematochezia. Denies recent chest pain on exertion, shortness of breath on minimal exertion, pre-syncopal episodes, or palpitations. Denies any numbness or tingling in hands or feet. Denies any recent fevers, infections, or recent hospitalizations. Patient reports appetite at 100% and energy level at 75%.  He is eating well maintain his weight at this time.    REVIEW OF SYSTEMS:  Review of Systems  Respiratory: Positive for cough and shortness of breath.   Neurological: Positive for dizziness.  All other systems reviewed and are negative.    PAST MEDICAL/SURGICAL HISTORY:  Past Medical History:  Diagnosis Date  . Bilateral leg edema   . CHF (congestive heart failure) ([place])   . COPD (chronic obstructive pulmonary disease) ([place])   . Coronary atherosclerosis    Nonobstructive at cardiac catheterization April 2017  . Diabetes mellitus without complication ([place])   . Essential hypertension   . On home O2    2L N/C prn  . Polycythemia    Past Surgical History:  Procedure Laterality Date  . CARDIAC CATHETERIZATION N/A 08/10/2015   Procedure: Left Heart Cath and Coronary Angiography;  Surgeon: Troy Sine, MD;  Location: [facility];   Service: Cardiovascular;  Laterality: N/A;     SOCIAL HISTORY:  Social History   Socioeconomic History  . Marital status: Divorced    Spouse name: Not on file  . Number of children: Not on file  . Years of education: Not on file  . Highest education level: Not on file  Occupational History  . Not on file  Tobacco Use  . Smoking status: Current Every Day Smoker    Packs/day: 0.25    Years: 50.00    Pack years: 12.50    Types: Cigarettes    Start date: 04/17/1961  . Smokeless tobacco: Never Used  Substance and Sexual Activity  . Alcohol use: No    Alcohol/week: 0.0 standard drinks    Comment: No EtOH for 7 years  . Drug use: No  . Sexual activity: Yes  Other Topics Concern  . Not on file  Social History Narrative  . Not on file   Social Determinants of Health   Financial Resource Strain:   . Difficulty of Paying Living Expenses: Not on file  Food Insecurity:   . Worried About Charity fundraiser in the Last Year: Not on file  . Ran Out of Food in the Last Year: Not on file  Transportation Needs:   . Lack of Transportation (Medical): Not on file  . Lack of Transportation (Non-Medical): Not on file  Physical Activity:   . Days of Exercise per Week: Not on file  . Minutes of Exercise per Session: Not on file  Stress:   . Feeling of Stress : Not on  file  Social Connections:   . Frequency of Communication with Friends and Family: Not on file  . Frequency of Social Gatherings with Friends and Family: Not on file  . Attends Religious Services: Not on file  . Active Member of Clubs or Organizations: Not on file  . Attends Archivist Meetings: Not on file  . Marital Status: Not on file  Intimate Partner Violence:   . Fear of Current or Ex-Partner: Not on file  . Emotionally Abused: Not on file  . Physically Abused: Not on file  . Sexually Abused: Not on file    FAMILY HISTORY:  Family History  Problem Relation Age of Onset  . Stroke Mother   . Emphysema  Father     CURRENT MEDICATIONS:  Outpatient Encounter Medications as of 06/17/2019  Medication Sig  . allopurinol (ZYLOPRIM) 100 MG tablet Take 200 mg by mouth 2 (two) times daily.   Marland Kitchen aspirin EC 81 MG tablet Take 81 mg by mouth daily.  Marland Kitchen atorvastatin (LIPITOR) 20 MG tablet Take 20 mg by mouth daily at 6 PM.   . carvedilol (COREG) 3.125 MG tablet Take 1 tablet (3.125 mg total) by mouth 2 (two) times daily with a meal.  . Fluticasone-Umeclidin-Vilant (TRELEGY ELLIPTA) 100-62.5-25 MCG/INH AEPB Inhale 1 puff into the lungs daily.  . Magnesium Gluconate 500 (27 Mg) MG TABS Take 500 mg by mouth daily.   . metolazone (ZAROXOLYN) 2.5 MG tablet Take 2.5 mg by mouth every other day.   . OXYGEN Inhale into the lungs. 2.5L at home use  . potassium chloride (MICRO-K) 10 MEQ CR capsule Take 1 capsule by mouth daily.  . sitaGLIPtin (JANUVIA) 50 MG tablet Take 1 tablet (50 mg total) by mouth daily.  Marland Kitchen sulfamethoxazole-trimethoprim (BACTRIM DS) 800-160 MG tablet Take 1 tablet by mouth 2 (two) times daily.   Marland Kitchen torsemide (DEMADEX) 20 MG tablet Take 80 mg by mouth 2 (two) times daily.   . vitamin B-12 (CYANOCOBALAMIN) 1000 MCG tablet Take 1,000 mcg by mouth daily.  . [DISCONTINUED] azithromycin (ZITHROMAX Z-PAK) 250 MG tablet Take 1 tablet (250 mg total) by mouth daily. 2 pills (500mg ) day 1 then 1 pill (250mg ) days 2-5.  . [DISCONTINUED] vitamin B-12 (CYANOCOBALAMIN) 100 MCG tablet   . albuterol (ACCUNEB) 0.63 MG/3ML nebulizer solution Take 1 ampule by nebulization every 6 (six) hours as needed.   Marland Kitchen albuterol (PROVENTIL) (2.5 MG/3ML) 0.083% nebulizer solution Take 3 mLs (2.5 mg total) by nebulization every 4 (four) hours as needed for wheezing or shortness of breath. (Patient not taking: Reported on 06/17/2019)  . ipratropium-albuterol (DUONEB) 0.5-2.5 (3) MG/3ML SOLN 3 mLs every 6 (six) hours as needed.   . [DISCONTINUED] atorvastatin (LIPITOR) 20 MG tablet Take by mouth.  . [DISCONTINUED] dexamethasone  (DECADRON) 4 MG tablet Take 1 tablet (4 mg total) by mouth 2 (two) times daily.  . [DISCONTINUED] Fluticasone-Umeclidin-Vilant (TRELEGY ELLIPTA) 100-62.5-25 MCG/INH AEPB Inhale into the lungs.  . [DISCONTINUED] predniSONE (DELTASONE) 10 MG tablet   . [DISCONTINUED] sitaGLIPtin (JANUVIA) 100 MG tablet Take by mouth.  . [DISCONTINUED] torsemide (DEMADEX) 20 MG tablet Take by mouth.   No facility-administered encounter medications on file as of 06/17/2019.    ALLERGIES:  Allergies  Allergen Reactions  . Amoxicillin Hives  . Penicillins Hives    Has patient had a PCN reaction causing immediate rash, facial/tongue/throat swelling, SOB or lightheadedness with hypotension: Unknown Has patient had a PCN reaction causing severe rash involving mucus membranes or skin necrosis: No  Has patient had a PCN reaction that required hospitalization No Has patient had a PCN reaction occurring within the last 10 years: No If all of the above answers are "NO", then may proceed with Cephalosporin use.      PHYSICAL EXAM:  ECOG Performance status: 1  Vitals:   06/17/19 1303 06/17/19 1311  BP: 107/68   Pulse: 64   Resp: 20   Temp: (!) 97.5 F (36.4 C)   SpO2: (!) 87% 94%   Filed Weights   06/17/19 1303  Weight: 235 lb 6.4 oz (106.8 kg)    Physical Exam Constitutional:      Appearance: Normal appearance. He is normal weight.  Cardiovascular:     Rate and Rhythm: Normal rate and regular rhythm.     Heart sounds: Normal heart sounds.  Pulmonary:     Effort: Pulmonary effort is normal.     Breath sounds: Normal breath sounds.  Abdominal:     General: Bowel sounds are normal.     Palpations: Abdomen is soft.  Musculoskeletal:        General: Normal range of motion.  Skin:    General: Skin is warm.  Neurological:     Mental Status: He is alert and oriented to person, place, and time. Mental status is at baseline.  Psychiatric:        Mood and Affect: Mood normal.        Behavior: Behavior  normal.        Thought Content: Thought content normal.        Judgment: Judgment normal.      LABORATORY DATA:  I have reviewed the labs as listed.  CBC    Component Value Date/Time   WBC 11.1 (H) 06/17/2019 1156   RBC 6.65 (H) 06/17/2019 1156   HGB 16.6 06/17/2019 1156   HCT 59.9 (H) 06/17/2019 1156   PLT 182 06/17/2019 1156   MCV 90.1 06/17/2019 1156   MCH 25.0 (L) 06/17/2019 1156   MCHC 27.7 (L) 06/17/2019 1156   RDW 19.6 (H) 06/17/2019 1156   LYMPHSABS 2.5 06/17/2019 1156   MONOABS 0.9 06/17/2019 1156   EOSABS 0.3 06/17/2019 1156   BASOSABS 0.0 06/17/2019 1156   CMP Latest Ref Rng & Units 06/17/2019 03/15/2019 02/18/2019  Glucose 70 - 99 mg/dL 139(H) 118(H) 117(H)  BUN 8 - 23 mg/dL 37(H) 41(H) 49(H)  Creatinine 0.61 - 1.24 mg/dL 1.68(H) 1.62(H) 1.70(H)  Sodium 135 - 145 mmol/L 138 135 139  Potassium 3.5 - 5.1 mmol/L 3.2(L) 3.5 3.5  Chloride 98 - 111 mmol/L 78(L) 78(L) 82(L)  CO2 22 - 32 mmol/L 46(H) 44(H) 44(H)  Calcium 8.9 - 10.3 mg/dL 9.4 9.0 9.6  Total Protein 6.5 - 8.1 g/dL 6.9 - 7.2  Total Bilirubin 0.3 - 1.2 mg/dL 1.0 - 0.8  Alkaline Phos 38 - 126 U/L 100 - 103  AST 15 - 41 U/L 13(L) - 17  ALT 0 - 44 U/L 12 - 17     I personally performed a face-to-face visit.  All questions were answered to patient's stated satisfaction. Encouraged patient to call with any new concerns or questions before his next visit to the cancer center and we can certain see him sooner, if needed.     ASSESSMENT & PLAN:   Polycythemia, secondary 1.  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 -JAK/MPL/CALR mutations were all negative. -He receives periodic phlebotomies. -Patient is taking aspirin 81 mg daily. -Goal is to maintain hematocrit 50  or less. -He receives monthly phlebotomies for the past 3 months. -Labs done on 06/17/2019 showed hemoglobin 16.6 and hematocrit 59.9 -He will get monthly phlebotomies for the next 3 months. -We will see him back  in 3 months with labs.  2.  Tobacco abuse: -Patient has a 57-year 1 pack/day smoking history. -Low-dose CT lung cancer screening on 01/01/2019 did not reveal any concerns for malignancy.  Emphysema was noted.       Orders placed this encounter:  Orders Placed This Encounter  Procedures  . Phlebotomy therapeutic  . Phlebotomy therapeutic  . Phlebotomy therapeutic  . Lactate dehydrogenase  . CBC with Differential/Platelet  . Comprehensive metabolic panel  . Vitamin B12  . VITAMIN D 25 Hydroxy (Vit-D Deficiency, Fractures)      Francene Finders, FNP-C [place] [phone]

## 2019-06-17 NOTE — Progress Notes (Signed)
Brian Parsons presents today for phlebotomy per MD orders. Phlebotomy procedure started at 1333 and ended at 1339 500 grams removed. Patient observed for 15 minutes after procedure without any incident. Patient tolerated procedure well. IV needle removed intact.  Vitals stable and discharged home from clinic via wheelchair. Follow up as scheduled.

## 2019-06-17 NOTE — Patient Instructions (Signed)
[place] at [facility] Discharge Instructions  Continue to get monthly phlebotomies. We will see you for an office visit in 3 months with labs    Thank you for choosing [place] at [facility] to provide your oncology and hematology care.  To afford each patient quality time with our provider, please arrive at least 15 minutes before your scheduled appointment time.   If you have a lab appointment with the [facility] please come in thru the Main Entrance and check in at the main information desk.  You need to re-schedule your appointment should you arrive 10 or more minutes late.  We strive to give you quality time with our providers, and arriving late affects you and other patients whose appointments are after yours.  Also, if you no show three or more times for appointments you may be dismissed from the clinic at the providers discretion.     Again, thank you for choosing [facility].  Our hope is that these requests will decrease the amount of time that you wait before being seen by our physicians.       _____________________________________________________________  Should you have questions after your visit to [facility], please contact our office at [phone] between the hours of 8:00 a.m. and 4:30 p.m.  Voicemails left after 4:00 p.m. will not be returned until the following business day.  For prescription refill requests, have your pharmacy contact our office and allow 72 hours.    Due to Covid, you will need to wear a mask upon entering the hospital. If you do not have a mask, a mask will be given to you at the Main Entrance upon arrival. For doctor visits, patients may have 1 support person with them. For treatment visits, patients can not have anyone with them due to social distancing guidelines and our immunocompromised population.

## 2019-07-08 ENCOUNTER — Ambulatory Visit (INDEPENDENT_AMBULATORY_CARE_PROVIDER_SITE_OTHER): Payer: Medicare HMO | Admitting: "Endocrinology

## 2019-07-08 ENCOUNTER — Encounter: Payer: Self-pay | Admitting: "Endocrinology

## 2019-07-08 ENCOUNTER — Other Ambulatory Visit: Payer: Self-pay

## 2019-07-08 VITALS — BP 117/77 | HR 106 | Ht 67.0 in | Wt 232.4 lb

## 2019-07-08 DIAGNOSIS — N1831 Chronic kidney disease, stage 3a: Secondary | ICD-10-CM

## 2019-07-08 DIAGNOSIS — I10 Essential (primary) hypertension: Secondary | ICD-10-CM | POA: Diagnosis not present

## 2019-07-08 DIAGNOSIS — E1121 Type 2 diabetes mellitus with diabetic nephropathy: Secondary | ICD-10-CM | POA: Diagnosis not present

## 2019-07-08 DIAGNOSIS — E782 Mixed hyperlipidemia: Secondary | ICD-10-CM

## 2019-07-08 LAB — POCT GLYCOSYLATED HEMOGLOBIN (HGB A1C): Hemoglobin A1C: 6.6 % — AB (ref 4.0–5.6)

## 2019-07-08 NOTE — Progress Notes (Signed)
07/08/2019, 10:25 AM  Endocrinology follow-up note   Subjective:    Patient ID: Brian Parsons, adult    DOB: 06-Feb-1951.  Brian Parsons is being seen in follow-up after he was seen in consultation for management of currently uncontrolled symptomatic diabetes requested by  Brian Schwalbe, MD.   Past Medical History:  Diagnosis Date  . Bilateral leg edema   . CHF (congestive heart failure) ([place])   . COPD (chronic obstructive pulmonary disease) ([place])   . Coronary atherosclerosis    Nonobstructive at cardiac catheterization April 2017  . Diabetes mellitus without complication ([place])   . Essential hypertension   . On home O2    2L N/C prn  . Polycythemia     Past Surgical History:  Procedure Laterality Date  . CARDIAC CATHETERIZATION N/A 08/10/2015   Procedure: Left Heart Cath and Coronary Angiography;  Surgeon: Troy Sine, MD;  Location: [place] [facility];  Service: Cardiovascular;  Laterality: N/A;    Social History   Socioeconomic History  . Marital status: Divorced    Spouse name: Not on file  . Number of children: Not on file  . Years of education: Not on file  . Highest education level: Not on file  Occupational History  . Not on file  Tobacco Use  . Smoking status: Current Every Day Smoker    Packs/day: 0.25    Years: 50.00    Pack years: 12.50    Types: Cigarettes    Start date: 04/17/1961  . Smokeless tobacco: Never Used  Substance and Sexual Activity  . Alcohol use: No    Alcohol/week: 0.0 standard drinks    Comment: No EtOH for 7 years  . Drug use: No  . Sexual activity: Yes  Other Topics Concern  . Not on file  Social History Narrative  . Not on file   Social Determinants of Health   Financial Resource Strain:   . Difficulty of Paying Living Expenses:   Food Insecurity:   . Worried About Charity fundraiser in the Last Year:   . Arboriculturist in the Last  Year:   Transportation Needs:   . Film/video editor (Medical):   Marland Kitchen Lack of Transportation (Non-Medical):   Physical Activity:   . Days of Exercise per Week:   . Minutes of Exercise per Session:   Stress:   . Feeling of Stress :   Social Connections:   . Frequency of Communication with Friends and Family:   . Frequency of Social Gatherings with Friends and Family:   . Attends Religious Services:   . Active Member of Clubs or Organizations:   . Attends Archivist Meetings:   Marland Kitchen Marital Status:     Family History  Problem Relation Age of Onset  . Stroke Mother   . Emphysema Father     Outpatient Encounter Medications as of 07/08/2019  Medication Sig  . Potassium 99 MG TABS Take 1 tablet by mouth daily.  Marland Kitchen albuterol (ACCUNEB) 0.63 MG/3ML nebulizer solution Take 1 ampule by nebulization every 6 (six) hours as needed.   Marland Kitchen  allopurinol (ZYLOPRIM) 100 MG tablet Take 200 mg by mouth 2 (two) times daily.   Marland Kitchen aspirin EC 81 MG tablet Take 81 mg by mouth daily.  Marland Kitchen atorvastatin (LIPITOR) 20 MG tablet Take 20 mg by mouth daily at 6 PM.   . carvedilol (COREG) 3.125 MG tablet Take 1 tablet (3.125 mg total) by mouth 2 (two) times daily with a meal.  . Fluticasone-Umeclidin-Vilant (TRELEGY ELLIPTA) 100-62.5-25 MCG/INH AEPB Inhale 1 puff into the lungs daily.  Marland Kitchen ipratropium-albuterol (DUONEB) 0.5-2.5 (3) MG/3ML SOLN 3 mLs every 6 (six) hours as needed.   . Magnesium Gluconate 500 (27 Mg) MG TABS Take 500 mg by mouth daily.   . metolazone (ZAROXOLYN) 2.5 MG tablet Take 2.5 mg by mouth every other day.   . OXYGEN Inhale into the lungs. 2.5L at home use  . sitaGLIPtin (JANUVIA) 50 MG tablet Take 1 tablet (50 mg total) by mouth daily.  Marland Kitchen sulfamethoxazole-trimethoprim (BACTRIM DS) 800-160 MG tablet Take 1 tablet by mouth 2 (two) times daily.   Marland Kitchen torsemide (DEMADEX) 20 MG tablet Take 80 mg by mouth 2 (two) times daily.   . vitamin B-12 (CYANOCOBALAMIN) 1000 MCG tablet Take 1,000 mcg by  mouth daily.  . [DISCONTINUED] albuterol (PROVENTIL) (2.5 MG/3ML) 0.083% nebulizer solution Take 3 mLs (2.5 mg total) by nebulization every 4 (four) hours as needed for wheezing or shortness of breath. (Patient not taking: Reported on 06/17/2019)  . [DISCONTINUED] potassium chloride (MICRO-K) 10 MEQ CR capsule Take 1 capsule by mouth daily.   No facility-administered encounter medications on file as of 07/08/2019.    ALLERGIES: Allergies  Allergen Reactions  . Amoxicillin Hives  . Penicillins Hives    Has patient had a PCN reaction causing immediate rash, facial/tongue/throat swelling, SOB or lightheadedness with hypotension: Unknown Has patient had a PCN reaction causing severe rash involving mucus membranes or skin necrosis: No Has patient had a PCN reaction that required hospitalization No Has patient had a PCN reaction occurring within the last 10 years: No If all of the above answers are "NO", then may proceed with Cephalosporin use.     VACCINATION STATUS:  There is no immunization history on file for this patient.  Diabetes He presents for his follow-up diabetic visit. He has type 2 diabetes mellitus. Onset time: He was diagnosed few months ago at approximate age of 18 years. His disease course has been improving. There are no hypoglycemic associated symptoms. Pertinent negatives for hypoglycemia include no confusion, headaches, pallor or seizures. Associated symptoms include fatigue. Pertinent negatives for diabetes include no chest pain, no polydipsia, no polyphagia and no polyuria. There are no hypoglycemic complications. Symptoms are improving. Diabetic complications include heart disease and nephropathy. Risk factors for coronary artery disease include dyslipidemia, diabetes mellitus, family history, male sex, obesity, tobacco exposure, sedentary lifestyle and hypertension. Current diabetic treatment includes oral agent (monotherapy). His weight is fluctuating minimally. He is  following a generally unhealthy diet. When asked about meal planning, he reported none. He has not had a previous visit with a dietitian. He never participates in exercise. His home blood glucose trend is decreasing steadily. (He presents with no meter nor logs.  His point-of-care A1c 6.6%, overall improving from 8%.  He is only on Januvia 50 mg p.o. daily.    ) Eye exam is not current.  Hyperlipidemia This is a chronic problem. The current episode started more than 1 year ago. The problem is controlled. Exacerbating diseases include diabetes and obesity. Pertinent negatives include no  chest pain, myalgias or shortness of breath. Current antihyperlipidemic treatment includes statins. Risk factors for coronary artery disease include diabetes mellitus, dyslipidemia, hypertension, male sex and a sedentary lifestyle.  Hypertension This is a chronic problem. The current episode started more than 1 year ago. The problem is controlled. Pertinent negatives include no chest pain, headaches, palpitations or shortness of breath. Past treatments include diuretics. Hypertensive end-organ damage includes kidney disease and heart failure.     Review of Systems  Constitutional: Positive for fatigue. Negative for chills, fever and unexpected weight change.  HENT: Negative for trouble swallowing and voice change.   Eyes: Negative for visual disturbance.  Respiratory: Negative for cough, shortness of breath and wheezing.   Cardiovascular: Negative for chest pain, palpitations and leg swelling.  Gastrointestinal: Negative for diarrhea, nausea and vomiting.  Endocrine: Negative for cold intolerance, heat intolerance, polydipsia, polyphagia and polyuria.  Musculoskeletal: Negative for arthralgias and myalgias.  Skin: Negative for color change, pallor, rash and wound.  Neurological: Negative for seizures and headaches.  Psychiatric/Behavioral: Negative for confusion and suicidal ideas.    Objective:    BP 117/77    Pulse (!) 106   Ht 5\' 7"  (1.702 m)   Wt 232 lb 6.4 oz (105.4 kg)   BMI 36.40 kg/m   Wt Readings from Last 3 Encounters:  07/08/19 232 lb 6.4 oz (105.4 kg)  06/17/19 235 lb 6.4 oz (106.8 kg)  03/15/19 230 lb (104.3 kg)     Physical Exam Constitutional:      Appearance: He is well-developed.  HENT:     Head: Normocephalic and atraumatic.  Neck:     Thyroid: No thyromegaly.     Trachea: No tracheal deviation.  Cardiovascular:     Rate and Rhythm: Normal rate.  Pulmonary:     Effort: Pulmonary effort is normal.  Abdominal:     Tenderness: There is no abdominal tenderness. There is no guarding.  Musculoskeletal:        General: Normal range of motion.     Cervical back: Normal range of motion and neck supple.  Skin:    General: Skin is warm and dry.     Coloration: Skin is not pale.     Findings: No erythema or rash.  Neurological:     Mental Status: He is alert and oriented to person, place, and time.     Cranial Nerves: No cranial nerve deficit.     Coordination: Coordination normal.     Deep Tendon Reflexes: Reflexes are normal and symmetric.  Psychiatric:        Judgment: Judgment normal.   CMP ( most recent) CMP     Component Value Date/Time   NA 138 06/17/2019 1156   K 3.2 (L) 06/17/2019 1156   CL 78 (L) 06/17/2019 1156   CO2 46 (H) 06/17/2019 1156   GLUCOSE 139 (H) 06/17/2019 1156   BUN 37 (H) 06/17/2019 1156   CREATININE 1.68 (H) 06/17/2019 1156   CREATININE 1.43 (H) 10/27/2015 0919   CALCIUM 9.4 06/17/2019 1156   PROT 6.9 06/17/2019 1156   ALBUMIN 4.0 06/17/2019 1156   AST 13 (L) 06/17/2019 1156   ALT 12 06/17/2019 1156   ALKPHOS 100 06/17/2019 1156   BILITOT 1.0 06/17/2019 1156   GFRNONAA 41 (L) 06/17/2019 1156   GFRAA 47 (L) 06/17/2019 1156     Diabetic Labs (most recent): Lab Results  Component Value Date   HGBA1C 6.6 (A) 07/08/2019   HGBA1C 6.8 (A) 03/06/2019   HGBA1C  8.0 11/25/2018     Lipid Panel ( most recent) Lipid Panel      Component Value Date/Time   CHOL 132 07/30/2015 0441   TRIG 144 07/30/2015 0441   HDL 27 (L) 07/30/2015 0441   CHOLHDL 4.9 07/30/2015 0441   VLDL 29 07/30/2015 0441   LDLCALC 76 07/30/2015 0441      Lab Results  Component Value Date   TSH 1.098 08/03/2014      Assessment & Plan:   1. Type 2 diabetes mellitus with stage 3a chronic kidney disease, without long-term current use of insulin (HCC) - Brian Parsons has currently uncontrolled symptomatic type 2 DM since  69 years of age.  He presents with no meter nor logs.  His point-of-care A1c 6.6%, overall improving from 8%.  He is only on Januvia 50 mg p.o. daily.    - I had a long discussion with him about the progressive nature of diabetes and the pathology behind its complications. -his diabetes is complicated by CKD, obesity/sedentary life, chronic heavy smoking and he remains at a high risk for more acute and chronic complications which include CAD, CVA, CKD, retinopathy, and neuropathy. These are all discussed in detail with him.  - I have counseled him on diet  and weight management  by adopting a carbohydrate restricted/protein rich diet. Patient is encouraged to switch to  unprocessed or minimally processed     complex starch and increased protein intake (animal or plant source), fruits, and vegetables. -  he is advised to stick to a routine mealtimes to eat 3 meals  a day and avoid unnecessary snacks ( to snack only to correct hypoglycemia).   - he  admits there is a room for improvement in his diet and drink choices. -  Suggestion is made for him to avoid simple carbohydrates  from his diet including Cakes, Sweet Desserts / Pastries, Ice Cream, Soda (diet and regular), Sweet Tea, Candies, Chips, Cookies, Sweet Pastries,  Store Bought Juices, Alcohol in Excess of  1-2 drinks a day, Artificial Sweeteners, Coffee Creamer, and "Sugar-free" Products. This will help patient to have stable blood glucose profile and potentially avoid  unintended weight gain.   - he will be scheduled with Brian Parsons, RDN, CDE for diabetes education.  - I have approached him with the following individualized plan to manage  his diabetes and patient agrees:   -Based on his current response to [facility] treatment with A1c of 6.6%, he will not need any additional treatment at this time.    -He is advised to continue Januvia 50 mg p.o. daily at breakfast,  therapeutically suitable for patient .  - he is not a candidate for metformin, SGLT2 inhibitors due to concurrent renal insufficiency. -He will not be considered for GLP-1 receptor agonists due to his heavy smoking history.  - Specific targets for  A1c;  LDL, HDL, Triglycerides, and  Waist Circumference were discussed with the patient.  2) Blood Pressure /Hypertension: His blood pressure is controlled to target.   he is advised to continue his current medications including carvedilol 3.125 mg p.o. nightly, metolazone, furosemide as needed.     3) Lipids/Hyperlipidemia:   Review of his recent lipid panel showed  controlled  LDL at 76 .  he  is advised to continue atorvastatin 20 mg p.o. daily at bedtime.  Side effects and precautions discussed with him.  4)  Weight/Diet:  Body mass index is 36.4 kg/m.  -   clearly complicating his diabetes care.  he is  a candidate for weight loss. I discussed with him the fact that loss of 5 - 10% of his  current body weight will have the most impact on his diabetes management.  Exercise, and detailed carbohydrates information provided  -  detailed on discharge instructions.  5) Chronic Care/Health Maintenance:  -he  is on statin medications and  is encouraged to initiate and continue to follow up with Ophthalmology, Dentist,  Podiatrist at least yearly or according to recommendations, and advised to  quit smoking. I have recommended yearly flu vaccine and pneumonia vaccine at least every 5 years; moderate intensity exercise for up to 150 minutes weekly;  and  sleep for at least 7 hours a day. -He is counseled extensively for smoking cessation.  - he is  advised to maintain close follow up with Brian Schwalbe, MD for primary care needs, as well as his other providers for optimal and coordinated care.  -- Time spent on this patient care encounter:  35 min, of which >50% was spent in  counseling and the rest reviewing his  current and  previous labs/studies ( including abstraction from other facilities),  previous treatments, his blood glucose readings, and medications' doses and developing a plan for long-term care based on the latest recommendations for standards of care; and documenting his care.  Brian Parsons participated in the discussions, expressed understanding, and voiced agreement with the above plans.  All questions were answered to his satisfaction. he is encouraged to contact clinic should he have any questions or concerns prior to his return visit.   Follow up plan: - Return in about 4 months (around 11/07/2019) for Follow up with Pre-visit Labs, Next Visit A1c in Office.  Brian Lloyd, MD [facility] [address] [place], [place] [address] Phone: [phone]  Fax: [phone]    07/08/2019, 10:25 AM  This note was partially dictated with voice recognition software. Similar sounding words can be transcribed inadequately or may not  be corrected upon review.

## 2019-07-08 NOTE — Patient Instructions (Signed)

## 2019-07-11 LAB — MICROALBUMIN / CREATININE URINE RATIO
Creatinine, Urine: 62 mg/dL (ref 20–320)
Microalb Creat Ratio: 55 ug/mg{creat} — ABNORMAL HIGH
Microalb, Ur: 3.4 mg/dL

## 2019-07-11 LAB — LIPID PANEL
Cholesterol: 92 mg/dL
HDL: 28 mg/dL — ABNORMAL LOW
LDL Cholesterol (Calc): 42 mg/dL
Non-HDL Cholesterol (Calc): 64 mg/dL
Total CHOL/HDL Ratio: 3.3 (calc)
Triglycerides: 134 mg/dL

## 2019-07-11 LAB — T4, FREE: Free T4: 1.1 ng/dL (ref 0.8–1.8)

## 2019-07-11 LAB — TSH: TSH: 1.45 mIU/L (ref 0.40–4.50)

## 2019-07-25 ENCOUNTER — Encounter (HOSPITAL_COMMUNITY): Payer: Medicare HMO

## 2019-08-01 ENCOUNTER — Encounter (HOSPITAL_COMMUNITY): Payer: Self-pay

## 2019-08-01 ENCOUNTER — Other Ambulatory Visit: Payer: Self-pay

## 2019-08-01 ENCOUNTER — Inpatient Hospital Stay (HOSPITAL_COMMUNITY): Payer: Medicare HMO | Attending: Hematology

## 2019-08-01 DIAGNOSIS — D751 Secondary polycythemia: Secondary | ICD-10-CM | POA: Diagnosis present

## 2019-08-01 NOTE — Progress Notes (Signed)
Brian Parsons presents today for phlebotomy per MD orders. Phlebotomy procedure started at 1125and ended at 1132 500 grams removed. Patient observed for 15 minutes after procedure without any incident. Patient tolerated procedure well. IV needle removed intact.  . Vitals stable and discharged home from clinic via wheelchair. Follow up as scheduled.

## 2019-08-20 ENCOUNTER — Emergency Department (HOSPITAL_COMMUNITY): Payer: Medicare HMO

## 2019-08-20 ENCOUNTER — Encounter (HOSPITAL_COMMUNITY): Payer: Self-pay | Admitting: *Deleted

## 2019-08-20 ENCOUNTER — Other Ambulatory Visit: Payer: Self-pay

## 2019-08-20 ENCOUNTER — Emergency Department (HOSPITAL_COMMUNITY)
Admission: EM | Admit: 2019-08-20 | Discharge: 2019-08-20 | Disposition: A | Discharge status: Home / Self Care | Payer: Medicare HMO | Attending: Emergency Medicine | Admitting: Emergency Medicine

## 2019-08-20 DIAGNOSIS — R600 Localized edema: Secondary | ICD-10-CM

## 2019-08-20 DIAGNOSIS — R609 Edema, unspecified: Secondary | ICD-10-CM | POA: Insufficient documentation

## 2019-08-20 DIAGNOSIS — I509 Heart failure, unspecified: Secondary | ICD-10-CM | POA: Insufficient documentation

## 2019-08-20 DIAGNOSIS — Z7982 Long term (current) use of aspirin: Secondary | ICD-10-CM | POA: Insufficient documentation

## 2019-08-20 DIAGNOSIS — I130 Hypertensive heart and chronic kidney disease with heart failure and stage 1 through stage 4 chronic kidney disease, or unspecified chronic kidney disease: Secondary | ICD-10-CM | POA: Diagnosis not present

## 2019-08-20 DIAGNOSIS — Z7984 Long term (current) use of oral hypoglycemic drugs: Secondary | ICD-10-CM | POA: Diagnosis not present

## 2019-08-20 DIAGNOSIS — F17210 Nicotine dependence, cigarettes, uncomplicated: Secondary | ICD-10-CM | POA: Insufficient documentation

## 2019-08-20 DIAGNOSIS — N1831 Chronic kidney disease, stage 3a: Secondary | ICD-10-CM | POA: Insufficient documentation

## 2019-08-20 DIAGNOSIS — E1122 Type 2 diabetes mellitus with diabetic chronic kidney disease: Secondary | ICD-10-CM | POA: Diagnosis not present

## 2019-08-20 DIAGNOSIS — Z79899 Other long term (current) drug therapy: Secondary | ICD-10-CM | POA: Diagnosis not present

## 2019-08-20 DIAGNOSIS — J449 Chronic obstructive pulmonary disease, unspecified: Secondary | ICD-10-CM | POA: Diagnosis not present

## 2019-08-20 DIAGNOSIS — M7989 Other specified soft tissue disorders: Secondary | ICD-10-CM | POA: Diagnosis present

## 2019-08-20 LAB — CBC WITH DIFFERENTIAL/PLATELET
Abs Immature Granulocytes: 0.03 10*3/uL (ref 0.00–0.07)
Basophils Absolute: 0 10*3/uL (ref 0.0–0.1)
Basophils Relative: 0 %
Eosinophils Absolute: 0.3 10*3/uL (ref 0.0–0.5)
Eosinophils Relative: 2 %
HCT: 57.3 % — ABNORMAL HIGH (ref 39.0–52.0)
Hemoglobin: 15.9 g/dL (ref 13.0–17.0)
Immature Granulocytes: 0 %
Lymphocytes Relative: 24 %
Lymphs Abs: 2.6 10*3/uL (ref 0.7–4.0)
MCH: 23.8 pg — ABNORMAL LOW (ref 26.0–34.0)
MCHC: 27.7 g/dL — ABNORMAL LOW (ref 30.0–36.0)
MCV: 85.9 fL (ref 80.0–100.0)
Monocytes Absolute: 1 10*3/uL (ref 0.1–1.0)
Monocytes Relative: 10 %
Neutro Abs: 6.6 10*3/uL (ref 1.7–7.7)
Neutrophils Relative %: 64 %
Platelets: 178 10*3/uL (ref 150–400)
RBC: 6.67 MIL/uL — ABNORMAL HIGH (ref 4.22–5.81)
RDW: 18.9 % — ABNORMAL HIGH (ref 11.5–15.5)
WBC: 10.5 10*3/uL (ref 4.0–10.5)
nRBC: 0 % (ref 0.0–0.2)

## 2019-08-20 LAB — COMPREHENSIVE METABOLIC PANEL
ALT: 16 U/L (ref 0–44)
AST: 20 U/L (ref 15–41)
Albumin: 4.1 g/dL (ref 3.5–5.0)
Alkaline Phosphatase: 108 U/L (ref 38–126)
Anion gap: 16 — ABNORMAL HIGH (ref 5–15)
BUN: 38 mg/dL — ABNORMAL HIGH (ref 8–23)
CO2: 43 mmol/L — ABNORMAL HIGH (ref 22–32)
Calcium: 9.8 mg/dL (ref 8.9–10.3)
Chloride: 77 mmol/L — ABNORMAL LOW (ref 98–111)
Creatinine, Ser: 1.66 mg/dL — ABNORMAL HIGH (ref 0.61–1.24)
GFR calc Af Amer: 48 mL/min — ABNORMAL LOW (ref >60)
GFR calc non Af Amer: 41 mL/min — ABNORMAL LOW (ref >60)
Glucose, Bld: 122 mg/dL — ABNORMAL HIGH (ref 70–99)
Potassium: 3.9 mmol/L (ref 3.5–5.1)
Sodium: 136 mmol/L (ref 135–145)
Total Bilirubin: 1 mg/dL (ref 0.3–1.2)
Total Protein: 7.1 g/dL (ref 6.5–8.1)

## 2019-08-20 LAB — BRAIN NATRIURETIC PEPTIDE: B Natriuretic Peptide: 17 pg/mL (ref 0.0–100.0)

## 2019-08-20 NOTE — ED Provider Notes (Signed)
[facility] EMERGENCY DEPARTMENT Provider Note   CSN: [id number] Arrival date & time: 08/20/19  1246     History Chief Complaint  Patient presents with  . Leg Swelling    Brian Parsons is a 69 y.o. adult.  HPI Patient presents with edema on his legs.  States he is seen both his primary care doctor and cardiology.  States they are worried there could be a blood clot in the leg.  Or swelling of the left foot.  Has a history of edema from CHF.  No fevers.  No cough.  Is on chronic oxygen at home.  Does not feel any more short of breath than baseline.  No chest pain.  Mild pain in the lower extremities.  States he gets worse at night.  Said he was sent here to rule out infection or blood clot.    Past Medical History:  Diagnosis Date  . Bilateral leg edema   . CHF (congestive heart failure) ([place])   . COPD (chronic obstructive pulmonary disease) ([place])   . Coronary atherosclerosis    Nonobstructive at cardiac catheterization April 2017  . Diabetes mellitus without complication ([place])   . Essential hypertension   . On home O2    2L N/C prn  . Polycythemia     Patient Active Problem List   Diagnosis Date Noted  . Type 2 diabetes mellitus with stage 3a chronic kidney disease, without long-term current use of insulin ([place]) 03/06/2019  . Mixed hyperlipidemia 03/06/2019  . Class 2 severe obesity due to excess calories with serious comorbidity and body mass index (BMI) of 36.0 to 36.9 in adult [facility]) 03/06/2019  . Edema 10/01/2017  . CHF, acute on chronic ([place]) 04/29/2016  . AKI (acute kidney injury) ([employer]) 04/29/2016  . CHF exacerbation ([place]) 04/29/2016  . CHF (congestive heart failure) ([place]) 02/24/2016  . Exertional dyspnea   . Abnormal nuclear stress test 08/04/2015  . Chronic diastolic heart failure ([place]) 08/04/2015  . Essential hypertension, benign 08/04/2015  . Polycythemia, secondary 08/04/2015  . COPD exacerbation ([place]) 08/04/2015  . Current smoker 08/02/2015  . Acute on  chronic renal insufficiency 07/28/2015  . Acute diastolic CHF (congestive heart failure) ([place]) 07/28/2015  . Acute on chronic respiratory failure ([place]) 08/04/2014  . COPD (chronic obstructive pulmonary disease) ([place]) 08/03/2014  . Hypoxia 08/03/2014  . Lower extremity edema 08/03/2014  . Elevated troponin 08/03/2014  . Dyspnea 08/03/2014    Past Surgical History:  Procedure Laterality Date  . CARDIAC CATHETERIZATION N/A 08/10/2015   Procedure: Left Heart Cath and Coronary Angiography;  Surgeon: Troy Sine, MD;  Location: [facility];  Service: Cardiovascular;  Laterality: N/A;     OB History   No obstetric history on file.     Family History  Problem Relation Age of Onset  . Stroke Mother   . Emphysema Father     Social History   Tobacco Use  . Smoking status: Current Every Day Smoker    Packs/day: 0.25    Years: 50.00    Pack years: 12.50    Types: Cigarettes    Start date: 04/17/1961  . Smokeless tobacco: Never Used  Substance Use Topics  . Alcohol use: No    Alcohol/week: 0.0 standard drinks    Comment: No EtOH for 7 years  . Drug use: No    Home Medications Prior to Admission medications   Medication Sig Start Date End Date Taking? Authorizing Provider  albuterol (ACCUNEB) 0.63 MG/3ML nebulizer solution  Take 1 ampule by nebulization every 6 (six) hours as needed.  12/05/18   [provider]  allopurinol (ZYLOPRIM) 100 MG tablet Take 200 mg by mouth 2 (two) times daily.  08/06/17   [provider]  aspirin EC 81 MG tablet Take 81 mg by mouth daily.    [provider]  atorvastatin (LIPITOR) 20 MG tablet Take 20 mg by mouth daily at 6 PM.  03/01/17 06/17/19  [provider]  carvedilol (COREG) 3.125 MG tablet Take 1 tablet (3.125 mg total) by mouth 2 (two) times daily with a meal. 10/03/17 06/17/19  Johnson, Clanford L, MD  Fluticasone-Umeclidin-Vilant (TRELEGY ELLIPTA) 100-62.5-25 MCG/INH AEPB Inhale 1 puff into the lungs  daily.    [provider]  ipratropium-albuterol (DUONEB) 0.5-2.5 (3) MG/3ML SOLN 3 mLs every 6 (six) hours as needed.  01/15/19   [provider]  Magnesium Gluconate 500 (27 Mg) MG TABS Take 500 mg by mouth daily.     [provider]  metolazone (ZAROXOLYN) 2.5 MG tablet Take 2.5 mg by mouth every other day.  10/01/18   [provider]  OXYGEN Inhale into the lungs. 2.5L at home use    [provider]  Potassium 99 MG TABS Take 1 tablet by mouth daily.    [provider]  sitaGLIPtin (JANUVIA) 50 MG tablet Take 1 tablet (50 mg total) by mouth daily. 03/06/19   Cassandria Anger, MD  sulfamethoxazole-trimethoprim (BACTRIM DS) 800-160 MG tablet Take 1 tablet by mouth 2 (two) times daily.  05/07/19   [provider]  torsemide (DEMADEX) 20 MG tablet Take 80 mg by mouth 2 (two) times daily.     [provider]  vitamin B-12 (CYANOCOBALAMIN) 1000 MCG tablet Take 1,000 mcg by mouth daily.    [provider]    Allergies    Amoxicillin and Penicillins  Review of Systems   Review of Systems  Constitutional: Negative for appetite change.  HENT: Negative for congestion.   Respiratory: Positive for cough. Negative for shortness of breath.   Cardiovascular: Positive for leg swelling.  Gastrointestinal: Negative for abdominal pain.  Genitourinary: Negative for flank pain.  Musculoskeletal: Negative for back pain.  Skin: Positive for color change. Negative for pallor.  Neurological: Negative for weakness.    Physical Exam Updated Vital Signs BP 133/84 (BP Location: Right Arm)   Pulse 67   Temp 97.8 F (36.6 C) (Oral)   Resp 20   Ht 5\' 7"  (1.702 m)   Wt 100.2 kg   SpO2 (!) 88%   BMI 34.61 kg/m   Physical Exam Vitals and nursing note reviewed.  HENT:     Head: Normocephalic.  Eyes:     Pupils: Pupils are equal, round, and reactive to light.  Cardiovascular:     Rate and Rhythm: Regular rhythm.    Pulmonary:     Breath sounds: No wheezing, rhonchi or rales.     Comments: Mildly harsh breath sounds. Abdominal:     Tenderness: There is no abdominal tenderness.  Musculoskeletal:     Cervical back: Neck supple.     Right lower leg: Edema present.     Left lower leg: Edema present.     Comments: Pitting edema bilateral lower extremities.  Worse on left lower leg and foot.  Mild erythema but no skin induration.  Neurological:     Mental Status: He is alert.     ED Results / Procedures / Treatments   Labs (all  labs ordered are listed, but only abnormal results are displayed) Labs Reviewed  CBC WITH DIFFERENTIAL/PLATELET - Abnormal; Notable for the following components:      Result Value   RBC 6.67 (*)    HCT 57.3 (*)    MCH 23.8 (*)    MCHC 27.7 (*)    RDW 18.9 (*)    All other components within normal limits  COMPREHENSIVE METABOLIC PANEL  BRAIN NATRIURETIC PEPTIDE    EKG None  Radiology US Venous Img Lower Unilateral Left  Result Date: 08/20/2019 CLINICAL DATA:  Acute left lower extremity swelling. EXAM: Left LOWER EXTREMITY VENOUS DOPPLER ULTRASOUND TECHNIQUE: Gray-scale sonography with compression, as well as color and duplex ultrasound, were performed to evaluate the deep venous system(s) from the level of the common femoral vein through the popliteal and proximal calf veins. COMPARISON:  None. FINDINGS: VENOUS Normal compressibility of the common femoral, superficial femoral, and popliteal veins, as well as the visualized calf veins. Visualized portions of profunda femoral vein and great saphenous vein unremarkable. No filling defects to suggest DVT on grayscale or color Doppler imaging. Doppler waveforms show normal direction of venous flow, normal respiratory plasticity and response to augmentation. Limited views of the contralateral common femoral vein are unremarkable. OTHER None. Limitations: none IMPRESSION: Negative. Electronically Signed   By: Marijo Conception M.D.    On: 08/20/2019 14:33    Procedures Procedures (including critical care time)  Medications Ordered in ED Medications - No data to display  ED Course  I have reviewed the triage vital signs and the nursing notes.  Pertinent labs & imaging results that were available during my care of the patient were reviewed by me and considered in my medical decision making (see chart for details).    MDM Rules/Calculators/A&P                      Patient with pitting edema both lower extremities but worse on the left.  Doppler done negative.  White count normal.  Infection felt less likely.  Lungs clear.  Does not feel more short of breath.  CMP and BNP pending but likely discharge home with outpatient follow-up.  Mild hypoxia likely due to not having his chronic oxygen on.  Does not feel short of breath. Care turned over to Dr. Rogene Houston. Final Clinical Impression(s) / ED Diagnoses Final diagnoses:  Peripheral edema    Rx / DC Orders ED Discharge Orders    None       Davonna Belling, MD 08/20/19 1500

## 2019-08-20 NOTE — ED Triage Notes (Signed)
Pt with left leg/foot swollen for two weeks per pt and has gotten worse, pt states he has seen doctor for it.  Here to r/o DVT.

## 2019-08-25 ENCOUNTER — Other Ambulatory Visit: Payer: Self-pay

## 2019-08-25 ENCOUNTER — Inpatient Hospital Stay (HOSPITAL_COMMUNITY): Payer: Medicare HMO | Attending: Hematology

## 2019-08-25 DIAGNOSIS — D751 Secondary polycythemia: Secondary | ICD-10-CM | POA: Diagnosis not present

## 2019-08-25 NOTE — Patient Instructions (Signed)
[facility] _______________________________________________________________  Thank you for choosing [facility] at [facility] to provide your oncology and hematology care.  To afford each patient quality time with our providers, please arrive at least 15 minutes before your scheduled appointment.  You need to re-schedule your appointment if you arrive 10 or more minutes late.  We strive to give you quality time with our providers, and arriving late affects you and other patients whose appointments are after yours.  Also, if you no show three or more times for appointments you may be dismissed from the clinic.  Again, thank you for choosing [facility] at [facility]. Our hope is that these requests will allow you access to exceptional care and in a timely manner. _______________________________________________________________  If you have questions after your visit, please contact our office at [phone] between the hours of 8:30 a.m. and 5:00 p.m. Voicemails left after 4:30 p.m. will not be returned until the following business day. _______________________________________________________________  For prescription refill requests, have your pharmacy contact our office. _______________________________________________________________  Recommendations made by the consultant and any test results will be sent to your referring physician. _______________________________________________________________ 

## 2019-08-25 NOTE — Progress Notes (Signed)
Brian Parsons presents today for phlebotomy per MD orders. Pt reports he had something to eat prior to arriving. VSS at this time. Phlebotomy procedure started at 1345 and ended at 1351. 500 cc removed via right AC. Patient tolerated procedure well. IV needle removed intact. Site clean and dry, no bruising or swelling noted. Bandage applied. Pt observed post phlebotomy with no distress noted. Discharged in satisfactory condition via wheelchair by staff with follow up instructions.

## 2019-09-10 ENCOUNTER — Other Ambulatory Visit (HOSPITAL_COMMUNITY): Payer: Self-pay | Admitting: Nephrology

## 2019-09-10 DIAGNOSIS — E210 Primary hyperparathyroidism: Secondary | ICD-10-CM

## 2019-09-23 ENCOUNTER — Inpatient Hospital Stay (HOSPITAL_COMMUNITY): Payer: Medicare HMO | Attending: Hematology

## 2019-09-23 ENCOUNTER — Inpatient Hospital Stay (HOSPITAL_COMMUNITY): Payer: Medicare HMO

## 2019-09-23 ENCOUNTER — Other Ambulatory Visit: Payer: Self-pay

## 2019-09-23 ENCOUNTER — Inpatient Hospital Stay (HOSPITAL_BASED_OUTPATIENT_CLINIC_OR_DEPARTMENT_OTHER): Payer: Medicare HMO | Admitting: Nurse Practitioner

## 2019-09-23 DIAGNOSIS — J449 Chronic obstructive pulmonary disease, unspecified: Secondary | ICD-10-CM | POA: Diagnosis not present

## 2019-09-23 DIAGNOSIS — D751 Secondary polycythemia: Secondary | ICD-10-CM | POA: Insufficient documentation

## 2019-09-23 DIAGNOSIS — Z7982 Long term (current) use of aspirin: Secondary | ICD-10-CM | POA: Insufficient documentation

## 2019-09-23 DIAGNOSIS — F17210 Nicotine dependence, cigarettes, uncomplicated: Secondary | ICD-10-CM | POA: Insufficient documentation

## 2019-09-23 LAB — CBC WITH DIFFERENTIAL/PLATELET
Abs Immature Granulocytes: 0.05 10*3/uL (ref 0.00–0.07)
Basophils Absolute: 0 10*3/uL (ref 0.0–0.1)
Basophils Relative: 0 %
Eosinophils Absolute: 0.2 10*3/uL (ref 0.0–0.5)
Eosinophils Relative: 2 %
HCT: 53.7 % — ABNORMAL HIGH (ref 39.0–52.0)
Hemoglobin: 15.3 g/dL (ref 13.0–17.0)
Immature Granulocytes: 1 %
Lymphocytes Relative: 20 %
Lymphs Abs: 2.2 10*3/uL (ref 0.7–4.0)
MCH: 24.7 pg — ABNORMAL LOW (ref 26.0–34.0)
MCHC: 28.5 g/dL — ABNORMAL LOW (ref 30.0–36.0)
MCV: 86.6 fL (ref 80.0–100.0)
Monocytes Absolute: 1.1 10*3/uL — ABNORMAL HIGH (ref 0.1–1.0)
Monocytes Relative: 10 %
Neutro Abs: 7.3 10*3/uL (ref 1.7–7.7)
Neutrophils Relative %: 67 %
Platelets: 175 10*3/uL (ref 150–400)
RBC: 6.2 MIL/uL — ABNORMAL HIGH (ref 4.22–5.81)
RDW: 20.2 % — ABNORMAL HIGH (ref 11.5–15.5)
WBC: 10.9 10*3/uL — ABNORMAL HIGH (ref 4.0–10.5)
nRBC: 0 % (ref 0.0–0.2)

## 2019-09-23 LAB — COMPREHENSIVE METABOLIC PANEL
ALT: 12 U/L (ref 0–44)
AST: 14 U/L — ABNORMAL LOW (ref 15–41)
Albumin: 4 g/dL (ref 3.5–5.0)
Alkaline Phosphatase: 122 U/L (ref 38–126)
Anion gap: 15 (ref 5–15)
BUN: 35 mg/dL — ABNORMAL HIGH (ref 8–23)
CO2: 45 mmol/L — ABNORMAL HIGH (ref 22–32)
Calcium: 9.9 mg/dL (ref 8.9–10.3)
Chloride: 78 mmol/L — ABNORMAL LOW (ref 98–111)
Creatinine, Ser: 1.64 mg/dL — ABNORMAL HIGH (ref 0.61–1.24)
GFR calc Af Amer: 48 mL/min — ABNORMAL LOW (ref >60)
GFR calc non Af Amer: 41 mL/min — ABNORMAL LOW (ref >60)
Glucose, Bld: 127 mg/dL — ABNORMAL HIGH (ref 70–99)
Potassium: 4.4 mmol/L (ref 3.5–5.1)
Sodium: 138 mmol/L (ref 135–145)
Total Bilirubin: 0.7 mg/dL (ref 0.3–1.2)
Total Protein: 7.2 g/dL (ref 6.5–8.1)

## 2019-09-23 LAB — LACTATE DEHYDROGENASE: LDH: 139 U/L (ref 98–192)

## 2019-09-23 LAB — VITAMIN D 25 HYDROXY (VIT D DEFICIENCY, FRACTURES): Vit D, 25-Hydroxy: 5.36 ng/mL — ABNORMAL LOW (ref 30–100)

## 2019-09-23 LAB — VITAMIN B12: Vitamin B-12: 592 pg/mL (ref 180–914)

## 2019-09-23 NOTE — Assessment & Plan Note (Signed)
1.  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 -JAK/MPL/CALR mutations were all negative. -He receives periodic phlebotomies. -Patient is taking aspirin 81 mg daily. -Goal is to maintain hematocrit 50 or less. -He receives monthly phlebotomies  -Labs done on 09/23/2019 showed hemoglobin 15.3 and hematocrit 53.7 -He will get monthly phlebotomies for the next 3 months. -We will see him back in 3 months with labs.  2.  Tobacco abuse: -Patient has a 57-year 1 pack/day smoking history. -Low-dose CT lung cancer screening on 01/01/2019 did not reveal any concerns for malignancy.  Emphysema was noted.

## 2019-09-23 NOTE — Progress Notes (Signed)
[place], [place] [address]   CLINIC:  Medical Oncology/Hematology  PCP:  [name], MD [address] [place] [id number] [phone]   REASON FOR VISIT: Follow-up for polycythemia secondary to COPD   CURRENT THERAPY: Monthly phlebotomies   INTERVAL HISTORY:  Mr. Enberg 69 y.o. adult returns for routine follow-up for polycythemia.  Patient reports he feels a lot better each month after his phlebotomies.  He denies any aquagenic pruritus.  He denies any headaches as long as he gets his phlebotomies on time. Denies any nausea, vomiting, or diarrhea. Denies any new pains. Had not noticed any recent bleeding such as epistaxis, hematuria or hematochezia. Denies recent chest pain on exertion, shortness of breath on minimal exertion, pre-syncopal episodes, or palpitations. Denies any numbness or tingling in hands or feet. Denies any recent fevers, infections, or recent hospitalizations. Patient reports appetite at 100% and energy level at 50%.  He is eating well maintain his weight at this time.    REVIEW OF SYSTEMS:  Review of Systems  Respiratory: Positive for shortness of breath.   Cardiovascular: Positive for leg swelling.  Gastrointestinal: Positive for constipation.  Neurological: Positive for dizziness.  Psychiatric/Behavioral: Positive for depression and sleep disturbance.  All other systems reviewed and are negative.    PAST MEDICAL/SURGICAL HISTORY:  Past Medical History:  Diagnosis Date  . Bilateral leg edema   . CHF (congestive heart failure) ([place])   . COPD (chronic obstructive pulmonary disease) ([place])   . Coronary atherosclerosis    Nonobstructive at cardiac catheterization April 2017  . Diabetes mellitus without complication ([place])   . Essential hypertension   . On home O2    2L N/C prn  . Polycythemia    Past Surgical History:  Procedure Laterality Date  . CARDIAC CATHETERIZATION N/A 08/10/2015   Procedure:  Left Heart Cath and Coronary Angiography;  Surgeon: Troy Sine, MD;  Location: [facility];  Service: Cardiovascular;  Laterality: N/A;     SOCIAL HISTORY:  Social History   Socioeconomic History  . Marital status: Divorced    Spouse name: Not on file  . Number of children: Not on file  . Years of education: Not on file  . Highest education level: Not on file  Occupational History  . Not on file  Tobacco Use  . Smoking status: Current Every Day Smoker    Packs/day: 0.25    Years: 50.00    Pack years: 12.50    Types: Cigarettes    Start date: 04/17/1961  . Smokeless tobacco: Never Used  Substance and Sexual Activity  . Alcohol use: No    Alcohol/week: 0.0 standard drinks    Comment: No EtOH for 7 years  . Drug use: No  . Sexual activity: Yes  Other Topics Concern  . Not on file  Social History Narrative  . Not on file   Social Determinants of Health   Financial Resource Strain:   . Difficulty of Paying Living Expenses:   Food Insecurity:   . Worried About Charity fundraiser in the Last Year:   . Arboriculturist in the Last Year:   Transportation Needs:   . Film/video editor (Medical):   Marland Kitchen Lack of Transportation (Non-Medical):   Physical Activity:   . Days of Exercise per Week:   . Minutes of Exercise per Session:   Stress:   . Feeling of Stress :   Social Connections:   .  Frequency of Communication with Friends and Family:   . Frequency of Social Gatherings with Friends and Family:   . Attends Religious Services:   . Active Member of Clubs or Organizations:   . Attends Archivist Meetings:   Marland Kitchen Marital Status:   Intimate Partner Violence:   . Fear of Current or Ex-Partner:   . Emotionally Abused:   Marland Kitchen Physically Abused:   . Sexually Abused:     FAMILY HISTORY:  Family History  Problem Relation Age of Onset  . Stroke Mother   . Emphysema Father     CURRENT MEDICATIONS:  Outpatient Encounter Medications as of 09/23/2019    Medication Sig  . allopurinol (ZYLOPRIM) 100 MG tablet Take 200 mg by mouth 2 (two) times daily.   Marland Kitchen aspirin EC 81 MG tablet Take 81 mg by mouth daily.  . Fluticasone-Umeclidin-Vilant (TRELEGY ELLIPTA) 100-62.5-25 MCG/INH AEPB Inhale 1 puff into the lungs daily.  Marland Kitchen ipratropium-albuterol (DUONEB) 0.5-2.5 (3) MG/3ML SOLN 3 mLs every 6 (six) hours as needed.   . Magnesium Gluconate 500 (27 Mg) MG TABS Take 500 mg by mouth daily.   . metolazone (ZAROXOLYN) 2.5 MG tablet Take 2.5 mg by mouth every other day.   . OXYGEN Inhale into the lungs. 2.5L at home use  . Potassium 99 MG TABS Take 1 tablet by mouth daily.  . sitaGLIPtin (JANUVIA) 50 MG tablet Take 1 tablet (50 mg total) by mouth daily.  Marland Kitchen sulfamethoxazole-trimethoprim (BACTRIM DS) 800-160 MG tablet Take 1 tablet by mouth 2 (two) times daily.   Marland Kitchen torsemide (DEMADEX) 20 MG tablet Take 80 mg by mouth 2 (two) times daily.   . vitamin B-12 (CYANOCOBALAMIN) 1000 MCG tablet Take 1,000 mcg by mouth daily.  Marland Kitchen albuterol (ACCUNEB) 0.63 MG/3ML nebulizer solution Take 1 ampule by nebulization every 6 (six) hours as needed.   Marland Kitchen atorvastatin (LIPITOR) 20 MG tablet Take 20 mg by mouth daily at 6 PM.   . carvedilol (COREG) 3.125 MG tablet Take 1 tablet (3.125 mg total) by mouth 2 (two) times daily with a meal.   No facility-administered encounter medications on file as of 09/23/2019.    ALLERGIES:  Allergies  Allergen Reactions  . Amoxicillin Hives  . Penicillins Hives    Has patient had a PCN reaction causing immediate rash, facial/tongue/throat swelling, SOB or lightheadedness with hypotension: Unknown Has patient had a PCN reaction causing severe rash involving mucus membranes or skin necrosis: No Has patient had a PCN reaction that required hospitalization No Has patient had a PCN reaction occurring within the last 10 years: No If all of the above answers are "NO", then may proceed with Cephalosporin use.      PHYSICAL EXAM:  ECOG Performance  status: 1  Vitals:   09/23/19 1255  BP: 123/88  Pulse: 95  Resp: 18  Temp: (!) 97.1 F (36.2 C)  SpO2: (!) 81%   Filed Weights   09/23/19 1255  Weight: 231 lb 3.2 oz (104.9 kg)   Physical Exam Constitutional:      Appearance: Normal appearance. He is normal weight.  Cardiovascular:     Rate and Rhythm: Normal rate and regular rhythm.     Heart sounds: Normal heart sounds.  Pulmonary:     [place]: Pulmonary effort is normal.     Breath sounds: Normal breath sounds.  Abdominal:     General: Bowel sounds are normal.     Palpations: Abdomen is soft.  Musculoskeletal:  General: Normal range of motion.  Skin:    General: Skin is warm.  Neurological:     Mental Status: He is alert and oriented to person, place, and time. Mental status is at baseline.  Psychiatric:        Mood and Affect: Mood normal.        Behavior: Behavior normal.        Thought Content: Thought content normal.        Judgment: Judgment normal.      LABORATORY DATA:  I have reviewed the labs as listed.  CBC    Component Value Date/Time   WBC 10.9 (H) 09/23/2019 1214   RBC 6.20 (H) 09/23/2019 1214   HGB 15.3 09/23/2019 1214   HCT 53.7 (H) 09/23/2019 1214   PLT 175 09/23/2019 1214   MCV 86.6 09/23/2019 1214   MCH 24.7 (L) 09/23/2019 1214   MCHC 28.5 (L) 09/23/2019 1214   RDW 20.2 (H) 09/23/2019 1214   LYMPHSABS 2.2 09/23/2019 1214   MONOABS 1.1 (H) 09/23/2019 1214   EOSABS 0.2 09/23/2019 1214   BASOSABS 0.0 09/23/2019 1214   CMP Latest Ref Rng & Units 09/23/2019 08/20/2019 06/17/2019  Glucose 70 - 99 mg/dL 127(H) 122(H) 139(H)  BUN 8 - 23 mg/dL 35(H) 38(H) 37(H)  Creatinine 0.61 - 1.24 mg/dL 1.64(H) 1.66(H) 1.68(H)  Sodium 135 - 145 mmol/L 138 136 138  Potassium 3.5 - 5.1 mmol/L 4.4 3.9 3.2(L)  Chloride 98 - 111 mmol/L 78(L) 77(L) 78(L)  CO2 22 - 32 mmol/L 45(H) 43(H) 46(H)  Calcium 8.9 - 10.3 mg/dL 9.9 9.8 9.4  Total Protein 6.5 - 8.1 g/dL 7.2 7.1 6.9  Total Bilirubin 0.3 - 1.2 mg/dL  0.7 1.0 1.0  Alkaline Phos 38 - 126 U/L 122 108 100  AST 15 - 41 U/L 14(L) 20 13(L)  ALT 0 - 44 U/L 12 16 12    All questions were answered to patient's stated satisfaction. Encouraged patient to call with any new concerns or questions before his next visit to the cancer center and we can certain see him sooner, if needed.     ASSESSMENT & PLAN:  Polycythemia, secondary 1.  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 -JAK/MPL/CALR mutations were all negative. -He receives periodic phlebotomies. -Patient is taking aspirin 81 mg daily. -Goal is to maintain hematocrit 50 or less. -He receives monthly phlebotomies  -Labs done on 09/23/2019 showed hemoglobin 15.3 and hematocrit 53.7 -He will get monthly phlebotomies for the next 3 months. -We will see him back in 3 months with labs.  2.  Tobacco abuse: -Patient has a 57-year 1 pack/day smoking history. -Low-dose CT lung cancer screening on 01/01/2019 did not reveal any concerns for malignancy.  Emphysema was noted.      Orders placed this encounter:  Orders Placed This Encounter  Procedures  . Phlebotomy therapeutic  . Phlebotomy therapeutic  . CBC with Differential/Platelet  . Comprehensive metabolic panel  . Vitamin B12  . VITAMIN D 25 Hydroxy (Vit-D Deficiency, Fractures)  . Perform therapeutic phlebotomy     Francene Finders, FNP-C [place] [phone]

## 2019-09-23 NOTE — Patient Instructions (Signed)
[facility] at [facility] Discharge Instructions     Thank you for choosing [facility] at [facility] to provide your oncology and hematology care.  To afford each patient quality time with our provider, please arrive at least 15 minutes before your scheduled appointment time.   If you have a lab appointment with the Cancer Center please come in thru the Main Entrance and check in at the main information desk.  You need to re-schedule your appointment should you arrive 10 or more minutes late.  We strive to give you quality time with our providers, and arriving late affects you and other patients whose appointments are after yours.  Also, if you no show three or more times for appointments you may be dismissed from the clinic at the providers discretion.     Again, thank you for choosing [facility].  Our hope is that these requests will decrease the amount of time that you wait before being seen by our physicians.       _____________________________________________________________  Should you have questions after your visit to [facility], please contact our office at [phone] between the hours of 8:00 a.m. and 4:30 p.m.  Voicemails left after 4:00 p.m. will not be returned until the following business day.  For prescription refill requests, have your pharmacy contact our office and allow 72 hours.    Due to Covid, you will need to wear a mask upon entering the hospital. If you do not have a mask, a mask will be given to you at the Main Entrance upon arrival. For doctor visits, patients may have 1 support person with them. For treatment visits, patients can not have anyone with them due to social distancing guidelines and our immunocompromised population.      

## 2019-09-23 NOTE — Progress Notes (Signed)
Perlie Scheuring presents today for phlebotomy per MD orders.  Labs checked before procedure .   Phlebotomy procedure started at 1343 and ended at 1351. 500 cc removed. Patient tolerated procedure well. IV needle removed intact.  Patients vitals stable with no complaints voiced post phlebotomy.  No s/s of distress noted. Right arm with coban dressing and no bruising or swelling noted at site.  Sips of cola.  Patient discharged in satisfactory condition by wheelchair.

## 2019-10-14 ENCOUNTER — Other Ambulatory Visit: Payer: Self-pay | Admitting: "Endocrinology

## 2019-10-14 ENCOUNTER — Other Ambulatory Visit: Payer: Self-pay

## 2019-10-14 ENCOUNTER — Ambulatory Visit (INDEPENDENT_AMBULATORY_CARE_PROVIDER_SITE_OTHER): Payer: Medicare HMO | Admitting: Internal Medicine

## 2019-10-14 ENCOUNTER — Encounter: Payer: Self-pay | Admitting: Internal Medicine

## 2019-10-14 DIAGNOSIS — J9611 Chronic respiratory failure with hypoxia: Secondary | ICD-10-CM

## 2019-10-14 DIAGNOSIS — F17200 Nicotine dependence, unspecified, uncomplicated: Secondary | ICD-10-CM

## 2019-10-14 DIAGNOSIS — J449 Chronic obstructive pulmonary disease, unspecified: Secondary | ICD-10-CM | POA: Insufficient documentation

## 2019-10-14 DIAGNOSIS — J9612 Chronic respiratory failure with hypercapnia: Secondary | ICD-10-CM | POA: Insufficient documentation

## 2019-10-14 MED ORDER — TRELEGY ELLIPTA 100-62.5-25 MCG/INH IN AEPB: 1.0000 | INHALATION_SPRAY | Freq: Every day | RESPIRATORY_TRACT | 11 refills | Status: DC

## 2019-10-14 MED ORDER — ALBUTEROL SULFATE 0.63 MG/3ML IN NEBU: 1.0000 | INHALATION_SOLUTION | RESPIRATORY_TRACT | 11 refills | Status: DC | PRN

## 2019-10-14 NOTE — Progress Notes (Signed)
Brian Parsons, adult    DOB: June 18, 1950      MRN: [id number]   Brief patient profile:  66 yowm active smoker followed previously by Dr Luan Pulling with copd c/b chronic hypoxemia / polycythemia and referred to pulmonary clinic 10/14/2019 by Dr   Bartolo Darter in [place]      History of Present Illness  10/14/2019  Pulmonary/ 1st office eval/Murna Backer  - not vaccinated  Chief Complaint  Patient presents with  . Pulmonary Consult    Referred by Dr [name]. Former pt of Dr Luan Pulling. He uses o2 with sleep and as needed during the day. He states has good and bad days with his breathing. Smokes 1/2 ppd. He is using albuterol inhaler 2 x per wk and neb about 2 x per day.   Dyspnea:  No trouble up and down aisles at [employer] [facility] parking  s 02 but never checks it Cough: minimal am cough x 30 min >  1-2 tsp slt green better p abx  Sleep: 30 degrees with flat bed / pillows  SABA use: too much neb rx  02  2.5 hs    No obvious day to day or daytime variability or assoc   mucus plugs or hemoptysis or cp or chest tightness, subjective wheeze or overt sinus or hb symptoms.   Sleeping without nocturnal    exacerbation  of respiratory  c/o's or need for noct saba. Also denies any obvious fluctuation of symptoms with weather or environmental changes or other aggravating or alleviating factors except as outlined above   No unusual exposure hx or h/o childhood pna/ asthma or knowledge of premature birth.  Current Allergies, Complete Past Medical History, Past Surgical History, Family History, and Social History were reviewed in [employer] record.  ROS  The following are not active complaints unless bolded Hoarseness, sore throat, dysphagia, dental problems, itching, sneezing,  nasal congestion or discharge of excess mucus or purulent secretions, ear ache,   fever, chills, sweats, unintended wt loss or wt gain, classically pleuritic or exertional cp,  orthopnea pnd or arm/hand swelling  or  leg swelling, presyncope, palpitations, abdominal pain, anorexia, nausea, vomiting, diarrhea  or change in bowel habits or change in bladder habits, change in stools or change in urine, dysuria, hematuria,  rash, arthralgias, visual complaints, headache, numbness, weakness or ataxia or problems with walking or coordination,  change in mood or  memory.           Past Medical History:  Diagnosis Date  . Bilateral leg edema   . CHF (congestive heart failure) ([place])   . COPD (chronic obstructive pulmonary disease) ([place])   . Coronary atherosclerosis    Nonobstructive at cardiac catheterization April 2017  . Diabetes mellitus without complication ([place])   . Essential hypertension   . On home O2    2L N/C prn  . Polycythemia     Outpatient Medications Prior to Visit  Medication Sig Dispense Refill  . albuterol (PROAIR HFA) 108 (90 Base) MCG/ACT inhaler Inhale 2 puffs into the lungs every 6 (six) hours as needed for wheezing or shortness of breath.    . allopurinol (ZYLOPRIM) 100 MG tablet Take 200 mg by mouth 2 (two) times daily.     Marland Kitchen aspirin EC 81 MG tablet Take 81 mg by mouth daily.    Marland Kitchen atorvastatin (LIPITOR) 20 MG tablet Take 20 mg by mouth daily at 6 PM.     . carvedilol (COREG) 3.125 MG tablet  Take 1 tablet (3.125 mg total) by mouth 2 (two) times daily with a meal. 60 tablet 0  . citalopram (CELEXA) 20 MG tablet Take 20 mg by mouth daily.    . Fluticasone-Umeclidin-Vilant (TRELEGY ELLIPTA) 100-62.5-25 MCG/INH AEPB Inhale 1 puff into the lungs daily.    Marland Kitchen ipratropium-albuterol (DUONEB) 0.5-2.5 (3) MG/3ML SOLN 3 mLs every 6 (six) hours as needed.     Marland Kitchen JANUVIA 50 MG tablet TAKE 1 TABLET BY MOUTH ONCE DAILY. 90 tablet 0  . Magnesium Gluconate 500 (27 Mg) MG TABS Take 500 mg by mouth in the morning and at bedtime.     . metolazone (ZAROXOLYN) 2.5 MG tablet Take 2.5 mg by mouth every other day.     . OXYGEN Inhale into the lungs. 2.5L at home use    . Potassium 99 MG TABS Take 1 tablet by  mouth daily.    Marland Kitchen torsemide (DEMADEX) 20 MG tablet Take 80 mg by mouth 2 (two) times daily.     . vitamin B-12 (CYANOCOBALAMIN) 1000 MCG tablet Take 1,000 mcg by mouth daily.    Marland Kitchen albuterol (ACCUNEB) 0.63 MG/3ML nebulizer solution Take 1 ampule by nebulization every 6 (six) hours as needed.     . sulfamethoxazole-trimethoprim (BACTRIM DS) 800-160 MG tablet Take 1 tablet by mouth 2 (two) times daily.      No facility-administered medications prior to visit.     Objective:     BP 106/64 (BP Location: Left Arm, Cuff Size: Normal)   Pulse 74   Temp 97.7 F (36.5 C) (Oral)   Ht 5' 7.5" (1.715 m)   Wt 229 lb (103.9 kg)   SpO2 90% Comment: on RA  BMI 35.34 kg/m   SpO2: 90 % (on RA)  Mod obese wm slt hoarse/ gruff voice/ smoker's rattle   HEENT : pt wearing mask not removed for exam due to covid - 19 concerns.    NECK :  without JVD/Nodes/TM/ nl carotid upstrokes bilaterally   LUNGS: no acc muscle use,  Mild barrel  contour chest wall with bilateral  Distant bs s audible wheeze and  without cough on insp or exp maneuvers  and mild  Hyperresonant  to  percussion bilaterally     CV:  RRR  no s3 or murmur or increase in P2, and 1+ sym lower Ext edema L slt  > R  ABD:  soft and nontender with pos end  insp Hoover's  in the supine position. No bruits or organomegaly appreciated, bowel sounds nl  MS:   Nl gait/  ext warm without deformities, calf tenderness, cyanosis or clubbing No obvious joint restrictions   SKIN: warm and dry without lesions    NEURO:  alert, approp, nl sensorium with  no motor or cerebellar deficits apparent.     I personally reviewed images and agree with radiology impression as follows:   Chest LDCT  01/01/2019 1. Lung-RADS 1, negative. Continue annual screening with low-dose chest CT without contrast in 12 months. 2. Aortic Atherosclerosis (ICD10-I70.0) and Emphysema (ICD10-J43.9). 3. Coronary artery atherosclerotic calcifications.          Assessment   COPD  GOLD ? / group D symptom/ risk Active smoker maint on Trelegy    Group D in terms of symptom/risk and laba/lama/ICS  therefore appropriate rx at this point >>>  Continue trelegy.    However, he's confused about use of saba  I spent extra time with pt today reviewing appropriate use of albuterol for prn  use on exertion with the following points: 1) saba is for relief of sob that does not improve by walking a slower pace or resting but rather if the pt does not improve after trying this first. 2) If the pt is convinced, as many are, that saba helps recover from activity faster then it's easy to tell if this is the case by re-challenging : ie stop, take the inhaler, then p 5 minutes try the exact same activity (intensity of workload) that just caused the symptoms and see if they are substantially diminished or not after saba 3) if there is an activity that reproducibly causes the symptoms, try the saba 15 min before the activity on alternate days   If in fact the saba really does help, then fine to continue to use it prn but advised may need to look closer at the maintenance regimen being used to achieve better control of airways disease with exertion.    Pt informed of the seriousness of COVID 19 infection as a direct risk to lung health  and safey and to close contacts and should continue to wear a facemask in public and minimize exposure to public locations but especially avoid any area or activity where non-close contacts are not observing distancing or wearing an appropriate face mask.  I strongly recommended she take either of the vaccines available through local drugstores based on updated information on millions of Americans treated with the [place] products  which have proven both safe and  effective even against the new delta variant.        Chronic respiratory failure with hypoxia and hypercapnia (HCC) HC03  07/24/19  45  -  ONO on  2.5 lpm 10/14/2019 >>>    Advised: Make sure you check your oxygen saturations at highest level of activity to be sure it stays over 90% and adjust upward to maintain this level if needed but remember to turn it back to previous settings when you stop (to conserve your supply).     Current smoker Counseled re importance of smoking cessation but did not meet time criteria for separate billing    Already enrolled in Low dose CT screening.         Each maintenance medication was reviewed in detail including emphasizing most importantly the difference between maintenance and prns and under what circumstances the prns are to be triggered using an action plan format where appropriate.  Total time for H and P, chart review, counseling, teaching device (dpi vs hfa)  and generating customized AVS unique to this office visit / charting = 45 min        Christinia Gully, MD 10/14/2019

## 2019-10-14 NOTE — Assessment & Plan Note (Signed)
Active smoker maint on Trelegy    Group D in terms of symptom/risk and laba/lama/ICS  therefore appropriate rx at this point >>>  Continue trelegy.    However, he's confused about use of saba  I spent extra time with pt today reviewing appropriate use of albuterol for prn use on exertion with the following points: 1) saba is for relief of sob that does not improve by walking a slower pace or resting but rather if the pt does not improve after trying this first. 2) If the pt is convinced, as many are, that saba helps recover from activity faster then it's easy to tell if this is the case by re-challenging : ie stop, take the inhaler, then p 5 minutes try the exact same activity (intensity of workload) that just caused the symptoms and see if they are substantially diminished or not after saba 3) if there is an activity that reproducibly causes the symptoms, try the saba 15 min before the activity on alternate days   If in fact the saba really does help, then fine to continue to use it prn but advised may need to look closer at the maintenance regimen being used to achieve better control of airways disease with exertion.    Pt informed of the seriousness of COVID 19 infection as a direct risk to lung health  and safey and to close contacts and should continue to wear a facemask in public and minimize exposure to public locations but especially avoid any area or activity where non-close contacts are not observing distancing or wearing an appropriate face mask.  I strongly recommended she take either of the vaccines available through local drugstores based on updated information on millions of Americans treated with the [place] products  which have proven both safe and  effective even against the new delta variant.

## 2019-10-14 NOTE — Assessment & Plan Note (Addendum)
Counseled re importance of smoking cessation but did not meet time criteria for separate billing     Already enrolled in Low dose CT screening.           Each maintenance medication was reviewed in detail including emphasizing most importantly the difference between maintenance and prns and under what circumstances the prns are to be triggered using an action plan format where appropriate.  Total time for H and P, chart review, counseling, teaching device (dpi vs hfa)  and generating customized AVS unique to this office visit / charting = 45 min

## 2019-10-14 NOTE — Patient Instructions (Addendum)
Plan A = Automatic = Always=     trelegy one click each   Plan B = Backup (to supplement plan A, not to replace it) Only use your albuterol inhaler as a rescue medication to be used if you can't catch your breath by resting or doing a relaxed purse lip breathing pattern.  - The less you use it, the better it will work when you need it. - Ok to use the inhaler up to 2 puffs  every 4 hours if you must but call for appointment if use goes up over your usual need - Don't leave home without it !!  (think of it like the spare tire for your car)   Plan C = Crisis (instead of Plan B but only if Plan B stops working) - only use your albuterol nebulizer if you first try Plan B and it fails to help > ok to use the nebulizer up to every 4 hours but if start needing it regularly call for immediate appointment   Try albuterol 15 min before an activity that you know would make you short of breath and see if it makes any difference and if makes none then don't take it after activity unless you can't catch your breath.      The key is to stop smoking completely before smoking completely stops you!  I strongly urge you to get the moderna or pfizer vaccine asap based on proven safety and effectiveness   We will set you up for an overnight 02 level on your oxygen  We need your [facility] records for your PFTs done here in this office    Please schedule a follow up visit in 6 months but call sooner if needed

## 2019-10-14 NOTE — Assessment & Plan Note (Addendum)
HC03  07/24/19  45  -  ONO on  2.5 lpm 10/14/2019 >>>   Advised: Make sure you check your oxygen saturations at highest level of activity to be sure it stays over 90% and adjust upward to maintain this level if needed but remember to turn it back to previous settings when you stop (to conserve your supply).

## 2019-10-24 ENCOUNTER — Encounter (HOSPITAL_COMMUNITY): Payer: Medicare HMO

## 2019-10-31 ENCOUNTER — Inpatient Hospital Stay (HOSPITAL_COMMUNITY): Payer: Medicare HMO | Attending: Hematology

## 2019-10-31 ENCOUNTER — Other Ambulatory Visit: Payer: Self-pay

## 2019-10-31 ENCOUNTER — Encounter (HOSPITAL_COMMUNITY): Payer: Self-pay

## 2019-10-31 DIAGNOSIS — D751 Secondary polycythemia: Secondary | ICD-10-CM | POA: Diagnosis not present

## 2019-10-31 NOTE — Progress Notes (Signed)
Brian Parsons presents today for phlebotomy per MD orders. Phlebotomy procedure started at 1051 and ended at 1059. 500 cc removed. Patient tolerated procedure well. IV needle removed intact.   Patients vitals stable with no complaints voiced post phlebotomy.  No s/s of distress noted. Right arm with coban dressing and no bruising or swelling noted at site.  Sips of water.  Patient discharged in satisfactory condition with no s/s of distress noted.

## 2019-11-10 ENCOUNTER — Encounter: Payer: Self-pay | Admitting: "Endocrinology

## 2019-11-10 ENCOUNTER — Ambulatory Visit (INDEPENDENT_AMBULATORY_CARE_PROVIDER_SITE_OTHER): Payer: Medicare HMO | Admitting: "Endocrinology

## 2019-11-10 ENCOUNTER — Other Ambulatory Visit: Payer: Self-pay

## 2019-11-10 VITALS — BP 99/58 | HR 96 | Ht 67.5 in | Wt 218.6 lb

## 2019-11-10 DIAGNOSIS — E782 Mixed hyperlipidemia: Secondary | ICD-10-CM | POA: Diagnosis not present

## 2019-11-10 DIAGNOSIS — E559 Vitamin D deficiency, unspecified: Secondary | ICD-10-CM | POA: Diagnosis not present

## 2019-11-10 DIAGNOSIS — I10 Essential (primary) hypertension: Secondary | ICD-10-CM

## 2019-11-10 DIAGNOSIS — N1831 Chronic kidney disease, stage 3a: Secondary | ICD-10-CM | POA: Diagnosis not present

## 2019-11-10 DIAGNOSIS — E1121 Type 2 diabetes mellitus with diabetic nephropathy: Secondary | ICD-10-CM

## 2019-11-10 LAB — POCT GLYCOSYLATED HEMOGLOBIN (HGB A1C): Hemoglobin A1C: 5.9 % — AB (ref 4.0–5.6)

## 2019-11-10 MED ORDER — VITAMIN D (ERGOCALCIFEROL) 1.25 MG (50000 UNIT) PO CAPS
50000.0000 [IU] | ORAL_CAPSULE | ORAL | 0 refills | Status: DC
Start: 2019-11-10 — End: 2020-02-02

## 2019-11-10 NOTE — Patient Instructions (Signed)

## 2019-11-10 NOTE — Progress Notes (Signed)
11/10/2019, 12:49 PM  Endocrinology follow-up note   Subjective:    Patient ID: Brian Parsons, adult    DOB: 05-03-1950.  Brian Parsons is being seen in follow-up after he was seen in consultation for management of currently uncontrolled symptomatic diabetes requested by  [name], MD.   Past Medical History:  Diagnosis Date  . Bilateral leg edema   . CHF (congestive heart failure) ([place])   . COPD (chronic obstructive pulmonary disease) ([place])   . Coronary atherosclerosis    Nonobstructive at cardiac catheterization April 2017  . Diabetes mellitus without complication ([place])   . Essential hypertension   . On home O2    2L N/C prn  . Polycythemia     Past Surgical History:  Procedure Laterality Date  . CARDIAC CATHETERIZATION N/A 08/10/2015   Procedure: Left Heart Cath and Coronary Angiography;  Surgeon: Troy Sine, MD;  Location: [facility];  Service: Cardiovascular;  Laterality: N/A;    Social History   Socioeconomic History  . Marital status: Divorced    Spouse name: Not on file  . Number of children: Not on file  . Years of education: Not on file  . Highest education level: Not on file  Occupational History  . Not on file  Tobacco Use  . Smoking status: Current Every Day Smoker    Packs/day: 1.00    Years: 50.00    Pack years: 50.00    Types: Cigarettes    Start date: 04/17/1961  . Smokeless tobacco: Never Used  Vaping Use  . Vaping Use: Never used  Substance and Sexual Activity  . Alcohol use: No    Alcohol/week: 0.0 standard drinks    Comment: No EtOH for 7 years  . Drug use: No  . Sexual activity: Yes  Other Topics Concern  . Not on file  Social History Narrative  . Not on file   Social Determinants of Health   Financial Resource Strain:   . Difficulty of Paying Living Expenses:   Food Insecurity:   . Worried About Charity fundraiser in the  Last Year:   . Arboriculturist in the Last Year:   Transportation Needs:   . Film/video editor (Medical):   Marland Kitchen Lack of Transportation (Non-Medical):   Physical Activity:   . Days of Exercise per Week:   . Minutes of Exercise per Session:   Stress:   . Feeling of Stress :   Social Connections:   . Frequency of Communication with Friends and Family:   . Frequency of Social Gatherings with Friends and Family:   . Attends Religious Services:   . Active Member of Clubs or Organizations:   . Attends Archivist Meetings:   Marland Kitchen Marital Status:     Family History  Problem Relation Age of Onset  . Stroke Mother   . Emphysema Father     Outpatient Encounter Medications as of 11/10/2019  Medication Sig  . albuterol (ACCUNEB) 0.63 MG/3ML nebulizer solution Take 3 mLs (0.63 mg total) by nebulization every 4 (four) hours as needed.  Marland Kitchen  albuterol (PROAIR HFA) 108 (90 Base) MCG/ACT inhaler Inhale 2 puffs into the lungs every 6 (six) hours as needed for wheezing or shortness of breath.  . allopurinol (ZYLOPRIM) 100 MG tablet Take 200 mg by mouth 2 (two) times daily.   Marland Kitchen aspirin EC 81 MG tablet Take 81 mg by mouth daily.  Marland Kitchen atorvastatin (LIPITOR) 20 MG tablet Take 20 mg by mouth daily at 6 PM.   . carvedilol (COREG) 3.125 MG tablet Take 1 tablet (3.125 mg total) by mouth 2 (two) times daily with a meal.  . citalopram (CELEXA) 20 MG tablet Take 20 mg by mouth daily.  . Fluticasone-Umeclidin-Vilant (TRELEGY ELLIPTA) 100-62.5-25 MCG/INH AEPB Inhale 1 puff into the lungs daily.  Marland Kitchen JANUVIA 50 MG tablet TAKE 1 TABLET BY MOUTH ONCE DAILY.  . Magnesium Gluconate 500 (27 Mg) MG TABS Take 500 mg by mouth in the morning and at bedtime.   . metolazone (ZAROXOLYN) 2.5 MG tablet Take 2.5 mg by mouth every other day.   . OXYGEN Inhale into the lungs. 2.5L at home use  . Potassium 99 MG TABS Take 1 tablet by mouth daily.  Marland Kitchen torsemide (DEMADEX) 20 MG tablet Take 80 mg by mouth 2 (two) times daily.   .  vitamin B-12 (CYANOCOBALAMIN) 1000 MCG tablet Take 1,000 mcg by mouth daily.  . Vitamin D, Ergocalciferol, (DRISDOL) 1.25 MG (50000 UNIT) CAPS capsule Take 1 capsule (50,000 Units total) by mouth every 7 (seven) days.   No facility-administered encounter medications on file as of 11/10/2019.    ALLERGIES: Allergies  Allergen Reactions  . Amoxicillin Hives  . Penicillins Hives    Has patient had a PCN reaction causing immediate rash, facial/tongue/throat swelling, SOB or lightheadedness with hypotension: Unknown Has patient had a PCN reaction causing severe rash involving mucus membranes or skin necrosis: No Has patient had a PCN reaction that required hospitalization No Has patient had a PCN reaction occurring within the last 10 years: No If all of the above answers are "NO", then may proceed with Cephalosporin use.     VACCINATION STATUS:  There is no immunization history on file for this patient.  Diabetes He presents for his follow-up diabetic visit. He has type 2 diabetes mellitus. Onset time: He was diagnosed few months ago at approximate age of 51 years. His disease course has been improving. There are no hypoglycemic associated symptoms. Pertinent negatives for hypoglycemia include no confusion, headaches, pallor or seizures. Associated symptoms include fatigue. Pertinent negatives for diabetes include no chest pain, no polydipsia, no polyphagia and no polyuria. There are no hypoglycemic complications. Symptoms are improving. Diabetic complications include heart disease and nephropathy. Risk factors for coronary artery disease include dyslipidemia, diabetes mellitus, family history, male sex, obesity, tobacco exposure, sedentary lifestyle and hypertension. Current diabetic treatment includes oral agent (monotherapy). His weight is decreasing steadily. He is following a generally unhealthy diet. When asked about meal planning, he reported none. He has not had a previous visit with a  dietitian. He never participates in exercise. His home blood glucose trend is decreasing steadily. (He presents with no meter nor logs.  His point-of-care A1c 6.6%, overall improving from 8%.  He is only on Januvia 50 mg p.o. daily.    ) Eye exam is not current.  Hyperlipidemia This is a chronic problem. The current episode started more than 1 year ago. The problem is controlled. Exacerbating diseases include diabetes and obesity. Pertinent negatives include no chest pain, myalgias or shortness of breath. Current  antihyperlipidemic treatment includes statins. Risk factors for coronary artery disease include diabetes mellitus, dyslipidemia, hypertension, male sex and a sedentary lifestyle.  Hypertension This is a chronic problem. The current episode started more than 1 year ago. The problem is controlled. Pertinent negatives include no chest pain, headaches, palpitations or shortness of breath. Past treatments include diuretics. Hypertensive end-organ damage includes kidney disease and heart failure.     Review of Systems  Constitutional: Positive for fatigue. Negative for chills, fever and unexpected weight change.  HENT: Negative for trouble swallowing and voice change.   Eyes: Negative for visual disturbance.  Respiratory: Negative for cough, shortness of breath and wheezing.   Cardiovascular: Negative for chest pain, palpitations and leg swelling.  Gastrointestinal: Negative for diarrhea, nausea and vomiting.  Endocrine: Negative for cold intolerance, heat intolerance, polydipsia, polyphagia and polyuria.  Musculoskeletal: Negative for arthralgias and myalgias.  Skin: Negative for color change, pallor, rash and wound.  Neurological: Negative for seizures and headaches.  Psychiatric/Behavioral: Negative for confusion and suicidal ideas.    Objective:    BP (!) 99/58   Pulse 96   Ht 5' 7.5" (1.715 m)   Wt (!) 218 lb 9.6 oz (99.2 kg)   BMI 33.73 kg/m   Wt Readings from Last 3  Encounters:  11/10/19 (!) 218 lb 9.6 oz (99.2 kg)  10/31/19 230 lb (104.3 kg)  10/14/19 229 lb (103.9 kg)     Physical Exam Constitutional:      Appearance: He is well-developed.  HENT:     Head: Normocephalic and atraumatic.  Neck:     Thyroid: No thyromegaly.     Trachea: No tracheal deviation.  Cardiovascular:     Rate and Rhythm: Normal rate.  Pulmonary:     Effort: Pulmonary effort is normal.  Abdominal:     Tenderness: There is no abdominal tenderness. There is no guarding.  Musculoskeletal:        General: Normal range of motion.     Cervical back: Normal range of motion and neck supple.  Skin:    General: Skin is warm and dry.     Coloration: Skin is not pale.     Findings: No erythema or rash.  Neurological:     Mental Status: He is alert and oriented to person, place, and time.     Cranial Nerves: No cranial nerve deficit.     Coordination: Coordination normal.     Deep Tendon Reflexes: Reflexes are normal and symmetric.  Psychiatric:        Judgment: Judgment normal.   CMP ( most recent) CMP     Component Value Date/Time   NA 138 09/23/2019 1214   K 4.4 09/23/2019 1214   CL 78 (L) 09/23/2019 1214   CO2 45 (H) 09/23/2019 1214   GLUCOSE 127 (H) 09/23/2019 1214   BUN 35 (H) 09/23/2019 1214   CREATININE 1.64 (H) 09/23/2019 1214   CREATININE 1.43 (H) 10/27/2015 0919   CALCIUM 9.9 09/23/2019 1214   PROT 7.2 09/23/2019 1214   ALBUMIN 4.0 09/23/2019 1214   AST 14 (L) 09/23/2019 1214   ALT 12 09/23/2019 1214   ALKPHOS 122 09/23/2019 1214   BILITOT 0.7 09/23/2019 1214   GFRNONAA 41 (L) 09/23/2019 1214   GFRAA 48 (L) 09/23/2019 1214     Diabetic Labs (most recent): Lab Results  Component Value Date   HGBA1C 5.9 (A) 11/10/2019   HGBA1C 6.6 (A) 07/08/2019   HGBA1C 6.8 (A) 03/06/2019     Lipid Panel (  most recent) Lipid Panel     Component Value Date/Time   CHOL 92 07/10/2019 1051   TRIG 134 07/10/2019 1051   HDL 28 (L) 07/10/2019 1051   CHOLHDL  3.3 07/10/2019 1051   VLDL 29 07/30/2015 0441   LDLCALC 42 07/10/2019 1051      Lab Results  Component Value Date   TSH 1.45 07/10/2019   TSH 1.098 08/03/2014   FREET4 1.1 07/10/2019      Assessment & Plan:   1. Type 2 diabetes mellitus with stage 3a chronic kidney disease, without long-term current use of insulin (HCC) - Mackenzy Eisenberg has currently uncontrolled symptomatic type 2 DM since  69 years of age.  He presents with continued improvement in his glycemic profile and A1c of 5.9% at point-of-care today,  Overall improving from 8%.    - He is only on Januvia 50 mg p.o. daily.    - I had a long discussion with him about the progressive nature of diabetes and the pathology behind its complications. -his diabetes is complicated by CKD, obesity/sedentary life, chronic heavy smoking and he remains at a high risk for more acute and chronic complications which include CAD, CVA, CKD, retinopathy, and neuropathy. These are all discussed in detail with him.  - I have counseled him on diet  and weight management  by adopting a carbohydrate restricted/protein rich diet. Patient is encouraged to switch to  unprocessed or minimally processed     complex starch and increased protein intake (animal or plant source), fruits, and vegetables. -  he is advised to stick to a routine mealtimes to eat 3 meals  a day and avoid unnecessary snacks ( to snack only to correct hypoglycemia).  - he  admits there is a room for improvement in his diet and drink choices. -  Suggestion is made for him to avoid simple carbohydrates  from his diet including Cakes, Sweet Desserts / Pastries, Ice Cream, Soda (diet and regular), Sweet Tea, Candies, Chips, Cookies, Sweet Pastries,  Store Bought Juices, Alcohol in Excess of  1-2 drinks a day, Artificial Sweeteners, Coffee Creamer, and "Sugar-free" Products. This will help patient to have stable blood glucose profile and potentially avoid unintended weight gain.    - he  will be scheduled with Jearld Fenton, RDN, CDE for diabetes education.  - I have approached him with the following individualized plan to manage  his diabetes and patient agrees:   -Based on his current response to [facility] treatment with A1c of 5.9 %, he will not need any additional treatment at this time.    -He is advised to continue Januvia 50 mg p.o. daily at breakfast,  therapeutically suitable for patient .  She is renal function is improving, will be considered for low-dose Metformin if necessary during his next visit.  - he is not a candidate for metformin, SGLT2 inhibitors due to concurrent renal insufficiency. -He will not be considered for GLP-1 receptor agonists due to his heavy smoking history.  - Specific targets for  A1c;  LDL, HDL, Triglycerides, and  Waist Circumference were discussed with the patient.  2) Blood Pressure /Hypertension: His blood pressure is controlled to target.     he is advised to continue his current medications including carvedilol 3.125 mg p.o. nightly, metolazone, furosemide as needed.     3) Lipids/Hyperlipidemia:   Review of his recent lipid panel showed  controlled  LDL at 76 .  he  is advised to continue atorvastatin 20 mg p.o.  daily at bedtime.  Side effects and precautions discussed with him.  4)  Weight/Diet:  Body mass index is 33.73 kg/m.  -   clearly complicating his diabetes care.   he is  a candidate for weight loss. I discussed with him the fact that loss of 5 - 10% of his  current body weight will have the most impact on his diabetes management.  Exercise, and detailed carbohydrates information provided  -  detailed on discharge instructions.  5) vitamin D deficiency: His 25-hydroxy vitamin D is 5.36.  I discussed and initiated vitamin D2 50,000 units weekly x12 weeks.  6) Chronic Care/Health Maintenance:  -he  is on statin medications and  is encouraged to initiate and continue to follow up with Ophthalmology, Dentist,  Podiatrist at  least yearly or according to recommendations, and advised to  quit smoking. I have recommended yearly flu vaccine and pneumonia vaccine at least every 5 years; moderate intensity exercise for up to 150 minutes weekly; and  sleep for at least 7 hours a day. -He is counseled extensively for smoking cessation.  - he is  advised to maintain close follow up with [name], MD for primary care needs, as well as his other providers for optimal and coordinated care.  - Time spent on this patient care encounter:  35 min, of which > 50% was spent in  counseling and the rest reviewing his blood glucose logs , discussing his hypoglycemia and hyperglycemia episodes, reviewing his current and  previous labs / studies  ( including abstraction from other facilities) and medications  doses and developing a  long term treatment plan and documenting his care.   Please refer to Patient Instructions for Blood Glucose Monitoring and Insulin/Medications Dosing Guide"  in media tab for additional information. Please  also refer to " Patient Self Inventory" in the Media  tab for reviewed elements of pertinent patient history.  Lunette Stands participated in the discussions, expressed understanding, and voiced agreement with the above plans.  All questions were answered to his satisfaction. he is encouraged to contact clinic should he have any questions or concerns prior to his return visit.   Follow up plan: - Return in about 6 months (around 05/12/2020) for F/U with Pre-visit Labs, NV A1c in Office, NV Office Urine MA.  Glade Lloyd, MD [facility] [address] [place], [place] [address] Phone: [phone]  Fax: [phone]    11/10/2019, 12:49 PM  This note was partially dictated with voice recognition software. Similar sounding words can be transcribed inadequately or may not  be corrected upon review.

## 2019-11-20 ENCOUNTER — Inpatient Hospital Stay (HOSPITAL_COMMUNITY): Payer: Medicare HMO | Attending: Hematology

## 2019-11-20 ENCOUNTER — Other Ambulatory Visit: Payer: Self-pay

## 2019-11-20 DIAGNOSIS — D751 Secondary polycythemia: Secondary | ICD-10-CM | POA: Diagnosis not present

## 2019-11-20 NOTE — Progress Notes (Signed)
TPR 541ml removed. TPR began @ 1312 and ended @ 1319. Site WNL. No bleeding post needle removal. Patient drinking Coke. Patient discharged @ 1330 via wheelchair & employee transport in stable condition.

## 2019-12-18 ENCOUNTER — Other Ambulatory Visit (HOSPITAL_COMMUNITY): Payer: Medicare HMO

## 2019-12-24 ENCOUNTER — Ambulatory Visit (HOSPITAL_COMMUNITY): Payer: Medicare HMO | Admitting: Nurse Practitioner

## 2019-12-24 ENCOUNTER — Encounter (HOSPITAL_COMMUNITY): Payer: Medicare HMO

## 2019-12-26 ENCOUNTER — Inpatient Hospital Stay (HOSPITAL_COMMUNITY): Payer: Medicare HMO

## 2019-12-29 ENCOUNTER — Inpatient Hospital Stay (HOSPITAL_COMMUNITY): Payer: Medicare HMO | Attending: Hematology

## 2019-12-29 ENCOUNTER — Other Ambulatory Visit: Payer: Self-pay

## 2019-12-29 DIAGNOSIS — J449 Chronic obstructive pulmonary disease, unspecified: Secondary | ICD-10-CM | POA: Diagnosis not present

## 2019-12-29 DIAGNOSIS — D751 Secondary polycythemia: Secondary | ICD-10-CM | POA: Insufficient documentation

## 2019-12-29 LAB — COMPREHENSIVE METABOLIC PANEL
ALT: 14 U/L (ref 0–44)
AST: 16 U/L (ref 15–41)
Albumin: 3.8 g/dL (ref 3.5–5.0)
Alkaline Phosphatase: 84 U/L (ref 38–126)
Anion gap: 10 (ref 5–15)
BUN: 27 mg/dL — ABNORMAL HIGH (ref 8–23)
CO2: 41 mmol/L — ABNORMAL HIGH (ref 22–32)
Calcium: 9 mg/dL (ref 8.9–10.3)
Chloride: 89 mmol/L — ABNORMAL LOW (ref 98–111)
Creatinine, Ser: 1.44 mg/dL — ABNORMAL HIGH (ref 0.61–1.24)
GFR calc Af Amer: 56 mL/min — ABNORMAL LOW (ref >60)
GFR calc non Af Amer: 49 mL/min — ABNORMAL LOW (ref >60)
Glucose, Bld: 110 mg/dL — ABNORMAL HIGH (ref 70–99)
Potassium: 3.4 mmol/L — ABNORMAL LOW (ref 3.5–5.1)
Sodium: 140 mmol/L (ref 135–145)
Total Bilirubin: 0.5 mg/dL (ref 0.3–1.2)
Total Protein: 6.5 g/dL (ref 6.5–8.1)

## 2019-12-29 LAB — CBC WITH DIFFERENTIAL/PLATELET
Abs Immature Granulocytes: 0.03 10*3/uL (ref 0.00–0.07)
Basophils Absolute: 0 10*3/uL (ref 0.0–0.1)
Basophils Relative: 0 %
Eosinophils Absolute: 0.4 10*3/uL (ref 0.0–0.5)
Eosinophils Relative: 4 %
HCT: 48.9 % (ref 39.0–52.0)
Hemoglobin: 13.4 g/dL (ref 13.0–17.0)
Immature Granulocytes: 0 %
Lymphocytes Relative: 20 %
Lymphs Abs: 1.9 10*3/uL (ref 0.7–4.0)
MCH: 22.8 pg — ABNORMAL LOW (ref 26.0–34.0)
MCHC: 27.4 g/dL — ABNORMAL LOW (ref 30.0–36.0)
MCV: 83.3 fL (ref 80.0–100.0)
Monocytes Absolute: 0.8 10*3/uL (ref 0.1–1.0)
Monocytes Relative: 8 %
Neutro Abs: 6.5 10*3/uL (ref 1.7–7.7)
Neutrophils Relative %: 68 %
Platelets: 195 10*3/uL (ref 150–400)
RBC: 5.87 MIL/uL — ABNORMAL HIGH (ref 4.22–5.81)
RDW: 18.6 % — ABNORMAL HIGH (ref 11.5–15.5)
WBC: 9.7 10*3/uL (ref 4.0–10.5)
nRBC: 0 % (ref 0.0–0.2)

## 2019-12-29 LAB — VITAMIN B12: Vitamin B-12: 335 pg/mL (ref 180–914)

## 2019-12-29 LAB — VITAMIN D 25 HYDROXY (VIT D DEFICIENCY, FRACTURES): Vit D, 25-Hydroxy: 44.43 ng/mL (ref 30–100)

## 2020-01-02 ENCOUNTER — Inpatient Hospital Stay (HOSPITAL_COMMUNITY): Payer: Medicare HMO | Admitting: Nurse Practitioner

## 2020-01-02 ENCOUNTER — Inpatient Hospital Stay (HOSPITAL_COMMUNITY): Payer: Medicare HMO

## 2020-01-31 ENCOUNTER — Other Ambulatory Visit: Payer: Self-pay | Admitting: "Endocrinology

## 2020-03-18 ENCOUNTER — Telehealth: Payer: Self-pay | Admitting: Internal Medicine

## 2020-03-18 NOTE — Telephone Encounter (Signed)
Fine with me

## 2020-03-18 NOTE — Telephone Encounter (Signed)
Called and spoke with patient who would like to switch providers from Dr. Melvyn Novas. He is seen ad [place] office, I advised patient that there is only 2 other options at that office. He expressed understanding and said that he didn't have a preference.  Dr. Melvyn Novas are you ok with this?  Dr. Halford Chessman or Dr. Elsworth Soho would you see this patient?

## 2020-03-19 NOTE — Telephone Encounter (Signed)
Called and spoke with pt letting him know that Dr. Melvyn Novas was fine with him switching to a different pulmonologist and pt verbalized understanding. First avail 26min slot at [facility] office wasn't until 04/22/20 which pt was fine with. Pt has been scheduled for a new pt appt with VS 1/6. Nothing further needed.

## 2020-04-22 ENCOUNTER — Ambulatory Visit: Payer: Medicare HMO | Admitting: Pulmonary Disease

## 2020-04-27 ENCOUNTER — Telehealth: Payer: Self-pay

## 2020-04-27 ENCOUNTER — Other Ambulatory Visit: Payer: Self-pay | Admitting: "Endocrinology

## 2020-04-27 MED ORDER — VITAMIN D (ERGOCALCIFEROL) 1.25 MG (50000 UNIT) PO CAPS: 50000.0000 [IU] | ORAL_CAPSULE | ORAL | 0 refills | Status: DC

## 2020-04-27 NOTE — Telephone Encounter (Signed)
done

## 2020-04-27 NOTE — Telephone Encounter (Signed)
Please advise 

## 2020-04-27 NOTE — Telephone Encounter (Signed)
Pt requesting refill on Vitamin D, Ergocalciferol, (DRISDOL) 1.25 MG (50000 UNIT) CAPS capsule to be sent to [facility].

## 2020-05-10 ENCOUNTER — Other Ambulatory Visit: Payer: Self-pay | Admitting: "Endocrinology

## 2020-05-11 LAB — COMPLETE METABOLIC PANEL WITHOUT GFR
AG Ratio: 1.7 (calc) (ref 1.0–2.5)
ALT: 9 U/L (ref 9–46)
AST: 13 U/L (ref 10–35)
Albumin: 4.1 g/dL (ref 3.6–5.1)
Alkaline phosphatase (APISO): 114 U/L (ref 35–144)
BUN/Creatinine Ratio: 12 (calc) (ref 6–22)
BUN: 18 mg/dL (ref 7–25)
CO2: >45 mmol/L — ABNORMAL HIGH (ref 20–32)
Calcium: 9.7 mg/dL (ref 8.6–10.3)
Chloride: 84 mmol/L — ABNORMAL LOW (ref 98–110)
Creat: 1.54 mg/dL — ABNORMAL HIGH (ref 0.70–1.18)
GFR, Est African American: 52 mL/min/{1.73_m2} — ABNORMAL LOW
GFR, Est Non African American: 45 mL/min/{1.73_m2} — ABNORMAL LOW
Globulin: 2.4 g/dL (ref 1.9–3.7)
Glucose, Bld: 87 mg/dL (ref 65–99)
Potassium: 4.1 mmol/L (ref 3.5–5.3)
Sodium: 140 mmol/L (ref 135–146)
Total Bilirubin: 0.7 mg/dL (ref 0.2–1.2)
Total Protein: 6.5 g/dL (ref 6.1–8.1)

## 2020-05-12 ENCOUNTER — Ambulatory Visit: Payer: Medicare HMO | Admitting: Nurse Practitioner

## 2020-05-12 NOTE — Patient Instructions (Incomplete)

## 2020-05-17 ENCOUNTER — Ambulatory Visit (INDEPENDENT_AMBULATORY_CARE_PROVIDER_SITE_OTHER): Payer: Medicare HMO | Admitting: Nurse Practitioner

## 2020-05-17 ENCOUNTER — Other Ambulatory Visit: Payer: Self-pay

## 2020-05-17 ENCOUNTER — Encounter: Payer: Self-pay | Admitting: Nurse Practitioner

## 2020-05-17 VITALS — BP 136/86 | HR 93 | Ht 67.5 in | Wt 224.8 lb

## 2020-05-17 DIAGNOSIS — I10 Essential (primary) hypertension: Secondary | ICD-10-CM | POA: Diagnosis not present

## 2020-05-17 DIAGNOSIS — N1831 Chronic kidney disease, stage 3a: Secondary | ICD-10-CM

## 2020-05-17 DIAGNOSIS — E782 Mixed hyperlipidemia: Secondary | ICD-10-CM | POA: Diagnosis not present

## 2020-05-17 DIAGNOSIS — E1122 Type 2 diabetes mellitus with diabetic chronic kidney disease: Secondary | ICD-10-CM

## 2020-05-17 DIAGNOSIS — E559 Vitamin D deficiency, unspecified: Secondary | ICD-10-CM | POA: Diagnosis not present

## 2020-05-17 LAB — POCT GLYCOSYLATED HEMOGLOBIN (HGB A1C): HbA1c, POC (controlled diabetic range): 5.7 % (ref 0.0–7.0)

## 2020-05-17 MED ORDER — SITAGLIPTIN PHOSPHATE 100 MG PO TABS: 100.0000 mg | ORAL_TABLET | Freq: Every day | ORAL | 3 refills | Status: DC

## 2020-05-17 MED ORDER — SITAGLIPTIN PHOSPHATE 50 MG PO TABS: 50.0000 mg | ORAL_TABLET | Freq: Every day | ORAL | 3 refills | Status: DC

## 2020-05-17 NOTE — Progress Notes (Signed)
05/17/2020, 9:08 AM  Endocrinology follow-up note   Subjective:    Patient ID: Brian Parsons, adult    DOB: Oct 12, 1950.  Brian Parsons is being seen in follow-up after he was seen in consultation for management of currently uncontrolled symptomatic diabetes requested by  [name], MD.   Past Medical History:  Diagnosis Date  . Bilateral leg edema   . CHF (congestive heart failure) ([place])   . COPD (chronic obstructive pulmonary disease) ([place])   . Coronary atherosclerosis    Nonobstructive at cardiac catheterization April 2017  . Diabetes mellitus without complication ([place])   . Essential hypertension   . On home O2    2L N/C prn  . Polycythemia     Past Surgical History:  Procedure Laterality Date  . CARDIAC CATHETERIZATION N/A 08/10/2015   Procedure: Left Heart Cath and Coronary Angiography;  Surgeon: Troy Sine, MD;  Location: [facility];  Service: Cardiovascular;  Laterality: N/A;    Social History   Socioeconomic History  . Marital status: Divorced    Spouse name: Not on file  . Number of children: Not on file  . Years of education: Not on file  . Highest education level: Not on file  Occupational History  . Not on file  Tobacco Use  . Smoking status: Current Every Day Smoker    Packs/day: 1.00    Years: 50.00    Pack years: 50.00    Types: Cigarettes    Start date: 04/17/1961  . Smokeless tobacco: Never Used  Vaping Use  . Vaping Use: Never used  Substance and Sexual Activity  . Alcohol use: No    Alcohol/week: 0.0 standard drinks    Comment: No EtOH for 7 years  . Drug use: No  . Sexual activity: Yes  Other Topics Concern  . Not on file  Social History Narrative  . Not on file   Social Determinants of Health   Financial Resource Strain: Not on file  Food Insecurity: Not on file  Transportation Needs: Not on file  Physical Activity: Not on file   Stress: Not on file  Social Connections: Not on file    Family History  Problem Relation Age of Onset  . Stroke Mother   . Emphysema Father     Outpatient Encounter Medications as of 05/17/2020  Medication Sig  . albuterol (ACCUNEB) 0.63 MG/3ML nebulizer solution Take 3 mLs (0.63 mg total) by nebulization every 4 (four) hours as needed.  Marland Kitchen albuterol (VENTOLIN HFA) 108 (90 Base) MCG/ACT inhaler Inhale 2 puffs into the lungs every 6 (six) hours as needed for wheezing or shortness of breath.  . allopurinol (ZYLOPRIM) 100 MG tablet Take 200 mg by mouth 2 (two) times daily.   Marland Kitchen aspirin EC 81 MG tablet Take 81 mg by mouth daily.  Marland Kitchen atorvastatin (LIPITOR) 20 MG tablet Take 20 mg by mouth daily at 6 PM.   . Blood Glucose Monitoring Suppl (TRUE METRIX METER) DEVI See admin instructions.  . carvedilol (COREG) 3.125 MG tablet Take 1 tablet (3.125 mg total) by mouth 2 (two) times  daily with a meal.  . escitalopram (LEXAPRO) 20 MG tablet Take 20 mg by mouth daily.  . Fluticasone-Umeclidin-Vilant (TRELEGY ELLIPTA) 100-62.5-25 MCG/INH AEPB Inhale 1 puff into the lungs daily.  . Magnesium Gluconate 500 (27 Mg) MG TABS Take 500 mg by mouth in the morning and at bedtime.   . metolazone (ZAROXOLYN) 2.5 MG tablet Take 2.5 mg by mouth every other day.   . OXYGEN Inhale into the lungs. 2.5L at home use  . potassium chloride (MICRO-K) 10 MEQ CR capsule TAKE 1 CAPSULE BY MOUTH ONCE DAILY WITH FOOD  . sildenafil (REVATIO) 20 MG tablet 2-3  tablets 30 minutes-1 hour prior to sex  . torsemide (DEMADEX) 20 MG tablet Take 80 mg by mouth 2 (two) times daily.   . vitamin B-12 (CYANOCOBALAMIN) 1000 MCG tablet Take 1,000 mcg by mouth daily.  . Vitamin D, Ergocalciferol, (DRISDOL) 1.25 MG (50000 UNIT) CAPS capsule Take 1 capsule (50,000 Units total) by mouth once a week.  . [DISCONTINUED] Potassium 99 MG TABS Take 1 tablet by mouth daily.  . [DISCONTINUED] sitaGLIPtin (JANUVIA) 100 MG tablet Take 100 mg by mouth daily.   . sitaGLIPtin (JANUVIA) 50 MG tablet Take 1 tablet (50 mg total) by mouth daily.  . [DISCONTINUED] sitaGLIPtin (JANUVIA) 100 MG tablet Take 1 tablet (100 mg total) by mouth daily.   No facility-administered encounter medications on file as of 05/17/2020.    ALLERGIES: Allergies  Allergen Reactions  . Amoxicillin Hives  . Penicillins Hives    Has patient had a PCN reaction causing immediate rash, facial/tongue/throat swelling, SOB or lightheadedness with hypotension: Unknown Has patient had a PCN reaction causing severe rash involving mucus membranes or skin necrosis: No Has patient had a PCN reaction that required hospitalization No Has patient had a PCN reaction occurring within the last 10 years: No If all of the above answers are "NO", then may proceed with Cephalosporin use.     VACCINATION STATUS:  There is no immunization history on file for this patient.  Diabetes He presents for his follow-up diabetic visit. He has type 2 diabetes mellitus. Onset time: He was diagnosed few months ago at approximate age of 57 years. His disease course has been stable. There are no hypoglycemic associated symptoms. Pertinent negatives for hypoglycemia include no confusion, headaches, pallor or seizures. Associated symptoms include fatigue. Pertinent negatives for diabetes include no chest pain, no polydipsia, no polyphagia and no polyuria. There are no hypoglycemic complications. Symptoms are stable. Diabetic complications include heart disease and nephropathy. Risk factors for coronary artery disease include dyslipidemia, diabetes mellitus, family history, male sex, obesity, tobacco exposure, sedentary lifestyle and hypertension. Current diabetic treatment includes oral agent (monotherapy). He is compliant with treatment all of the time. His weight is fluctuating minimally. He is following a generally unhealthy diet. When asked about meal planning, he reported none. He has not had a previous visit  with a dietitian. He never participates in exercise. His home blood glucose trend is fluctuating minimally. His breakfast blood glucose range is generally 110-130 mg/dl. (He presents today with no meter or logs to review.  His POCT A1c today is 5.7%, improving slightly from previous visit of 5.9%.  He monitors his glucose every morning and is usual range is between 115-130.  He denies any hypoglycemia. ) He does not see a podiatrist.Eye exam is not current.  Hyperlipidemia This is a chronic problem. The current episode started more than 1 year ago. The problem is controlled. Recent lipid tests were  reviewed and are normal. Exacerbating diseases include chronic renal disease, diabetes and obesity. Factors aggravating his hyperlipidemia include fatty foods, smoking and beta blockers. Pertinent negatives include no chest pain, myalgias or shortness of breath. Current antihyperlipidemic treatment includes statins. The current treatment provides moderate improvement of lipids. There are no compliance problems.  Risk factors for coronary artery disease include diabetes mellitus, dyslipidemia, hypertension, male sex and a sedentary lifestyle.  Hypertension This is a chronic problem. The current episode started more than 1 year ago. The problem has been gradually improving since onset. The problem is controlled. Pertinent negatives include no chest pain, headaches, palpitations or shortness of breath. There are no associated agents to hypertension. Risk factors for coronary artery disease include diabetes mellitus, dyslipidemia, male gender, obesity, smoking/tobacco exposure and sedentary lifestyle. Past treatments include diuretics and beta blockers. The current treatment provides moderate improvement. There are no compliance problems.  Hypertensive end-organ damage includes kidney disease and heart failure. Identifiable causes of hypertension include chronic renal disease.    Review of systems  Constitutional: +  Minimally fluctuating body weight,  current Body mass index is 34.69 kg/m. , no fatigue, no subjective hyperthermia, no subjective hypothermia Eyes: no blurry vision, no xerophthalmia ENT: no sore throat, no nodules palpated in throat, no dysphagia/odynophagia, no hoarseness Cardiovascular: no chest pain, no shortness of breath, no palpitations, no leg swelling Respiratory: no cough, no shortness of breath Gastrointestinal: no nausea/vomiting/diarrhea Musculoskeletal: no muscle/joint aches, feels like he is getting a gout flare in left foot Skin: no rashes, no hyperemia, Neurological: no tremors, no numbness, no tingling, no dizziness Psychiatric: no depression, no anxiety   Objective:    BP 136/86 (BP Location: Left Arm)   Pulse 93   Ht 5' 7.5" (1.715 m)   Wt 224 lb 12.8 oz (102 kg)   BMI 34.69 kg/m   Wt Readings from Last 3 Encounters:  05/17/20 224 lb 12.8 oz (102 kg)  11/10/19 (!) 218 lb 9.6 oz (99.2 kg)  10/31/19 230 lb (104.3 kg)    BP Readings from Last 3 Encounters:  05/17/20 136/86  11/20/19 95/64  11/10/19 (!) 99/58     Physical Exam- Limited  Constitutional:  Body mass index is 34.69 kg/m. , not in acute distress, normal state of mind Eyes:  EOMI, no exophthalmos Neck: Supple Thyroid: No gross goiter Cardiovascular: RRR, no murmers, rubs, or gallops, no edema Respiratory: Adequate breathing efforts, no crackles, rales, rhonchi, or wheezing Musculoskeletal: no gross deformities, strength intact in all four extremities, no gross restriction of joint movements Skin:  no rashes, no hyperemia Neurological: no tremor with outstretched hands   Foot exam:   No rashes, ulcers, cuts, + calluses, + onychodystrophy bilaterally.   Dry scaly skin bilaterally  Good pulses bilat.  Good sensation to 10 g monofilament bilat.    POCT ABI Results 05/17/20   Right ABI:  1.35      Left ABI:  1.32  Right leg systolic / diastolic: [id number] mmHg Left leg systolic /  diastolic: [id number] mmHg  Arm systolic / diastolic: 123456 mmHG  Detailed report will be scanned into patient chart.    CMP ( most recent) CMP     Component Value Date/Time   NA 140 05/10/2020 1020   K 4.1 05/10/2020 1020   CL 84 (L) 05/10/2020 1020   CO2 >45 (H) 05/10/2020 1020   GLUCOSE 87 05/10/2020 1020   BUN 18 05/10/2020 1020   CREATININE 1.54 (H) 05/10/2020 1020   CALCIUM 9.7  05/10/2020 1020   PROT 6.5 05/10/2020 1020   ALBUMIN 3.8 12/29/2019 0816   AST 13 05/10/2020 1020   ALT 9 05/10/2020 1020   ALKPHOS 84 12/29/2019 0816   BILITOT 0.7 05/10/2020 1020   GFRNONAA 45 (L) 05/10/2020 1020   GFRAA 52 (L) 05/10/2020 1020     Diabetic Labs (most recent): Lab Results  Component Value Date   HGBA1C 5.7 05/17/2020   HGBA1C 5.9 (A) 11/10/2019   HGBA1C 6.6 (A) 07/08/2019     Lipid Panel ( most recent) Lipid Panel     Component Value Date/Time   CHOL 92 07/10/2019 1051   TRIG 134 07/10/2019 1051   HDL 28 (L) 07/10/2019 1051   CHOLHDL 3.3 07/10/2019 1051   VLDL 29 07/30/2015 0441   LDLCALC 42 07/10/2019 1051      Lab Results  Component Value Date   TSH 1.45 07/10/2019   TSH 1.098 08/03/2014   FREET4 1.1 07/10/2019      Assessment & Plan:   1) Type 2 diabetes mellitus with stage 3a chronic kidney disease, without long-term current use of insulin (HCC)  - Brian Parsons has currently uncontrolled symptomatic type 2 DM since  70 years of age.  He presents today with no meter or logs to review.  His POCT A1c today is 5.7%, improving slightly from previous visit of 5.9%.  He monitors his glucose every morning and is usual range is between 115-130.  He denies any hypoglycemia.  - He is only on Januvia 50 mg p.o. daily.    - I had a long discussion with him about the progressive nature of diabetes and the pathology behind its complications. -his diabetes is complicated by CKD, obesity/sedentary life, chronic heavy smoking and he remains at a high risk for more  acute and chronic complications which include CAD, CVA, CKD, retinopathy, and neuropathy. These are all discussed in detail with him.  - Nutritional counseling repeated at each appointment due to patients tendency to fall back in to old habits.  - The patient admits there is a room for improvement in their diet and drink choices. -  Suggestion is made for the patient to avoid simple carbohydrates from their diet including Cakes, Sweet Desserts / Pastries, Ice Cream, Soda (diet and regular), Sweet Tea, Candies, Chips, Cookies, Sweet Pastries,  Store Bought Juices, Alcohol in Excess of  1-2 drinks a day, Artificial Sweeteners, Coffee Creamer, and "Sugar-free" Products. This will help patient to have stable blood glucose profile and potentially avoid unintended weight gain.   - I encouraged the patient to switch to  unprocessed or minimally processed complex starch and increased protein intake (animal or plant source), fruits, and vegetables.   - Patient is advised to stick to a routine mealtimes to eat 3 meals  a day and avoid unnecessary snacks ( to snack only to correct hypoglycemia).  - I have approached him with the following individualized plan to manage  his diabetes and patient agrees:   -[facility] 5.[facility] %, he will not need any additional treatment at this time.    -He is advised to continue Januvia 50 mg p.o. daily at breakfast,  therapeutically suitable for patient .    - he is not a candidate for metformin, SGLT2 inhibitors due to concurrent renal insufficiency.  -He will not be considered for GLP-1 receptor agonists due to his heavy smoking history.  - Specific targets for  A1c;  LDL,  HDL, Triglycerides, and  Waist Circumference were discussed with the patient.  2) Blood Pressure /Hypertension:  His blood pressure is controlled to target.   he is advised to continue his current medications including Carvedilol 3.125 mg p.o. nightly,  Metolazone 2.5 mg QOD, Torsemide 80 mg po BID.    3) Lipids/Hyperlipidemia:    Review of his recent lipid panel from 3/25/21showed  controlled LDL at 42 .  he is advised to continue Atorvastatin 20 mg p.o. daily at bedtime.  Side effects and precautions discussed with him.  4)  Weight/Diet:  His Body mass index is 34.69 kg/m.  -   clearly complicating his diabetes care.   he is  a candidate for weight loss. I discussed with him the fact that loss of 5 - 10% of his  current body weight will have the most impact on his diabetes management.  Exercise, and detailed carbohydrates information provided  -  detailed on discharge instructions.  5) Vitamin D deficiency:  His most recent vitamin D level was 44.3 on 12/29/19.  He is currently still taking Ergocalciferol 50,000 units weekly.  6) Chronic Care/Health Maintenance: -he  is on Statin medications and is encouraged to initiate and continue to follow up with Ophthalmology, Dentist, Podiatrist at least yearly or according to recommendations, and advised to  quit smoking. I have recommended yearly flu vaccine and pneumonia vaccine at least every 5 years; moderate intensity exercise for up to 150 minutes weekly; and  sleep for at least 7 hours a day.  -He is counseled extensively for smoking cessation.  - he is  advised to maintain close follow up with [name], MD for primary care needs, as well as his other providers for optimal and coordinated care.  - Time spent on this patient care encounter:  30 min, of which > 50% was spent in  counseling and the rest reviewing his blood glucose logs , discussing his hypoglycemia and hyperglycemia episodes, reviewing his current and  previous labs / studies  ( including abstraction from other facilities) and medications  doses and developing a  long term treatment plan and documenting his care.   Please refer to Patient Instructions for Blood Glucose Monitoring and Insulin/Medications Dosing Guide"  in media  tab for additional information. Please  also refer to " Patient Self Inventory" in the Media  tab for reviewed elements of pertinent patient history.  Brian Parsons participated in the discussions, expressed understanding, and voiced agreement with the above plans.  All questions were answered to his satisfaction. he is encouraged to contact clinic should he have any questions or concerns prior to his return visit.   Follow up plan: - Return in about 6 months (around 11/14/2020) for Diabetes follow up- A1c and urine micro in office, Previsit labs, Bring glucometer and logs.  Rayetta Pigg, [facility] [address] [place], [place] [address] Phone: [phone] Fax: [phone]  05/17/2020, 9:08 AM

## 2020-05-17 NOTE — Patient Instructions (Signed)

## 2020-05-19 ENCOUNTER — Ambulatory Visit: Payer: Medicare HMO | Admitting: Pulmonary Disease

## 2020-09-15 ENCOUNTER — Other Ambulatory Visit: Payer: Self-pay | Admitting: Internal Medicine

## 2020-09-20 ENCOUNTER — Other Ambulatory Visit: Payer: Self-pay | Admitting: "Endocrinology

## 2020-10-25 ENCOUNTER — Other Ambulatory Visit: Payer: Self-pay | Admitting: Internal Medicine

## 2020-11-12 LAB — COMPREHENSIVE METABOLIC PANEL
ALT: 13 IU/L (ref 0–44)
AST: 17 IU/L (ref 0–40)
Albumin/Globulin Ratio: 1.7 (ref 1.2–2.2)
Albumin: 4.3 g/dL (ref 3.7–4.7)
Alkaline Phosphatase: 106 IU/L (ref 44–121)
BUN/Creatinine Ratio: 20 (ref 10–24)
BUN: 35 mg/dL — ABNORMAL HIGH (ref 8–27)
Bilirubin Total: 0.6 mg/dL (ref 0.0–1.2)
CO2: 42 mmol/L (ref 20–29)
Calcium: 10.1 mg/dL (ref 8.6–10.2)
Chloride: 82 mmol/L — ABNORMAL LOW (ref 96–106)
Creatinine, Ser: 1.76 mg/dL — ABNORMAL HIGH (ref 0.76–1.27)
Globulin, Total: 2.5 g/dL (ref 1.5–4.5)
Glucose: 90 mg/dL (ref 65–99)
Potassium: 4.2 mmol/L (ref 3.5–5.2)
Sodium: 138 mmol/L (ref 134–144)
Total Protein: 6.8 g/dL (ref 6.0–8.5)
eGFR: 41 mL/min/{1.73_m2} — ABNORMAL LOW (ref >59)

## 2020-11-12 LAB — TSH: TSH: 1.56 u[IU]/mL (ref 0.450–4.500)

## 2020-11-12 LAB — LIPID PANEL
Chol/HDL Ratio: 3.2 ratio (ref 0.0–5.0)
Cholesterol, Total: 106 mg/dL (ref 100–199)
HDL: 33 mg/dL — ABNORMAL LOW (ref >39)
LDL Chol Calc (NIH): 51 mg/dL (ref 0–99)
Triglycerides: 118 mg/dL (ref 0–149)
VLDL Cholesterol Cal: 22 mg/dL (ref 5–40)

## 2020-11-12 LAB — T4, FREE: Free T4: 1.2 ng/dL (ref 0.82–1.77)

## 2020-11-12 NOTE — Patient Instructions (Signed)

## 2020-11-15 ENCOUNTER — Other Ambulatory Visit: Payer: Self-pay

## 2020-11-15 ENCOUNTER — Encounter: Payer: Self-pay | Admitting: Nurse Practitioner

## 2020-11-15 ENCOUNTER — Ambulatory Visit (INDEPENDENT_AMBULATORY_CARE_PROVIDER_SITE_OTHER): Payer: Medicare HMO | Admitting: Nurse Practitioner

## 2020-11-15 VITALS — BP 123/70 | HR 78 | Ht 67.5 in | Wt 217.2 lb

## 2020-11-15 DIAGNOSIS — E559 Vitamin D deficiency, unspecified: Secondary | ICD-10-CM | POA: Diagnosis not present

## 2020-11-15 DIAGNOSIS — E782 Mixed hyperlipidemia: Secondary | ICD-10-CM

## 2020-11-15 DIAGNOSIS — E1122 Type 2 diabetes mellitus with diabetic chronic kidney disease: Secondary | ICD-10-CM

## 2020-11-15 DIAGNOSIS — N1832 Chronic kidney disease, stage 3b: Secondary | ICD-10-CM

## 2020-11-15 DIAGNOSIS — F17200 Nicotine dependence, unspecified, uncomplicated: Secondary | ICD-10-CM

## 2020-11-15 DIAGNOSIS — I10 Essential (primary) hypertension: Secondary | ICD-10-CM

## 2020-11-15 MED ORDER — SITAGLIPTIN PHOSPHATE 50 MG PO TABS: 50.0000 mg | ORAL_TABLET | Freq: Every day | ORAL | 3 refills | Status: DC

## 2020-11-15 NOTE — Progress Notes (Signed)
11/15/2020, 9:17 AM  Endocrinology follow-up note   Subjective:    Patient ID: Brian Parsons, adult    DOB: 11/02/1950.  Brian Parsons is being seen in follow-up after he was seen in consultation for management of currently uncontrolled symptomatic diabetes requested by  Brian Schwalbe, MD.   Past Medical History:  Diagnosis Date   Bilateral leg edema    CHF (congestive heart failure) ([place])    COPD (chronic obstructive pulmonary disease) ([place])    Coronary atherosclerosis    Nonobstructive at cardiac catheterization April 2017   Diabetes mellitus without complication ([place])    Essential hypertension    On home O2    2L N/C prn   Polycythemia     Past Surgical History:  Procedure Laterality Date   CARDIAC CATHETERIZATION N/A 08/10/2015   Procedure: Left Heart Cath and Coronary Angiography;  Surgeon: Troy Sine, MD;  Location: [facility];  Service: Cardiovascular;  Laterality: N/A;    Social History   Socioeconomic History   Marital status: Divorced    Spouse name: Not on file   Number of children: Not on file   Years of education: Not on file   Highest education level: Not on file  Occupational History   Not on file  Tobacco Use   Smoking status: Every Day    Packs/day: 1.00    Years: 50.00    Pack years: 50.00    Types: Cigarettes    Start date: 04/17/1961   Smokeless tobacco: Never  Vaping Use   Vaping Use: Never used  Substance and Sexual Activity   Alcohol use: No    Alcohol/week: 0.0 standard drinks    Comment: No EtOH for 7 years   Drug use: No   Sexual activity: Yes  Other Topics Concern   Not on file  Social History Narrative   Not on file   Social Determinants of Health   Financial Resource Strain: Not on file  Food Insecurity: Not on file  Transportation Needs: Not on file  Physical Activity: Not on file  Stress: Not on file  Social Connections:  Not on file    Family History  Problem Relation Age of Onset   Stroke Mother    Emphysema Father     Outpatient Encounter Medications as of 11/15/2020  Medication Sig   albuterol (ACCUNEB) 0.63 MG/3ML nebulizer solution Take 3 mLs (0.63 mg total) by nebulization every 4 (four) hours as needed.   albuterol (VENTOLIN HFA) 108 (90 Base) MCG/ACT inhaler Inhale 2 puffs into the lungs every 6 (six) hours as needed for wheezing or shortness of breath.   allopurinol (ZYLOPRIM) 100 MG tablet Take 200 mg by mouth 2 (two) times daily.    aspirin EC 81 MG tablet Take 81 mg by mouth daily.   atorvastatin (LIPITOR) 20 MG tablet Take 20 mg by mouth daily at 6 PM.    Blood Glucose Monitoring Suppl (TRUE METRIX METER) DEVI See admin instructions.   carvedilol (COREG) 3.125 MG tablet Take 1 tablet (3.125 mg total) by mouth 2 (two) times daily with a  meal.   escitalopram (LEXAPRO) 20 MG tablet Take 20 mg by mouth daily.   Magnesium Gluconate 500 (27 Mg) MG TABS Take 500 mg by mouth in the morning and at bedtime.    metolazone (ZAROXOLYN) 2.5 MG tablet Take 2.5 mg by mouth every other day.    OXYGEN Inhale into the lungs. 2.5L at home use   potassium chloride (MICRO-K) 10 MEQ CR capsule TAKE 1 CAPSULE BY MOUTH ONCE DAILY WITH FOOD   sildenafil (REVATIO) 20 MG tablet 2-3  tablets 30 minutes-1 hour prior to sex   sitaGLIPtin (JANUVIA) 50 MG tablet Take 1 tablet (50 mg total) by mouth daily.   torsemide (DEMADEX) 20 MG tablet Take 80 mg by mouth 2 (two) times daily.    TRELEGY ELLIPTA 100-62.5-25 MCG/INH AEPB INHALE 1 PUFF ONCE DAILY.   vitamin B-12 (CYANOCOBALAMIN) 1000 MCG tablet Take 1,000 mcg by mouth daily.   Vitamin D, Ergocalciferol, (DRISDOL) 1.25 MG (50000 UNIT) CAPS capsule TAKE 1 CAPSULE BY MOUTH ONCE A WEEK   [DISCONTINUED] sitaGLIPtin (JANUVIA) 50 MG tablet Take 1 tablet (50 mg total) by mouth daily.   No facility-administered encounter medications on file as of 11/15/2020.     ALLERGIES: Allergies  Allergen Reactions   Amoxicillin Hives   Penicillins Hives    Has patient had a PCN reaction causing immediate rash, facial/tongue/throat swelling, SOB or lightheadedness with hypotension: Unknown Has patient had a PCN reaction causing severe rash involving mucus membranes or skin necrosis: No Has patient had a PCN reaction that required hospitalization No Has patient had a PCN reaction occurring within the last 10 years: No If all of the above answers are "NO", then may proceed with Cephalosporin use.     VACCINATION STATUS:  There is no immunization history on file for this patient.  Diabetes He presents for his follow-up diabetic visit. He has type 2 diabetes mellitus. Onset time: He was diagnosed few months ago at approximate age of 58 years. His disease course has been stable. There are no hypoglycemic associated symptoms. Pertinent negatives for hypoglycemia include no confusion, headaches, pallor or seizures. Associated symptoms include fatigue. Pertinent negatives for diabetes include no chest pain, no polydipsia, no polyphagia and no polyuria. There are no hypoglycemic complications. Symptoms are stable. Diabetic complications include heart disease and nephropathy. Risk factors for coronary artery disease include dyslipidemia, diabetes mellitus, family history, male sex, obesity, tobacco exposure, sedentary lifestyle and hypertension. Current diabetic treatment includes oral agent (monotherapy). He is compliant with treatment all of the time. His weight is fluctuating minimally. He is following a generally unhealthy diet. When asked about meal planning, he reported none. He has not had a previous visit with a dietitian. He never participates in exercise. His home blood glucose trend is fluctuating minimally. His breakfast blood glucose range is generally 110-130 mg/dl. (He presents today with his meter and logs showing stable glycemic profile overall.  He does  not routinely monitor glucose due to monotherapy with Januvia only.  His previsit A1c at his PCP office on 10/15/20 was 5.4%, stable.  He denies any hypoglycemia. ) He does not see a podiatrist.Eye exam is not current.  Hyperlipidemia This is a chronic problem. The current episode started more than 1 year ago. The problem is controlled. Recent lipid tests were reviewed and are normal. Exacerbating diseases include chronic renal disease, diabetes and obesity. Factors aggravating his hyperlipidemia include fatty foods, smoking and beta blockers. Pertinent negatives include no chest pain, myalgias or shortness of breath.  Current antihyperlipidemic treatment includes statins. The current treatment provides moderate improvement of lipids. There are no compliance problems.  Risk factors for coronary artery disease include diabetes mellitus, dyslipidemia, hypertension, male sex and a sedentary lifestyle.  Hypertension This is a chronic problem. The current episode started more than 1 year ago. The problem has been gradually improving since onset. The problem is controlled. Pertinent negatives include no chest pain, headaches, palpitations or shortness of breath. There are no associated agents to hypertension. Risk factors for coronary artery disease include diabetes mellitus, dyslipidemia, male gender, obesity, smoking/tobacco exposure and sedentary lifestyle. Past treatments include diuretics and beta blockers. The current treatment provides moderate improvement. There are no compliance problems.  Hypertensive end-organ damage includes kidney disease and heart failure. Identifiable causes of hypertension include chronic renal disease.     Review of systems  Constitutional: + Minimally fluctuating body weight,  current Body mass index is 33.52 kg/m. , no fatigue, no subjective hyperthermia, no subjective hypothermia Eyes: no blurry vision, no xerophthalmia ENT: no sore throat, no nodules palpated in throat, no  dysphagia/odynophagia, no hoarseness Cardiovascular: no chest pain, no shortness of breath, no palpitations, no leg swelling Respiratory: + URI symptoms- has seen his PCP for this-improving Gastrointestinal: no nausea/vomiting/diarrhea Musculoskeletal: no muscle/joint aches Skin: no rashes, no hyperemia Neurological: no tremors, no numbness, no tingling, no dizziness Psychiatric: no depression, no anxiety   Objective:    BP 123/70   Pulse 78   Ht 5' 7.5" (1.715 m)   Wt 217 lb 3.2 oz (98.5 kg)   BMI 33.52 kg/m   Wt Readings from Last 3 Encounters:  11/15/20 217 lb 3.2 oz (98.5 kg)  05/17/20 224 lb 12.8 oz (102 kg)  11/10/19 (!) 218 lb 9.6 oz (99.2 kg)    BP Readings from Last 3 Encounters:  11/15/20 123/70  05/17/20 136/86  11/20/19 95/64     Physical Exam- Limited  Constitutional:  Body mass index is 33.52 kg/m. , not in acute distress, normal state of mind Eyes:  EOMI, no exophthalmos Neck: Supple Cardiovascular: RRR, no murmurs, rubs, or gallops, no edema Respiratory: Adequate breathing efforts, no crackles, rales, rhonchi, or wheezing Musculoskeletal: no gross deformities, strength intact in all four extremities, no gross restriction of joint movements Skin:  no rashes, no hyperemia Neurological: no tremor with outstretched hands     CMP ( most recent) CMP     Component Value Date/Time   NA 138 11/11/2020 0946   K 4.2 11/11/2020 0946   CL 82 (L) 11/11/2020 0946   CO2 42 (HH) 11/11/2020 0946   GLUCOSE 90 11/11/2020 0946   GLUCOSE 87 05/10/2020 1020   BUN 35 (H) 11/11/2020 0946   CREATININE 1.76 (H) 11/11/2020 0946   CREATININE 1.54 (H) 05/10/2020 1020   CALCIUM 10.1 11/11/2020 0946   PROT 6.8 11/11/2020 0946   ALBUMIN 4.3 11/11/2020 0946   AST 17 11/11/2020 0946   ALT 13 11/11/2020 0946   ALKPHOS 106 11/11/2020 0946   BILITOT 0.6 11/11/2020 0946   GFRNONAA 45 (L) 05/10/2020 1020   GFRAA 52 (L) 05/10/2020 1020     Diabetic Labs (most  recent): Lab Results  Component Value Date   HGBA1C 5.7 05/17/2020   HGBA1C 5.9 (A) 11/10/2019   HGBA1C 6.6 (A) 07/08/2019     Lipid Panel ( most recent) Lipid Panel     Component Value Date/Time   CHOL 106 11/11/2020 0946   TRIG 118 11/11/2020 0946   HDL 33 (L) 11/11/2020 [id number]  CHOLHDL 3.2 11/11/2020 0946   CHOLHDL 3.3 07/10/2019 1051   VLDL 29 07/30/2015 0441   LDLCALC 51 11/11/2020 0946   LDLCALC 42 07/10/2019 1051   LABVLDL 22 11/11/2020 0946      Lab Results  Component Value Date   TSH 1.560 11/11/2020   TSH 1.45 07/10/2019   TSH 1.098 08/03/2014   FREET4 1.20 11/11/2020   FREET4 1.1 07/10/2019      Assessment & Plan:   1) Type 2 diabetes mellitus with stage 3a chronic kidney disease, without long-term current use of insulin (HCC)  - Brian Parsons has currently uncontrolled symptomatic type 2 DM since  70 years of age.  He presents today with his meter and logs showing stable glycemic profile overall.  He does not routinely monitor glucose due to monotherapy with Januvia only.  His previsit A1c at his PCP office on 10/15/20 was 5.4%, stable.  He denies any hypoglycemia.  - I had a long discussion with him about the progressive nature of diabetes and the pathology behind its complications. -his diabetes is complicated by CKD, obesity/sedentary life, chronic heavy smoking and he remains at a high risk for more acute and chronic complications which include CAD, CVA, CKD, retinopathy, and neuropathy. These are all discussed in detail with him.  - Nutritional counseling repeated at each appointment due to patients tendency to fall back in to old habits.  - The patient admits there is a room for improvement in their diet and drink choices. -  Suggestion is made for the patient to avoid simple carbohydrates from their diet including Cakes, Sweet Desserts / Pastries, Ice Cream, Soda (diet and regular), Sweet Tea, Candies, Chips, Cookies, Sweet Pastries, Store Bought  Juices, Alcohol in Excess of 1-2 drinks a day, Artificial Sweeteners, Coffee Creamer, and "Sugar-free" Products. This will help patient to have stable blood glucose profile and potentially avoid unintended weight gain.   - I encouraged the patient to switch to unprocessed or minimally processed complex starch and increased protein intake (animal or plant source), fruits, and vegetables.   - Patient is advised to stick to a routine mealtimes to eat 3 meals a day and avoid unnecessary snacks (to snack only to correct hypoglycemia).  - I have approached him with the following individualized plan to manage  his diabetes and patient agrees:   -Based on his current response to [facility] treatment with A1c of 5.7 %, he will not need any additional treatment at this time.    -He is advised to continue Januvia 50 mg p.o. daily at breakfast,  therapeutically suitable for patient .    - he is not a candidate for metformin, SGLT2 inhibitors due to concurrent renal insufficiency.  -He will not be considered for GLP-1 receptor agonists due to his heavy smoking history.  - Specific targets for  A1c;  LDL, HDL, Triglycerides, and  Waist Circumference were discussed with the patient.  2) Blood Pressure /Hypertension:  His blood pressure is controlled to target.   he is advised to continue his current medications including Carvedilol 3.125 mg p.o. nightly, Metolazone 2.5 mg QOD, Torsemide 80 mg po BID.    3) Lipids/Hyperlipidemia:    Review of his recent lipid panel from 11/11/20 showed controlled LDL at 51 .  he is advised to continue Atorvastatin 20 mg p.o. daily at bedtime.  Side effects and precautions discussed with him.  4)  Weight/Diet:  His Body mass index is 33.52 kg/m.  -   clearly complicating his  diabetes care.   he is  a candidate for weight loss. I discussed with him the fact that loss of 5 - 10% of his  current body weight will have the most impact on his diabetes management.  Exercise, and  detailed carbohydrates information provided  -  detailed on discharge instructions.  5) Vitamin D deficiency:  His most recent vitamin D level was 44.3 on 12/29/19.  He is currently still taking Ergocalciferol 50,000 units weekly.  6) Chronic Care/Health Maintenance: -he is on Statin medications and is encouraged to initiate and continue to follow up with Ophthalmology, Dentist, Podiatrist at least yearly or according to recommendations, and advised to  quit smoking. I have recommended yearly flu vaccine and pneumonia vaccine at least every 5 years; moderate intensity exercise for up to 150 minutes weekly; and  sleep for at least 7 hours a day.  -He is counseled extensively for smoking cessation.  - he is advised to maintain close follow up with Brian Schwalbe, MD for primary care needs, as well as his other providers for optimal and coordinated care.    I spent 30 minutes in the care of the patient today including review of labs from [place], Lipids, Thyroid Function, Hematology (current and previous including abstractions from other facilities); face-to-face time discussing  his blood glucose readings/logs, discussing hypoglycemia and hyperglycemia episodes and symptoms, medications doses, his options of short and long term treatment based on the latest standards of care / guidelines;  discussion about incorporating lifestyle medicine;  and documenting the encounter.    Please refer to Patient Instructions for Blood Glucose Monitoring and Insulin/Medications Dosing Guide"  in media tab for additional information. Please  also refer to " Patient Self Inventory" in the Media  tab for reviewed elements of pertinent patient history.  Brian Parsons participated in the discussions, expressed understanding, and voiced agreement with the above plans.  All questions were answered to his satisfaction. he is encouraged to contact clinic should he have any questions or concerns prior to his return  visit.   Follow up plan: - Return in about 6 months (around 05/18/2021) for Diabetes F/U- A1c and UM in office, No previsit labs.  Brian Parsons, [facility] [address] [place], [place] [address] Phone: [phone] Fax: [phone]  11/15/2020, 9:17 AM

## 2021-01-14 ENCOUNTER — Other Ambulatory Visit: Payer: Self-pay | Admitting: "Endocrinology

## 2021-01-17 NOTE — Telephone Encounter (Signed)
Last Vitamin D lab was 12/29/2019  Please advise if okay to continue this dosage of Vitamin D.  Last OV 11/15/2020  Next OV 05/18/2021  Last filled 09/20/2020 # 12 with 0 refills

## 2021-01-18 ENCOUNTER — Telehealth: Payer: Self-pay | Admitting: Nurse Practitioner

## 2021-01-18 DIAGNOSIS — E1122 Type 2 diabetes mellitus with diabetic chronic kidney disease: Secondary | ICD-10-CM

## 2021-01-18 DIAGNOSIS — E559 Vitamin D deficiency, unspecified: Secondary | ICD-10-CM

## 2021-01-18 NOTE — Telephone Encounter (Signed)
Pt requesting a refill   Vitamin D, Ergocalciferol, (DRISDOL) 1.25 MG (50000 UNIT) CAPS capsule   [place], [place] Phone:  [phone]  Fax:  [phone]

## 2021-01-19 NOTE — Telephone Encounter (Signed)
Pt informed to get labs done

## 2021-01-19 NOTE — Telephone Encounter (Signed)
done

## 2021-01-25 ENCOUNTER — Ambulatory Visit: Payer: Medicare HMO | Admitting: Internal Medicine

## 2021-01-25 NOTE — Progress Notes (Deleted)
Brian Parsons, adult    DOB: [birth year]      MRN: [id number]   Brief patient profile:  44 yowm active smoker followed previously by Dr Luan Pulling with copd c/b chronic hypoxemia / polycythemia and referred to pulmonary clinic 10/14/2019 by Dr   Bartolo Darter in [place]      History of Present Illness  10/14/2019  Pulmonary/ 1st office eval/Brian Parsons  - not vaccinated  Chief Complaint  Patient presents with   Pulmonary Consult    Referred by Dr [name]. Former pt of Dr Luan Pulling. He uses o2 with sleep and as needed during the day. He states has good and bad days with his breathing. Smokes 1/2 ppd. He is using albuterol inhaler 2 x per wk and neb about 2 x per day.   Dyspnea:  No trouble up and down aisles at [employer] [facility] parking  s 02 but never checks it Cough: minimal am cough x 30 min >  1-2 tsp slt green better p abx  Sleep: 30 degrees with flat bed / pillows  SABA use: too much neb rx  02  2.5 hs  Rec Plan A = Automatic = Always=     trelegy one click each  Plan B = Backup (to supplement plan A, not to replace it) Only use your albuterol inhaler as a rescue medication Plan C = Crisis (instead of Plan B but only if Plan B stops working) - only use your albuterol nebulizer if you first try Plan B and it fails to help  Try albuterol 15 min before an activity that you know would make you short of breath     The key is to stop smoking completely before smoking completely stops you! I strongly urge you to get the moderna or pfizer vaccine asap based on proven safety and effectiveness  We will set you up for an overnight 02 level on your oxygen We need your [facility] records for your PFTs done here in this office     01/25/2021  f/u ov/[place] office/Brian Parsons re: copd GOLD? Hypoxemic and hypercarbcic maint on ***  No chief complaint on file.   Dyspnea:  *** Cough: *** Sleeping: *** SABA use: *** 02: *** Covid status: *** Lung cancer screening: ***   No obvious day to day or daytime  variability or assoc excess/ purulent sputum or mucus plugs or hemoptysis or cp or chest tightness, subjective wheeze or overt sinus or hb symptoms.   *** without nocturnal  or early am exacerbation  of respiratory  c/o's or need for noct saba. Also denies any obvious fluctuation of symptoms with weather or environmental changes or other aggravating or alleviating factors except as outlined above   No unusual exposure hx or h/o childhood pna/ asthma or knowledge of premature birth.  Current Allergies, Complete Past Medical History, Past Surgical History, Family History, and Social History were reviewed in [employer] record.  ROS  The following are not active complaints unless bolded Hoarseness, sore throat, dysphagia, dental problems, itching, sneezing,  nasal congestion or discharge of excess mucus or purulent secretions, ear ache,   fever, chills, sweats, unintended wt loss or wt gain, classically pleuritic or exertional cp,  orthopnea pnd or arm/hand swelling  or leg swelling, presyncope, palpitations, abdominal pain, anorexia, nausea, vomiting, diarrhea  or change in bowel habits or change in bladder habits, change in stools or change in urine, dysuria, hematuria,  rash, arthralgias, visual complaints, headache, numbness, weakness or ataxia or  problems with walking or coordination,  change in mood or  memory.        No outpatient medications have been marked as taking for the 01/25/21 encounter (Appointment) with Tanda Rockers, MD.                   Past Medical History:  Diagnosis Date   Bilateral leg edema    CHF (congestive heart failure) (HCC)    COPD (chronic obstructive pulmonary disease) ([place])    Coronary atherosclerosis    Nonobstructive at cardiac catheterization April 2017   Diabetes mellitus without complication ([place])    Essential hypertension    On home O2    2L N/C prn   Polycythemia        Objective:     Wt Readings from Last 3  Encounters:  11/15/20 217 lb 3.2 oz (98.5 kg)  05/17/20 224 lb 12.8 oz (102 kg)  11/10/19 (!) 218 lb 9.6 oz (99.2 kg)      Vital signs reviewed  01/25/2021  - Note at rest 02 sats  ***% on ***   General appearance:    ***       Mild bar  1+ sym lower Ext edema L slt  > R ***              Assessment

## 2021-02-10 LAB — COMPREHENSIVE METABOLIC PANEL
ALT: 13 IU/L (ref 0–44)
AST: 25 IU/L (ref 0–40)
Albumin/Globulin Ratio: 1.7 (ref 1.2–2.2)
Albumin: 4.6 g/dL (ref 3.7–4.7)
Alkaline Phosphatase: 117 IU/L (ref 44–121)
BUN/Creatinine Ratio: 32 — ABNORMAL HIGH (ref 10–24)
BUN: 63 mg/dL — ABNORMAL HIGH (ref 8–27)
Bilirubin Total: 0.6 mg/dL (ref 0.0–1.2)
CO2: 40 mmol/L — ABNORMAL HIGH (ref 20–29)
Calcium: 10.2 mg/dL (ref 8.6–10.2)
Chloride: 78 mmol/L — ABNORMAL LOW (ref 96–106)
Creatinine, Ser: 2 mg/dL — ABNORMAL HIGH (ref 0.76–1.27)
Globulin, Total: 2.7 g/dL (ref 1.5–4.5)
Glucose: 151 mg/dL — ABNORMAL HIGH (ref 70–99)
Potassium: 3.6 mmol/L (ref 3.5–5.2)
Sodium: 137 mmol/L (ref 134–144)
Total Protein: 7.3 g/dL (ref 6.0–8.5)
eGFR: 35 mL/min/{1.73_m2} — ABNORMAL LOW (ref >59)

## 2021-02-10 LAB — VITAMIN D 25 HYDROXY (VIT D DEFICIENCY, FRACTURES): Vit D, 25-Hydroxy: 45.3 ng/mL (ref 30.0–100.0)

## 2021-02-11 ENCOUNTER — Telehealth: Payer: Self-pay

## 2021-02-11 NOTE — Telephone Encounter (Signed)
Called patient and gave him this message. Patient verbalized an understanding and thanked [place].

## 2021-02-11 NOTE — Telephone Encounter (Signed)
No, he does not need the prescription vitamin D at this time since his vitamin d level has corrected.  He can start OTC Vitamin D3 5000 units po daily to maintain his current level.

## 2021-02-11 NOTE — Telephone Encounter (Signed)
Patient called and wanted to know if he needs to continue his Vitamin D 50000 because he is out and wanted to know if you have reviewed his lab results.

## 2021-02-25 ENCOUNTER — Inpatient Hospital Stay (HOSPITAL_COMMUNITY): Payer: Medicare HMO

## 2021-02-25 DIAGNOSIS — D751 Secondary polycythemia: Secondary | ICD-10-CM

## 2021-02-28 ENCOUNTER — Other Ambulatory Visit: Payer: Self-pay

## 2021-02-28 ENCOUNTER — Inpatient Hospital Stay (HOSPITAL_COMMUNITY): Payer: Medicare HMO | Attending: Hematology

## 2021-02-28 DIAGNOSIS — D751 Secondary polycythemia: Secondary | ICD-10-CM | POA: Diagnosis not present

## 2021-02-28 LAB — CBC WITH DIFFERENTIAL/PLATELET
Abs Immature Granulocytes: 0.05 10*3/uL (ref 0.00–0.07)
Basophils Absolute: 0.1 10*3/uL (ref 0.0–0.1)
Basophils Relative: 1 %
Eosinophils Absolute: 0.1 10*3/uL (ref 0.0–0.5)
Eosinophils Relative: 2 %
HCT: 61.3 % — ABNORMAL HIGH (ref 39.0–52.0)
Hemoglobin: 17.7 g/dL — ABNORMAL HIGH (ref 13.0–17.0)
Immature Granulocytes: 1 %
Lymphocytes Relative: 22 %
Lymphs Abs: 2.1 10*3/uL (ref 0.7–4.0)
MCH: 24.6 pg — ABNORMAL LOW (ref 26.0–34.0)
MCHC: 28.9 g/dL — ABNORMAL LOW (ref 30.0–36.0)
MCV: 85.1 fL (ref 80.0–100.0)
Monocytes Absolute: 1.1 10*3/uL — ABNORMAL HIGH (ref 0.1–1.0)
Monocytes Relative: 12 %
Neutro Abs: 5.9 10*3/uL (ref 1.7–7.7)
Neutrophils Relative %: 62 %
Platelets: 149 10*3/uL — ABNORMAL LOW (ref 150–400)
RBC: 7.2 MIL/uL — ABNORMAL HIGH (ref 4.22–5.81)
RDW: 21 % — ABNORMAL HIGH (ref 11.5–15.5)
WBC: 9.3 10*3/uL (ref 4.0–10.5)
nRBC: 0 % (ref 0.0–0.2)

## 2021-03-02 NOTE — Progress Notes (Deleted)
CANCEL 

## 2021-03-03 ENCOUNTER — Encounter (HOSPITAL_COMMUNITY): Payer: Medicare HMO

## 2021-03-03 ENCOUNTER — Ambulatory Visit (HOSPITAL_COMMUNITY): Payer: Medicare HMO | Admitting: Physician Assistant

## 2021-03-11 ENCOUNTER — Ambulatory Visit (INDEPENDENT_AMBULATORY_CARE_PROVIDER_SITE_OTHER): Payer: Medicare HMO | Admitting: Internal Medicine

## 2021-03-11 ENCOUNTER — Encounter: Payer: Self-pay | Admitting: Internal Medicine

## 2021-03-11 ENCOUNTER — Other Ambulatory Visit: Payer: Self-pay

## 2021-03-11 VITALS — BP 136/80 | HR 65 | Temp 98.2°F | Ht 67.0 in | Wt 215.0 lb

## 2021-03-11 DIAGNOSIS — J449 Chronic obstructive pulmonary disease, unspecified: Secondary | ICD-10-CM

## 2021-03-11 DIAGNOSIS — F17210 Nicotine dependence, cigarettes, uncomplicated: Secondary | ICD-10-CM

## 2021-03-11 DIAGNOSIS — J9611 Chronic respiratory failure with hypoxia: Secondary | ICD-10-CM

## 2021-03-11 DIAGNOSIS — J9612 Chronic respiratory failure with hypercapnia: Secondary | ICD-10-CM

## 2021-03-11 DIAGNOSIS — R0609 Other forms of dyspnea: Secondary | ICD-10-CM

## 2021-03-11 MED ORDER — TRELEGY ELLIPTA 100-62.5-25 MCG/ACT IN AEPB: INHALATION_SPRAY | RESPIRATORY_TRACT | 11 refills | Status: DC

## 2021-03-11 NOTE — Assessment & Plan Note (Signed)
HC03  07/24/19     43  HC03 02/09/21  33  - 03/11/2021   Walked on 3lpm cont x  3  lap(s) =  approx 450 ft  @ slow pace, stopped due to end of study with sob on 3rd lap with lowest 02 sats 90% and 87-88% on RA at rest    Due to polycythemia it is critical he maintain sats > 90% at all times/ reviewed

## 2021-03-11 NOTE — Assessment & Plan Note (Addendum)
Active smoker maint on Trelegy    Group D in terms of symptom/risk and laba/lama/ICS  therefore appropriate rx at this point >>>  Continue trelegy 100 q am and prn saba  Re SABA :  I spent extra time with pt today reviewing appropriate use of albuterol for prn use on exertion with the following points: 1) saba is for relief of sob that does not improve by walking a slower pace or resting but rather if the pt does not improve after trying this first. 2) If the pt is convinced, as many are, that saba helps recover from activity faster then it's easy to tell if this is the case by re-challenging : ie stop, take the inhaler, then p 5 minutes try the exact same activity (intensity of workload) that just caused the symptoms and see if they are substantially diminished or not after saba 3) if there is an activity that reproducibly causes the symptoms, try the saba 15 min before the activity on alternate days   If in fact the saba really does help, then fine to continue to use it prn but advised may need to look closer at the maintenance regimen being used to achieve better control of airways disease with exertion.    >>> f/u in 6 m with pfts on return

## 2021-03-11 NOTE — Assessment & Plan Note (Signed)
Counseled re importance of smoking cessation but did not meet time criteria for separate billing     Each maintenance medication was reviewed in detail including emphasizing most importantly the difference between maintenance and prns and under what circumstances the prns are to be triggered using an action plan format where appropriate.  Total time for H and P, chart review, counseling, reviewing hfa/neb/02 device(s) , directly observing portions of ambulatory 02 saturation study/ and generating customized AVS unique to this office visit / same day charting > 30 min

## 2021-03-11 NOTE — Patient Instructions (Signed)
Plan A = Automatic = Always=    Trelegy 141 one click each am   Plan B = Backup (to supplement plan A, not to replace it) Only use your albuterol inhaler as a rescue medication to be used if you can't catch your breath by resting or doing a relaxed purse lip breathing pattern.  - The less you use it, the better it will work when you need it. - Ok to use the inhaler up to 2 puffs  every 4 hours if you must but call for appointment if use goes up over your usual need - Don't leave home without it !!  (think of it like the spare tire for your car)   Plan C = Crisis (instead of Plan B but only if Plan B stops working) - only use your albuterol nebulizer (8 x stronger) if you first try Plan B and it fails to help > ok to use the nebulizer up to every 4 hours but if start needing it regularly call for immediate appointment    The key is to stop smoking completely before smoking completely stops you!  Goal with goal with 02 is keep it above 90% saturation at all times on your meter.  Please schedule a follow up visit in 6 months with pfts on return

## 2021-03-11 NOTE — Progress Notes (Signed)
Brian Parsons, adult    DOB: [birth year]      MRN: [id number]   Brief patient profile:  76 yowm active smoker followed previously by Dr Luan Pulling with copd c/b chronic hypoxemia / polycythemia and referred to pulmonary clinic 10/14/2019 by Dr   Bartolo Darter in [place]      History of Present Illness  10/14/2019  Pulmonary/ 1st office eval/Arial Galligan  - not vaccinated  Chief Complaint  Patient presents with   Pulmonary Consult    Referred by Dr [name]. Former pt of Dr Luan Pulling. He uses o2 with sleep and as needed during the day. He states has good and bad days with his breathing. Smokes 1/2 ppd. He is using albuterol inhaler 2 x per wk and neb about 2 x per day.   Dyspnea:  No trouble up and down aisles at [employer] [facility] [employer] parking  s 02 but never checks it Cough: minimal am cough x 30 min >  1-2 tsp slt green better p abx  Sleep: 30 degrees with flat bed / pillows  SABA use: too much neb rx  02  2.5 hs  Rec Plan A = Automatic = Always=     trelegy one click each  Plan B = Backup (to supplement plan A, not to replace it) Only use your albuterol inhaler as a rescue medication  Plan C = Crisis (instead of Plan B but only if Plan B stops working) - only use your albuterol nebulizer if you first try Plan B and it fails to help  Try albuterol 15 min before an activity that you know would make you short of breath and see if it makes any difference and if makes none then don't take it after activity unless you can't catch your breath. The key is to stop smoking completely before smoking completely stops you! I strongly urge you to get the moderna or pfizer vaccine asap based on proven safety and effectiveness  We will set you up for an overnight 02 level on your oxygen  Please schedule a follow up visit in 6 months but call sooner if needed     03/11/2021  f/u ov/[place] office/Marissia Blackham re: COPD ? Gold  maint on trelegy and 02 most of the time @ 2.5 lpm but req phlebotomy  with hct 61 on 02/28/21   Chief Complaint  Patient presents with   Follow-up    Feels symptoms have improved since last ov. SOB has improved. Trelegy has helped symptoms.   Uses oxygen at home 2.5L O2 cont.     Dyspnea:  improved since last ov/ trying to lose wt  Cough: smoker's rattle clear / first hour  Sleeping: flat bed but 30 degrees with pillows  SABA use: neb once a day  02: 2.5 25/7  Covid status: never vax/ never infected      No obvious day to day or daytime variability or assoc excess/ purulent sputum or mucus plugs or hemoptysis or cp or chest tightness, subjective wheeze or overt sinus or hb symptoms.   Sleeping without nocturnal  or early am exacerbation  of respiratory  c/o's or need for noct saba. Also denies any obvious fluctuation of symptoms with weather or environmental changes or other aggravating or alleviating factors except as outlined above   No unusual exposure hx or h/o childhood pna/ asthma or knowledge of premature birth.  Current Allergies, Complete Past Medical History, Past Surgical History, Family History, and Social History were reviewed in [employer]  electronic medical record.  ROS  The following are not active complaints unless bolded Hoarseness, sore throat, dysphagia, dental problems, itching, sneezing,  nasal congestion or discharge of excess mucus or purulent secretions, ear ache,   fever, chills, sweats, unintended wt loss or wt gain, classically pleuritic or exertional cp,  orthopnea pnd or arm/hand swelling  or leg swelling, presyncope, palpitations, abdominal pain, anorexia, nausea, vomiting, diarrhea  or change in bowel habits or change in bladder habits, change in stools or change in urine, dysuria, hematuria,  rash, arthralgias, visual complaints, headache, numbness, weakness or ataxia or problems with walking or coordination,  change in mood or  memory.        Current Meds  Medication Sig   albuterol (ACCUNEB) 0.63 MG/3ML nebulizer solution Take 3 mLs (0.63  mg total) by nebulization every 4 (four) hours as needed.   albuterol (VENTOLIN HFA) 108 (90 Base) MCG/ACT inhaler Inhale 2 puffs into the lungs every 6 (six) hours as needed for wheezing or shortness of breath.   allopurinol (ZYLOPRIM) 100 MG tablet Take 200 mg by mouth 2 (two) times daily.    aspirin EC 81 MG tablet Take 81 mg by mouth daily.   Blood Glucose Monitoring Suppl (TRUE METRIX METER) DEVI See admin instructions.   escitalopram (LEXAPRO) 20 MG tablet Take 20 mg by mouth daily.   Magnesium Gluconate 500 (27 Mg) MG TABS Take 500 mg by mouth in the morning and at bedtime.    metolazone (ZAROXOLYN) 2.5 MG tablet Take 2.5 mg by mouth every other day.    OXYGEN Inhale into the lungs. 2.5L at home use   potassium chloride (MICRO-K) 10 MEQ CR capsule TAKE 1 CAPSULE BY MOUTH ONCE DAILY WITH FOOD   sildenafil (REVATIO) 20 MG tablet 2-3  tablets 30 minutes-1 hour prior to sex   sitaGLIPtin (JANUVIA) 50 MG tablet Take 1 tablet (50 mg total) by mouth daily.   torsemide (DEMADEX) 20 MG tablet Take 80 mg by mouth 2 (two) times daily.    TRELEGY ELLIPTA 100-62.5-25 MCG/INH AEPB INHALE 1 PUFF ONCE DAILY.   vitamin B-12 (CYANOCOBALAMIN) 1000 MCG tablet Take 1,000 mcg by mouth daily.   Vitamin D, Ergocalciferol, (DRISDOL) 1.25 MG (50000 UNIT) CAPS capsule TAKE 1 CAPSULE BY MOUTH ONCE A WEEK             Past Medical History:  Diagnosis Date   Bilateral leg edema    CHF (congestive heart failure) (HCC)    COPD (chronic obstructive pulmonary disease) (HCC)    Coronary atherosclerosis    Nonobstructive at cardiac catheterization April 2017   Diabetes mellitus without complication ([place])    Essential hypertension    On home O2    2L N/C prn   Polycythemia         Objective:     Wt Readings from Last 3 Encounters:  03/11/21 215 lb 0.6 oz (97.5 kg)  11/15/20 217 lb 3.2 oz (98.5 kg)  05/17/20 224 lb 12.8 oz (102 kg)      Vital signs reviewed  03/11/2021  - Note at rest 02 sats  92% on  3lpm and RA = 88%   General appearance:    obese pleasant amb wm nad with mildly congested sounding cough      HEENT : pt wearing mask not removed for exam due to covid - 19 concerns.    NECK :  without JVD/Nodes/TM/ nl carotid upstrokes bilaterally   LUNGS: no acc muscle use,  Mild  barrel  contour chest wall with bilateral  Distant late exp rhonchi  and  without cough on insp or exp maneuvers  and mild  Hyperresonant  to  percussion bilaterally     CV:  RRR  no s3 or murmur or increase in P2, and no edema   ABD:  soft and nontender with pos end  insp Hoover's  in the supine position. No bruits or organomegaly appreciated, bowel sounds nl  MS:   Nl gait/  ext warm without deformities, calf tenderness, cyanosis or clubbing No obvious joint restrictions   SKIN: warm and dry without lesions    NEURO:  alert, approp, nl sensorium with  no motor or cerebellar deficits apparent.            Assessment

## 2021-04-21 NOTE — Progress Notes (Deleted)
VISIT CANCELLED BY PATIENT

## 2021-04-22 ENCOUNTER — Inpatient Hospital Stay (HOSPITAL_COMMUNITY): Payer: Medicare HMO

## 2021-04-22 ENCOUNTER — Encounter (HOSPITAL_COMMUNITY): Payer: Medicare HMO

## 2021-04-22 ENCOUNTER — Ambulatory Visit (HOSPITAL_COMMUNITY): Payer: Medicare HMO | Admitting: Physician Assistant

## 2021-05-18 ENCOUNTER — Other Ambulatory Visit: Payer: Self-pay

## 2021-05-18 ENCOUNTER — Ambulatory Visit (INDEPENDENT_AMBULATORY_CARE_PROVIDER_SITE_OTHER): Payer: Medicare HMO | Admitting: Nurse Practitioner

## 2021-05-18 ENCOUNTER — Encounter: Payer: Self-pay | Admitting: Nurse Practitioner

## 2021-05-18 VITALS — BP 110/60 | HR 82 | Ht 67.0 in | Wt 201.2 lb

## 2021-05-18 DIAGNOSIS — N1832 Chronic kidney disease, stage 3b: Secondary | ICD-10-CM

## 2021-05-18 DIAGNOSIS — I10 Essential (primary) hypertension: Secondary | ICD-10-CM

## 2021-05-18 DIAGNOSIS — E559 Vitamin D deficiency, unspecified: Secondary | ICD-10-CM

## 2021-05-18 DIAGNOSIS — E782 Mixed hyperlipidemia: Secondary | ICD-10-CM

## 2021-05-18 DIAGNOSIS — F17200 Nicotine dependence, unspecified, uncomplicated: Secondary | ICD-10-CM

## 2021-05-18 DIAGNOSIS — E1122 Type 2 diabetes mellitus with diabetic chronic kidney disease: Secondary | ICD-10-CM

## 2021-05-18 LAB — POCT GLYCOSYLATED HEMOGLOBIN (HGB A1C): HbA1c, POC (controlled diabetic range): 5.4 % (ref 0.0–7.0)

## 2021-05-18 MED ORDER — SITAGLIPTIN PHOSPHATE 25 MG PO TABS: 25.0000 mg | ORAL_TABLET | Freq: Every day | ORAL | 1 refills | Status: DC

## 2021-05-18 NOTE — Progress Notes (Signed)
05/18/2021, 11:24 AM  Endocrinology follow-up note   Subjective:    Patient ID: Brian Parsons, adult    DOB: October 16, 1950.  Brian Parsons is being seen in follow-up after he was seen in consultation for management of currently uncontrolled symptomatic diabetes requested by  Brian Schwalbe, MD.   Past Medical History:  Diagnosis Date   Bilateral leg edema    CHF (congestive heart failure) ([place])    COPD (chronic obstructive pulmonary disease) ([place])    Coronary atherosclerosis    Nonobstructive at cardiac catheterization April 2017   Diabetes mellitus without complication ([place])    Essential hypertension    On home O2    2L N/C prn   Polycythemia     Past Surgical History:  Procedure Laterality Date   CARDIAC CATHETERIZATION N/A 08/10/2015   Procedure: Left Heart Cath and Coronary Angiography;  Surgeon: Brian Sine, MD;  Location: [facility];  Service: Cardiovascular;  Laterality: N/A;    Social History   Socioeconomic History   Marital status: Divorced    Spouse name: Not on file   Number of children: Not on file   Years of education: Not on file   Highest education level: Not on file  Occupational History   Not on file  Tobacco Use   Smoking status: Every Day    Packs/day: 1.00    Years: 50.00    Pack years: 50.00    Types: Cigarettes    Start date: 04/17/1961   Smokeless tobacco: Never  Vaping Use   Vaping Use: Never used  Substance and Sexual Activity   Alcohol use: No    Alcohol/week: 0.0 standard drinks    Comment: No EtOH for 7 years   Drug use: No   Sexual activity: Yes  Other Topics Concern   Not on file  Social History Narrative   Not on file   Social Determinants of Health   Financial Resource Strain: Not on file  Food Insecurity: Not on file  Transportation Needs: Not on file  Physical Activity: Not on file  Stress: Not on file  Social Connections:  Not on file    Family History  Problem Relation Age of Onset   Stroke Mother    Emphysema Father     Outpatient Encounter Medications as of 05/18/2021  Medication Sig   Accu-Chek Softclix Lancets lancets    albuterol (ACCUNEB) 0.63 MG/3ML nebulizer solution Take 3 mLs (0.63 mg total) by nebulization every 4 (four) hours as needed.   albuterol (VENTOLIN HFA) 108 (90 Base) MCG/ACT inhaler Inhale 2 puffs into the lungs every 6 (six) hours as needed for wheezing or shortness of breath.   allopurinol (ZYLOPRIM) 100 MG tablet Take 200 mg by mouth 2 (two) times daily.    aspirin EC 81 MG tablet Take 81 mg by mouth daily.   azelastine (ASTELIN) 0.1 % nasal spray 1 puff in each nostril   Blood Glucose Monitoring Suppl (TRUE METRIX METER) DEVI See admin instructions.   doxycycline (VIBRAMYCIN) 100 MG capsule Take by mouth.   escitalopram (LEXAPRO) 20 MG tablet Take  20 mg by mouth daily.   fluticasone (FLONASE) 50 MCG/ACT nasal spray 1 spray in each nostril   Fluticasone-Umeclidin-Vilant (TRELEGY ELLIPTA) 100-62.5-25 MCG/ACT AEPB One click each am   ipratropium-albuterol (DUONEB) 0.5-2.5 (3) MG/3ML SOLN Take by nebulization.   Magnesium Gluconate 500 (27 Mg) MG TABS Take 500 mg by mouth in the morning and at bedtime.    metolazone (ZAROXOLYN) 2.5 MG tablet Take 2.5 mg by mouth every other day.    MITIGARE 0.6 MG CAPS Take by mouth.   OXYGEN Inhale into the lungs. 2.5L at home use   potassium chloride (MICRO-K) 10 MEQ CR capsule TAKE 1 CAPSULE BY MOUTH ONCE DAILY WITH FOOD   sildenafil (VIAGRA) 50 MG tablet Take by mouth.   torsemide (DEMADEX) 20 MG tablet Take 80 mg by mouth 2 (two) times daily.    vitamin B-12 (CYANOCOBALAMIN) 1000 MCG tablet Take 1,000 mcg by mouth daily.   [DISCONTINUED] sitaGLIPtin (JANUVIA) 50 MG tablet Take 1 tablet (50 mg total) by mouth daily.   atorvastatin (LIPITOR) 20 MG tablet Take 20 mg by mouth daily at 6 PM.    carvedilol (COREG) 3.125 MG tablet Take 1 tablet  (3.125 mg total) by mouth 2 (two) times daily with a meal.   sitaGLIPtin (JANUVIA) 25 MG tablet Take 1 tablet (25 mg total) by mouth daily.   [DISCONTINUED] allopurinol (ZYLOPRIM) 100 MG tablet Take by mouth. (Patient not taking: Reported on 05/18/2021)   [DISCONTINUED] guaiFENesin (ROBITUSSIN) 100 MG/5ML SOLN  (Patient not taking: Reported on 05/18/2021)   [DISCONTINUED] sildenafil (REVATIO) 20 MG tablet 2-3  tablets 30 minutes-1 hour prior to sex (Patient not taking: Reported on 05/18/2021)   [DISCONTINUED] Vitamin D, Ergocalciferol, (DRISDOL) 1.25 MG (50000 UNIT) CAPS capsule TAKE 1 CAPSULE BY MOUTH ONCE A WEEK (Patient not taking: Reported on 05/18/2021)   No facility-administered encounter medications on file as of 05/18/2021.    ALLERGIES: Allergies  Allergen Reactions   Amoxicillin Hives    Other reaction(s): rash   Other     Other reaction(s): Unknown   Penicillin G     Other reaction(s): rash   Penicillins Hives    Has patient had a PCN reaction causing immediate rash, facial/tongue/throat swelling, SOB or lightheadedness with hypotension: Unknown Has patient had a PCN reaction causing severe rash involving mucus membranes or skin necrosis: No Has patient had a PCN reaction that required hospitalization No Has patient had a PCN reaction occurring within the last 10 years: No If all of the above answers are "NO", then may proceed with Cephalosporin use.     VACCINATION STATUS:  There is no immunization history on file for this patient.  Diabetes She presents for her follow-up diabetic visit. She has type 2 diabetes mellitus. Onset time: He was diagnosed few months ago at approximate age of 52 years. Her disease course has been improving. There are no hypoglycemic associated symptoms. Pertinent negatives for hypoglycemia include no confusion, headaches, pallor or seizures. Pertinent negatives for diabetes include no chest pain, no fatigue, no polydipsia, no polyphagia and no polyuria.  There are no hypoglycemic complications. Symptoms are stable. Diabetic complications include heart disease and nephropathy. Risk factors for coronary artery disease include dyslipidemia, diabetes mellitus, family history, male sex, obesity, tobacco exposure, sedentary lifestyle and hypertension. Current diabetic treatment includes oral agent (monotherapy). She is compliant with treatment all of the time. Her weight is decreasing steadily. She is following a generally healthy diet. Meal planning includes avoidance of concentrated sweets. She has not  had a previous visit with a dietitian. She never participates in exercise. Her home blood glucose trend is decreasing steadily. Her breakfast blood glucose range is generally 110-130 mg/dl. (He presents today with his meter and logs showing stable, at goal, glycemic pattern overall.  His POCT A1c today is 5.4%, improving still from last visit of 5.7%.  He denies any s/s of hypoglycemia. ) She does not see a podiatrist.Eye exam is not current.  Hyperlipidemia This is a chronic problem. The current episode started more than 1 year ago. The problem is controlled. Recent lipid tests were reviewed and are normal. Exacerbating diseases include chronic renal disease, diabetes and obesity. Factors aggravating her hyperlipidemia include fatty foods, smoking and beta blockers. Pertinent negatives include no chest pain, myalgias or shortness of breath. Current antihyperlipidemic treatment includes statins. The current treatment provides moderate improvement of lipids. There are no compliance problems.  Risk factors for coronary artery disease include diabetes mellitus, dyslipidemia, hypertension, male sex and a sedentary lifestyle.  Hypertension This is a chronic problem. The current episode started more than 1 year ago. The problem has been gradually improving since onset. The problem is controlled. Pertinent negatives include no chest pain, headaches, palpitations or  shortness of breath. There are no associated agents to hypertension. Risk factors for coronary artery disease include diabetes mellitus, dyslipidemia, male gender, obesity, smoking/tobacco exposure and sedentary lifestyle. Past treatments include diuretics and beta blockers. The current treatment provides moderate improvement. There are no compliance problems.  Hypertensive end-organ damage includes kidney disease and heart failure. Identifiable causes of hypertension include chronic renal disease.    Review of systems  Constitutional: + Minimally fluctuating body weight,  current Body mass index is 31.51 kg/m. , no fatigue, no subjective hyperthermia, no subjective hypothermia Eyes: no blurry vision, no xerophthalmia ENT: no sore throat, no nodules palpated in throat, no dysphagia/odynophagia, no hoarseness Cardiovascular: no chest pain, no shortness of breath, no palpitations, no leg swelling Respiratory: no cough, no shortness of breath Gastrointestinal: no nausea/vomiting/diarrhea Musculoskeletal: no muscle/joint aches Skin: no rashes, no hyperemia Neurological: no tremors, no numbness, no tingling, no dizziness Psychiatric: no depression, no anxiety   Objective:    BP 110/60    Pulse 82    Ht 5\' 7"  (1.702 m)    Wt 201 lb 3.2 oz (91.3 kg)    BMI 31.51 kg/m   Wt Readings from Last 3 Encounters:  05/18/21 201 lb 3.2 oz (91.3 kg)  03/11/21 215 lb 0.6 oz (97.5 kg)  11/15/20 217 lb 3.2 oz (98.5 kg)    BP Readings from Last 3 Encounters:  05/18/21 110/60  03/11/21 136/80  11/15/20 123/70     Physical Exam- Limited  Constitutional:  Body mass index is 31.51 kg/m. , not in acute distress, normal state of mind Eyes:  EOMI, no exophthalmos Neck: Supple Cardiovascular: RRR, no murmurs, rubs, or gallops, no edema Respiratory: Adequate breathing efforts, no crackles, rales, rhonchi, or wheezing Musculoskeletal: no gross deformities, strength intact in all four extremities, no gross  restriction of joint movements Skin:  no rashes, no hyperemia Neurological: no tremor with outstretched hands     CMP ( most recent) CMP     Component Value Date/Time   NA 137 02/09/2021 1002   K 3.6 02/09/2021 1002   CL 78 (L) 02/09/2021 1002   CO2 40 (H) 02/09/2021 1002   GLUCOSE 151 (H) 02/09/2021 1002   GLUCOSE 87 05/10/2020 1020   BUN 63 (H) 02/09/2021 1002   CREATININE  2.00 (H) 02/09/2021 1002   CREATININE 1.54 (H) 05/10/2020 1020   CALCIUM 10.2 02/09/2021 1002   PROT 7.3 02/09/2021 1002   ALBUMIN 4.6 02/09/2021 1002   AST 25 02/09/2021 1002   ALT 13 02/09/2021 1002   ALKPHOS 117 02/09/2021 1002   BILITOT 0.6 02/09/2021 1002   GFRNONAA 45 (L) 05/10/2020 1020   GFRAA 52 (L) 05/10/2020 1020     Diabetic Labs (most recent): Lab Results  Component Value Date   HGBA1C 5.4 05/18/2021   HGBA1C 5.7 05/17/2020   HGBA1C 5.9 (A) 11/10/2019     Lipid Panel ( most recent) Lipid Panel     Component Value Date/Time   CHOL 106 11/11/2020 0946   TRIG 118 11/11/2020 0946   HDL 33 (L) 11/11/2020 0946   CHOLHDL 3.2 11/11/2020 0946   CHOLHDL 3.3 07/10/2019 1051   VLDL 29 07/30/2015 0441   LDLCALC 51 11/11/2020 0946   LDLCALC 42 07/10/2019 1051   LABVLDL 22 11/11/2020 0946      Lab Results  Component Value Date   TSH 1.560 11/11/2020   TSH 1.45 07/10/2019   TSH 1.098 08/03/2014   FREET4 1.20 11/11/2020   FREET4 1.1 07/10/2019      Assessment & Plan:   1) Type 2 diabetes mellitus with stage 3a chronic kidney disease, without long-term current use of insulin (HCC)  - Philander Ake has currently uncontrolled symptomatic type 2 DM since  71 years of age.  He presents today with his meter and logs showing stable, at goal, glycemic pattern overall.  His POCT A1c today is 5.4%, improving still from last visit of 5.7%.  He denies any s/s of hypoglycemia.  - I had a long discussion with her about the progressive nature of diabetes and the pathology behind its  complications. -her diabetes is complicated by CKD, obesity/sedentary life, chronic heavy smoking and she remains at a high risk for more acute and chronic complications which include CAD, CVA, CKD, retinopathy, and neuropathy. These are all discussed in detail with her.  - Nutritional counseling repeated at each appointment due to patients tendency to fall back in to old habits.  - The patient admits there is a room for improvement in their diet and drink choices. -  Suggestion is made for the patient to avoid simple carbohydrates from their diet including Cakes, Sweet Desserts / Pastries, Ice Cream, Soda (diet and regular), Sweet Tea, Candies, Chips, Cookies, Sweet Pastries, Store Bought Juices, Alcohol in Excess of 1-2 drinks a day, Artificial Sweeteners, Coffee Creamer, and "Sugar-free" Products. This will help patient to have stable blood glucose profile and potentially avoid unintended weight gain.   - I encouraged the patient to switch to unprocessed or minimally processed complex starch and increased protein intake (animal or plant source), fruits, and vegetables.   - Patient is advised to stick to a routine mealtimes to eat 3 meals a day and avoid unnecessary snacks (to snack only to correct hypoglycemia).  - I have approached her with the following individualized plan to manage  her diabetes and patient agrees:   -Based on his improving glycemic profile, will lower his Januvia to 25 mg po daily.    -He is encouraged to continue monitoring blood glucose once daily before breakfast and to call the clinic if he has readings less than 70 or above 200 for 3 tests in a row.   - he is not a candidate for metformin, SGLT2 inhibitors due to concurrent renal insufficiency.  -He  will not be considered for GLP-1 receptor agonists due to his heavy smoking history.  - Specific targets for  A1c;  LDL, HDL, Triglycerides, and  Waist Circumference were discussed with the patient.  2) Blood Pressure  /Hypertension:  His blood pressure is controlled to target.   she is advised to continue her current medications including Carvedilol 3.125 mg p.o. nightly, Metolazone 2.5 mg QOD, Torsemide 80 mg po BID.    3) Lipids/Hyperlipidemia:    Review of her recent lipid panel from 11/11/20 showed controlled LDL at 51 .  she is advised to continue Atorvastatin 20 mg p.o. daily at bedtime.  Side effects and precautions discussed with her.  4)  Weight/Diet:  His Body mass index is 31.51 kg/m.  -   clearly complicating her diabetes care.   she is  a candidate for weight loss. I discussed with her the fact that loss of 5 - 10% of her  current body weight will have the most impact on her diabetes management.  Exercise, and detailed carbohydrates information provided  -  detailed on discharge instructions.  5) Vitamin D deficiency:  His most recent vitamin D level was 44.3 on 12/29/19.  He is not currently on any supplementation at this time.  6) Chronic Care/Health Maintenance: -she is on Statin medications and is encouraged to initiate and continue to follow up with Ophthalmology, Dentist, Podiatrist at least yearly or according to recommendations, and advised to  quit smoking. I have recommended yearly flu vaccine and pneumonia vaccine at least every 5 years; moderate intensity exercise for up to 150 minutes weekly; and  sleep for at least 7 hours a day.  -He is counseled extensively for smoking cessation.  - she is advised to maintain close follow up with Brian Schwalbe, MD for primary care needs, as well as her other providers for optimal and coordinated care.     I spent 45 minutes in the care of the patient today including review of labs from [facility], Lipids, Thyroid Function, Hematology (current and previous including abstractions from other facilities); face-to-face time discussing  her blood glucose readings/logs, discussing hypoglycemia and hyperglycemia episodes and symptoms, medications doses, her  options of short and long term treatment based on the latest standards of care / guidelines;  discussion about incorporating lifestyle medicine;  and documenting the encounter.    Please refer to Patient Instructions for Blood Glucose Monitoring and Insulin/Medications Dosing Guide"  in media tab for additional information. Please  also refer to " Patient Self Inventory" in the Media  tab for reviewed elements of pertinent patient history.  Lunette Stands participated in the discussions, expressed understanding, and voiced agreement with the above plans.  All questions were answered to her satisfaction. she is encouraged to contact clinic should she have any questions or concerns prior to her return visit.   Follow up plan: - Return in about 6 months (around 11/15/2021) for Diabetes F/U- A1c and UM in office, Previsit labs, Bring meter and logs.  Rayetta Pigg, [facility] [address] [place], [place] [address] Phone: [phone] Fax: [phone]  05/18/2021, 11:24 AM

## 2021-05-18 NOTE — Patient Instructions (Signed)

## 2021-08-29 ENCOUNTER — Encounter: Payer: Self-pay | Admitting: Internal Medicine

## 2021-08-29 ENCOUNTER — Ambulatory Visit (INDEPENDENT_AMBULATORY_CARE_PROVIDER_SITE_OTHER): Payer: Medicare HMO | Admitting: Internal Medicine

## 2021-08-29 DIAGNOSIS — J9612 Chronic respiratory failure with hypercapnia: Secondary | ICD-10-CM | POA: Diagnosis not present

## 2021-08-29 DIAGNOSIS — F17210 Nicotine dependence, cigarettes, uncomplicated: Secondary | ICD-10-CM | POA: Diagnosis not present

## 2021-08-29 DIAGNOSIS — J9611 Chronic respiratory failure with hypoxia: Secondary | ICD-10-CM

## 2021-08-29 DIAGNOSIS — J449 Chronic obstructive pulmonary disease, unspecified: Secondary | ICD-10-CM

## 2021-08-29 MED ORDER — AZITHROMYCIN 250 MG PO TABS: ORAL_TABLET | ORAL | 0 refills | Status: DC

## 2021-08-29 NOTE — Progress Notes (Signed)
? ?Brian Parsons, adult    DOB: 1950-12-17      MRN: [id number] ? ? ?Brief patient profile:  ?64 yowm active smoker followed previously by Dr Luan Pulling with copd c/b chronic hypoxemia / polycythemia and referred to pulmonary clinic 10/14/2019 by Dr   Bartolo Darter in [place]  ? ? ? ? ?History of Present Illness  ?10/14/2019  Pulmonary/ 1st office eval/Brian Parsons  - not vaccinated  ?Chief Complaint  ?Patient presents with  ? Pulmonary Consult  ?  Referred by Dr [name]. Former pt of Dr Luan Pulling. He uses o2 with sleep and as needed during the day. He states has good and bad days with his breathing. Smokes 1/2 ppd. He is using albuterol inhaler 2 x per wk and neb about 2 x per day.   ?Dyspnea:  No trouble up and down aisles at [employer] [facility] parking  s 02 but never checks it ?Cough: minimal am cough x 30 min >  1-2 tsp slt green better p abx  ?Sleep: 30 degrees with flat bed / pillows  ?SABA use: too much neb rx  ?02  2.5 hs  ?Rec ?Plan A = Automatic = Always=     trelegy one click each  ?Plan B = Backup (to supplement plan A, not to replace it) ?Only use your albuterol inhaler as a rescue medication  ?Plan C = Crisis (instead of Plan B but only if Plan B stops working) ?- only use your albuterol nebulizer if you first try Plan B and it fails to help  ?Try albuterol 15 min before an activity that you know would make you short of breath and see if it makes any difference and if makes none then don't take it after activity unless you can't catch your breath. ?The key is to stop smoking completely before smoking completely stops you! ?I strongly urge you to get the moderna or pfizer vaccine asap based on proven safety and effectiveness  ?We will set you up for an overnight 02 level on your oxygen ? Please schedule a follow up visit in 6 months but call sooner if needed  ? ? ? ?03/11/2021  f/u ov/Brian Parsons re: COPD ? Gold  maint on trelegy and 02 most of the time @ 2.5 lpm but req phlebotomy  with hct 61 on 02/28/21   ?Chief Complaint  ?Patient presents with  ? Follow-up  ?  Feels symptoms have improved since last ov. SOB has improved. Trelegy has helped symptoms.  ? ?Uses oxygen at home 2.5L O2 cont.   ?  ?Dyspnea:  improved since last ov/ trying to lose wt  ?Cough: smoker's rattle clear / first hour  ?Sleeping: flat bed but 30 degrees with pillows  ?SABA use: neb once a day  ?02: 2.5 25/7  ?Covid status: never vax/ never infected  ?Rec ?Plan A = Automatic = Always=    Trelegy 329 one click each am  ?Plan B = Backup (to supplement plan A, not to replace it) ?Only use your albuterol inhaler as a rescue medication  ?Plan C = Crisis (instead of Plan B but only if Plan B stops working) ?- only use your albuterol nebulizer (8 x stronger) if you first try Plan B   ?The key is to stop smoking completely before smoking completely stops you! ?Goal with goal with 02 is keep it above 90% saturation at all times on your meter. ? ? ? ? 08/29/2021  f/u ov/[place] office/Brian Parsons re: GOLD ? Copd  maint on trelegy and requiring phlebotomy for secondary polycythemia  ?Chief Complaint  ?Patient presents with  ? Follow-up  ?  Sob is a little better since last OV.  ?Still using 2.5 LO2 cont.  At home prn   ? Dyspnea:  MMRC2 = can't walk a nl pace on a flat grade s sob but does fine slow and flat / has trouble with lifting  ?Cough: some am congestion/rattling green mucus x sev easy  ?Sleeping: flat bed/ 3-4 pillows  ?SABA use: not much hfa/ seldom hs ?02: 2lpm hs and prn  ?Covid status: never vax / infected  ?Lung cancer screening: referred back today  ? ? ?No obvious day to day or daytime variability or  mucus plugs or hemoptysis or cp or chest tightness, subjective wheeze or overt sinus or hb symptoms.  ? ?Sleeping as above  without nocturnal  or early am exacerbation  of respiratory  c/o's or need for noct saba. Also denies any obvious fluctuation of symptoms with weather or environmental changes or other aggravating or alleviating factors except  as outlined above  ? ?No unusual exposure hx or h/o childhood pna/ asthma or knowledge of premature birth. ? ?Current Allergies, Complete Past Medical History, Past Surgical History, Family History, and Social History were reviewed in [employer] [employer] record. ? ?ROS  The following are not active complaints unless bolded ?Hoarseness, sore throat, dysphagia, dental problems, itching, sneezing,  nasal congestion or discharge of excess mucus or purulent secretions, ear ache,   fever, chills, sweats, unintended wt loss or wt gain, classically pleuritic or exertional cp,  orthopnea pnd or arm/hand swelling  or leg swelling, presyncope, palpitations, abdominal pain, anorexia, nausea, vomiting, diarrhea  or change in bowel habits or change in bladder habits, change in stools or change in urine, dysuria, hematuria,  rash, arthralgias, visual complaints, headache, numbness, weakness or ataxia or problems with walking or coordination,  change in mood or  memory. ?      ? ?Current Meds  ?Medication Sig  ? Accu-Chek Softclix Lancets lancets   ? albuterol (ACCUNEB) 0.63 MG/3ML nebulizer solution Take 3 mLs (0.63 mg total) by nebulization every 4 (four) hours as needed.  ? albuterol (VENTOLIN HFA) 108 (90 Base) MCG/ACT inhaler Inhale 2 puffs into the lungs every 6 (six) hours as needed for wheezing or shortness of breath.  ? allopurinol (ZYLOPRIM) 100 MG tablet Take 200 mg by mouth 2 (two) times daily.   ? aspirin EC 81 MG tablet Take 81 mg by mouth daily.  ? azelastine (ASTELIN) 0.1 % nasal spray 1 puff in each nostril  ? Blood Glucose Monitoring Suppl (TRUE METRIX METER) DEVI See admin instructions.  ? escitalopram (LEXAPRO) 20 MG tablet Take 20 mg by mouth daily.  ? fluticasone (FLONASE) 50 MCG/ACT nasal spray 1 spray in each nostril  ? Fluticasone-Umeclidin-Vilant (TRELEGY ELLIPTA) 100-62.5-25 MCG/ACT AEPB One click each am  ? ipratropium-albuterol (DUONEB) 0.5-2.5 (3) MG/3ML SOLN Take by nebulization.  ?  Magnesium Gluconate 500 (27 Mg) MG TABS Take 500 mg by mouth in the morning and at bedtime.   ? metolazone (ZAROXOLYN) 2.5 MG tablet Take 2.5 mg by mouth every other day.   ? MITIGARE 0.6 MG CAPS Take by mouth.  ? OXYGEN Inhale into the lungs. 2.5L at home use  ? potassium chloride (MICRO-K) 10 MEQ CR capsule TAKE 1 CAPSULE BY MOUTH ONCE DAILY WITH FOOD  ? sildenafil (VIAGRA) 50 MG tablet Take by mouth.  ? sitaGLIPtin (JANUVIA)  25 MG tablet Take 1 tablet (25 mg total) by mouth daily.  ? torsemide (DEMADEX) 20 MG tablet Take 80 mg by mouth 2 (two) times daily.   ? vitamin B-12 (CYANOCOBALAMIN) 1000 MCG tablet Take 1,000 mcg by mouth daily.  ?     ?  ?   ? ?Past Medical History:  ?Diagnosis Date  ? Bilateral leg edema   ? CHF (congestive heart failure) ([place])   ? COPD (chronic obstructive pulmonary disease) ([place])   ? Coronary atherosclerosis   ? Nonobstructive at cardiac catheterization April 2017  ? Diabetes mellitus without complication ([place])   ? Essential hypertension   ? On home O2   ? 2L N/C prn  ? Polycythemia   ? ?  ? ? ? ?Objective:  ?  ? ?08/29/2021       199  ?03/11/21 215 lb 0.6 oz (97.5 kg)  ?11/15/20 217 lb 3.2 oz (98.5 kg)  ?05/17/20 224 lb 12.8 oz (102 kg)  ?  ?Vital signs reviewed  08/29/2021  - Note at rest 02 sats  90% on RA  ? ?General appearance:   elderly wm / rattling cough   ? ?HEENT :  Oropharynx  edentulous  Nasal turbintes mod edema/ slt mp secretions  ? ? ?NECK :  without JVD/Nodes/TM/ nl carotid upstrokes bilaterally ? ? ?LUNGS: no acc muscle use,  Mod barrel  contour chest wall with bilateral  Distant bs s audible wheeze and  without cough on insp or exp maneuvers and mod  Hyperresonant  to  percussion bilaterally   ? ? ?CV:  RRR  no s3 or murmur or increase in P2, and no edema  ? ?ABD:  soft and nontender with pos mid insp Hoover's  in the supine position. No bruits or organomegaly appreciated, bowel sounds nl ? ?MS:   Ext warm without deformities or   obvious joint restrictions , calf  tenderness, cyanosis or clubbing ? ?SKIN: warm and dry with dependent rubror hands and feet  ? ?NEURO:  alert, approp, nl sensorium with  no motor or cerebellar deficits apparent.  ?   ?    ? ?   ?Assessment  ?

## 2021-08-29 NOTE — Patient Instructions (Addendum)
My office will be contacting you by phone for referral to consider starting back on lung cancer screening  ? ?For "allergies"  = zyrtec 10 mg in pm as needed  ? ?Zpak  ? ?Make sure you check your oxygen saturation  AT  your highest level of activity (not after you stop)   to be sure it stays over 90% and adjust  02 flow upward to maintain this level if needed but remember to turn it back to previous settings when you stop (to conserve your supply).  ? ? ?Please schedule a follow up visit in 6 months but call sooner if needed    ?

## 2021-08-30 ENCOUNTER — Encounter: Payer: Self-pay | Admitting: Internal Medicine

## 2021-08-30 NOTE — Assessment & Plan Note (Signed)
HC03  07/24/19     43  ?[facility]03 02/09/21  33  ?- 03/11/2021   Walked on 3lpm cont x  3  lap(s) =  approx 450 ft  @ slow pace, stopped due to end of study with sob on 3rd lap with lowest 02 sats 90% and 87-88% on RA at rest  ?- 08/29/2021   Walked on RA  x  1  lap(s) =  approx 150 ft   at a slow pace   Stopped after first lap due to SOB with sats down to 86% Refused O2 ? ?Advised re importance of keeping sats up to prevent life threatening polycthemia ? ?Specifically: ?Make sure you check your oxygen saturation  AT  your highest level of activity (not after you stop)   to be sure it stays over 90% and adjust  02 flow upward to maintain this level if needed but remember to turn it back to previous settings when you stop (to conserve your supply).  ?

## 2021-08-30 NOTE — Assessment & Plan Note (Signed)
Counseled re importance of smoking cessation but did not meet time criteria for separate billing   ? ?Low-dose CT lung cancer screening is recommended for patients who ?are 3-73 years of age with a 20+ pack-year history of smoking and ?who are currently smoking or quit <=15 years ago. ?No coughing up blood  ?No unintentional weight loss of > 15 pounds in the last 6 months ?- pt is eligible for scanning yearly until age 67 > referred back to program  ? ? ?    ?  ? ?Each maintenance medication was reviewed in detail including emphasizing most importantly the difference between maintenance and prns and under what circumstances the prns are to be triggered using an action plan format where appropriate. ? ?Total time for H and P, chart review, counseling, reviewing dpi/hfa/02 device(s) and generating customized AVS unique to this office visit / same day charting > 30 min  ?      ? ? ?

## 2021-08-30 NOTE — Assessment & Plan Note (Addendum)
Active smoker maint on Trelegy  ?- 08/29/2021 refused pfts (even simple  spirometry)  ? ? Group D (now reclassified as E) in terms of symptom/risk and laba/lama/ICS  therefore appropriate rx at this point >>>  Continue trelegy and approp saba  ? ? ?For purulent tracheobronchitis rec zpak  ? ?For rhinitis ? Allergic rec zyrtec 10 mg q pm prn  ?  ?

## 2021-09-16 ENCOUNTER — Other Ambulatory Visit (HOSPITAL_COMMUNITY): Payer: Self-pay | Admitting: Physician Assistant

## 2021-09-16 DIAGNOSIS — D751 Secondary polycythemia: Secondary | ICD-10-CM

## 2021-09-16 NOTE — Progress Notes (Deleted)
SAME-DAY CANCELLATION

## 2021-09-19 ENCOUNTER — Inpatient Hospital Stay (HOSPITAL_COMMUNITY): Payer: Medicare HMO | Admitting: Physician Assistant

## 2021-09-19 ENCOUNTER — Inpatient Hospital Stay (HOSPITAL_COMMUNITY): Payer: Medicare HMO

## 2021-09-30 NOTE — Progress Notes (Signed)
[facility] 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  Brian Parsons was last seen by NP Mathis Bud on 09/23/2019.  His last phlebotomy was on 11/20/2019, after which time Brian Parsons was lost to follow-up.  At today's visit, Brian Parsons reports feeling fair.  No recent hospitalizations, surgeries, or changes in baseline health status.  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  Brian Parsons is taking aspirin 81 mg daily.  Brian Parsons denies any history of DVT, PE, MI, or CVA, but does note that Brian Parsons had a left heart catheterization and "had his arteries cleaned out" in 2017.  Brian Parsons admits to dusky red discoloration of his fingers, but denies any Raynaud's phenomenon.  No aquagenic pruritus or erythromelalgia.  Brian Parsons does have intermittent dizziness and blurry vision.  Brian Parsons denies any tinnitus, strokelike symptoms, or neuropathy.  No B symptoms such as fever, chills, night sweats, unintentional weight loss.  Brian Parsons denies any abdominal pain, nausea, or early satiety.  Brian Parsons has 70% energy and 100% appetite.  Brian Parsons endorses that Brian Parsons is maintaining a stable weight.   REVIEW OF SYSTEMS:    Review of Systems  Constitutional:  Positive for fatigue. Negative for appetite change, chills, diaphoresis, fever and unexpected weight change.  HENT:   Negative for lump/mass and nosebleeds.   Eyes:  Negative for eye problems.  Respiratory:  Positive for cough and  shortness of breath. Negative for hemoptysis.   Cardiovascular:  Negative for chest pain, leg swelling and palpitations.  Gastrointestinal:  Negative for abdominal pain, blood in stool, constipation, diarrhea, nausea and vomiting.  Genitourinary:  Negative for hematuria.   Skin: Negative.   Neurological:  Positive for dizziness. Negative for headaches and light-headedness.  Hematological:  Does not bruise/bleed easily.  Psychiatric/Behavioral:  Positive for sleep disturbance.      PAST MEDICAL/SURGICAL HISTORY:  Past Medical History:  Diagnosis Date   Bilateral leg edema    CHF (congestive heart failure) (HCC)    COPD (chronic obstructive pulmonary disease) (HCC)    Coronary atherosclerosis    Nonobstructive at cardiac catheterization April 2017   Diabetes mellitus without complication (HCC)    Essential hypertension    On home O2    2L N/C prn   Polycythemia    Past Surgical History:  Procedure Laterality Date   CARDIAC CATHETERIZATION N/A 08/10/2015   Procedure: Left Heart Cath and Coronary Angiography;  Surgeon: Lennette Bihari, MD;  Location: [facility] INVASIVE CV [facility];  Service: Cardiovascular;  Laterality: N/A;     SOCIAL HISTORY:  Social History   Socioeconomic History   Marital status: Divorced    Spouse name: Not on file   Number of children: Not on file   Years of education: Not on file   Highest education level: Not on file  Occupational History   Not on file  Tobacco Use   Smoking status: Every Day    Packs/day:  1.00    Years: 50.00    Total pack years: 50.00    Types: Cigarettes    Start date: 04/17/1961   Smokeless tobacco: Never  Vaping Use   Vaping Use: Never used  Substance and Sexual Activity   Alcohol use: No    Alcohol/week: 0.0 standard drinks of alcohol    Comment: No EtOH for 7 years   Drug use: No   Sexual activity: Yes  Other Topics Concern   Not on file  Social History Narrative   Not on file   Social Determinants of Health   Financial  Resource Strain: Not on file  Food Insecurity: Not on file  Transportation Needs: Not on file  Physical Activity: Not on file  Stress: Not on file  Social Connections: Not on file  Intimate Partner Violence: Not on file    FAMILY HISTORY:  Family History  Problem Relation Age of Onset   Stroke Mother    Emphysema Father     CURRENT MEDICATIONS:  Outpatient Encounter Medications as of 10/03/2021  Medication Sig   Accu-Chek Softclix Lancets lancets    albuterol (ACCUNEB) 0.63 MG/3ML nebulizer solution Take 3 mLs (0.63 mg total) by nebulization every 4 (four) hours as needed.   albuterol (VENTOLIN HFA) 108 (90 Base) MCG/ACT inhaler Inhale 2 puffs into the lungs every 6 (six) hours as needed for wheezing or shortness of breath.   allopurinol (ZYLOPRIM) 100 MG tablet Take 200 mg by mouth 2 (two) times daily.    aspirin EC 81 MG tablet Take 81 mg by mouth daily.   atorvastatin (LIPITOR) 20 MG tablet Take 20 mg by mouth daily at 6 PM.    azelastine (ASTELIN) 0.1 % nasal spray 1 puff in each nostril   azithromycin (ZITHROMAX) 250 MG tablet Take 2 on day one then 1 daily x 4 days   Blood Glucose Monitoring Suppl (TRUE METRIX METER) DEVI See admin instructions.   carvedilol (COREG) 3.125 MG tablet Take 1 tablet (3.125 mg total) by mouth 2 (two) times daily with a meal.   escitalopram (LEXAPRO) 20 MG tablet Take 20 mg by mouth daily.   fluticasone (FLONASE) 50 MCG/ACT nasal spray 1 spray in each nostril   Fluticasone-Umeclidin-Vilant (TRELEGY ELLIPTA) 100-62.5-25 MCG/ACT AEPB One click each am   ipratropium-albuterol (DUONEB) 0.5-2.5 (3) MG/3ML SOLN Take by nebulization.   Magnesium Gluconate 500 (27 Mg) MG TABS Take 500 mg by mouth in the morning and at bedtime.    metolazone (ZAROXOLYN) 2.5 MG tablet Take 2.5 mg by mouth every other day.    MITIGARE 0.6 MG CAPS Take by mouth.   OXYGEN Inhale into the lungs. 2.5L at home use   potassium chloride (MICRO-K) 10 MEQ CR capsule TAKE 1 CAPSULE BY  MOUTH ONCE DAILY WITH FOOD   sildenafil (VIAGRA) 50 MG tablet Take by mouth.   sitaGLIPtin (JANUVIA) 25 MG tablet Take 1 tablet (25 mg total) by mouth daily.   torsemide (DEMADEX) 20 MG tablet Take 80 mg by mouth 2 (two) times daily.    vitamin B-12 (CYANOCOBALAMIN) 1000 MCG tablet Take 1,000 mcg by mouth daily.   No facility-administered encounter medications on file as of 10/03/2021.    ALLERGIES:  Allergies  Allergen Reactions   Amoxicillin Hives    Other reaction(s): rash   Other     Other reaction(s): Unknown   Penicillin G     Other reaction(s): rash   Penicillins Hives    Has Brian Parsons had a PCN reaction  causing immediate rash, facial/tongue/throat swelling, SOB or lightheadedness with hypotension: Unknown Has Brian Parsons had a PCN reaction causing severe rash involving mucus membranes or skin necrosis: No Has Brian Parsons had a PCN reaction that required hospitalization No Has Brian Parsons had a PCN reaction occurring within the last 10 years: No If all of the above answers are "NO", then may proceed with Cephalosporin use.      PHYSICAL EXAM:  ECOG PERFORMANCE STATUS: 1 - Symptomatic but completely ambulatory  There were no vitals filed for this visit. There were no vitals filed for this visit. Physical Exam Constitutional:      Appearance: Normal appearance. Brian Parsons is obese.  HENT:     Head: Normocephalic and atraumatic.     Mouth/Throat:     Mouth: Mucous membranes are moist.  Eyes:     Extraocular Movements: Extraocular movements intact.     Pupils: Pupils are equal, round, and reactive to light.  Cardiovascular:     Rate and Rhythm: Normal rate and regular rhythm.     Pulses: Normal pulses.     Heart sounds: Normal heart sounds.  Pulmonary:     Effort: Pulmonary effort is normal.     Breath sounds: Decreased air movement present. Decreased breath sounds present.     Comments: Quiet lungs with poor air movement in all lung fields. Abdominal:     General: Bowel sounds are  normal.     Palpations: Abdomen is soft.     Tenderness: There is no abdominal tenderness.  Musculoskeletal:        General: No swelling.     Right lower leg: No edema.     Left lower leg: No edema.  Lymphadenopathy:     Cervical: No cervical adenopathy.  Skin:    General: Skin is warm and dry.     Coloration: Skin is cyanotic.     Nails: There is clubbing.     Comments: Marked nail clubbing and dusky erythema of fingertips.  Fingertips initially cyanotic due to presenting without his prescribed supplemental oxygen, but improved after oxygen applied.  Neurological:     General: No focal deficit present.     Mental Status: Brian Parsons is alert and oriented to person, place, and time.  Psychiatric:        Mood and Affect: Mood normal.        Behavior: Behavior normal.      LABORATORY DATA:  I have reviewed the labs as listed.  CBC    Component Value Date/Time   WBC 9.3 02/28/2021 0835   RBC 7.20 (H) 02/28/2021 0835   HGB 17.7 (H) 02/28/2021 0835   HCT 61.3 (H) 02/28/2021 0835   PLT 149 (L) 02/28/2021 0835   MCV 85.1 02/28/2021 0835   MCH 24.6 (L) 02/28/2021 0835   MCHC 28.9 (L) 02/28/2021 0835   RDW 21.0 (H) 02/28/2021 0835   LYMPHSABS 2.1 02/28/2021 0835   MONOABS 1.1 (H) 02/28/2021 0835   EOSABS 0.1 02/28/2021 0835   BASOSABS 0.1 02/28/2021 0835      Latest Ref Rng & Units 02/09/2021   10:02 AM 11/11/2020    9:46 AM 05/10/2020   10:20 AM  CMP  Glucose 70 - 99 mg/dL 409  90  87   BUN 8 - 27 mg/dL 63  35  18   Creatinine 0.76 - 1.27 mg/dL 8.11  9.14  7.82   Sodium 134 - 144 mmol/L 137  138  140   Potassium 3.5 - 5.2 mmol/L 3.6  4.2  4.1   Chloride 96 - 106 mmol/L 78  82  84   CO2 20 - 29 mmol/L 40  42  >45   Calcium 8.6 - 10.2 mg/dL 56.4  33.2  9.7   Total Protein 6.0 - 8.5 g/dL 7.3  6.8  6.5   Total Bilirubin 0.0 - 1.2 mg/dL 0.6  0.6  0.7   Alkaline Phos 44 - 121 IU/L 117  106    AST 0 - 40 IU/L 25  17  13    ALT 0 - 44 IU/L 13  13  9      DIAGNOSTIC IMAGING:  I  have independently reviewed the relevant imaging and discussed with the Brian Parsons.  ASSESSMENT & PLAN: 1.  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 - JAK/MPL/CALR mutations were all negative. - No history of DVT, PE, CVA, or MI.  Brian Parsons does have history of CAD s/p left heart catheterization without stents, "had his arteries cleaned out" - Brian Parsons was receiving monthly phlebotomies until Brian Parsons was last to follow-up in August 2021, with goal to maintain hematocrit 52 or less and to avoid severe symptoms such as strokelike symptoms, severe recurrent headache, or severe fatigue. - Brian Parsons continues to take aspirin 81 mg daily - Symptomatic with intermittent dizziness and blurry vision - Labs today (10/03/2021): Hgb 19.0 with hematocrit 65.1% - PLAN: Phlebotomy today, repeat every 6 weeks x2 - will HOLD phlebotomy if HCT < 54.0, as Brian Parsons likely has some appropriate physiologic polycythemia secondary to chronic hypoxic respiratory failure.   - Educated Brian Parsons on the importance of compliance with supplemental oxygen and smoking cessation. - Repeat CBC and RTC in 3 months.  2.  Tobacco abuse: - Brian Parsons has a 57-year 1 pack/day smoking history since age 67.  Brian Parsons continues to smoke 0.5 PPD, trying to cut back. - Low-dose CT lung cancer screening on 01/01/2019 did not reveal any concerns for malignancy.  Emphysema was noted. - PLAN: We will discuss further and order LDCT chest at follow-up visit in 3 months, plan to repeat annually for as long as Brian Parsons is eligible.  3.  Hypoxia - Brian Parsons has chronic hypoxic respiratory failure, on continuous supplemental oxygen 2.5 L via nasal cannula - Reports that the tubing on his portable tank broke, so Brian Parsons came to his appointment today without supplemental oxygen - On presentation, Brian Parsons was cyanotic and hypoxic (oxygen saturation 80%), but appropriately responded to supplemental oxygen - Brian Parsons has an hour to drive home, discussed with Brian Parsons that it is  EXTREMELY risky for him to drive home without supplemental oxygen, and that we would call his oxygen company to see if they could deliver an extra tank to him at the clinic.  Alternatively, we informed him that Brian Parsons could take one of the tanks from the clinic home, but that Brian Parsons would need to return it same day.  Brian Parsons stated that Brian Parsons did not want to wait for [place] to figure out a way to send him home with oxygen and that "Brian Parsons would be fine without it."  We discussed that hypoxia could result in respiratory or cardiac arrest and death.  Brian Parsons verbalized understanding and acceptance of these risks and chose to leave AGAINST MEDICAL ADVICE without supplemental oxygen in place.  Brian Parsons reports that Brian Parsons will have his fiance drive him and assures me that Brian Parsons will not be "behind the wheel."   All questions were answered. The Brian Parsons knows to call the clinic with any problems, questions  or concerns.  Medical decision making: Moderate  Time spent on visit: I spent 20 minutes counseling the Brian Parsons face to face. The total time spent in the appointment was 30 minutes and more than 50% was on counseling.   Carnella Guadalajara, PA-C  10/04/2021 12:06 PM

## 2021-10-03 ENCOUNTER — Inpatient Hospital Stay (HOSPITAL_COMMUNITY): Payer: Medicare HMO | Attending: Hematology

## 2021-10-03 ENCOUNTER — Inpatient Hospital Stay (HOSPITAL_BASED_OUTPATIENT_CLINIC_OR_DEPARTMENT_OTHER): Payer: Medicare HMO | Admitting: Physician Assistant

## 2021-10-03 ENCOUNTER — Telehealth: Payer: Self-pay | Admitting: Internal Medicine

## 2021-10-03 ENCOUNTER — Inpatient Hospital Stay (HOSPITAL_COMMUNITY): Payer: Medicare HMO

## 2021-10-03 ENCOUNTER — Encounter (HOSPITAL_COMMUNITY): Payer: Self-pay

## 2021-10-03 VITALS — BP 109/74 | HR 92 | Temp 98.6°F | Resp 18 | Ht 68.0 in | Wt 202.6 lb

## 2021-10-03 DIAGNOSIS — J9611 Chronic respiratory failure with hypoxia: Secondary | ICD-10-CM | POA: Insufficient documentation

## 2021-10-03 DIAGNOSIS — J449 Chronic obstructive pulmonary disease, unspecified: Secondary | ICD-10-CM | POA: Insufficient documentation

## 2021-10-03 DIAGNOSIS — F17210 Nicotine dependence, cigarettes, uncomplicated: Secondary | ICD-10-CM | POA: Diagnosis not present

## 2021-10-03 DIAGNOSIS — D751 Secondary polycythemia: Secondary | ICD-10-CM | POA: Diagnosis present

## 2021-10-03 DIAGNOSIS — Z7982 Long term (current) use of aspirin: Secondary | ICD-10-CM | POA: Insufficient documentation

## 2021-10-03 LAB — CBC WITH DIFFERENTIAL/PLATELET
Abs Immature Granulocytes: 0.03 10*3/uL (ref 0.00–0.07)
Basophils Absolute: 0.1 10*3/uL (ref 0.0–0.1)
Basophils Relative: 1 %
Eosinophils Absolute: 0.3 10*3/uL (ref 0.0–0.5)
Eosinophils Relative: 3 %
HCT: 65.1 % — ABNORMAL HIGH (ref 39.0–52.0)
Hemoglobin: 19 g/dL — ABNORMAL HIGH (ref 13.0–17.0)
Immature Granulocytes: 0 %
Lymphocytes Relative: 24 %
Lymphs Abs: 2.2 10*3/uL (ref 0.7–4.0)
MCH: 26.8 pg (ref 26.0–34.0)
MCHC: 29.2 g/dL — ABNORMAL LOW (ref 30.0–36.0)
MCV: 91.7 fL (ref 80.0–100.0)
Monocytes Absolute: 1 10*3/uL (ref 0.1–1.0)
Monocytes Relative: 12 %
Neutro Abs: 5.4 10*3/uL (ref 1.7–7.7)
Neutrophils Relative %: 60 %
Platelets: 143 10*3/uL — ABNORMAL LOW (ref 150–400)
RBC: 7.1 MIL/uL — ABNORMAL HIGH (ref 4.22–5.81)
RDW: 19.8 % — ABNORMAL HIGH (ref 11.5–15.5)
WBC: 9 10*3/uL (ref 4.0–10.5)
nRBC: 0 % (ref 0.0–0.2)

## 2021-10-03 NOTE — Telephone Encounter (Signed)
Ok for cataract surgery 

## 2021-10-03 NOTE — Progress Notes (Signed)
Patient presents today for phlebotomy and follow up visit with R. Pennington PA. HGB today 19.0 and HCT 65.1.  Vital signs stable prior to procedure. Procedure started at 13:00 pm and ended at 13:09 pm. 500 mls of blood removed. Patient denies any dizziness , lightheadedness, or feeling faint.  Gauze and coban applied to site. Vital signs stable at completion of procedure. Patient has no complaints at this time. Alert and oriented x 3. Discharged in stable condition.    Patient declined waiting to see about an oxygen tank for her ride home. Patient teaching performed pertaining to 02 saturation and patient states I have what I need at home.02 100 % at discharge with 2.5 Liters by  nasal canula . R. Pennington PA at the bedside. Plan of care discussed with patient. Risks of respiratory distress, cardiac arrest. Patient declined 02.   Treatment given today per MD orders. Tolerated without adverse affects. Vital signs stable. No complaints at this time. Discharged from clinic by wheel chair in stable condition. Alert and oriented x 3. F/U with [facility] as scheduled.

## 2021-10-03 NOTE — Patient Instructions (Signed)
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  Your elevated red blood cells are related to your tobacco use as well as your underlying COPD/lung disease.  We do want you to have a slightly elevated red blood cells (up to hematocrit 55.0) to compensate for your low oxygen state.  However, hematocrit higher than 55.0 places you at increased risk of heart attack, stroke, and blood clots.  Your hematocrit today was 65.0, which is extremely high!  We will schedule you for therapeutic phlebotomy today, and repeat this every 6 weeks.  I will see you for follow-up office visit in 3 months.  Continue to take aspirin 81 mg daily.  The most important thing for you to do is to quit smoking.  It is also extremely important that you wear your supplemental oxygen at all times!  (Otherwise, your oxygen drops too low and your body responds by making more blood cells.)   - - - - - - - - - - - - - - - - -   Thank you for choosing [facility] to provide your oncology and hematology care.  To afford each patient quality time with our provider, please arrive at least 15 minutes before your scheduled appointment time.   If you have a lab appointment with the [facility] please come in thru the Main Entrance and check in at the main information desk.  You need to re-schedule your appointment should you arrive 10 or more minutes late.  We strive to give you quality time with our providers, and arriving late affects you and other patients whose appointments are after yours.  Also, if you no show three or more times for appointments you may be dismissed from the clinic at the providers discretion.     Again, thank you for choosing [facility].  Our hope is that these requests will decrease the amount of time that you wait before being  seen by our physicians.       _____________________________________________________________  Should you have questions after your visit to [facility], please contact our office at [phone] and follow the prompts.  Our office hours are 8:00 a.m. and 4:30 p.m. Monday - Friday.  Please note that voicemails left after 4:00 p.m. may not be returned until the following business day.  We are closed weekends and major holidays.  You do have access to a nurse 24-7, just call the main number to the clinic [phone] and do not press any options, hold on the line and a nurse will answer the phone.    For prescription refill requests, have your pharmacy contact our office and allow 72 hours.    Due to Covid, you will need to wear a mask upon entering the hospital. If you do not have a mask, a mask will be given to you at the Main Entrance upon arrival. For doctor visits, patients may have 1 support person age 58 or older with them. For treatment visits, patients can not have anyone with them due to social distancing guidelines and our immunocompromised population.

## 2021-10-03 NOTE — Progress Notes (Unsigned)
patient came in without oxygen regularly on 2.5L oxygen at home/ hooked patient up to oxygen at 2.5 L and O2 is staying around 91%- Provider aware

## 2021-10-03 NOTE — Patient Instructions (Signed)
[facility]  Discharge Instructions: Thank you for choosing [facility] to provide your oncology and hematology care.  If you have a lab appointment with the [facility], please come in thru the Main Entrance and check in at the main information desk.  Wear comfortable clothing and clothing appropriate for easy access to any Portacath or PICC line.   We strive to give you quality time with your provider. You may need to reschedule your appointment if you arrive late (15 or more minutes).  Arriving late affects you and other patients whose appointments are after yours.  Also, if you miss three or more appointments without notifying the office, you may be dismissed from the clinic at the provider's discretion.      For prescription refill requests, have your pharmacy contact our office and allow 72 hours for refills to be completed.    Today you received the following chemotherapy and/or immunotherapy agents phlebotomy       To help prevent nausea and vomiting after your treatment, we encourage you to take your nausea medication as directed.  BELOW ARE SYMPTOMS THAT SHOULD BE REPORTED IMMEDIATELY: *FEVER GREATER THAN 100.4 F (38 C) OR HIGHER *CHILLS OR SWEATING *NAUSEA AND VOMITING THAT IS NOT CONTROLLED WITH YOUR NAUSEA MEDICATION *UNUSUAL SHORTNESS OF BREATH *UNUSUAL BRUISING OR BLEEDING *URINARY PROBLEMS (pain or burning when urinating, or frequent urination) *BOWEL PROBLEMS (unusual diarrhea, constipation, pain near the anus) TENDERNESS IN MOUTH AND THROAT WITH OR WITHOUT PRESENCE OF ULCERS (sore throat, sores in mouth, or a toothache) UNUSUAL RASH, SWELLING OR PAIN  UNUSUAL VAGINAL DISCHARGE OR ITCHING   Items with * indicate a potential emergency and should be followed up as soon as possible or go to the Emergency Department if any problems should occur.  Please show the CHEMOTHERAPY ALERT CARD or IMMUNOTHERAPY ALERT CARD at check-in to the Emergency  Department and triage nurse.  Should you have questions after your visit or need to cancel or reschedule your appointment, please contact [facility] [phone]  and follow the prompts.  Office hours are 8:00 a.m. to 4:30 p.m. Monday - Friday. Please note that voicemails left after 4:00 p.m. may not be returned until the following business day.  We are closed weekends and major holidays. You have access to a nurse at all times for urgent questions. Please call the main number to the clinic [phone] and follow the prompts.  For any non-urgent questions, you may also contact your provider using MyChart. We now offer e-Visits for anyone 73 and older to request care online for non-urgent symptoms. For details visit [email or web address].   Also download the MyChart app! Go to the app store, search "MyChart", open the app, select [facility], and log in with your MyChart username and password.  Masks are optional in the cancer centers. If you would like for your care team to wear a mask while they are taking care of you, please let them know. For doctor visits, patients may have with them one support person who is at least 73 years old. At this time, visitors are not allowed in the infusion area.

## 2021-10-03 NOTE — Telephone Encounter (Signed)
OV notes and clearance form have been faxed back to [facility]. Nothing further needed at this time. °

## 2021-10-03 NOTE — Telephone Encounter (Signed)
Fax received from Dr. Tama High with [facility] to perform a cataract extraction on patient.  Patient needs surgery clearance. Patient was seen on 08/29/2021. Office protocol is a risk assessment can be sent to surgeon if patient has been seen in 60 days or less.   Sending to Dr. Melvyn Novas for risk assessment or recommendations if patient needs to be seen in office prior to surgical procedure.

## 2021-10-03 NOTE — Telephone Encounter (Signed)
Mandi, is there a form that needs to be completed by Dr. Melvyn Novas?

## 2021-10-04 ENCOUNTER — Encounter (HOSPITAL_COMMUNITY): Payer: Self-pay | Admitting: Hematology

## 2021-10-04 NOTE — Progress Notes (Incomplete)
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  He was last seen by NP Francene Finders on 09/23/2019.  His last phlebotomy was on 11/20/2019, after which time he was lost to follow-up.  At today's visit, he reports feeling fair.  No recent hospitalizations, surgeries, or changes in baseline health status.  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  He is taking aspirin 81 mg daily.  He denies any history of DVT, PE, MI, or CVA, but does note that he had a left heart catheterization and "had his arteries cleaned out" in 2017.  He admits to dusky red discoloration of his fingers, but denies any Raynaud's phenomenon.  No aquagenic pruritus or erythromelalgia.  He does have intermittent dizziness and blurry vision.  He denies any tinnitus, strokelike symptoms, or neuropathy.  No B symptoms such as fever, chills, night sweats, unintentional weight loss.  He denies any abdominal pain, nausea, or early satiety.  He has 70% energy and 100% appetite.  He endorses that he is maintaining a stable weight.   REVIEW OF SYSTEMS:    Review of Systems  Constitutional:  Positive for fatigue. Negative for appetite change, chills, diaphoresis, fever and unexpected weight change.  HENT:   Negative for lump/mass and nosebleeds.   Eyes:  Negative for eye problems.  Respiratory:  Positive for cough and  shortness of breath. Negative for hemoptysis.   Cardiovascular:  Negative for chest pain, leg swelling and palpitations.  Gastrointestinal:  Negative for abdominal pain, blood in stool, constipation, diarrhea, nausea and vomiting.  Genitourinary:  Negative for hematuria.   Skin: Negative.   Neurological:  Positive for dizziness. Negative for headaches and light-headedness.  Hematological:  Does not bruise/bleed easily.  Psychiatric/Behavioral:  Positive for sleep disturbance.      PAST MEDICAL/SURGICAL HISTORY:  Past Medical History:  Diagnosis Date  . Bilateral leg edema   . CHF (congestive heart failure) ([place])   . COPD (chronic obstructive pulmonary disease) ([place])   . Coronary atherosclerosis    Nonobstructive at cardiac catheterization April 2017  . Diabetes mellitus without complication ([place])   . Essential hypertension   . On home O2    2L N/C prn  . Polycythemia    Past Surgical History:  Procedure Laterality Date  . CARDIAC CATHETERIZATION N/A 08/10/2015   Procedure: Left Heart Cath and Coronary Angiography;  Surgeon: Troy Sine, MD;  Location: [facility];  Service: Cardiovascular;  Laterality: N/A;     SOCIAL HISTORY:  Social History   Socioeconomic History  . Marital status: Divorced    Spouse name: Not on file  . Number of children: Not on file  . Years of education: Not on file  . Highest education level: Not on file  Occupational History  . Not on file  Tobacco Use  . Smoking status: Every Day    Packs/day:  1.00    Years: 50.00    Total pack years: 50.00    Types: Cigarettes    Start date: 04/17/1961  . Smokeless tobacco: Never  Vaping Use  . Vaping Use: Never used  Substance and Sexual Activity  . Alcohol use: No    Alcohol/week: 0.0 standard drinks of alcohol    Comment: No EtOH for 7 years  . Drug use: No  . Sexual activity: Yes  Other Topics Concern  . Not on file  Social History Narrative  . Not on file   Social Determinants of  Health   Financial Resource Strain: Not on file  Food Insecurity: Not on file  Transportation Needs: Not on file  Physical Activity: Not on file  Stress: Not on file  Social Connections: Not on file  Intimate Partner Violence: Not on file    FAMILY HISTORY:  Family History  Problem Relation Age of Onset  . Stroke Mother   . Emphysema Father     CURRENT MEDICATIONS:  Outpatient Encounter Medications as of 10/03/2021  Medication Sig  . Accu-Chek Softclix Lancets lancets   . albuterol (ACCUNEB) 0.63 MG/3ML nebulizer solution Take 3 mLs (0.63 mg total) by nebulization every 4 (four) hours as needed.  Marland Kitchen albuterol (VENTOLIN HFA) 108 (90 Base) MCG/ACT inhaler Inhale 2 puffs into the lungs every 6 (six) hours as needed for wheezing or shortness of breath.  . allopurinol (ZYLOPRIM) 100 MG tablet Take 200 mg by mouth 2 (two) times daily.   Marland Kitchen aspirin EC 81 MG tablet Take 81 mg by mouth daily.  Marland Kitchen atorvastatin (LIPITOR) 20 MG tablet Take 20 mg by mouth daily at 6 PM.   . azelastine (ASTELIN) 0.1 % nasal spray 1 puff in each nostril  . azithromycin (ZITHROMAX) 250 MG tablet Take 2 on day one then 1 daily x 4 days  . Blood Glucose Monitoring Suppl (TRUE METRIX METER) DEVI See admin instructions.  . carvedilol (COREG) 3.125 MG tablet Take 1 tablet (3.125 mg total) by mouth 2 (two) times daily with a meal.  . escitalopram (LEXAPRO) 20 MG tablet Take 20 mg by mouth daily.  . fluticasone (FLONASE) 50 MCG/ACT nasal spray 1 spray in each nostril  . Fluticasone-Umeclidin-Vilant (TRELEGY ELLIPTA) 100-62.5-25 MCG/ACT AEPB One click each am  . ipratropium-albuterol (DUONEB) 0.5-2.5 (3) MG/3ML SOLN Take by nebulization.  . Magnesium Gluconate 500 (27 Mg) MG TABS Take 500 mg by mouth in the morning and at bedtime.   . metolazone (ZAROXOLYN) 2.5 MG tablet Take 2.5 mg by mouth every other day.   Marland Kitchen MITIGARE 0.6 MG CAPS Take by mouth.  . OXYGEN Inhale into the lungs. 2.5L at home use  . potassium chloride  (MICRO-K) 10 MEQ CR capsule TAKE 1 CAPSULE BY MOUTH ONCE DAILY WITH FOOD  . sildenafil (VIAGRA) 50 MG tablet Take by mouth.  . sitaGLIPtin (JANUVIA) 25 MG tablet Take 1 tablet (25 mg total) by mouth daily.  Marland Kitchen torsemide (DEMADEX) 20 MG tablet Take 80 mg by mouth 2 (two) times daily.   . vitamin B-12 (CYANOCOBALAMIN) 1000 MCG tablet Take 1,000 mcg by mouth daily.   No facility-administered encounter medications on file as of 10/03/2021.    ALLERGIES:  Allergies  Allergen Reactions  . Amoxicillin Hives    Other reaction(s): rash  . Other     Other reaction(s): Unknown  . Penicillin G     Other reaction(s): rash  . Penicillins Hives    Has patient had a PCN reaction  causing immediate rash, facial/tongue/throat swelling, SOB or lightheadedness with hypotension: Unknown Has patient had a PCN reaction causing severe rash involving mucus membranes or skin necrosis: No Has patient had a PCN reaction that required hospitalization No Has patient had a PCN reaction occurring within the last 10 years: No If all of the above answers are "NO", then may proceed with Cephalosporin use.      PHYSICAL EXAM:  ECOG PERFORMANCE STATUS: 1 - Symptomatic but completely ambulatory  There were no vitals filed for this visit. There were no vitals filed for this visit. Physical Exam Constitutional:      Appearance: Normal appearance. She is obese.  HENT:     Head: Normocephalic and atraumatic.     Mouth/Throat:     Mouth: Mucous membranes are moist.  Eyes:     Extraocular Movements: Extraocular movements intact.     Pupils: Pupils are equal, round, and reactive to light.  Cardiovascular:     Rate and Rhythm: Normal rate and regular rhythm.     Pulses: Normal pulses.     Heart sounds: Normal heart sounds.  Pulmonary:     Effort: Pulmonary effort is normal.     Breath sounds: Decreased air movement present. Decreased breath sounds present.     Comments: Quiet lungs with poor air movement in all  lung fields. Abdominal:     General: Bowel sounds are normal.     Palpations: Abdomen is soft.     Tenderness: There is no abdominal tenderness.  Musculoskeletal:        General: No swelling.     Right lower leg: No edema.     Left lower leg: No edema.  Lymphadenopathy:     Cervical: No cervical adenopathy.  Skin:    General: Skin is warm and dry.     Coloration: Skin is cyanotic.     Nails: There is clubbing.     Comments: Marked nail clubbing and dusky erythema of fingertips.  Fingertips initially cyanotic due to presenting without his prescribed supplemental oxygen, but improved after oxygen applied.  Neurological:     General: No focal deficit present.     Mental Status: She is alert and oriented to person, place, and time.  Psychiatric:        Mood and Affect: Mood normal.        Behavior: Behavior normal.      LABORATORY DATA:  I have reviewed the labs as listed.  CBC    Component Value Date/Time   WBC 9.3 02/28/2021 0835   RBC 7.20 (H) 02/28/2021 0835   HGB 17.7 (H) 02/28/2021 0835   HCT 61.3 (H) 02/28/2021 0835   PLT 149 (L) 02/28/2021 0835   MCV 85.1 02/28/2021 0835   MCH 24.6 (L) 02/28/2021 0835   MCHC 28.9 (L) 02/28/2021 0835   RDW 21.0 (H) 02/28/2021 0835   LYMPHSABS 2.1 02/28/2021 0835   MONOABS 1.1 (H) 02/28/2021 0835   EOSABS 0.1 02/28/2021 0835   BASOSABS 0.1 02/28/2021 0835      Latest Ref Rng & Units 02/09/2021   10:02 AM 11/11/2020    9:46 AM 05/10/2020   10:20 AM  CMP  Glucose 70 - 99 mg/dL 151  90  87   BUN 8 - 27 mg/dL 63  35  18   Creatinine 0.76 - 1.27 mg/dL 2.00  1.76  1.54   Sodium 134 - 144 mmol/L 137  138  140   Potassium 3.5 - 5.2 mmol/L 3.6  4.2  4.1   Chloride 96 - 106 mmol/L 78  82  84   CO2 20 - 29 mmol/L 40  42  >45   Calcium 8.6 - 10.2 mg/dL 10.2  10.1  9.7   Total Protein 6.0 - 8.5 g/dL 7.3  6.8  6.5   Total Bilirubin 0.0 - 1.2 mg/dL 0.6  0.6  0.7   Alkaline Phos 44 - 121 IU/L 117  106    AST 0 - 40 IU/L '25  17  13   '$ ALT  0 - 44 IU/L '13  13  9     '$ DIAGNOSTIC IMAGING:  I have independently reviewed the relevant imaging and discussed with the patient.  ASSESSMENT & PLAN: 1.  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 - JAK/MPL/CALR mutations were all negative. - No history of DVT, PE, CVA, or MI.  He does have history of CAD s/p left heart catheterization without stents, "had his arteries cleaned out" - He was receiving monthly phlebotomies until he was last to follow-up in August 2021, with goal to maintain hematocrit 52 or less and to avoid severe symptoms such as strokelike symptoms, severe recurrent headache, or severe fatigue. - He continues to take aspirin 81 mg daily - Symptomatic with intermittent dizziness and blurry vision - Labs today *** - PLAN: Phlebotomy today, repeat every 6 weeks x2 - will HOLD phlebotomy if HCT < 54.0, as he likely has some appropriate physiologic polycythemia secondary to chronic hypoxic respiratory failure.  *** - Educated patient's on the importance of compliance with supplemental oxygen and smoking cessation. - Repeat CBC and RTC in 3 months.  2.  Tobacco abuse: - Patient has a 37-year 1 pack/day smoking history since age 49.  He continues to smoke 0.5 PPD, trying to cut back. - Low-dose CT lung cancer screening on 01/01/2019 did not reveal any concerns for malignancy.  Emphysema was noted. - PLAN: We will discuss further and order LDCT chest at follow-up visit in 3 months, plan to repeat annually for as long as patient is eligible.  3.  Hypoxia - Patient has chronic hypoxic respiratory failure, on continuous supplemental oxygen 2.5 L via nasal cannula - Reports that the tubing on his portable tank broke, so he came to his appointment today without supplemental oxygen - On presentation, he was cyanotic and hypoxic (oxygen saturation 80%), but appropriately responded to supplemental oxygen - Patient has an hour to drive home, discussed with  patient that it is EXTREMELY risky for him to drive home without supplemental oxygen, and that we would call his oxygen company to see if they could deliver an extra tank to him at the clinic.  Alternatively, we informed him that he could take one of the tanks from the clinic home, but that he would need to return it same day.  Patient stated that he did not want to wait for [place] to figure out a way to send him home with oxygen and that "he would be fine without it."  We discussed that hypoxia could result in respiratory or cardiac arrest and death.  Patient verbalized understanding and acceptance of these risks and chose to leave [facility] without supplemental oxygen in place.  Patient reports that he will have his fiance drive him and assures me that he will not be "behind the wheel."   PLAN SUMMARY & DISPOSITION: ***  All questions were answered. The patient knows to call the clinic with any problems,  questions or concerns.  Medical decision making: ***  Time spent on visit: I spent {CHL ONC TIME VISIT - WGNFA:[id number]} counseling the patient face to face. The total time spent in the appointment was {CHL ONC TIME VISIT - ONGEX:[id number]} and more than 50% was on counseling.   Harriett Rush, PA-C  ***

## 2021-10-06 ENCOUNTER — Other Ambulatory Visit (HOSPITAL_COMMUNITY): Payer: Self-pay | Admitting: Physician Assistant

## 2021-10-19 ENCOUNTER — Other Ambulatory Visit: Payer: Self-pay | Admitting: Nurse Practitioner

## 2021-11-01 LAB — COMPREHENSIVE METABOLIC PANEL
ALT: 17 IU/L (ref 0–44)
AST: 23 IU/L (ref 0–40)
Albumin/Globulin Ratio: 1.5 (ref 1.2–2.2)
Albumin: 4.4 g/dL (ref 3.8–4.8)
Alkaline Phosphatase: 91 IU/L (ref 44–121)
BUN/Creatinine Ratio: 35 — ABNORMAL HIGH (ref 10–24)
BUN: 71 mg/dL — ABNORMAL HIGH (ref 8–27)
Bilirubin Total: 1.2 mg/dL (ref 0.0–1.2)
CO2: 43 mmol/L (ref 20–29)
Calcium: 10.8 mg/dL — ABNORMAL HIGH (ref 8.6–10.2)
Chloride: 77 mmol/L — ABNORMAL LOW (ref 96–106)
Creatinine, Ser: 2.05 mg/dL — ABNORMAL HIGH (ref 0.76–1.27)
Globulin, Total: 3 g/dL (ref 1.5–4.5)
Glucose: 98 mg/dL (ref 70–99)
Potassium: 3.8 mmol/L (ref 3.5–5.2)
Sodium: 138 mmol/L (ref 134–144)
Total Protein: 7.4 g/dL (ref 6.0–8.5)
eGFR: 34 mL/min/{1.73_m2} — ABNORMAL LOW (ref >59)

## 2021-11-14 ENCOUNTER — Inpatient Hospital Stay (HOSPITAL_COMMUNITY): Payer: Medicare HMO

## 2021-11-15 ENCOUNTER — Telehealth: Payer: Self-pay | Admitting: Nurse Practitioner

## 2021-11-15 ENCOUNTER — Ambulatory Visit: Payer: Medicare HMO | Admitting: Nurse Practitioner

## 2021-11-15 DIAGNOSIS — I10 Essential (primary) hypertension: Secondary | ICD-10-CM

## 2021-11-15 DIAGNOSIS — E782 Mixed hyperlipidemia: Secondary | ICD-10-CM

## 2021-11-15 DIAGNOSIS — N1832 Chronic kidney disease, stage 3b: Secondary | ICD-10-CM

## 2021-11-15 DIAGNOSIS — F17200 Nicotine dependence, unspecified, uncomplicated: Secondary | ICD-10-CM

## 2021-11-15 DIAGNOSIS — E559 Vitamin D deficiency, unspecified: Secondary | ICD-10-CM

## 2021-11-15 MED ORDER — SITAGLIPTIN PHOSPHATE 25 MG PO TABS: 25.0000 mg | ORAL_TABLET | Freq: Every day | ORAL | 0 refills | Status: DC

## 2021-11-15 NOTE — Telephone Encounter (Signed)
Patient said the Pharmacy only gave him 30 tablets of his Januvia and he only has 3 tablets left at the pharmacy. Can you call in a refill to [facility]. He had to cancel his appointment today due to sickness. He has rescheduled

## 2021-11-15 NOTE — Telephone Encounter (Signed)
Rx sent 

## 2021-11-22 ENCOUNTER — Inpatient Hospital Stay: Payer: Medicare HMO | Attending: Hematology

## 2021-11-22 ENCOUNTER — Inpatient Hospital Stay: Payer: Medicare HMO

## 2021-11-22 DIAGNOSIS — D751 Secondary polycythemia: Secondary | ICD-10-CM | POA: Insufficient documentation

## 2021-11-22 LAB — CBC
HCT: 64.8 % — ABNORMAL HIGH (ref 39.0–52.0)
Hemoglobin: 19.8 g/dL — ABNORMAL HIGH (ref 13.0–17.0)
MCH: 27.9 pg (ref 26.0–34.0)
MCHC: 30.6 g/dL (ref 30.0–36.0)
MCV: 91.3 fL (ref 80.0–100.0)
Platelets: 136 10*3/uL — ABNORMAL LOW (ref 150–400)
RBC: 7.1 MIL/uL — ABNORMAL HIGH (ref 4.22–5.81)
RDW: 19.6 % — ABNORMAL HIGH (ref 11.5–15.5)
WBC: 9.3 10*3/uL (ref 4.0–10.5)
nRBC: 0 % (ref 0.0–0.2)

## 2021-11-22 NOTE — Progress Notes (Signed)
Brian Parsons  presents for therapeutic phlebotomy per MD orders. Last HGB 19.8 / HCT 64.8 on 11-22-2021 . Vital signs stable prior to procedure. Procedure started at 14:28pm and ended at 14:34 PM. 500 mls of blood removed. Patient denies any dizziness , lightheadedness, or feeling faint.  Gauze and coban applied to site. Vital signs stable at completion of procedure. Patient has no complaints at this time. Alert and oriented x 3.  Discharged from clinic by wheel chair in stable condition. Alert and oriented x 3. F/U with [facility] as scheduled.

## 2021-11-22 NOTE — Patient Instructions (Signed)
[facility]  Discharge Instructions: Thank you for choosing [facility] to provide your oncology and hematology care.  If you have a lab appointment with the [facility], please come in thru the Main Entrance and check in at the main information desk.  Wear comfortable clothing and clothing appropriate for easy access to any Portacath or PICC line.   We strive to give you quality time with your provider. You may need to reschedule your appointment if you arrive late (15 or more minutes).  Arriving late affects you and other patients whose appointments are after yours.  Also, if you miss three or more appointments without notifying the office, you may be dismissed from the clinic at the provider's discretion.      For prescription refill requests, have your pharmacy contact our office and allow 72 hours for refills to be completed.    Today you received the following chemotherapy and/or immunotherapy agents Phlebotomy.  Therapeutic Phlebotomy Discharge Instructions  - Increase your fluid intake over the next 4 hours  - No smoking for 30 minutes  - Avoid using the affected arm (the one you had the blood drawn from) for heavy lifting or other activities.  - You may resume all normal activities after 30 minutes.  You are to notify the office if you experience:   - Persistent dizziness and/or lightheadedness -Uncontrolled or excessive bleeding at the site.        To help prevent nausea and vomiting after your treatment, we encourage you to take your nausea medication as directed.  BELOW ARE SYMPTOMS THAT SHOULD BE REPORTED IMMEDIATELY: *FEVER GREATER THAN 100.4 F (38 C) OR HIGHER *CHILLS OR SWEATING *NAUSEA AND VOMITING THAT IS NOT CONTROLLED WITH YOUR NAUSEA MEDICATION *UNUSUAL SHORTNESS OF BREATH *UNUSUAL BRUISING OR BLEEDING *URINARY PROBLEMS (pain or burning when urinating, or frequent urination) *BOWEL PROBLEMS (unusual diarrhea,  constipation, pain near the anus) TENDERNESS IN MOUTH AND THROAT WITH OR WITHOUT PRESENCE OF ULCERS (sore throat, sores in mouth, or a toothache) UNUSUAL RASH, SWELLING OR PAIN  UNUSUAL VAGINAL DISCHARGE OR ITCHING   Items with * indicate a potential emergency and should be followed up as soon as possible or go to the Emergency Department if any problems should occur.  Please show the CHEMOTHERAPY ALERT CARD or IMMUNOTHERAPY ALERT CARD at check-in to the Emergency Department and triage nurse.  Should you have questions after your visit or need to cancel or reschedule your appointment, please contact [facility] [phone]  and follow the prompts.  Office hours are 8:00 a.m. to 4:30 p.m. Monday - Friday. Please note that voicemails left after 4:00 p.m. may not be returned until the following business day.  We are closed weekends and major holidays. You have access to a nurse at all times for urgent questions. Please call the main number to the clinic [phone] and follow the prompts.  For any non-urgent questions, you may also contact your provider using MyChart. We now offer e-Visits for anyone 31 and older to request care online for non-urgent symptoms. For details visit [email or web address].   Also download the MyChart app! Go to the app store, search "MyChart", open the app, select , and log in with your MyChart username and password.  Masks are optional in the cancer centers. If you would like for your care team to wear a mask while they are taking care of you, please let them know. For doctor visits, patients may have  with them one support person who is at least 73 years old. At this time, visitors are not allowed in the infusion area.

## 2021-12-02 ENCOUNTER — Ambulatory Visit: Payer: Medicare HMO | Admitting: Nurse Practitioner

## 2021-12-02 DIAGNOSIS — E782 Mixed hyperlipidemia: Secondary | ICD-10-CM

## 2021-12-02 DIAGNOSIS — F17200 Nicotine dependence, unspecified, uncomplicated: Secondary | ICD-10-CM

## 2021-12-02 DIAGNOSIS — E559 Vitamin D deficiency, unspecified: Secondary | ICD-10-CM

## 2021-12-02 DIAGNOSIS — N1832 Chronic kidney disease, stage 3b: Secondary | ICD-10-CM

## 2021-12-02 DIAGNOSIS — I10 Essential (primary) hypertension: Secondary | ICD-10-CM

## 2021-12-23 NOTE — Progress Notes (Deleted)
NO SHOW

## 2021-12-26 ENCOUNTER — Inpatient Hospital Stay: Payer: Medicare HMO

## 2021-12-26 ENCOUNTER — Inpatient Hospital Stay (HOSPITAL_COMMUNITY): Payer: Medicare HMO | Attending: Hematology | Admitting: Physician Assistant

## 2021-12-27 ENCOUNTER — Ambulatory Visit (INDEPENDENT_AMBULATORY_CARE_PROVIDER_SITE_OTHER): Payer: Medicare HMO | Admitting: Nurse Practitioner

## 2021-12-27 ENCOUNTER — Encounter: Payer: Self-pay | Admitting: Nurse Practitioner

## 2021-12-27 VITALS — BP 140/92 | HR 88 | Ht 68.0 in | Wt 196.0 lb

## 2021-12-27 DIAGNOSIS — I10 Essential (primary) hypertension: Secondary | ICD-10-CM

## 2021-12-27 DIAGNOSIS — N1832 Chronic kidney disease, stage 3b: Secondary | ICD-10-CM

## 2021-12-27 DIAGNOSIS — F17210 Nicotine dependence, cigarettes, uncomplicated: Secondary | ICD-10-CM

## 2021-12-27 DIAGNOSIS — E559 Vitamin D deficiency, unspecified: Secondary | ICD-10-CM

## 2021-12-27 DIAGNOSIS — E782 Mixed hyperlipidemia: Secondary | ICD-10-CM | POA: Diagnosis not present

## 2021-12-27 DIAGNOSIS — E1122 Type 2 diabetes mellitus with diabetic chronic kidney disease: Secondary | ICD-10-CM

## 2021-12-27 DIAGNOSIS — F17200 Nicotine dependence, unspecified, uncomplicated: Secondary | ICD-10-CM

## 2021-12-27 LAB — POCT GLYCOSYLATED HEMOGLOBIN (HGB A1C): Hemoglobin A1C: 5.5 % (ref 4.0–5.6)

## 2021-12-27 NOTE — Progress Notes (Signed)
12/27/2021, 9:47 AM  Endocrinology follow-up note   Subjective:    Patient ID: Brian Parsons, male    DOB: December 25, 1950.  Brian Parsons is being seen in follow-up after he was seen in consultation for management of currently uncontrolled symptomatic diabetes requested by  [name], MD.   Past Medical History:  Diagnosis Date   Bilateral leg edema    CHF (congestive heart failure) ([place])    COPD (chronic obstructive pulmonary disease) ([place])    Coronary atherosclerosis    Nonobstructive at cardiac catheterization April 2017   Diabetes mellitus without complication ([place])    Essential hypertension    On home O2    2L N/C prn   Polycythemia     Past Surgical History:  Procedure Laterality Date   CARDIAC CATHETERIZATION N/A 08/10/2015   Procedure: Left Heart Cath and Coronary Angiography;  Surgeon: Troy Sine, MD;  Location: [facility];  Service: Cardiovascular;  Laterality: N/A;    Social History   Socioeconomic History   Marital status: Divorced    Spouse name: Not on file   Number of children: Not on file   Years of education: Not on file   Highest education level: Not on file  Occupational History   Not on file  Tobacco Use   Smoking status: Every Day    Packs/day: 1.00    Years: 50.00    Total pack years: 50.00    Types: Cigarettes    Start date: 04/17/1961   Smokeless tobacco: Never  Vaping Use   Vaping Use: Never used  Substance and Sexual Activity   Alcohol use: No    Alcohol/week: 0.0 standard drinks of alcohol    Comment: No EtOH for 7 years   Drug use: No   Sexual activity: Yes  Other Topics Concern   Not on file  Social History Narrative   Not on file   Social Determinants of Health   Financial Resource Strain: Not on file  Food Insecurity: Not on file  Transportation Needs: Not on file  Physical Activity: Not on file  Stress: Not on file   Social Connections: Not on file    Family History  Problem Relation Age of Onset   Stroke Mother    Emphysema Father     Outpatient Encounter Medications as of 12/27/2021  Medication Sig   Accu-Chek Softclix Lancets lancets    albuterol (ACCUNEB) 0.63 MG/3ML nebulizer solution Take 3 mLs (0.63 mg total) by nebulization every 4 (four) hours as needed.   albuterol (VENTOLIN HFA) 108 (90 Base) MCG/ACT inhaler Inhale 2 puffs into the lungs every 6 (six) hours as needed for wheezing or shortness of breath.   allopurinol (ZYLOPRIM) 100 MG tablet Take 200 mg by mouth 2 (two) times daily.    aspirin EC 81 MG tablet Take 81 mg by mouth daily.   azelastine (ASTELIN) 0.1 % nasal spray 1 puff in each nostril   Blood Glucose Monitoring Suppl (TRUE METRIX METER) DEVI See admin instructions.   DULoxetine (CYMBALTA) 30 MG capsule Take 30 mg by mouth daily.  fluticasone (FLONASE) 50 MCG/ACT nasal spray 1 spray in each nostril   Fluticasone-Umeclidin-Vilant (TRELEGY ELLIPTA) 100-62.5-25 MCG/ACT AEPB One click each am   ipratropium-albuterol (DUONEB) 0.5-2.5 (3) MG/3ML SOLN Take by nebulization.   LINZESS 145 MCG CAPS capsule Take 145 mcg by mouth daily.   Magnesium Gluconate 500 (27 Mg) MG TABS Take 500 mg by mouth in the morning and at bedtime.    metolazone (ZAROXOLYN) 2.5 MG tablet Take 2.5 mg by mouth every other day.    MITIGARE 0.6 MG CAPS Take by mouth.   OXYGEN Inhale into the lungs. 2.5L at home use   potassium chloride (MICRO-K) 10 MEQ CR capsule TAKE 1 CAPSULE BY MOUTH ONCE DAILY WITH FOOD   prednisoLONE acetate (PRED FORTE) 1 % ophthalmic suspension SMARTSIG:In Eye(s)   sildenafil (VIAGRA) 50 MG tablet Take by mouth.   SIMBRINZA 1-0.2 % SUSP Apply to eye.   torsemide (DEMADEX) 20 MG tablet Take 80 mg by mouth 2 (two) times daily.    vitamin B-12 (CYANOCOBALAMIN) 1000 MCG tablet Take 1,000 mcg by mouth daily.   [DISCONTINUED] sitaGLIPtin (JANUVIA) 25 MG tablet Take 1 tablet (25 mg total)  by mouth daily.   atorvastatin (LIPITOR) 20 MG tablet Take 20 mg by mouth daily at 6 PM.    azithromycin (ZITHROMAX) 250 MG tablet Take 2 on day one then 1 daily x 4 days (Patient not taking: Reported on 12/27/2021)   carvedilol (COREG) 3.125 MG tablet Take 1 tablet (3.125 mg total) by mouth 2 (two) times daily with a meal.   escitalopram (LEXAPRO) 20 MG tablet Take 20 mg by mouth daily. (Patient not taking: Reported on 12/27/2021)   No facility-administered encounter medications on file as of 12/27/2021.    ALLERGIES: Allergies  Allergen Reactions   Amoxicillin Hives    Other reaction(s): rash   Other     Other reaction(s): Unknown   Penicillin G     Other reaction(s): rash   Penicillins Hives    Has patient had a PCN reaction causing immediate rash, facial/tongue/throat swelling, SOB or lightheadedness with hypotension: Unknown Has patient had a PCN reaction causing severe rash involving mucus membranes or skin necrosis: No Has patient had a PCN reaction that required hospitalization No Has patient had a PCN reaction occurring within the last 10 years: No If all of the above answers are "NO", then may proceed with Cephalosporin use.     VACCINATION STATUS:  There is no immunization history on file for this patient.  Diabetes He presents for his follow-up diabetic visit. He has type 2 diabetes mellitus. Onset time: He was diagnosed few months ago at approximate age of 56 years. His disease course has been stable. There are no hypoglycemic associated symptoms. Pertinent negatives for hypoglycemia include no confusion, headaches, pallor or seizures. Pertinent negatives for diabetes include no chest pain, no fatigue, no polydipsia, no polyphagia and no polyuria. There are no hypoglycemic complications. Symptoms are stable. Diabetic complications include heart disease and nephropathy. Risk factors for coronary artery disease include dyslipidemia, diabetes mellitus, family history, male sex,  obesity, tobacco exposure, sedentary lifestyle and hypertension. Current diabetic treatment includes oral agent (monotherapy). He is compliant with treatment all of the time. His weight is decreasing steadily. He is following a generally healthy diet. Meal planning includes avoidance of concentrated sweets. He has not had a previous visit with a dietitian. He never participates in exercise. His home blood glucose trend is fluctuating minimally. His breakfast blood glucose range is generally 110-130  mg/dl. (He presents today with his logs showing at goal glycemic profile overall.  His POCT A1c today is 5.5%, stable.  He denies any hypoglycemia. ) He does not see a podiatrist.Eye exam is not current.  Hyperlipidemia This is a chronic problem. The current episode started more than 1 year ago. The problem is controlled. Recent lipid tests were reviewed and are normal. Exacerbating diseases include chronic renal disease, diabetes and obesity. Factors aggravating his hyperlipidemia include fatty foods, smoking and beta blockers. Pertinent negatives include no chest pain, myalgias or shortness of breath. Current antihyperlipidemic treatment includes statins. The current treatment provides moderate improvement of lipids. There are no compliance problems.  Risk factors for coronary artery disease include diabetes mellitus, dyslipidemia, hypertension, male sex and a sedentary lifestyle.  Hypertension This is a chronic problem. The current episode started more than 1 year ago. The problem has been gradually improving since onset. The problem is controlled. Pertinent negatives include no chest pain, headaches, palpitations or shortness of breath. There are no associated agents to hypertension. Risk factors for coronary artery disease include diabetes mellitus, dyslipidemia, male gender, obesity, smoking/tobacco exposure and sedentary lifestyle. Past treatments include diuretics and beta blockers. The current treatment  provides moderate improvement. There are no compliance problems.  Hypertensive end-organ damage includes kidney disease and heart failure. Identifiable causes of hypertension include chronic renal disease.    Review of systems  Constitutional: + Minimally fluctuating body weight,  current Body mass index is 29.8 kg/m. , no fatigue, no subjective hyperthermia, no subjective hypothermia Eyes: no blurry vision, no xerophthalmia ENT: no sore throat, no nodules palpated in throat, no dysphagia/odynophagia, no hoarseness Cardiovascular: no chest pain, no shortness of breath, no palpitations, no leg swelling Respiratory: no cough, no shortness of breath Gastrointestinal: no nausea/vomiting/diarrhea Musculoskeletal: no muscle/joint aches Skin: no rashes, + hyperemia to BLE, feet and hands purple (says this recently started) Neurological: no tremors, no numbness, no tingling, no dizziness Psychiatric: no depression, no anxiety   Objective:    BP (!) 140/92 (BP Location: Right Arm, Patient Position: Sitting, Cuff Size: Normal)   Pulse 88   Ht '5\' 8"'$  (1.727 m)   Wt 196 lb (88.9 kg)   BMI 29.80 kg/m   Wt Readings from Last 3 Encounters:  12/27/21 196 lb (88.9 kg)  10/03/21 202 lb 9.6 oz (91.9 kg)  08/29/21 199 lb 12.8 oz (90.6 kg)    BP Readings from Last 3 Encounters:  12/27/21 (!) 140/92  11/22/21 99/67  10/03/21 112/67     Physical Exam- Limited  Constitutional:  Body mass index is 29.8 kg/m. , not in acute distress, normal state of mind Eyes:  EOMI, no exophthalmos Neck: Supple Cardiovascular: RRR, no murmurs, rubs, or gallops, no edema Respiratory: Adequate breathing efforts, no crackles, rales, rhonchi, or wheezing Musculoskeletal: no gross deformities, strength intact in all four extremities, no gross restriction of joint movements Skin:  no rashes, + hyperemia to BLE and hands- says recently started- delayed cap refill Neurological: no tremor with outstretched  hands   Diabetic Foot Exam - Simple   Simple Foot Form Diabetic Foot exam was performed with the following findings: Yes 12/27/2021  9:40 AM  Visual Inspection See comments: Yes Sensation Testing Intact to touch and monofilament testing bilaterally: Yes Pulse Check See comments: Yes Comments Diminished posterior tibialis and dorsalis pulses bilaterally.  Delayed cap refill bilaterally.  Signs of poor vascularization.      CMP ( most recent) CMP     Component  Value Date/Time   NA 138 10/31/2021 0836   K 3.8 10/31/2021 0836   CL 77 (L) 10/31/2021 0836   CO2 43 (HH) 10/31/2021 0836   GLUCOSE 98 10/31/2021 0836   GLUCOSE 87 05/10/2020 1020   BUN 71 (H) 10/31/2021 0836   CREATININE 2.05 (H) 10/31/2021 0836   CREATININE 1.54 (H) 05/10/2020 1020   CALCIUM 10.8 (H) 10/31/2021 0836   PROT 7.4 10/31/2021 0836   ALBUMIN 4.4 10/31/2021 0836   AST 23 10/31/2021 0836   ALT 17 10/31/2021 0836   ALKPHOS 91 10/31/2021 0836   BILITOT 1.2 10/31/2021 0836   GFRNONAA 45 (L) 05/10/2020 1020   GFRAA 52 (L) 05/10/2020 1020     Diabetic Labs (most recent): Lab Results  Component Value Date   HGBA1C 5.5 12/27/2021   HGBA1C 5.4 05/18/2021   HGBA1C 5.7 05/17/2020   MICROALBUR 3.4 07/10/2019     Lipid Panel ( most recent) Lipid Panel     Component Value Date/Time   CHOL 106 11/11/2020 0946   TRIG 118 11/11/2020 0946   HDL 33 (L) 11/11/2020 0946   CHOLHDL 3.2 11/11/2020 0946   CHOLHDL 3.3 07/10/2019 1051   VLDL 29 07/30/2015 0441   LDLCALC 51 11/11/2020 0946   LDLCALC 42 07/10/2019 1051   LABVLDL 22 11/11/2020 0946      Lab Results  Component Value Date   TSH 1.560 11/11/2020   TSH 1.45 07/10/2019   TSH 1.098 08/03/2014   FREET4 1.20 11/11/2020   FREET4 1.1 07/10/2019      Assessment & Plan:   1) Type 2 diabetes mellitus with stage 3a chronic kidney disease, without long-term current use of insulin (HCC)  - Rudransh Bellanca has currently uncontrolled symptomatic type 2  DM since  71 years of age.  He presents today with his logs showing at goal glycemic profile overall.  His POCT A1c today is 5.5%, stable.  He denies any hypoglycemia.  - I had a long discussion with him about the progressive nature of diabetes and the pathology behind its complications. -his diabetes is complicated by CKD, obesity/sedentary life, chronic heavy smoking and he remains at a high risk for more acute and chronic complications which include CAD, CVA, CKD, retinopathy, and neuropathy. These are all discussed in detail with him.  - Nutritional counseling repeated at each appointment due to patients tendency to fall back in to old habits.  - The patient admits there is a room for improvement in their diet and drink choices. -  Suggestion is made for the patient to avoid simple carbohydrates from their diet including Cakes, Sweet Desserts / Pastries, Ice Cream, Soda (diet and regular), Sweet Tea, Candies, Chips, Cookies, Sweet Pastries, Store Bought Juices, Alcohol in Excess of 1-2 drinks a day, Artificial Sweeteners, Coffee Creamer, and "Sugar-free" Products. This will help patient to have stable blood glucose profile and potentially avoid unintended weight gain.   - I encouraged the patient to switch to unprocessed or minimally processed complex starch and increased protein intake (animal or plant source), fruits, and vegetables.   - Patient is advised to stick to a routine mealtimes to eat 3 meals a day and avoid unnecessary snacks (to snack only to correct hypoglycemia).  - I have approached him with the following individualized plan to manage  his diabetes and patient agrees:   -Based on his stable glycemic profile, I discontinued his Januvia altogether today.    -He is encouraged to continue monitoring blood glucose once daily before  breakfast and to call the clinic if he has readings less than 70 or above 200 for 3 tests in a row.   - he is not a candidate for metformin, SGLT2  inhibitors due to concurrent renal insufficiency.  -He will not be considered for GLP-1 receptor agonists due to his heavy smoking history.  - Specific targets for  A1c;  LDL, HDL, Triglycerides, and  Waist Circumference were discussed with the patient.  2) Blood Pressure /Hypertension:  His blood pressure is controlled to target.   he is advised to continue his current medications including Carvedilol 3.125 mg p.o. nightly, Metolazone 2.5 mg QOD, Torsemide 80 mg po BID.    3) Lipids/Hyperlipidemia:    Review of his recent lipid panel from 11/11/20 showed controlled LDL at 51 .  he is advised to continue Atorvastatin 20 mg p.o. daily at bedtime.  Side effects and precautions discussed with him.  He had labs done with his PCP recently.  Will request records.  4)  Weight/Diet:  His Body mass index is 29.8 kg/m.  -   clearly complicating his diabetes care.   he is  a candidate for weight loss. I discussed with him the fact that loss of 5 - 10% of his  current body weight will have the most impact on his diabetes management.  Exercise, and detailed carbohydrates information provided  -  detailed on discharge instructions.  5) Vitamin D deficiency:  His most recent vitamin D level was 44.3 on 12/29/19.  He is not currently on any supplementation at this time.  6) Chronic Care/Health Maintenance: -he is on Statin medications and is encouraged to initiate and continue to follow up with Ophthalmology, Dentist, Podiatrist at least yearly or according to recommendations, and advised to  quit smoking. I have recommended yearly flu vaccine and pneumonia vaccine at least every 5 years; moderate intensity exercise for up to 150 minutes weekly; and  sleep for at least 7 hours a day.  -He is counseled extensively for smoking cessation.  - he is advised to maintain close follow up with [name], MD for primary care needs, as well as his other providers for optimal and coordinated care.     I spent 44  minutes in the care of the patient today including review of labs from [facility], Lipids, Thyroid Function, Hematology (current and previous including abstractions from other facilities); face-to-face time discussing  his blood glucose readings/logs, discussing hypoglycemia and hyperglycemia episodes and symptoms, medications doses, his options of short and long term treatment based on the latest standards of care / guidelines;  discussion about incorporating lifestyle medicine;  and documenting the encounter. Risk reduction counseling performed per USPSTF guidelines to reduce obesity and cardiovascular risk factors.     Please refer to Patient Instructions for Blood Glucose Monitoring and Insulin/Medications Dosing Guide"  in media tab for additional information. Please  also refer to " Patient Self Inventory" in the Media  tab for reviewed elements of pertinent patient history.  Lunette Stands participated in the discussions, expressed understanding, and voiced agreement with the above plans.  All questions were answered to his satisfaction. he is encouraged to contact clinic should he have any questions or concerns prior to his return visit.   Follow up plan: - Return in about 4 months (around 04/28/2022) for Diabetes F/U with A1c in office, No previsit labs.  Rayetta Pigg, [facility] [address] [place], [place] [address] Phone: [phone] Fax: [phone]  12/27/2021, 9:47  AM    

## 2021-12-28 DIAGNOSIS — I2723 Pulmonary hypertension due to lung diseases and hypoxia: Secondary | ICD-10-CM | POA: Insufficient documentation

## 2022-01-06 ENCOUNTER — Telehealth: Payer: Self-pay | Admitting: Internal Medicine

## 2022-01-06 DIAGNOSIS — J449 Chronic obstructive pulmonary disease, unspecified: Secondary | ICD-10-CM

## 2022-01-06 NOTE — Telephone Encounter (Signed)
Patient states [employer] is sending a form that needs to be faxed back with a script for his oxygen- Patient thinks it would go to the [place] office. Please advise, call patient back with update [phone].

## 2022-01-09 NOTE — Telephone Encounter (Signed)
Spoke to patient and let him know we havent received a paper from [employer] yet but will message [employer] staff to follow up.

## 2022-01-21 NOTE — Progress Notes (Deleted)
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 Kats 71 y.o. 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  He was last seen by Tarri Abernethy PA-C on 10/03/2021.  At today's visit, he reports feeling fair.  ***  No recent hospitalizations, surgeries, or changes in baseline health status.  Since last visit, he had phlebotomy on 10/03/2021 and 11/22/2021, and reports that he felt *** after phlebotomy.  Regarding his secondary polycythemia from tobacco use and COPD, he reports that he is trying to cut back on smoking, is down to about 0.5 PPD (from 1.5 PPD); he has been smoking since age 79.  *** *** He is taking aspirin 81 mg daily. *** He denies any history of DVT, PE, MI, or CVA, but does note that he had a left heart catheterization and "had his arteries cleaned out" in 2017. *** He admits to dusky red discoloration of his fingers, but denies any Raynaud's phenomenon. *** No aquagenic pruritus or erythromelalgia. *** He does have intermittent dizziness and blurry vision.   *** He denies any tinnitus, strokelike symptoms, or neuropathy. *** No B symptoms such as fever, chills, night sweats, unintentional weight loss. *** He denies any abdominal pain, nausea, or early satiety.  He has ***% energy and ***% appetite.  He endorses that he is maintaining a stable weight.   REVIEW OF SYSTEMS:   *** Review of Systems  Constitutional:  Positive for fatigue. Negative for appetite change, chills, diaphoresis, fever and unexpected weight change.  HENT:   Negative for lump/mass and nosebleeds.   Eyes:   Negative for eye problems.  Respiratory:  Positive for cough and shortness of breath. Negative for hemoptysis.   Cardiovascular:  Negative for chest pain, leg swelling and palpitations.  Gastrointestinal:  Negative for abdominal pain, blood in stool, constipation, diarrhea, nausea and vomiting.  Genitourinary:  Negative for hematuria.   Skin: Negative.   Neurological:  Positive for dizziness. Negative for headaches and light-headedness.  Hematological:  Does not bruise/bleed easily.  Psychiatric/Behavioral:  Positive for sleep disturbance.      PAST MEDICAL/SURGICAL HISTORY:  Past Medical History:  Diagnosis Date   Bilateral leg edema    CHF (congestive heart failure) (HCC)    COPD (chronic obstructive pulmonary disease) ([facility])    Coronary atherosclerosis    Nonobstructive at cardiac catheterization April 2017   Diabetes mellitus without complication ([facility])    Essential hypertension    On home O2    2L N/C prn   Polycythemia    Past Surgical History:  Procedure Laterality Date   CARDIAC CATHETERIZATION N/A 08/10/2015   Procedure: Left Heart Cath and Coronary Angiography;  Surgeon: Troy Sine, MD;  Location: [facility];  Service: Cardiovascular;  Laterality: N/A;     SOCIAL HISTORY:  Social History   Socioeconomic History   Marital status: Divorced    Spouse name: Not on file   Number of children: Not on file   Years of education: Not on file   Highest education level: Not on file  Occupational History   Not on file  Tobacco Use   Smoking status:  Every Day    Packs/day: 1.00    Years: 50.00    Total pack years: 50.00    Types: Cigarettes    Start date: 04/17/1961   Smokeless tobacco: Never  Vaping Use   Vaping Use: Never used  Substance and Sexual Activity   Alcohol use: No    Alcohol/week: 0.0 standard drinks of alcohol    Comment: No EtOH for 7 years   Drug use: No   Sexual activity: Yes  Other Topics Concern   Not on file  Social History Narrative    Not on file   Social Determinants of Health   Financial Resource Strain: Not on file  Food Insecurity: Not on file  Transportation Needs: Not on file  Physical Activity: Not on file  Stress: Not on file  Social Connections: Not on file  Intimate Partner Violence: Not on file    FAMILY HISTORY:  Family History  Problem Relation Age of Onset   Stroke Mother    Emphysema Father     CURRENT MEDICATIONS:  Outpatient Encounter Medications as of 01/23/2022  Medication Sig   Accu-Chek Softclix Lancets lancets    albuterol (ACCUNEB) 0.63 MG/3ML nebulizer solution Take 3 mLs (0.63 mg total) by nebulization every 4 (four) hours as needed.   albuterol (VENTOLIN HFA) 108 (90 Base) MCG/ACT inhaler Inhale 2 puffs into the lungs every 6 (six) hours as needed for wheezing or shortness of breath.   allopurinol (ZYLOPRIM) 100 MG tablet Take 200 mg by mouth 2 (two) times daily.    aspirin EC 81 MG tablet Take 81 mg by mouth daily.   atorvastatin (LIPITOR) 20 MG tablet Take 20 mg by mouth daily at 6 PM.    azelastine (ASTELIN) 0.1 % nasal spray 1 puff in each nostril   azithromycin (ZITHROMAX) 250 MG tablet Take 2 on day one then 1 daily x 4 days (Patient not taking: Reported on 12/27/2021)   Blood Glucose Monitoring Suppl (TRUE METRIX METER) DEVI See admin instructions.   carvedilol (COREG) 3.125 MG tablet Take 1 tablet (3.125 mg total) by mouth 2 (two) times daily with a meal.   DULoxetine (CYMBALTA) 30 MG capsule Take 30 mg by mouth daily.   escitalopram (LEXAPRO) 20 MG tablet Take 20 mg by mouth daily. (Patient not taking: Reported on 12/27/2021)   fluticasone (FLONASE) 50 MCG/ACT nasal spray 1 spray in each nostril   Fluticasone-Umeclidin-Vilant (TRELEGY ELLIPTA) 100-62.5-25 MCG/ACT AEPB One click each am   ipratropium-albuterol (DUONEB) 0.5-2.5 (3) MG/3ML SOLN Take by nebulization.   LINZESS 145 MCG CAPS capsule Take 145 mcg by mouth daily.   Magnesium Gluconate 500 (27 Mg) MG TABS Take 500  mg by mouth in the morning and at bedtime.    metolazone (ZAROXOLYN) 2.5 MG tablet Take 2.5 mg by mouth every other day.    MITIGARE 0.6 MG CAPS Take by mouth.   OXYGEN Inhale into the lungs. 2.5L at home use   potassium chloride (MICRO-K) 10 MEQ CR capsule TAKE 1 CAPSULE BY MOUTH ONCE DAILY WITH FOOD   prednisoLONE acetate (PRED FORTE) 1 % ophthalmic suspension SMARTSIG:In Eye(s)   sildenafil (VIAGRA) 50 MG tablet Take by mouth.   SIMBRINZA 1-0.2 % SUSP Apply to eye.   torsemide (DEMADEX) 20 MG tablet Take 80 mg by mouth 2 (two) times daily.    vitamin B-12 (CYANOCOBALAMIN) 1000 MCG tablet Take 1,000 mcg by mouth daily.   No facility-administered encounter medications on file as of 01/23/2022.    ALLERGIES:  Allergies  Allergen Reactions   Amoxicillin Hives    Other reaction(s): rash   Other     Other reaction(s): Unknown   Penicillin G     Other reaction(s): rash   Penicillins Hives    Has patient had a PCN reaction causing immediate rash, facial/tongue/throat swelling, SOB or lightheadedness with hypotension: Unknown Has patient had a PCN reaction causing severe rash involving mucus membranes or skin necrosis: No Has patient had a PCN reaction that required hospitalization No Has patient had a PCN reaction occurring within the last 10 years: No If all of the above answers are "NO", then may proceed with Cephalosporin use.      PHYSICAL EXAM:  *** ECOG PERFORMANCE STATUS: 1 - Symptomatic but completely ambulatory  There were no vitals filed for this visit. There were no vitals filed for this visit. Physical Exam Constitutional:      Appearance: Normal appearance. He is obese.  HENT:     Head: Normocephalic and atraumatic.     Mouth/Throat:     Mouth: Mucous membranes are moist.  Eyes:     Extraocular Movements: Extraocular movements intact.     Pupils: Pupils are equal, round, and reactive to light.  Cardiovascular:     Rate and Rhythm: Normal rate and regular  rhythm.     Pulses: Normal pulses.     Heart sounds: Normal heart sounds.  Pulmonary:     Effort: Pulmonary effort is normal.     Breath sounds: Decreased air movement present. Decreased breath sounds present.     Comments: Quiet lungs with poor air movement in all lung fields. Abdominal:     General: Bowel sounds are normal.     Palpations: Abdomen is soft.     Tenderness: There is no abdominal tenderness.  Musculoskeletal:        General: No swelling.     Right lower leg: No edema.     Left lower leg: No edema.  Lymphadenopathy:     Cervical: No cervical adenopathy.  Skin:    General: Skin is warm and dry.     Coloration: Skin is cyanotic.     Nails: There is clubbing.     Comments: Marked nail clubbing and dusky erythema of fingertips.  Fingertips initially cyanotic due to presenting without his prescribed supplemental oxygen, but improved after oxygen applied.  Neurological:     General: No focal deficit present.     Mental Status: He is alert and oriented to person, place, and time.  Psychiatric:        Mood and Affect: Mood normal.        Behavior: Behavior normal.      LABORATORY DATA:  I have reviewed the labs as listed.  CBC    Component Value Date/Time   WBC 9.3 11/22/2021 1258   RBC 7.10 (H) 11/22/2021 1258   HGB 19.8 (H) 11/22/2021 1258   HCT 64.8 (H) 11/22/2021 1258   PLT 136 (L) 11/22/2021 1258   MCV 91.3 11/22/2021 1258   MCH 27.9 11/22/2021 1258   MCHC 30.6 11/22/2021 1258   RDW 19.6 (H) 11/22/2021 1258   LYMPHSABS 2.2 10/03/2021 1126   MONOABS 1.0 10/03/2021 1126   EOSABS 0.3 10/03/2021 1126   BASOSABS 0.1 10/03/2021 1126      Latest Ref Rng & Units 10/31/2021    8:36 AM 02/09/2021   10:02 AM 11/11/2020    9:46 AM  CMP  Glucose 70 - 99 mg/dL 98  151  90   BUN 8 - 27 mg/dL 71  63  35   Creatinine 0.76 - 1.27 mg/dL 2.05  2.00  1.76   Sodium 134 - 144 mmol/L 138  137  138   Potassium 3.5 - 5.2 mmol/L 3.8  3.6  4.2   Chloride 96 - 106 mmol/L 77   78  82   CO2 20 - 29 mmol/L 43  40  42   Calcium 8.6 - 10.2 mg/dL 10.8  10.2  10.1   Total Protein 6.0 - 8.5 g/dL 7.4  7.3  6.8   Total Bilirubin 0.0 - 1.2 mg/dL 1.2  0.6  0.6   Alkaline Phos 44 - 121 IU/L 91  117  106   AST 0 - 40 IU/L '23  25  17   '$ ALT 0 - 44 IU/L '17  13  13     '$ DIAGNOSTIC IMAGING:  I have independently reviewed the relevant imaging and discussed with the patient.  1.  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 - JAK/MPL/CALR mutations were all negative. - No history of DVT, PE, CVA, or MI.  He does have history of CAD s/p left heart catheterization without stents, "had his arteries cleaned out" - Has been receiving phlebotomies every 6 weeks, most recently on 11/22/2021 *** - Goal of phlebotomies to maintain hematocrit <54.0 to avoid severe symptoms such as strokelike symptoms, severe recurrent headache, or severe fatigue - Feels *** after phlebotomies - He continues to take aspirin 81 mg daily - Symptomatic with intermittent dizziness and blurry vision*** - Labs today*** - PLAN: Phlebotomy today, repeat every 6 weeks x2 - will HOLD phlebotomy if HCT < 54.0, as he likely has some appropriate physiologic polycythemia secondary to chronic hypoxic respiratory failure.   ***  - Educated patient on the importance of compliance with supplemental oxygen and smoking cessation. ***  - Repeat CBC and RTC in 3 months.  2.  Tobacco abuse: - Patient has a 17-year 1 pack/day smoking history since age 2.  He continues to smoke 0.5 PPD, trying to cut back. ***  - Low-dose CT lung cancer screening on 01/01/2019 did not reveal any concerns for malignancy.  Emphysema was noted. - PLAN: We will discuss further and order LDCT chest at follow-up visit in 3 months, plan to repeat annually for as long as patient is eligible. ***   3.  Hypoxia ***  - Patient has chronic hypoxic respiratory failure, on continuous supplemental oxygen 2.5 L via nasal cannula -  Reports that the tubing on his portable tank broke, so he came to his appointment today without supplemental oxygen - On presentation, he was cyanotic and hypoxic (oxygen saturation 80%), but appropriately responded to supplemental oxygen - Patient has an hour to drive home, discussed with patient that it is EXTREMELY risky for him to drive home without supplemental oxygen, and that we would call his oxygen company to see if they could deliver an extra tank to him at the clinic.  Alternatively, we informed him that he could take one of the tanks from the clinic home, but that he would need to return it same day.  Patient stated that he did not want to wait for [place] to figure out a way to send him home with oxygen and that "he would be fine without it."  We discussed that hypoxia could result in respiratory or cardiac arrest and death.  Patient verbalized understanding and acceptance of these risks and  chose to leave [place] without supplemental oxygen in place.  Patient reports that he will have his fiance drive him and assures me that he will not be "behind the wheel."   All questions were answered. The patient knows to call the clinic with any problems, questions or concerns.  Medical decision making: Moderate ***  Time spent on visit: I spent *** minutes counseling the patient face to face. The total time spent in the appointment was *** minutes and more than 50% was on counseling.   Harriett Rush, PA-C  ***

## 2022-01-23 ENCOUNTER — Inpatient Hospital Stay: Payer: Medicare HMO | Admitting: Physician Assistant

## 2022-01-23 ENCOUNTER — Inpatient Hospital Stay: Payer: Medicare HMO

## 2022-01-25 NOTE — Telephone Encounter (Signed)
Chris from [facility] called and just needs currently o2 flow sent to [employer] at fax [phone] or uploaded to Outlook. Chris's call back number is [phone]. Please advise.

## 2022-01-25 NOTE — Telephone Encounter (Signed)
LOV note faxed to [phone] with last O2 notes. Patient is using 2.5LO2. Nothing further needed for now

## 2022-02-10 ENCOUNTER — Other Ambulatory Visit: Payer: Medicare HMO

## 2022-02-10 ENCOUNTER — Inpatient Hospital Stay: Payer: Medicare HMO

## 2022-02-10 ENCOUNTER — Inpatient Hospital Stay: Payer: Medicare HMO | Attending: Hematology

## 2022-02-10 ENCOUNTER — Ambulatory Visit: Payer: Medicare HMO | Admitting: Physician Assistant

## 2022-02-10 DIAGNOSIS — D751 Secondary polycythemia: Secondary | ICD-10-CM | POA: Insufficient documentation

## 2022-02-10 LAB — CBC
HCT: 65.1 % — ABNORMAL HIGH (ref 39.0–52.0)
Hemoglobin: 20.5 g/dL — ABNORMAL HIGH (ref 13.0–17.0)
MCH: 29.6 pg (ref 26.0–34.0)
MCHC: 31.5 g/dL (ref 30.0–36.0)
MCV: 93.9 fL (ref 80.0–100.0)
Platelets: 131 10*3/uL — ABNORMAL LOW (ref 150–400)
RBC: 6.93 MIL/uL — ABNORMAL HIGH (ref 4.22–5.81)
RDW: 17.9 % — ABNORMAL HIGH (ref 11.5–15.5)
WBC: 12.3 10*3/uL — ABNORMAL HIGH (ref 4.0–10.5)
nRBC: 0 % (ref 0.0–0.2)

## 2022-02-10 NOTE — Patient Instructions (Signed)
[facility]  Discharge Instructions: Thank you for choosing [facility] to provide your oncology and hematology care.  If you have a lab appointment with the Cancer Center, please come in thru the Main Entrance and check in at the main information desk.  Wear comfortable clothing and clothing appropriate for easy access to any Portacath or PICC line.   We strive to give you quality time with your provider. You may need to reschedule your appointment if you arrive late (15 or more minutes).  Arriving late affects you and other patients whose appointments are after yours.  Also, if you miss three or more appointments without notifying the office, you may be dismissed from the clinic at the provider's discretion.      For prescription refill requests, have your pharmacy contact our office and allow 72 hours for refills to be completed.     To help prevent nausea and vomiting after your treatment, we encourage you to take your nausea medication as directed.  BELOW ARE SYMPTOMS THAT SHOULD BE REPORTED IMMEDIATELY: *FEVER GREATER THAN 100.4 F (38 C) OR HIGHER *CHILLS OR SWEATING *NAUSEA AND VOMITING THAT IS NOT CONTROLLED WITH YOUR NAUSEA MEDICATION *UNUSUAL SHORTNESS OF BREATH *UNUSUAL BRUISING OR BLEEDING *URINARY PROBLEMS (pain or burning when urinating, or frequent urination) *BOWEL PROBLEMS (unusual diarrhea, constipation, pain near the anus) TENDERNESS IN MOUTH AND THROAT WITH OR WITHOUT PRESENCE OF ULCERS (sore throat, sores in mouth, or a toothache) UNUSUAL RASH, SWELLING OR PAIN  UNUSUAL VAGINAL DISCHARGE OR ITCHING   Items with * indicate a potential emergency and should be followed up as soon as possible or go to the Emergency Department if any problems should occur.  Please show the CHEMOTHERAPY ALERT CARD or IMMUNOTHERAPY ALERT CARD at check-in to the Emergency Department and triage nurse.  Should you have questions after your visit or need to  cancel or reschedule your appointment, please contact [facility] [phone]  and follow the prompts.  Office hours are 8:00 a.m. to 4:30 p.m. Monday - Friday. Please note that voicemails left after 4:00 p.m. may not be returned until the following business day.  We are closed weekends and major holidays. You have access to a nurse at all times for urgent questions. Please call the main number to the clinic [phone] and follow the prompts.  For any non-urgent questions, you may also contact your provider using MyChart. We now offer e-Visits for anyone 18 and older to request care online for non-urgent symptoms. For details visit [email or web address].   Also download the MyChart app! Go to the app store, search "MyChart", open the app, select [facility], and log in with your MyChart username and password.  Masks are optional in the cancer centers. If you would like for your care team to wear a mask while they are taking care of you, please let them know. You may have one support person who is at least 73 years old accompany you for your appointments.  

## 2022-02-10 NOTE — Progress Notes (Signed)
Patient presents today for therapeutic phlebotomy.  Patient is in satisfactory condition with no new complaints voiced.  Vital signs are stable.  Hematocrit today is 65.1.  We will proceed with phlebotomy per provider orders.  Therapeutic phlebotomy started at 0953 and ended at 1000.  500 mL of blood removed.  Patient denies dizziness or lightheadedness.  Patient refused to wait the recommended 30 minute post wait time. Vital signs remained stable.  Patient left ambulatory in stable condition.

## 2022-02-17 ENCOUNTER — Inpatient Hospital Stay: Payer: Medicare HMO | Attending: Hematology | Admitting: Physician Assistant

## 2022-02-17 VITALS — BP 130/72 | HR 88 | Temp 97.9°F | Resp 18 | Wt 197.5 lb

## 2022-02-17 DIAGNOSIS — D696 Thrombocytopenia, unspecified: Secondary | ICD-10-CM | POA: Insufficient documentation

## 2022-02-17 DIAGNOSIS — D751 Secondary polycythemia: Secondary | ICD-10-CM | POA: Diagnosis not present

## 2022-02-17 DIAGNOSIS — Z87891 Personal history of nicotine dependence: Secondary | ICD-10-CM

## 2022-02-17 DIAGNOSIS — Z7982 Long term (current) use of aspirin: Secondary | ICD-10-CM | POA: Diagnosis not present

## 2022-02-17 DIAGNOSIS — J449 Chronic obstructive pulmonary disease, unspecified: Secondary | ICD-10-CM | POA: Diagnosis not present

## 2022-02-17 NOTE — Patient Instructions (Signed)
[facility] Discharge Instructions  You were seen today by Tarri Abernethy PA-C for your elevated red blood cells (secondary polycythemia).  Your elevated red blood cells are related to your tobacco use as well as your underlying COPD/lung disease.  We do want you to have a slightly elevated red blood cells (up to hematocrit 55.0) to compensate for your low oxygen state.  However, hematocrit higher than 55.0 places you at increased risk of heart attack, stroke, and blood clots.  Your hematocrit today was 65.0, which is extremely high!  We will schedule you for therapeutic phlebotomy once a month.  I will see you for follow-up office visit in 3 months.  Continue to take aspirin 81 mg daily.  The most important thing for you to do is to quit smoking.  It is also extremely important that you wear your supplemental oxygen at all times!  (Otherwise, your oxygen drops too low and your body responds by making more blood cells.)   - - - - - - - - - - - - - - - - -   Thank you for choosing [facility] at [facility] to provide your oncology and hematology care.  To afford each patient quality time with our provider, please arrive at least 15 minutes before your scheduled appointment time.   If you have a lab appointment with the [facility] please come in thru the Main Entrance and check in at the main information desk.  You need to re-schedule your appointment should you arrive 10 or more minutes late.  We strive to give you quality time with our providers, and arriving late affects you and other patients whose appointments are after yours.  Also, if you no show three or more times for appointments you may be dismissed from the clinic at the providers discretion.     Again, thank you for choosing [facility]  Our hope is that these requests will decrease the amount of time that you wait before being seen by our physicians.        _____________________________________________________________  Should you have questions after your visit to [facility], please contact our office at [phone] and follow the prompts.  Our office hours are 8:00 a.m. and 4:30 p.m. Monday - Friday.  Please note that voicemails left after 4:00 p.m. may not be returned until the following business day.  We are closed weekends and major holidays.  You do have access to a nurse 24-7, just call the main number to the clinic [phone] and do not press any options, hold on the line and a nurse will answer the phone.    For prescription refill requests, have your pharmacy contact our office and allow 72 hours.    Due to Covid, you will need to wear a mask upon entering the hospital. If you do not have a mask, a mask will be given to you at the Main Entrance upon arrival. For doctor visits, patients may have 1 support person age 22 or older with them. For treatment visits, patients can not have anyone with them due to social distancing guidelines and our immunocompromised population.

## 2022-02-17 NOTE — Progress Notes (Signed)
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 Bi 71 y.o. 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  He was last seen by Tarri Abernethy PA-C on 10/03/2021.  At today's visit, he reports feeling fair.   No recent hospitalizations, surgeries, or changes in baseline health status.  Since last visit, he had phlebotomy on 10/03/2021, 11/22/2021, and 02/10/2022 - reports that he felt improved energy after phlebotomy.   Regarding his secondary polycythemia from tobacco use and COPD, he reports that he is trying to cut back on smoking, is down to about 0.5 PPD (from 1.5 PPD); he has been smoking since age 45.   He is taking aspirin 81 mg daily. He denies any history of DVT, PE, MI, or CVA, but does note that he had a left heart catheterization and "had his arteries cleaned out" in 2017.  He admits to dusky red discoloration of his fingers, but denies any Raynaud's phenomenon.  He has occasional aquagenic pruritus.  No erythromelalgia.  He does have intermittent dizziness and blurry vision.  He denies any tinnitus, strokelike symptoms, or neuropathy.  No B symptoms such as fever, chills, night sweats, unintentional weight loss.  He denies any abdominal pain, nausea, or early satiety.  He has 50% energy and 100% appetite.  He endorses that he is maintaining a stable weight.   REVIEW OF SYSTEMS:    Review of Systems  Constitutional:  Positive for fatigue. Negative for appetite change, chills, diaphoresis, fever and unexpected weight change.  HENT:   Negative for lump/mass and nosebleeds.   Eyes:  Negative  for eye problems.  Respiratory:  Positive for cough and shortness of breath. Negative for hemoptysis.   Cardiovascular:  Negative for chest pain, leg swelling and palpitations.  Gastrointestinal:  Negative for abdominal pain, blood in stool, constipation, diarrhea, nausea and vomiting.  Genitourinary:  Negative for hematuria.   Musculoskeletal:  Positive for back pain.  Skin: Negative.   Neurological:  Positive for dizziness and headaches. Negative for light-headedness.  Hematological:  Does not bruise/bleed easily.  Psychiatric/Behavioral:  Positive for sleep disturbance.      PAST MEDICAL/SURGICAL HISTORY:  Past Medical History:  Diagnosis Date   Bilateral leg edema    CHF (congestive heart failure) (HCC)    COPD (chronic obstructive pulmonary disease) ([facility])    Coronary atherosclerosis    Nonobstructive at cardiac catheterization April 2017   Diabetes mellitus without complication ([place])    Essential hypertension    On home O2    2L N/C prn   Polycythemia    Past Surgical History:  Procedure Laterality Date   CARDIAC CATHETERIZATION N/A 08/10/2015   Procedure: Left Heart Cath and Coronary Angiography;  Surgeon: Troy Sine, MD;  Location: [facility];  Service: Cardiovascular;  Laterality: N/A;     SOCIAL HISTORY:  Social History   Socioeconomic History   Marital status: Divorced    Spouse name: Not on file   Number of children: Not on file   Years of education: Not on file   Highest education level: Not on file  Occupational History   Not on  file  Tobacco Use   Smoking status: Every Day    Packs/day: 1.00    Years: 50.00    Total pack years: 50.00    Types: Cigarettes    Start date: 04/17/1961   Smokeless tobacco: Never  Vaping Use   Vaping Use: Never used  Substance and Sexual Activity   Alcohol use: No    Alcohol/week: 0.0 standard drinks of alcohol    Comment: No EtOH for 7 years   Drug use: No   Sexual activity: Yes  Other Topics Concern   Not  on file  Social History Narrative   Not on file   Social Determinants of Health   Financial Resource Strain: Not on file  Food Insecurity: Not on file  Transportation Needs: Not on file  Physical Activity: Not on file  Stress: Not on file  Social Connections: Not on file  Intimate Partner Violence: Not on file    FAMILY HISTORY:  Family History  Problem Relation Age of Onset   Stroke Mother    Emphysema Father     CURRENT MEDICATIONS:  Outpatient Encounter Medications as of 02/17/2022  Medication Sig   Accu-Chek Softclix Lancets lancets    albuterol (ACCUNEB) 0.63 MG/3ML nebulizer solution Take 3 mLs (0.63 mg total) by nebulization every 4 (four) hours as needed.   albuterol (VENTOLIN HFA) 108 (90 Base) MCG/ACT inhaler Inhale 2 puffs into the lungs every 6 (six) hours as needed for wheezing or shortness of breath.   allopurinol (ZYLOPRIM) 100 MG tablet Take 200 mg by mouth 2 (two) times daily.    aspirin EC 81 MG tablet Take 81 mg by mouth daily.   atorvastatin (LIPITOR) 20 MG tablet Take 20 mg by mouth daily at 6 PM.    azelastine (ASTELIN) 0.1 % nasal spray 1 puff in each nostril   azithromycin (ZITHROMAX) 250 MG tablet Take 2 on day one then 1 daily x 4 days   Blood Glucose Monitoring Suppl (TRUE METRIX METER) DEVI See admin instructions.   carvedilol (COREG) 3.125 MG tablet Take 1 tablet (3.125 mg total) by mouth 2 (two) times daily with a meal.   DULoxetine (CYMBALTA) 30 MG capsule Take 30 mg by mouth daily.   escitalopram (LEXAPRO) 20 MG tablet Take 20 mg by mouth daily.   fluticasone (FLONASE) 50 MCG/ACT nasal spray 1 spray in each nostril   Fluticasone-Umeclidin-Vilant (TRELEGY ELLIPTA) 100-62.5-25 MCG/ACT AEPB One click each am   ipratropium-albuterol (DUONEB) 0.5-2.5 (3) MG/3ML SOLN Take by nebulization.   LINZESS 145 MCG CAPS capsule Take 145 mcg by mouth daily.   Magnesium Gluconate 500 (27 Mg) MG TABS Take 500 mg by mouth in the morning and at bedtime.     metolazone (ZAROXOLYN) 2.5 MG tablet Take 2.5 mg by mouth every other day.    MITIGARE 0.6 MG CAPS Take by mouth.   OXYGEN Inhale into the lungs. 2.5L at home use   potassium chloride (MICRO-K) 10 MEQ CR capsule TAKE 1 CAPSULE BY MOUTH ONCE DAILY WITH FOOD   prednisoLONE acetate (PRED FORTE) 1 % ophthalmic suspension SMARTSIG:In Eye(s)   sildenafil (VIAGRA) 50 MG tablet Take by mouth.   SIMBRINZA 1-0.2 % SUSP Apply to eye.   torsemide (DEMADEX) 20 MG tablet Take 80 mg by mouth 2 (two) times daily.    vitamin B-12 (CYANOCOBALAMIN) 1000 MCG tablet Take 1,000 mcg by mouth daily.   No facility-administered encounter medications on file as of 02/17/2022.    ALLERGIES:  Allergies  Allergen  Reactions   Amoxicillin Hives    Other reaction(s): rash   Other     Other reaction(s): Unknown   Penicillin G     Other reaction(s): rash   Penicillins Hives    Has patient had a PCN reaction causing immediate rash, facial/tongue/throat swelling, SOB or lightheadedness with hypotension: Unknown Has patient had a PCN reaction causing severe rash involving mucus membranes or skin necrosis: No Has patient had a PCN reaction that required hospitalization No Has patient had a PCN reaction occurring within the last 10 years: No If all of the above answers are "NO", then may proceed with Cephalosporin use.      PHYSICAL EXAM:   ECOG PERFORMANCE STATUS: 1 - Symptomatic but completely ambulatory  There were no vitals filed for this visit. There were no vitals filed for this visit. Physical Exam Constitutional:      Appearance: Normal appearance. He is obese.  HENT:     Head: Normocephalic and atraumatic.     Mouth/Throat:     Mouth: Mucous membranes are moist.  Eyes:     Extraocular Movements: Extraocular movements intact.     Pupils: Pupils are equal, round, and reactive to light.  Cardiovascular:     Rate and Rhythm: Normal rate and regular rhythm.     Pulses: Normal pulses.     Heart sounds:  Normal heart sounds.  Pulmonary:     Effort: Pulmonary effort is normal.     Breath sounds: Decreased air movement present. Decreased breath sounds present.     Comments: Quiet lungs with poor air movement in all lung fields. Abdominal:     General: Bowel sounds are normal.     Palpations: Abdomen is soft.     Tenderness: There is no abdominal tenderness.  Musculoskeletal:        General: No swelling.     Right lower leg: No edema.     Left lower leg: No edema.  Lymphadenopathy:     Cervical: No cervical adenopathy.  Skin:    General: Skin is warm and dry.     Coloration: Skin is cyanotic (peripheral cyanosis of fingertips and ears).     Nails: There is clubbing.     Comments: Marked nail clubbing and dusky erythema of fingertips.   Neurological:     General: No focal deficit present.     Mental Status: He is alert and oriented to person, place, and time.  Psychiatric:        Mood and Affect: Mood normal.        Behavior: Behavior normal.      LABORATORY DATA:  I have reviewed the labs as listed.  CBC    Component Value Date/Time   WBC 12.3 (H) 02/10/2022 0913   RBC 6.93 (H) 02/10/2022 0913   HGB 20.5 (H) 02/10/2022 0913   HCT 65.1 (H) 02/10/2022 0913   PLT 131 (L) 02/10/2022 0913   MCV 93.9 02/10/2022 0913   MCH 29.6 02/10/2022 0913   MCHC 31.5 02/10/2022 0913   RDW 17.9 (H) 02/10/2022 0913   LYMPHSABS 2.2 10/03/2021 1126   MONOABS 1.0 10/03/2021 1126   EOSABS 0.3 10/03/2021 1126   BASOSABS 0.1 10/03/2021 1126      Latest Ref Rng & Units 10/31/2021    8:36 AM 02/09/2021   10:02 AM 11/11/2020    9:46 AM  CMP  Glucose 70 - 99 mg/dL 98  151  90   BUN 8 - 27 mg/dL 71  63  35   Creatinine 0.76 - 1.27 mg/dL 2.05  2.00  1.76   Sodium 134 - 144 mmol/L 138  137  138   Potassium 3.5 - 5.2 mmol/L 3.8  3.6  4.2   Chloride 96 - 106 mmol/L 77  78  82   CO2 20 - 29 mmol/L 43  40  42   Calcium 8.6 - 10.2 mg/dL 10.8  10.2  10.1   Total Protein 6.0 - 8.5 g/dL 7.4  7.3  6.8    Total Bilirubin 0.0 - 1.2 mg/dL 1.2  0.6  0.6   Alkaline Phos 44 - 121 IU/L 91  117  106   AST 0 - 40 IU/L '23  25  17   '$ ALT 0 - 44 IU/L '17  13  13     '$ DIAGNOSTIC IMAGING:  I have independently reviewed the relevant imaging and discussed with the patient.  1.  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 - JAK/MPL/CALR mutations were all negative. - No history of DVT, PE, CVA, or MI.  He does have history of CAD s/p left heart catheterization without stents, "had his arteries cleaned out" - Has been receiving phlebotomies every 6 weeks, most recently on 02/10/2022  - Goal of phlebotomies to maintain hematocrit <54.0 to avoid severe symptoms such as strokelike symptoms, severe recurrent headache, or severe fatigue - Feels improved energy after phlebotomies - He continues to take aspirin 81 mg daily - Symptomatic with intermittent dizziness and blurry vision - Most recent CBC (02/10/2022): Hgb 20.5, HCT 65.1 - PLAN: Phlebotomy once a month- will HOLD phlebotomy if HCT < 54.0, as he likely has some appropriate physiologic polycythemia secondary to chronic hypoxic respiratory failure.     - Educated patient on the importance of compliance with supplemental oxygen and smoking cessation.   - Repeat CBC and RTC in 3 months.  2.  Tobacco abuse: - Patient has a 64-year 1 pack/day smoking history since age 89.  He continues to smoke 0.5 PPD, trying to cut back.   - Low-dose CT lung cancer screening on 01/01/2019 did not reveal any concerns for malignancy.  Emphysema was noted. - PLAN: Discussed importance of lung cancer screening program with patient, he is agreeable to proceed with annual LDCT chest.  We will schedule this within the next 1 to 2 months.  3.  Thrombocytopenia - Onset of mild thrombocytopenia in November 2022 - Most recent CBC (02/10/2022) with platelets 131 - PLAN: We will check labs prior to next appointment to include copper, folate, homocystine,  B12, MMA, ferritin, iron, immature platelet fraction, and SPEP/IFE/FLC    All questions were answered. The patient knows to call the clinic with any problems, questions or concerns.  Medical decision making: Moderate   Time spent on visit: I spent 20 minutes counseling the patient face to face. The total time spent in the appointment was 30 minutes and more than 50% was on counseling.   Harriett Rush, PA-C  02/17/22 9:44 AM

## 2022-02-24 ENCOUNTER — Ambulatory Visit: Payer: Medicare HMO | Admitting: Internal Medicine

## 2022-02-24 NOTE — Progress Notes (Deleted)
Brian Parsons, adult    DOB: 08-Dec-1950      MRN: [id number]   Brief patient profile:  59 yowm active smoker followed previously by Dr Luan Pulling with copd c/b chronic hypoxemia / polycythemia and referred to pulmonary clinic 10/14/2019 by Dr   Bartolo Darter in [place]      History of Present Illness  10/14/2019  Pulmonary/ 1st office eval/[name]  - not vaccinated  Chief Complaint  Patient presents with   Pulmonary Consult    Referred by Dr [name]. Former pt of Dr Luan Pulling. He uses o2 with sleep and as needed during the day. He states has good and bad days with his breathing. Smokes 1/2 ppd. He is using albuterol inhaler 2 x per wk and neb about 2 x per day.   Dyspnea:  No trouble up and down aisles at [employer] parking  s 02 but never checks it Cough: minimal am cough x 30 min >  1-2 tsp slt green better p abx  Sleep: 30 degrees with flat bed / pillows  SABA use: too much neb rx  02  2.5 hs  Rec Plan A = Automatic = Always=     trelegy one click each  Plan B = Backup (to supplement plan A, not to replace it) Only use your albuterol inhaler as a rescue medication  Plan C = Crisis (instead of Plan B but only if Plan B stops working) - only use your albuterol nebulizer if you first try Plan B and it fails to help  Try albuterol 15 min before an activity that you know would make you short of breath and see if it makes any difference and if makes none then don't take it after activity unless you can't catch your breath. The key is to stop smoking completely before smoking completely stops you! I strongly urge you to get the moderna or pfizer vaccine asap based on proven safety and effectiveness  We will set you up for an overnight 02 level on your oxygen  Please schedule a follow up visit in 6 months but call sooner if needed     03/11/2021  f/u ov/[facility] office/[name] re: COPD ? Gold  maint on trelegy and 02 most of the time @ 2.5 lpm but req phlebotomy  with hct 61 on 02/28/21   Chief Complaint  Patient presents with   Follow-up    Feels symptoms have improved since last ov. SOB has improved. Trelegy has helped symptoms.   Uses oxygen at home 2.5L O2 cont.     Dyspnea:  improved since last ov/ trying to lose wt  Cough: smoker's rattle clear / first hour  Sleeping: flat bed but 30 degrees with pillows  SABA use: neb once a day  02: 2.5 25/7  Covid status: never vax/ never infected  Rec Plan A = Automatic = Always=    Trelegy 073 one click each am  Plan B = Backup (to supplement plan A, not to replace it) Only use your albuterol inhaler as a rescue medication  Plan C = Crisis (instead of Plan B but only if Plan B stops working) - only use your albuterol nebulizer (8 x stronger) if you first try Plan B   The key is to stop smoking completely before smoking completely stops you! Goal with goal with 02 is keep it above 90% saturation at all times on your meter.     08/29/2021  f/u ov/[place] office/[name] re: GOLD ? Copd  maint on trelegy and requiring phlebotomy for secondary polycythemia  Chief Complaint  Patient presents with   Follow-up    Sob is a little better since last OV.  Still using 2.5 LO2 cont.  At home prn    Dyspnea:  MMRC2 = can't walk a nl pace on a flat grade s sob but does fine slow and flat / has trouble with lifting  Cough: some am congestion/rattling green mucus x sev easy  Sleeping: flat bed/ 3-4 pillows  SABA use: not much hfa/ seldom hs 02: 2lpm hs and prn  Covid status: never vax / infected  Lung cancer screening:  04/04/22  Rec For "allergies"  = zyrtec 10 mg in pm as needed  Zpak  check your oxygen saturation  AT  your highest level of activity (not after you stop)   to be sure it stays over 90%       02/24/2022  f/u ov/[facility] office/[name] re: *** maint on ***  No chief complaint on file.   Dyspnea:  *** Cough: *** Sleeping: *** SABA use: *** 02: *** Covid status: ***    No obvious day to day or daytime  variability or assoc excess/ purulent sputum or mucus plugs or hemoptysis or cp or chest tightness, subjective wheeze or overt sinus or hb symptoms.   *** without nocturnal  or early am exacerbation  of respiratory  c/o's or need for noct saba. Also denies any obvious fluctuation of symptoms with weather or environmental changes or other aggravating or alleviating factors except as outlined above   No unusual exposure hx or h/o childhood pna/ asthma or knowledge of premature birth.  Current Allergies, Complete Past Medical History, Past Surgical History, Family History, and Social History were reviewed in [employer] record.  ROS  The following are not active complaints unless bolded Hoarseness, sore throat, dysphagia, dental problems, itching, sneezing,  nasal congestion or discharge of excess mucus or purulent secretions, ear ache,   fever, chills, sweats, unintended wt loss or wt gain, classically pleuritic or exertional cp,  orthopnea pnd or arm/hand swelling  or leg swelling, presyncope, palpitations, abdominal pain, anorexia, nausea, vomiting, diarrhea  or change in bowel habits or change in bladder habits, change in stools or change in urine, dysuria, hematuria,  rash, arthralgias, visual complaints, headache, numbness, weakness or ataxia or problems with walking or coordination,  change in mood or  memory.        No outpatient medications have been marked as taking for the 02/24/22 encounter (Appointment) with Tanda Rockers, MD.          Past Medical History:  Diagnosis Date   Bilateral leg edema    CHF (congestive heart failure) ([place])    COPD (chronic obstructive pulmonary disease) ([place])    Coronary atherosclerosis    Nonobstructive at cardiac catheterization April 2017   Diabetes mellitus without complication ([place])    Essential hypertension    On home O2    2L N/C prn   Polycythemia         Objective:    Wts                  *** 08/29/2021        199  03/11/21 215 lb 0.6 oz (97.5 kg)  11/15/20 217 lb 3.2 oz (98.5 kg)  05/17/20 224 lb 12.8 oz (102 kg)     Vital signs reviewed  02/24/2022  - Note at rest 02 sats  ***%  on ***   General appearance:    ***     Mod bar***       Assessment

## 2022-02-27 NOTE — Addendum Note (Signed)
Addended by: Fritzi Mandes D on: 02/27/2022 02:46 PM   Modules accepted: Orders

## 2022-02-27 NOTE — Telephone Encounter (Signed)
Per Brian Parsons. With [employer] new order is needed in epic for patients O2 to be switched to [employer]. Order placed. Nothing further needed

## 2022-03-08 ENCOUNTER — Telehealth: Payer: Self-pay | Admitting: Internal Medicine

## 2022-03-08 NOTE — Telephone Encounter (Signed)
Attempted to call Brian Parsons at number provided but was sent to a call that had weird options. Patient does not have an order for a POC. He is on 2.5L continuous and at night. Patient has never walked for POC. So if [employer] is wanting the order for POC then patient will need to do best fit eval to qualify. Last seen by Dr Melvyn Novas in 08/2021.

## 2022-03-08 NOTE — Telephone Encounter (Signed)
[employer] called to ask the doctor to either send a new script for the patient's portable oxygen concentrator, or to send the POC pulse settings  for the order that they already have for patient.  Please advise and call Daria w/[employer] to discuss further if needed.  CB# [phone]

## 2022-03-10 NOTE — Telephone Encounter (Signed)
Attempted to call Daria with [employer] but received a recording that they had been reached after hours. Will try to call back Monday 11/27.

## 2022-03-13 NOTE — Telephone Encounter (Signed)
Called [employer] and spoke with Ariel. Let her know that patient does not have a POC as patient has continuous O2 that he wears during the day and at night. Was told by Jenetta Downer that it seemed like billing had tried to reach out to pt. When billing got on the phone, I stated to them again that the patient does not have a POC as patient has continuous oxygen only. Stated to them if there is any questions about billing that the patient is the one who needs to be contacted and they verbalized understanding. Nothing further needed.

## 2022-03-17 ENCOUNTER — Inpatient Hospital Stay: Payer: Medicare HMO

## 2022-03-19 ENCOUNTER — Inpatient Hospital Stay (HOSPITAL_COMMUNITY): Payer: Medicare HMO

## 2022-03-19 ENCOUNTER — Encounter (HOSPITAL_COMMUNITY): Payer: Self-pay

## 2022-03-19 ENCOUNTER — Other Ambulatory Visit: Payer: Self-pay

## 2022-03-19 ENCOUNTER — Emergency Department (HOSPITAL_COMMUNITY): Payer: Medicare HMO

## 2022-03-19 ENCOUNTER — Inpatient Hospital Stay (HOSPITAL_COMMUNITY)
Admission: EM | Admit: 2022-03-19 | Discharge: 2022-03-25 | DRG: 193 | Disposition: A | Discharge status: Home Health | Payer: Medicare HMO | Attending: Family Medicine | Admitting: Family Medicine

## 2022-03-19 DIAGNOSIS — R579 Shock, unspecified: Secondary | ICD-10-CM | POA: Diagnosis present

## 2022-03-19 DIAGNOSIS — I2729 Other secondary pulmonary hypertension: Secondary | ICD-10-CM | POA: Diagnosis present

## 2022-03-19 DIAGNOSIS — Z950 Presence of cardiac pacemaker: Secondary | ICD-10-CM

## 2022-03-19 DIAGNOSIS — E669 Obesity, unspecified: Secondary | ICD-10-CM | POA: Diagnosis present

## 2022-03-19 DIAGNOSIS — Z825 Family history of asthma and other chronic lower respiratory diseases: Secondary | ICD-10-CM

## 2022-03-19 DIAGNOSIS — I4940 Unspecified premature depolarization: Secondary | ICD-10-CM | POA: Diagnosis present

## 2022-03-19 DIAGNOSIS — J101 Influenza due to other identified influenza virus with other respiratory manifestations: Secondary | ICD-10-CM | POA: Diagnosis present

## 2022-03-19 DIAGNOSIS — Z7982 Long term (current) use of aspirin: Secondary | ICD-10-CM

## 2022-03-19 DIAGNOSIS — J9621 Acute and chronic respiratory failure with hypoxia: Secondary | ICD-10-CM | POA: Diagnosis present

## 2022-03-19 DIAGNOSIS — K869 Disease of pancreas, unspecified: Secondary | ICD-10-CM | POA: Diagnosis present

## 2022-03-19 DIAGNOSIS — E1122 Type 2 diabetes mellitus with diabetic chronic kidney disease: Secondary | ICD-10-CM | POA: Diagnosis present

## 2022-03-19 DIAGNOSIS — Z880 Allergy status to penicillin: Secondary | ICD-10-CM

## 2022-03-19 DIAGNOSIS — Z79899 Other long term (current) drug therapy: Secondary | ICD-10-CM

## 2022-03-19 DIAGNOSIS — K589 Irritable bowel syndrome without diarrhea: Secondary | ICD-10-CM | POA: Diagnosis present

## 2022-03-19 DIAGNOSIS — D696 Thrombocytopenia, unspecified: Secondary | ICD-10-CM | POA: Diagnosis not present

## 2022-03-19 DIAGNOSIS — I130 Hypertensive heart and chronic kidney disease with heart failure and stage 1 through stage 4 chronic kidney disease, or unspecified chronic kidney disease: Secondary | ICD-10-CM | POA: Diagnosis present

## 2022-03-19 DIAGNOSIS — I5082 Biventricular heart failure: Secondary | ICD-10-CM | POA: Diagnosis present

## 2022-03-19 DIAGNOSIS — J9622 Acute and chronic respiratory failure with hypercapnia: Secondary | ICD-10-CM | POA: Diagnosis present

## 2022-03-19 DIAGNOSIS — J9811 Atelectasis: Secondary | ICD-10-CM | POA: Diagnosis present

## 2022-03-19 DIAGNOSIS — N1832 Chronic kidney disease, stage 3b: Secondary | ICD-10-CM | POA: Diagnosis present

## 2022-03-19 DIAGNOSIS — I5043 Acute on chronic combined systolic (congestive) and diastolic (congestive) heart failure: Secondary | ICD-10-CM | POA: Diagnosis present

## 2022-03-19 DIAGNOSIS — J1100 Influenza due to unidentified influenza virus with unspecified type of pneumonia: Secondary | ICD-10-CM | POA: Diagnosis not present

## 2022-03-19 DIAGNOSIS — Z20822 Contact with and (suspected) exposure to covid-19: Secondary | ICD-10-CM | POA: Diagnosis present

## 2022-03-19 DIAGNOSIS — R7989 Other specified abnormal findings of blood chemistry: Secondary | ICD-10-CM

## 2022-03-19 DIAGNOSIS — I2781 Cor pulmonale (chronic): Secondary | ICD-10-CM | POA: Diagnosis present

## 2022-03-19 DIAGNOSIS — I498 Other specified cardiac arrhythmias: Secondary | ICD-10-CM

## 2022-03-19 DIAGNOSIS — T17208A Unspecified foreign body in pharynx causing other injury, initial encounter: Secondary | ICD-10-CM | POA: Diagnosis present

## 2022-03-19 DIAGNOSIS — R600 Localized edema: Secondary | ICD-10-CM

## 2022-03-19 DIAGNOSIS — I4891 Unspecified atrial fibrillation: Secondary | ICD-10-CM | POA: Diagnosis not present

## 2022-03-19 DIAGNOSIS — Z823 Family history of stroke: Secondary | ICD-10-CM

## 2022-03-19 DIAGNOSIS — S098XXA Other specified injuries of head, initial encounter: Secondary | ICD-10-CM | POA: Diagnosis present

## 2022-03-19 DIAGNOSIS — J9602 Acute respiratory failure with hypercapnia: Secondary | ICD-10-CM

## 2022-03-19 DIAGNOSIS — J9601 Acute respiratory failure with hypoxia: Secondary | ICD-10-CM | POA: Diagnosis not present

## 2022-03-19 DIAGNOSIS — Z6828 Body mass index (BMI) 28.0-28.9, adult: Secondary | ICD-10-CM

## 2022-03-19 DIAGNOSIS — I471 Supraventricular tachycardia, unspecified: Secondary | ICD-10-CM | POA: Diagnosis present

## 2022-03-19 DIAGNOSIS — J441 Chronic obstructive pulmonary disease with (acute) exacerbation: Secondary | ICD-10-CM | POA: Diagnosis present

## 2022-03-19 DIAGNOSIS — E8729 Other acidosis: Secondary | ICD-10-CM | POA: Diagnosis present

## 2022-03-19 DIAGNOSIS — F17210 Nicotine dependence, cigarettes, uncomplicated: Secondary | ICD-10-CM | POA: Diagnosis present

## 2022-03-19 DIAGNOSIS — E440 Moderate protein-calorie malnutrition: Secondary | ICD-10-CM | POA: Insufficient documentation

## 2022-03-19 DIAGNOSIS — W449XXA Unspecified foreign body entering into or through a natural orifice, initial encounter: Secondary | ICD-10-CM | POA: Diagnosis present

## 2022-03-19 DIAGNOSIS — Z881 Allergy status to other antibiotic agents status: Secondary | ICD-10-CM

## 2022-03-19 DIAGNOSIS — R9431 Abnormal electrocardiogram [ECG] [EKG]: Secondary | ICD-10-CM

## 2022-03-19 DIAGNOSIS — Z9981 Dependence on supplemental oxygen: Secondary | ICD-10-CM

## 2022-03-19 DIAGNOSIS — R609 Edema, unspecified: Secondary | ICD-10-CM

## 2022-03-19 DIAGNOSIS — I872 Venous insufficiency (chronic) (peripheral): Secondary | ICD-10-CM | POA: Diagnosis present

## 2022-03-19 DIAGNOSIS — Z7951 Long term (current) use of inhaled steroids: Secondary | ICD-10-CM

## 2022-03-19 DIAGNOSIS — I4719 Other supraventricular tachycardia: Secondary | ICD-10-CM | POA: Diagnosis present

## 2022-03-19 DIAGNOSIS — M109 Gout, unspecified: Secondary | ICD-10-CM | POA: Diagnosis present

## 2022-03-19 DIAGNOSIS — I2510 Atherosclerotic heart disease of native coronary artery without angina pectoris: Secondary | ICD-10-CM | POA: Diagnosis present

## 2022-03-19 DIAGNOSIS — F32A Depression, unspecified: Secondary | ICD-10-CM | POA: Diagnosis present

## 2022-03-19 DIAGNOSIS — N179 Acute kidney failure, unspecified: Secondary | ICD-10-CM | POA: Diagnosis present

## 2022-03-19 DIAGNOSIS — I493 Ventricular premature depolarization: Secondary | ICD-10-CM | POA: Diagnosis present

## 2022-03-19 DIAGNOSIS — I50812 Chronic right heart failure: Secondary | ICD-10-CM

## 2022-03-19 DIAGNOSIS — D751 Secondary polycythemia: Secondary | ICD-10-CM | POA: Diagnosis present

## 2022-03-19 DIAGNOSIS — R7889 Finding of other specified substances, not normally found in blood: Secondary | ICD-10-CM | POA: Diagnosis not present

## 2022-03-19 LAB — CBC WITH DIFFERENTIAL/PLATELET
Abs Immature Granulocytes: 0.18 10*3/uL — ABNORMAL HIGH (ref 0.00–0.07)
Basophils Absolute: 0 10*3/uL (ref 0.0–0.1)
Basophils Relative: 0 %
Eosinophils Absolute: 0 10*3/uL (ref 0.0–0.5)
Eosinophils Relative: 0 %
HCT: 61.6 % — ABNORMAL HIGH (ref 39.0–52.0)
Hemoglobin: 19.2 g/dL — ABNORMAL HIGH (ref 13.0–17.0)
Immature Granulocytes: 1 %
Lymphocytes Relative: 7 %
Lymphs Abs: 1.1 10*3/uL (ref 0.7–4.0)
MCH: 29.3 pg (ref 26.0–34.0)
MCHC: 31.2 g/dL (ref 30.0–36.0)
MCV: 93.9 fL (ref 80.0–100.0)
Monocytes Absolute: 1.6 10*3/uL — ABNORMAL HIGH (ref 0.1–1.0)
Monocytes Relative: 11 %
Neutro Abs: 12.1 10*3/uL — ABNORMAL HIGH (ref 1.7–7.7)
Neutrophils Relative %: 81 %
Platelets: 152 10*3/uL (ref 150–400)
RBC: 6.56 MIL/uL — ABNORMAL HIGH (ref 4.22–5.81)
RDW: 17.7 % — ABNORMAL HIGH (ref 11.5–15.5)
WBC: 14.9 10*3/uL — ABNORMAL HIGH (ref 4.0–10.5)
nRBC: 0 % (ref 0.0–0.2)

## 2022-03-19 LAB — BLOOD GAS, VENOUS
Acid-Base Excess: 21.4 mmol/L — ABNORMAL HIGH (ref 0.0–2.0)
Bicarbonate: 55.3 mmol/L — ABNORMAL HIGH (ref 20.0–28.0)
Drawn by: 27160
O2 Saturation: 35.5 %
Patient temperature: 36.8
pCO2, Ven: >123 mmHg (ref 44–60)
pH, Ven: 7.25 (ref 7.25–7.43)
pO2, Ven: 31 mmHg — CL (ref 32–45)

## 2022-03-19 LAB — COMPREHENSIVE METABOLIC PANEL
ALT: 141 U/L — ABNORMAL HIGH (ref 0–44)
AST: 124 U/L — ABNORMAL HIGH (ref 15–41)
Albumin: 3.7 g/dL (ref 3.5–5.0)
Alkaline Phosphatase: 83 U/L (ref 38–126)
Anion gap: 16 — ABNORMAL HIGH (ref 5–15)
BUN: 96 mg/dL — ABNORMAL HIGH (ref 8–23)
CO2: 40 mmol/L — ABNORMAL HIGH (ref 22–32)
Calcium: 8.9 mg/dL (ref 8.9–10.3)
Chloride: 77 mmol/L — ABNORMAL LOW (ref 98–111)
Creatinine, Ser: 3.81 mg/dL — ABNORMAL HIGH (ref 0.61–1.24)
GFR, Estimated: 16 mL/min — ABNORMAL LOW (ref >60)
Glucose, Bld: 122 mg/dL — ABNORMAL HIGH (ref 70–99)
Potassium: 5.1 mmol/L (ref 3.5–5.1)
Sodium: 133 mmol/L — ABNORMAL LOW (ref 135–145)
Total Bilirubin: 1.1 mg/dL (ref 0.3–1.2)
Total Protein: 7.2 g/dL (ref 6.5–8.1)

## 2022-03-19 LAB — APTT: aPTT: 28 seconds (ref 24–36)

## 2022-03-19 LAB — PROCALCITONIN: Procalcitonin: 0.24 ng/mL

## 2022-03-19 LAB — BRAIN NATRIURETIC PEPTIDE: B Natriuretic Peptide: 1196 pg/mL — ABNORMAL HIGH (ref 0.0–100.0)

## 2022-03-19 LAB — GLUCOSE, CAPILLARY: Glucose-Capillary: 123 mg/dL — ABNORMAL HIGH (ref 70–99)

## 2022-03-19 LAB — TROPONIN I (HIGH SENSITIVITY)
Troponin I (High Sensitivity): 397 ng/L (ref <18)
Troponin I (High Sensitivity): 358 ng/L (ref <18)

## 2022-03-19 LAB — MRSA NEXT GEN BY PCR, NASAL: MRSA by PCR Next Gen: NOT DETECTED

## 2022-03-19 LAB — CK: Total CK: 117 U/L (ref 49–397)

## 2022-03-19 LAB — PROTIME-INR
INR: 1.1 (ref 0.8–1.2)
Prothrombin Time: 14.6 seconds (ref 11.4–15.2)

## 2022-03-19 LAB — LACTIC ACID, PLASMA
Lactic Acid, Venous: 1.5 mmol/L (ref 0.5–1.9)
Lactic Acid, Venous: 3.1 mmol/L (ref 0.5–1.9)

## 2022-03-19 LAB — SARS CORONAVIRUS 2 BY RT PCR: SARS Coronavirus 2 by RT PCR: NEGATIVE

## 2022-03-19 MED ORDER — SODIUM CHLORIDE 0.9 % IV SOLN
500.0000 mg | INTRAVENOUS | Status: AC
  Administered 2022-03-19 – 2022-03-21 (×3): 500 mg via INTRAVENOUS
  Filled 2022-03-19 (×4): qty 5

## 2022-03-19 MED ORDER — POLYETHYLENE GLYCOL 3350 17 G PO PACK: 17.0000 g | PACK | Freq: Every day | ORAL | Status: DC | PRN

## 2022-03-19 MED ORDER — ASPIRIN 81 MG PO CHEW: 81.0000 mg | CHEWABLE_TABLET | Freq: Every day | ORAL | Status: DC

## 2022-03-19 MED ORDER — ASPIRIN 81 MG PO CHEW
81.0000 mg | CHEWABLE_TABLET | Freq: Every day | ORAL | Status: DC
  Administered 2022-03-20: 81 mg
  Filled 2022-03-19: qty 1

## 2022-03-19 MED ORDER — BUDESONIDE 0.5 MG/2ML IN SUSP
0.5000 mg | Freq: Two times a day (BID) | RESPIRATORY_TRACT | Status: DC
  Administered 2022-03-19 – 2022-03-25 (×12): 0.5 mg via RESPIRATORY_TRACT
  Filled 2022-03-19 (×12): qty 2

## 2022-03-19 MED ORDER — CHLORHEXIDINE GLUCONATE CLOTH 2 % EX PADS
6.0000 | MEDICATED_PAD | Freq: Every day | CUTANEOUS | Status: DC
  Administered 2022-03-22 – 2022-03-24 (×3): 6 via TOPICAL

## 2022-03-19 MED ORDER — POLYETHYLENE GLYCOL 3350 17 G PO PACK
17.0000 g | PACK | Freq: Every day | ORAL | Status: DC
  Administered 2022-03-20: 17 g
  Filled 2022-03-19: qty 1

## 2022-03-19 MED ORDER — FAMOTIDINE 20 MG PO TABS
20.0000 mg | ORAL_TABLET | Freq: Every day | ORAL | Status: DC
  Administered 2022-03-19: 20 mg
  Filled 2022-03-19: qty 1

## 2022-03-19 MED ORDER — HEPARIN BOLUS VIA INFUSION: 4000.0000 [IU] | Freq: Once | INTRAVENOUS | Status: DC

## 2022-03-19 MED ORDER — ETOMIDATE 2 MG/ML IV SOLN
INTRAVENOUS | Status: AC
  Filled 2022-03-19: qty 20

## 2022-03-19 MED ORDER — NOREPINEPHRINE 4 MG/250ML-% IV SOLN
2.0000 ug/min | INTRAVENOUS | Status: DC
  Administered 2022-03-19: 6 ug/min via INTRAVENOUS
  Administered 2022-03-20: 7 ug/min via INTRAVENOUS
  Filled 2022-03-19 (×2): qty 250

## 2022-03-19 MED ORDER — SODIUM BICARBONATE 8.4 % IV SOLN
50.0000 meq | Freq: Once | INTRAVENOUS | Status: AC
  Administered 2022-03-19: 50 meq via INTRAVENOUS
  Filled 2022-03-19: qty 50

## 2022-03-19 MED ORDER — NOREPINEPHRINE 4 MG/250ML-% IV SOLN
INTRAVENOUS | Status: AC
  Administered 2022-03-19: 2 ug/min via INTRAVENOUS
  Filled 2022-03-19: qty 250

## 2022-03-19 MED ORDER — FENTANYL CITRATE PF 50 MCG/ML IJ SOSY
25.0000 ug | PREFILLED_SYRINGE | INTRAMUSCULAR | Status: DC | PRN
  Administered 2022-03-19: 50 ug via INTRAVENOUS
  Filled 2022-03-19: qty 1
  Filled 2022-03-19: qty 2

## 2022-03-19 MED ORDER — FENTANYL CITRATE (PF) 100 MCG/2ML IJ SOLN
100.0000 ug | Freq: Once | INTRAMUSCULAR | Status: AC | PRN
  Administered 2022-03-19: 100 ug via INTRAVENOUS
  Filled 2022-03-19: qty 2

## 2022-03-19 MED ORDER — IPRATROPIUM BROMIDE 0.02 % IN SOLN: 0.5000 mg | Freq: Once | RESPIRATORY_TRACT | Status: DC

## 2022-03-19 MED ORDER — LORAZEPAM 2 MG/ML IJ SOLN: 2.0000 mg | Freq: Once | INTRAMUSCULAR | Status: DC | PRN

## 2022-03-19 MED ORDER — PHENYLEPHRINE 80 MCG/ML (10ML) SYRINGE FOR IV PUSH (FOR BLOOD PRESSURE SUPPORT)
PREFILLED_SYRINGE | INTRAVENOUS | Status: AC
  Administered 2022-03-19: 80 ug
  Filled 2022-03-19: qty 10

## 2022-03-19 MED ORDER — SODIUM CHLORIDE 0.9 % IV BOLUS (SEPSIS): 1000.0000 mL | Freq: Once | INTRAVENOUS | Status: DC

## 2022-03-19 MED ORDER — IPRATROPIUM-ALBUTEROL 0.5-2.5 (3) MG/3ML IN SOLN
RESPIRATORY_TRACT | Status: AC
  Administered 2022-03-19: 3 mL
  Filled 2022-03-19: qty 3

## 2022-03-19 MED ORDER — LACTATED RINGERS IV SOLN
INTRAVENOUS | Status: DC
  Administered 2022-03-19: 1000 mL via INTRAVENOUS

## 2022-03-19 MED ORDER — ETOMIDATE 2 MG/ML IV SOLN
INTRAVENOUS | Status: AC
  Administered 2022-03-19: 30 mg
  Filled 2022-03-19: qty 10

## 2022-03-19 MED ORDER — LEVOFLOXACIN IN D5W 750 MG/150ML IV SOLN
750.0000 mg | Freq: Once | INTRAVENOUS | Status: AC
  Administered 2022-03-19: 750 mg via INTRAVENOUS
  Filled 2022-03-19: qty 150

## 2022-03-19 MED ORDER — FAMOTIDINE 20 MG PO TABS: 20.0000 mg | ORAL_TABLET | Freq: Two times a day (BID) | ORAL | Status: DC

## 2022-03-19 MED ORDER — ASPIRIN 81 MG PO TBEC: 81.0000 mg | DELAYED_RELEASE_TABLET | Freq: Every day | ORAL | Status: DC

## 2022-03-19 MED ORDER — SUCCINYLCHOLINE CHLORIDE 200 MG/10ML IV SOSY
PREFILLED_SYRINGE | INTRAVENOUS | Status: AC
  Filled 2022-03-19: qty 10

## 2022-03-19 MED ORDER — LEVOFLOXACIN IN D5W 750 MG/150ML IV SOLN: 750.0000 mg | INTRAVENOUS | Status: DC

## 2022-03-19 MED ORDER — IPRATROPIUM-ALBUTEROL 0.5-2.5 (3) MG/3ML IN SOLN
3.0000 mL | Freq: Four times a day (QID) | RESPIRATORY_TRACT | Status: DC
  Administered 2022-03-19 – 2022-03-20 (×3): 3 mL via RESPIRATORY_TRACT
  Filled 2022-03-19 (×3): qty 3

## 2022-03-19 MED ORDER — METHYLPREDNISOLONE SODIUM SUCC 40 MG IJ SOLR
40.0000 mg | Freq: Two times a day (BID) | INTRAMUSCULAR | Status: DC
  Administered 2022-03-20 – 2022-03-21 (×3): 40 mg via INTRAVENOUS
  Filled 2022-03-19 (×3): qty 1

## 2022-03-19 MED ORDER — ATORVASTATIN CALCIUM 10 MG PO TABS
20.0000 mg | ORAL_TABLET | Freq: Every day | ORAL | Status: DC
  Administered 2022-03-19 – 2022-03-20 (×2): 20 mg
  Filled 2022-03-19 (×2): qty 2

## 2022-03-19 MED ORDER — ARFORMOTEROL TARTRATE 15 MCG/2ML IN NEBU
15.0000 ug | INHALATION_SOLUTION | Freq: Two times a day (BID) | RESPIRATORY_TRACT | Status: DC
  Administered 2022-03-19 – 2022-03-25 (×12): 15 ug via RESPIRATORY_TRACT
  Filled 2022-03-19 (×12): qty 2

## 2022-03-19 MED ORDER — NOREPINEPHRINE 4 MG/250ML-% IV SOLN: 0.0000 ug/min | INTRAVENOUS | Status: DC

## 2022-03-19 MED ORDER — DOCUSATE SODIUM 50 MG/5ML PO LIQD
100.0000 mg | Freq: Two times a day (BID) | ORAL | Status: DC
  Administered 2022-03-19 – 2022-03-20 (×2): 100 mg
  Filled 2022-03-19 (×2): qty 10

## 2022-03-19 MED ORDER — ALBUTEROL SULFATE (2.5 MG/3ML) 0.083% IN NEBU: 2.5000 mg | INHALATION_SOLUTION | RESPIRATORY_TRACT | Status: DC | PRN

## 2022-03-19 MED ORDER — HEPARIN (PORCINE) 25000 UT/250ML-% IV SOLN: 1250.0000 [IU]/h | INTRAVENOUS | Status: DC

## 2022-03-19 MED ORDER — ALBUTEROL (5 MG/ML) CONTINUOUS INHALATION SOLN: 10.0000 mg/h | INHALATION_SOLUTION | Freq: Once | RESPIRATORY_TRACT | Status: DC

## 2022-03-19 MED ORDER — DOCUSATE SODIUM 50 MG/5ML PO LIQD: 100.0000 mg | Freq: Two times a day (BID) | ORAL | Status: DC | PRN

## 2022-03-19 MED ORDER — DOCUSATE SODIUM 100 MG PO CAPS: 100.0000 mg | ORAL_CAPSULE | Freq: Two times a day (BID) | ORAL | Status: DC | PRN

## 2022-03-19 MED ORDER — SODIUM CHLORIDE 0.9 % IV SOLN: 250.0000 mL | INTRAVENOUS | Status: DC

## 2022-03-19 MED ORDER — REVEFENACIN 175 MCG/3ML IN SOLN
175.0000 ug | Freq: Every day | RESPIRATORY_TRACT | Status: DC
  Administered 2022-03-19 – 2022-03-25 (×7): 175 ug via RESPIRATORY_TRACT
  Filled 2022-03-19 (×8): qty 3

## 2022-03-19 MED ORDER — ALBUTEROL SULFATE HFA 108 (90 BASE) MCG/ACT IN AERS: 2.0000 | INHALATION_SPRAY | RESPIRATORY_TRACT | Status: DC | PRN

## 2022-03-19 MED ORDER — METHYLPREDNISOLONE SODIUM SUCC 125 MG IJ SOLR
125.0000 mg | Freq: Once | INTRAMUSCULAR | Status: AC
  Administered 2022-03-19: 125 mg via INTRAVENOUS
  Filled 2022-03-19: qty 2

## 2022-03-19 MED ORDER — FENTANYL CITRATE PF 50 MCG/ML IJ SOSY: 25.0000 ug | PREFILLED_SYRINGE | INTRAMUSCULAR | Status: DC | PRN

## 2022-03-19 MED ORDER — HEPARIN SODIUM (PORCINE) 5000 UNIT/ML IJ SOLN
5000.0000 [IU] | Freq: Three times a day (TID) | INTRAMUSCULAR | Status: DC
  Administered 2022-03-19 – 2022-03-20 (×2): 5000 [IU] via SUBCUTANEOUS
  Filled 2022-03-19 (×2): qty 1

## 2022-03-19 MED ORDER — PROPOFOL 1000 MG/100ML IV EMUL
0.0000 ug/kg/min | INTRAVENOUS | Status: DC
  Administered 2022-03-19: 25 ug/kg/min via INTRAVENOUS
  Administered 2022-03-19: 5 ug/kg/min via INTRAVENOUS
  Administered 2022-03-20 (×2): 25 ug/kg/min via INTRAVENOUS
  Filled 2022-03-19 (×4): qty 100

## 2022-03-19 NOTE — ED Notes (Signed)
Patient's O2 sat dropping into 80s on bipap. Resp and Dr Kathrynn Humble paged to room immediately. Extremities cyanotic, poor perfusion noted. Patient to be intubated.

## 2022-03-19 NOTE — Progress Notes (Signed)
RT and RN transported vent patient to 4M ICU. Vital signs stable through out.

## 2022-03-19 NOTE — Sepsis Progress Note (Signed)
[facility] is following code sepsis 

## 2022-03-19 NOTE — ED Notes (Signed)
[facility] called to page critical care.

## 2022-03-19 NOTE — Progress Notes (Signed)
ANTICOAGULATION CONSULT NOTE - Initial Consult  Pharmacy Consult for Heparin Indication: ACS/elevated troponins  Patient Measurements: Height: 6' (182.9 cm) Weight: 96.3 kg (212 lb 4.8 oz) IBW/kg (Calculated) : 77.6 Heparin Dosing Weight:96.3  Labs: Recent Labs    03/19/22 1310  HGB 19.2*  HCT 61.6*  PLT 152  APTT 28  LABPROT 14.6  INR 1.1  CREATININE 3.81*  CKTOTAL 117  TROPONINIHS 397*   Estimated Creatinine Clearance: 20.8 mL/min (A) (by C-G formula based on SCr of 3.81 mg/dL (H)).  Assessment: 71 year old male admitted with AMS/hypoxia currently intubated - pharmacy consulted to initate systemic heparin for ACS given elevated troponin. No prior anticoagulation noted in medication list.  Goal of Therapy:  Heparin level 0.3-0.7 units/ml Monitor platelets by anticoagulation protocol: Yes   Plan:  Heparin Bolus = 4000 units Heparin gtt = 1250 units/he Heparin level in 8 hours Daily HL and CBC  Brian Parsons 03/19/2022,3:06 PM

## 2022-03-19 NOTE — Progress Notes (Signed)
Pharmacy Antibiotic Note  Brian Parsons is a 71 y.o. male admitted on 03/19/2022 with CAP. Pharmacy has been consulted for Levofloxacin dosing. Patient with apparent acute kidney insult.   Plan: Levofloxacin '750mg'$  IV q 48h  Height: 6' (182.9 cm) Weight: 96.3 kg (212 lb 4.8 oz) IBW/kg (Calculated) : 77.6  Temp (24hrs), Avg:98.3 F (36.8 C), Min:98.3 F (36.8 C), Max:98.3 F (36.8 C)  Recent Labs  Lab 03/19/22 1310  WBC 14.9*  CREATININE 3.81*  LATICACIDVEN 1.5    Estimated Creatinine Clearance: 20.8 mL/min (A) (by C-G formula based on SCr of 3.81 mg/dL (H)).    Allergies  Allergen Reactions   Amoxicillin Hives    Other reaction(s): rash   Other     Other reaction(s): Unknown   Penicillin G     Other reaction(s): rash   Penicillins Hives    Has patient had a PCN reaction causing immediate rash, facial/tongue/throat swelling, SOB or lightheadedness with hypotension: Unknown Has patient had a PCN reaction causing severe rash involving mucus membranes or skin necrosis: No Has patient had a PCN reaction that required hospitalization No Has patient had a PCN reaction occurring within the last 10 years: No If all of the above answers are "NO", then may proceed with Cephalosporin use.     Microbiology results: 12/3 BCx: P  Thank you for allowing pharmacy to be a part of this patient's care.  Lorenso Courier [facility] 03/19/2022 2:25 PM

## 2022-03-19 NOTE — Progress Notes (Addendum)
eLink Physician-Brief Progress Note Patient Name: Brian Parsons DOB: 1950-10-30 MRN: [id number]   Date of Service  03/19/2022  HPI/Events of Note  71 year old smoker (70 pack years) with a history of severe COPD, hypertension with chronic HFpEF, associated chronic hypoxemic respiratory failure on 2-2.5 L/min and resultant polycythemia  Admitted to ICU for  Acute on chronic type 2 failure. COPD exacerbation. URI at home. CHF. CAD, HFpEF.  Elevated troponin, x 2 stable. mostly type 2 Polycythemia from severe COPD. R/o PAH. Shock. No obvious sepsis, URI. Mostly from sedations. Procal normal, elevated LA from above. DM goals < 180 6. CKD, K 5.1, follow UOP  Camera: Discussed with RN. Stable vitals. On low dose levophed at 6 mcg/min,  Tall guy, on lung protective ventilation. On VAP bundle.   eICU Interventions  -continue current care plan - ECHO,as per RN, going for CxR per Dr Kennon Rounds, if abnormal for a scan scan.  Diuresis.  - trend LA, troponin - on nebs, azithromycin, pepcid and sq heparin.       Intervention Category Major Interventions: Respiratory failure - evaluation and management Evaluation Type: New Patient Evaluation  Elmer Sow 03/19/2022, 9:21 PM  2:07 LA normalizing. Continue care

## 2022-03-19 NOTE — ED Triage Notes (Signed)
Pt at home in recliner slumped over with 6 lpm nasal cannula. Pt noted to have finger, feet , and ears to be cyanotic. Nonrebreather applied by EMS . 22 g in right hand. Pt oxygen at 92%. Edp assessed pt at bedside. Nurse adjusted oxygen with nasal cannula at 4lpm . Pt is 98% but lethargic.

## 2022-03-19 NOTE — ED Notes (Signed)
Date and time results received: 03/19/22 1413 (use smartphrase ".now" to insert current time)  Test: troponin Critical Value: 397  Name of Provider Notified: Dr Kathrynn Humble  Orders Received? Or Actions Taken?: Orders Received - See Orders for details

## 2022-03-19 NOTE — ED Notes (Signed)
After looking for approx 15 minutes with glidescope, unable to visualize bottle cap again in order to remove.

## 2022-03-19 NOTE — ED Provider Notes (Signed)
[facility] Provider Note   CSN: [id number] Arrival date & time: 03/19/22  1238     History {Add pertinent medical, surgical, social history, OB history to HPI:1} Chief Complaint  Patient presents with   Respiratory Distress    Brian Parsons is a 71 y.o. male.  HPI    71 year old male comes in with chief complaint of respiratory distress. Patient has history of COPD, diabetes, CHF, tobacco use disorder.  Spoke with patient's girlfriend, who called EMS.  Upon calling her, she states that patient started getting sick on Wednesday.  They saw the PCP and was started on antibiotics and steroids.  Despite taking those medications, patient has had worsening of mental status.  He started hallucinating and today became unresponsive, confused, prompting her to bring him to the ER.  She states that patient has been taking his medications as prescribed.  She does not think patient has worsening swelling in the legs.  We had noted in the ER, the patient has dusky ears, hands and leg.  She informs me that normally the skin is dusky in that area, but it is more pronounced recently.  Home Medications Prior to Admission medications   Medication Sig Start Date End Date Taking? Authorizing Provider  Accu-Chek Softclix Lancets lancets  03/26/21   [provider]  albuterol (ACCUNEB) 0.63 MG/3ML nebulizer solution Take 3 mLs (0.63 mg total) by nebulization every 4 (four) hours as needed. 10/14/19   Tanda Rockers, MD  albuterol (VENTOLIN HFA) 108 (90 Base) MCG/ACT inhaler Inhale 2 puffs into the lungs every 6 (six) hours as needed for wheezing or shortness of breath.    [provider]  allopurinol (ZYLOPRIM) 100 MG tablet Take 200 mg by mouth 2 (two) times daily.  08/06/17   [provider]  aspirin EC 81 MG tablet Take 81 mg by mouth daily.    [provider]  atorvastatin (LIPITOR) 20 MG tablet Take 20 mg by mouth daily at 6 PM.  03/01/17  05/17/20  [provider]  azelastine (ASTELIN) 0.1 % nasal spray 1 puff in each nostril 07/19/20   [provider]  Blood Glucose Monitoring Suppl (TRUE METRIX METER) DEVI See admin instructions. 07/07/19   [provider]  carvedilol (COREG) 3.125 MG tablet Take 1 tablet (3.125 mg total) by mouth 2 (two) times daily with a meal. 10/03/17 10/14/19  Johnson, Clanford L, MD  DULoxetine (CYMBALTA) 30 MG capsule Take 30 mg by mouth daily. 10/31/21   [provider]  escitalopram (LEXAPRO) 20 MG tablet Take 20 mg by mouth daily.    [provider]  fluticasone Asencion Islam) 50 MCG/ACT nasal spray 1 spray in each nostril 07/19/20   [provider]  Fluticasone-Umeclidin-Vilant (TRELEGY ELLIPTA) 100-62.5-25 MCG/ACT AEPB One click each am 93/23/55   Tanda Rockers, MD  ipratropium-albuterol (DUONEB) 0.5-2.5 (3) MG/3ML SOLN Take by nebulization. 04/18/21   [provider]  LINZESS 145 MCG CAPS capsule Take 145 mcg by mouth daily. 09/08/21   [provider]  Magnesium Gluconate 500 (27 Mg) MG TABS Take 500 mg by mouth in the morning and at bedtime.     [provider]  metolazone (ZAROXOLYN) 2.5 MG tablet Take 2.5 mg by mouth every other day.  10/01/18   [provider]  OXYGEN Inhale into the lungs. 2.5L at home use    [provider]  potassium chloride (MICRO-K) 10 MEQ CR capsule TAKE 1 CAPSULE BY MOUTH ONCE DAILY WITH  FOOD    [provider]  sildenafil (VIAGRA) 50 MG tablet Take by mouth. 04/18/21   [provider]  torsemide (DEMADEX) 20 MG tablet Take 80 mg by mouth 2 (two) times daily.     [provider]  vitamin B-12 (CYANOCOBALAMIN) 1000 MCG tablet Take 1,000 mcg by mouth daily.    [provider]      Allergies    Amoxicillin, Other, Penicillin g, and Penicillins    Review of Systems   Review of Systems  Physical Exam Updated Vital Signs BP 96/70   Pulse 65   Temp 98.3  F (36.8 C) (Oral)   Resp 17   Ht 6' (1.829 m)   Wt 96.3 kg   SpO2 98%   BMI 28.79 kg/m  Physical Exam Constitutional:      Appearance: He is well-developed.     Comments: Disoriented, somnolent  HENT:     Head: Atraumatic.  Eyes:     General: Scleral icterus present.     Extraocular Movements: Extraocular movements intact.     Conjunctiva/sclera: Conjunctivae normal.  Cardiovascular:     Rate and Rhythm: Normal rate and regular rhythm.     Heart sounds: Normal heart sounds.  Pulmonary:     Effort: Pulmonary effort is normal. No respiratory distress.     Breath sounds: Wheezing and rhonchi present.  Abdominal:     General: Bowel sounds are normal. There is no distension.     Palpations: Abdomen is soft.     Tenderness: There is no abdominal tenderness. There is no guarding or rebound.  Musculoskeletal:     Cervical back: Normal range of motion and neck supple.     Right lower leg: Edema present.     Left lower leg: Edema present.  Skin:    General: Skin is warm.     Findings: Bruising present.     Comments: Patient's feet are hyperpigmented, so the helix of the ear and hands bilaterally  The extremity is not significantly cool to touch  Neurological:     Mental Status: He is oriented to person, place, and time.            ED Results / Procedures / Treatments   Labs (all labs ordered are listed, but only abnormal results are displayed) Labs Reviewed  BRAIN NATRIURETIC PEPTIDE - Abnormal; Notable for the following components:      Result Value   B Natriuretic Peptide 1,196.0 (*)    All other components within normal limits  BLOOD GAS, VENOUS - Abnormal; Notable for the following components:   pCO2, Ven >123 (*)    pO2, Ven 31 (*)    Bicarbonate 55.3 (*)    Acid-Base Excess 21.4 (*)    All other components within normal limits  LACTIC ACID, PLASMA - Abnormal; Notable for the following components:   Lactic Acid, Venous 3.1 (*)    All other components  within normal limits  COMPREHENSIVE METABOLIC PANEL - Abnormal; Notable for the following components:   Sodium 133 (*)    Chloride 77 (*)    CO2 40 (*)    Glucose, Bld 122 (*)    BUN 96 (*)    Creatinine, Ser 3.81 (*)    AST 124 (*)    ALT 141 (*)    GFR, Estimated 16 (*)    Anion gap 16 (*)    All other components within normal limits  CBC WITH DIFFERENTIAL/PLATELET - Abnormal; Notable for the following  components:   WBC 14.9 (*)    RBC 6.56 (*)    Hemoglobin 19.2 (*)    HCT 61.6 (*)    RDW 17.7 (*)    Neutro Abs 12.1 (*)    Monocytes Absolute 1.6 (*)    Abs Immature Granulocytes 0.18 (*)    All other components within normal limits  TROPONIN I (HIGH SENSITIVITY) - Abnormal; Notable for the following components:   Troponin I (High Sensitivity) 397 (*)    All other components within normal limits  CULTURE, BLOOD (ROUTINE X 2)  CULTURE, BLOOD (ROUTINE X 2)  SARS CORONAVIRUS 2 BY RT PCR  RESP PANEL BY RT-PCR (FLU A&B, COVID) ARPGX2  CK  LACTIC ACID, PLASMA  PROTIME-INR  APTT  URINALYSIS, ROUTINE W REFLEX MICROSCOPIC  SODIUM, URINE, RANDOM  CREATININE, URINE, RANDOM  HEPARIN LEVEL (UNFRACTIONATED)  TROPONIN I (HIGH SENSITIVITY)    EKG EKG Interpretation  Date/Time:  Sunday March 19 2022 13:01:10 EST Ventricular Rate:  100 PR Interval:  175 QRS Duration: 131 QT Interval:  343 QTC Calculation: 443 R Axis:   -69 Text Interpretation: Sinus tachycardia Atrial premature complexes IVCD, consider atypical RBBB LVH with IVCD, LAD and secondary repol abnrm Inferior infarct, old TWI in the anterior leads- new Confirmed by Varney Biles [phone]) on 03/19/2022 2:16:00 PM  Radiology DG Chest Port 1 View  Result Date: 03/19/2022 CLINICAL DATA:  Post intubation.  Foreign body in throat. EXAM: PORTABLE CHEST 1 VIEW COMPARISON:  Chest x-ray 03/19/2022 FINDINGS: Endotracheal tube tip is 2.6 cm above the carina. The cardiomediastinal silhouette is stable, the heart is enlarged.  Central interstitial prominence persists. No focal lung infiltrate, pleural effusion or pneumothorax. No acute fractures. IMPRESSION: 1. Endotracheal tube tip is 2.6 cm above the carina. 2. Stable cardiomegaly and central interstitial prominence. Electronically Signed   By: Ronney Asters M.D.   On: 03/19/2022 15:26   DG Chest Port 1 View  Result Date: 03/19/2022 CLINICAL DATA:  Questionable sepsis. EXAM: PORTABLE CHEST 1 VIEW COMPARISON:  March 15, 2019 FINDINGS: Persistent cardiomegaly. The hila and mediastinum are unchanged. No pneumothorax. Interstitial prominence is similar in the interval. Probable mild atelectasis at the right base. No other acute abnormalities. IMPRESSION: Findings are most consistent with cardiomegaly and pulmonary venous congestion/mild edema. An atypical infection is not excluded. Mild opacity in the right base is favored to represent atelectasis. Electronically Signed   By: Dorise Bullion III M.D.   On: 03/19/2022 13:28    Procedures .Critical Care  Performed by: Varney Biles, MD Authorized by: Varney Biles, MD   Critical care provider statement:    Critical care time (minutes):  25   Critical care was necessary to treat or prevent imminent or life-threatening deterioration of the following conditions:  Respiratory failure and circulatory failure   Critical care was time spent personally by me on the following activities:  Development of treatment plan with patient or surrogate, discussions with consultants, evaluation of patient's response to treatment, examination of patient, ordering and review of laboratory studies, ordering and review of radiographic studies, ordering and performing treatments and interventions, pulse oximetry, re-evaluation of patient's condition, review of old charts and obtaining history from patient or surrogate Procedure Name: Intubation Date/Time: 03/19/2022 4:17 PM  Performed by: Varney Biles, MDOxygen Delivery Method:  Non-rebreather mask Preoxygenation: Pre-oxygenation with 100% oxygen Induction Type: Rapid sequence Ventilation: Mask ventilation without difficulty Laryngoscope Size: Glidescope and 3 Grade View: Grade II Tube size: 8.0 mm Number of attempts: 1 Placement Confirmation:  ETT inserted through vocal cords under direct vision, CO2 detector and Breath sounds checked- equal and bilateral Secured at: 22 cm Tube secured with: ETT holder Dental Injury: Injury to tongue and Bloody posterior oropharynx       {Document cardiac monitor, telemetry assessment procedure when appropriate:1}  Medications Ordered in ED Medications  albuterol (VENTOLIN HFA) 108 (90 Base) MCG/ACT inhaler 2 puff (has no administration in time range)  ipratropium-albuterol (DUONEB) 0.5-2.5 (3) MG/3ML nebulizer solution 3 mL (3 mLs Nebulization Not Given 03/19/22 1401)  levofloxacin (LEVAQUIN) IVPB 750 mg (has no administration in time range)  albuterol (PROVENTIL,VENTOLIN) solution continuous neb (10 mg/hr Nebulization Not Given 03/19/22 1529)  ipratropium (ATROVENT) nebulizer solution 0.5 mg (0.5 mg Nebulization Not Given 03/19/22 1529)  succinylcholine (ANECTINE) 200 MG/10ML syringe (  Not Given 03/19/22 1530)  etomidate (AMIDATE) 2 MG/ML injection (has no administration in time range)  propofol (DIPRIVAN) 1000 MG/100ML infusion (25 mcg/kg/min  96.3 kg Intravenous Rate/Dose Change 03/19/22 1557)  heparin bolus via infusion 4,000 Units (0 Units Intravenous Hold 03/19/22 1528)  heparin ADULT infusion 100 units/mL (25000 units/270m) (0 Units/hr Intravenous Hold 03/19/22 1529)  LORazepam (ATIVAN) injection 2 mg (has no administration in time range)  fentaNYL (SUBLIMAZE) injection 100 mcg (has no administration in time range)  norepinephrine (LEVOPHED) '4mg'$  in 2564m(0.016 mg/mL) premix infusion (8 mcg/min Intravenous Rate/Dose Change 03/19/22 1617)  ipratropium-albuterol (DUONEB) 0.5-2.5 (3) MG/3ML nebulizer solution (3 mLs  Given  03/19/22 1401)  levofloxacin (LEVAQUIN) IVPB 750 mg (750 mg Intravenous New Bag/Given 03/19/22 1432)  methylPREDNISolone sodium succinate (SOLU-MEDROL) 125 mg/2 mL injection 125 mg (125 mg Intravenous Given 03/19/22 1611)  etomidate (AMIDATE) 2 MG/ML injection (30 mg  Given 03/19/22 1501)  sodium bicarbonate injection 50 mEq (50 mEq Intravenous Given 03/19/22 1500)  phenylephrine 80 mcg/10 mL injection (80 mcg  Given 03/19/22 1510)    ED Course/ Medical Decision Making/ A&P Clinical Course as of 03/19/22 1626  Sun Mar 19, 2022  1621 pCO2, Ven(!!): >123 Pt on bipap 16/8. Patient became worse while in the ER.  Not responding to sternal rub.  With this change, decision was made to intubate.  [AN]  1624 When I intubated the patient, we noticed that he had a foreign body, likely a cap with serrated edges right by the vocal cords.  I was unable to sweep it out with the ET tube with single attempt, plan was made to intubate him first.   Postintubation I looked at the area again, we took a picture as above.  We were unable to use suction to sweep out the foreign body.  I discussed case with Dr. BaRedmond Baseman He requested we try again with [facility], but if you are unable to achieve success then we can let him know.  Medicine team here is requesting that we transfer patient to [facility] be managed by PCCM.  PCCM also aware of the foreign body.  They will accept the patient [AN]  1626 Upon reassessment, there is no foreign body seen.  I suspect he might have swallowed the foreign body.  PCCM, Dr. ByKyung Ruddas been made aware. [AN]    Clinical Course User Index [AN] NaVarney BilesMD                           Medical Decision Making [age]ld male with history of diabetes, COPD, CHF comes in with chief complaint of worsening mental status.  Patient recently was treated for  respiratory illness with antibiotics and steroids.  Patient is arousable to sternal rub only.  He is not providing meaningful history.  History  obtained by calling patient's fianc.  Differential diagnoses based on evaluation includes acute COPD exacerbation with hypercapnic respiratory failure, pneumonia, pulm edema, pleural effusion, CHF exacerbation, ACS.  He has dusky distal extremity - slightly cool only. Maybe worse than normal per history from fiance. Cardiogenic shock considered but less likely. PE also considered - with signs of hypoxia.   Amount and/or Complexity of Data Reviewed Labs: ordered. Radiology: ordered.  Risk Prescription drug management. Decision regarding hospitalization.    Final Clinical Impression(s) / ED Diagnoses Final diagnoses:  Shock ([place])  Acute respiratory failure with hypercapnia ([facility])  AKI (acute kidney injury) ([place])  Elevated troponin I level    Rx / DC Orders ED Discharge Orders     None

## 2022-03-19 NOTE — ED Notes (Signed)
Right tibial pulse found with doppler, unable to find pulses in left foot.

## 2022-03-19 NOTE — ED Notes (Signed)
Dr Kathrynn Humble in room to attempt to remove bottle cap from esophagus that was visualized via Glidescope during intubation.

## 2022-03-19 NOTE — ED Notes (Signed)
[facility] will page ENT.

## 2022-03-19 NOTE — H&P (Signed)
NAME:  Brian Parsons, MRN:  [id number], DOB:  11/02/1950, LOS: 0 ADMISSION DATE:  03/19/2022 CHIEF COMPLAINT: Acute hypercapnic respiratory failure  History of Present Illness:  71 year old smoker (70 pack years) with a history of severe COPD, hypertension with chronic HFpEF, associated chronic hypoxemic respiratory failure on 2-2.5 L/min and resultant polycythemia for which he has undergone phlebotomy.  Also with diabetes, nonobstructive CAD. He developed URI symptoms about 4 days prior to admission, was treated as an outpatient with antibiotics and steroids but continued to worsen.  He developed hallucinations on the date of admission, then ultimately obtundation.  He was seen in the emergency department today pH and found to have a severe respiratory acidosis, required intubation and mechanical ventilation.  During laryngoscopy for his intubation a foreign body was noted in the posterior pharynx.  Once he was stabilized and ventilated a repeat laryngoscopy was performed but the object could not be seen, question swallowed.  He has had soft blood pressure since he was intubated, sedated. He transfers to [facility] for further care.  Pertinent  Medical History   Past Medical History:  Diagnosis Date   Bilateral leg edema    CHF (congestive heart failure) (HCC)    COPD (chronic obstructive pulmonary disease) ([place])    Coronary atherosclerosis    Nonobstructive at cardiac catheterization April 2017   Diabetes mellitus without complication ([place])    Essential hypertension    On home O2    2L N/C prn   Polycythemia     Significant Hospital Events: Including procedures, antibiotic start and stop dates in addition to other pertinent events     Interim History / Subjective:  No foreign body was seen on repeat lary He was started on peripheral norepinephrine prior to transfer, currently on 9 Propofol 25, just received fentanyl 100 mcg x 1   Objective   Blood pressure 128/69, pulse 69,  temperature 98.3 F (36.8 C), temperature source Oral, resp. rate 16, height 6' (1.829 m), weight 96.3 kg, SpO2 98 %.    Vent Mode: PRVC FiO2 (%):  [40 %] 40 % Set Rate:  [16 bmp] 16 bmp Vt Set:  [500 mL] 500 mL PEEP:  [5 cmH20] 5 cmH20 Plateau Pressure:  [18 cmH20] 18 cmH20   Intake/Output Summary (Last 24 hours) at 03/19/2022 1743 Last data filed at 03/19/2022 1631 Gross per 24 hour  Intake 252.5 ml  Output --  Net 252.5 ml   Filed Weights   03/19/22 1309  Weight: 96.3 kg    Examination: General: Obese chronically ill-appearing man ventilated, no distress HENT: ET tube in place, pupils equal Lungs: Bilateral expiratory wheezes all fields Cardiovascular: Distant, regular, no murmur Abdomen: Protuberant, tympanitic, hypoactive bowel sounds Extremities: Cyanotic changes, chronic venous stasis changes noted both lower extremities/feet, fingers, ears Neuro: Sedated, does not wake to voice, pain, stimulation. GU: Unremarkable  Resolved Hospital Problem list     Assessment & Plan:   Acute on chronic hypercapnic and hypoxic respiratory failure, principally due to acute exacerbation COPD precipitated by URI.  Question component of volume overload, cardiogenic pulmonary edema given history of chronic diastolic CHF -PRVC 8 cc/kg -Careful to avoid intrinsic PEEP -Sedation as per PAD protocol -VAP prevention orders  Acute exacerbation COPD -Scheduled bronchodilators, DuoNeb.  Hold home Trelegy for now -Scheduled corticosteroids -No clear evidence for pneumonia on chest x-ray, change Levaquin to azithromycin x3 days  Acute on chronic HFpEF -Tight blood pressure control.  Home blood pressure regimen on hold initially while deeply sedated (  carvedilol).  Restart when indicated -Hold home torsemide, metolazone and dose diuretics daily depending on respiratory status, renal status and hemodynamics  Foreign body posterior pharynx noted on laryngoscopy when he was ET  intubated -Consider repeat inspection with laryngoscope or glide scope. -No evidence of a foreign body on chest x-ray  CAD, medically managed Elevated troponin, consider stress-induced non-STEMI -ASA 81 -Atorvastatin -Beta-blocker on hold as above -Heparin initiated in the ED, based on high-sensitivity troponin plateau 397, likely do not need to continue.  Shock.  In absence of any clear evidence for infection, only mild stress troponin leak suspect that this is due to sedating medications. 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  Gout -Continue home allopurinol  IBS -Hold home Linzess for now   Best Practice (right click and "Reselect all SmartList Selections" daily)   Diet/type: NPO DVT prophylaxis: prophylactic heparin  GI prophylaxis: PPI Lines: N/A Foley:  N/A Code Status:  full code Last date of multidisciplinary goals of care discussion [pending]  Labs   CBC: Recent Labs  Lab 03/19/22 1310  WBC 14.9*  NEUTROABS 12.1*  HGB 19.2*  HCT 61.6*  MCV 93.9  PLT 854    Basic Metabolic Panel: Recent Labs  Lab 03/19/22 1310  NA 133*  K 5.1  CL 77*  CO2 40*  GLUCOSE 122*  BUN 96*  CREATININE 3.81*  CALCIUM 8.9   GFR: Estimated Creatinine Clearance: 20.8 mL/min (A) (by C-G formula based on SCr of 3.81 mg/dL (H)). Recent Labs  Lab 03/19/22 1310 03/19/22 1531  WBC 14.9*  --   LATICACIDVEN 1.5 3.1*    Liver Function Tests: Recent Labs  Lab 03/19/22 1310  AST 124*  ALT 141*  ALKPHOS 83  BILITOT 1.1  PROT 7.2  ALBUMIN 3.7   No results for input(s): "LIPASE", "AMYLASE" in the last 168 hours. No  results for input(s): "AMMONIA" in the last 168 hours.  ABG    Component Value Date/Time   PHART 7.352 08/03/2014 1620   PCO2ART 68.2 (HH) 08/03/2014 1620   PO2ART 52.3 (L) 08/03/2014 1620   HCO3 55.3 (H) 03/19/2022 1402   TCO2 30.3 08/03/2014 1620   O2SAT 35.5 03/19/2022 1402     Coagulation Profile: Recent Labs  Lab 03/19/22 1310  INR 1.1    Cardiac Enzymes: Recent Labs  Lab 03/19/22 1310  CKTOTAL 117    HbA1C: Hemoglobin A1C  Date/Time Value Ref Range Status  12/27/2021 09:32 AM 5.5 4.0 - 5.6 % Final  11/25/2018 12:00 AM 8.0  Final   HbA1c, POC (controlled diabetic range)  Date/Time Value Ref Range Status  05/18/2021 09:14 AM 5.4 0.0 - 7.0 % Final  05/17/2020 08:48 AM 5.7 0.0 - 7.0 % Final    CBG: No results for input(s): "GLUCAP" in the last 168 hours.  Review of Systems:   Unable to obtain  Past Medical History:  He,  has a past medical history of Bilateral leg edema, CHF (congestive heart failure) ([place]), COPD (chronic obstructive pulmonary disease) ([place]), Coronary atherosclerosis, Diabetes mellitus without complication ([place]), Essential hypertension, On home O2, and Polycythemia.   Surgical History:   Past Surgical History:  Procedure Laterality Date   CARDIAC CATHETERIZATION N/A 08/10/2015  Procedure: Left Heart Cath and Coronary Angiography;  Surgeon: Troy Sine, MD;  Location: [facility];  Service: Cardiovascular;  Laterality: N/A;     Social History:   reports that he has been smoking cigarettes. He started smoking about 60 years ago. He has a 50.00 pack-year smoking history. He has never used smokeless tobacco. He reports that he does not drink alcohol and does not use drugs.   Family History:  His family history includes Emphysema in his father; Stroke in his mother.   Allergies Allergies  Allergen Reactions   Amoxicillin Hives    Other reaction(s): rash   Other     Other reaction(s): Unknown   Penicillin G     Other  reaction(s): rash   Penicillins Hives    Has patient had a PCN reaction causing immediate rash, facial/tongue/throat swelling, SOB or lightheadedness with hypotension: Unknown Has patient had a PCN reaction causing severe rash involving mucus membranes or skin necrosis: No Has patient had a PCN reaction that required hospitalization No Has patient had a PCN reaction occurring within the last 10 years: No If all of the above answers are "NO", then may proceed with Cephalosporin use.      Home Medications  Prior to Admission medications   Medication Sig Start Date End Date Taking? Authorizing Provider  Accu-Chek Softclix Lancets lancets  03/26/21   [provider]  albuterol (ACCUNEB) 0.63 MG/3ML nebulizer solution Take 3 mLs (0.63 mg total) by nebulization every 4 (four) hours as needed. 10/14/19   Tanda Rockers, MD  albuterol (VENTOLIN HFA) 108 (90 Base) MCG/ACT inhaler Inhale 2 puffs into the lungs every 6 (six) hours as needed for wheezing or shortness of breath.    [provider]  allopurinol (ZYLOPRIM) 100 MG tablet Take 200 mg by mouth 2 (two) times daily.  08/06/17   [provider]  aspirin EC 81 MG tablet Take 81 mg by mouth daily.    [provider]  atorvastatin (LIPITOR) 20 MG tablet Take 20 mg by mouth daily at 6 PM.  03/01/17 05/17/20  [provider]  azelastine (ASTELIN) 0.1 % nasal spray 1 puff in each nostril 07/19/20   [provider]  Blood Glucose Monitoring Suppl (TRUE METRIX METER) DEVI See admin instructions. 07/07/19   [provider]  carvedilol (COREG) 3.125 MG tablet Take 1 tablet (3.125 mg total) by mouth 2 (two) times daily with a meal. 10/03/17 10/14/19  Johnson, Clanford L, MD  DULoxetine (CYMBALTA) 30 MG capsule Take 30 mg by mouth daily. 10/31/21   [provider]  escitalopram (LEXAPRO) 20 MG tablet Take 20 mg by mouth daily.    [provider]  fluticasone Asencion Islam) 50 MCG/ACT nasal  spray 1 spray in each nostril 07/19/20   [provider]  Fluticasone-Umeclidin-Vilant (TRELEGY ELLIPTA) 100-62.5-25 MCG/ACT AEPB One click each am 81/82/99   Tanda Rockers, MD  ipratropium-albuterol (DUONEB) 0.5-2.5 (3) MG/3ML SOLN Take by nebulization. 04/18/21   [provider]  LINZESS 145 MCG CAPS capsule Take 145 mcg by mouth daily. 09/08/21   [provider]  Magnesium Gluconate 500 (27 Mg) MG TABS Take 500 mg by mouth in the morning and at bedtime.     [provider]  metolazone (ZAROXOLYN) 2.5 MG tablet Take 2.5 mg by mouth every other day.  10/01/18   [provider]  OXYGEN Inhale into the lungs. 2.5L at home use    [provider]  potassium  chloride (MICRO-K) 10 MEQ CR capsule TAKE 1 CAPSULE BY MOUTH ONCE DAILY WITH FOOD    [provider]  sildenafil (VIAGRA) 50 MG tablet Take by mouth. 04/18/21   [provider]  torsemide (DEMADEX) 20 MG tablet Take 80 mg by mouth 2 (two) times daily.     [provider]  vitamin B-12 (CYANOCOBALAMIN) 1000 MCG tablet Take 1,000 mcg by mouth daily.    [provider]     Critical care time: 14 min    Baltazar Apo, MD, PhD 03/19/2022, 6:50 PM [facility] [phone] or if no answer before 7:00PM call [phone] For any issues after 7:00PM please call eLink [phone]

## 2022-03-19 NOTE — ED Notes (Signed)
Date and time results received: 03/19/22 1600 (use smartphrase ".now" to insert current time)  Test: Lactic Critical Value: 3.1  Name of Provider Notified: Dr Kathrynn Humble  Orders Received? Or Actions Taken?: Orders Received - See Orders for details

## 2022-03-19 NOTE — ED Notes (Signed)
RT called due to pt de-sating on bipap. Edp aware.

## 2022-03-20 ENCOUNTER — Inpatient Hospital Stay (HOSPITAL_COMMUNITY): Payer: Medicare HMO

## 2022-03-20 DIAGNOSIS — J9601 Acute respiratory failure with hypoxia: Secondary | ICD-10-CM | POA: Diagnosis not present

## 2022-03-20 DIAGNOSIS — J9602 Acute respiratory failure with hypercapnia: Secondary | ICD-10-CM

## 2022-03-20 DIAGNOSIS — R7889 Finding of other specified substances, not normally found in blood: Secondary | ICD-10-CM

## 2022-03-20 LAB — RESPIRATORY PANEL BY PCR

## 2022-03-20 LAB — ECHOCARDIOGRAM COMPLETE
AR max vel: 1.74 cm2
AV Area VTI: 1.61 cm2
AV Area mean vel: 1.65 cm2
AV Mean grad: 3 mmHg
AV Peak grad: 5.6 mmHg
Ao pk vel: 1.18 m/s
Height: 72 in
S' Lateral: 3.7 cm
Weight: 3396.8 oz

## 2022-03-20 LAB — POCT I-STAT 7, (LYTES, BLD GAS, ICA,H+H)
Acid-Base Excess: 17 mmol/L — ABNORMAL HIGH (ref 0.0–2.0)
Acid-Base Excess: 16 mmol/L — ABNORMAL HIGH (ref 0.0–2.0)
Bicarbonate: 43.8 mmol/L — ABNORMAL HIGH (ref 20.0–28.0)
Bicarbonate: 45.6 mmol/L — ABNORMAL HIGH (ref 20.0–28.0)
Calcium, Ion: 1.01 mmol/L — ABNORMAL LOW (ref 1.15–1.40)
Calcium, Ion: 1.07 mmol/L — ABNORMAL LOW (ref 1.15–1.40)
HCT: 57 % — ABNORMAL HIGH (ref 39.0–52.0)
HCT: 55 % — ABNORMAL HIGH (ref 39.0–52.0)
Hemoglobin: 19.4 g/dL — ABNORMAL HIGH (ref 13.0–17.0)
Hemoglobin: 18.7 g/dL — ABNORMAL HIGH (ref 13.0–17.0)
O2 Saturation: 96 %
O2 Saturation: 96 %
Patient temperature: 36.1
Potassium: 4.2 mmol/L (ref 3.5–5.1)
Potassium: 4 mmol/L (ref 3.5–5.1)
Sodium: 128 mmol/L — ABNORMAL LOW (ref 135–145)
Sodium: 131 mmol/L — ABNORMAL LOW (ref 135–145)
TCO2: 45 mmol/L — ABNORMAL HIGH (ref 22–32)
TCO2: 48 mmol/L — ABNORMAL HIGH (ref 22–32)
pCO2 arterial: 50.4 mmHg — ABNORMAL HIGH (ref 32–48)
pCO2 arterial: 70.1 mmHg (ref 32–48)
pH, Arterial: 7.544 — ABNORMAL HIGH (ref 7.35–7.45)
pH, Arterial: 7.421 (ref 7.35–7.45)
pO2, Arterial: 73 mmHg — ABNORMAL LOW (ref 83–108)
pO2, Arterial: 88 mmHg (ref 83–108)

## 2022-03-20 LAB — CBC
HCT: 55.1 % — ABNORMAL HIGH (ref 39.0–52.0)
Hemoglobin: 18.7 g/dL — ABNORMAL HIGH (ref 13.0–17.0)
MCH: 30 pg (ref 26.0–34.0)
MCHC: 33.9 g/dL (ref 30.0–36.0)
MCV: 88.3 fL (ref 80.0–100.0)
Platelets: 169 10*3/uL (ref 150–400)
RBC: 6.24 MIL/uL — ABNORMAL HIGH (ref 4.22–5.81)
RDW: 16.4 % — ABNORMAL HIGH (ref 11.5–15.5)
WBC: 12.9 10*3/uL — ABNORMAL HIGH (ref 4.0–10.5)
nRBC: 0.2 % (ref 0.0–0.2)

## 2022-03-20 LAB — TROPONIN I (HIGH SENSITIVITY): Troponin I (High Sensitivity): 203 ng/L (ref <18)

## 2022-03-20 LAB — GLUCOSE, CAPILLARY
Glucose-Capillary: 132 mg/dL — ABNORMAL HIGH (ref 70–99)
Glucose-Capillary: 148 mg/dL — ABNORMAL HIGH (ref 70–99)
Glucose-Capillary: 152 mg/dL — ABNORMAL HIGH (ref 70–99)
Glucose-Capillary: 160 mg/dL — ABNORMAL HIGH (ref 70–99)
Glucose-Capillary: 175 mg/dL — ABNORMAL HIGH (ref 70–99)
Glucose-Capillary: 186 mg/dL — ABNORMAL HIGH (ref 70–99)
Glucose-Capillary: 189 mg/dL — ABNORMAL HIGH (ref 70–99)

## 2022-03-20 LAB — TRIGLYCERIDES: Triglycerides: 241 mg/dL — ABNORMAL HIGH (ref <150)

## 2022-03-20 LAB — HEPARIN LEVEL (UNFRACTIONATED)
Heparin Unfractionated: <0.1 IU/mL — ABNORMAL LOW (ref 0.30–0.70)
Heparin Unfractionated: 0.45 IU/mL (ref 0.30–0.70)

## 2022-03-20 LAB — BASIC METABOLIC PANEL
Anion gap: 17 — ABNORMAL HIGH (ref 5–15)
BUN: 97 mg/dL — ABNORMAL HIGH (ref 8–23)
CO2: 37 mmol/L — ABNORMAL HIGH (ref 22–32)
Calcium: 8.4 mg/dL — ABNORMAL LOW (ref 8.9–10.3)
Chloride: 77 mmol/L — ABNORMAL LOW (ref 98–111)
Creatinine, Ser: 3.1 mg/dL — ABNORMAL HIGH (ref 0.61–1.24)
GFR, Estimated: 20 mL/min — ABNORMAL LOW (ref >60)
Glucose, Bld: 152 mg/dL — ABNORMAL HIGH (ref 70–99)
Potassium: 4.5 mmol/L (ref 3.5–5.1)
Sodium: 131 mmol/L — ABNORMAL LOW (ref 135–145)

## 2022-03-20 LAB — LACTIC ACID, PLASMA: Lactic Acid, Venous: 2.2 mmol/L (ref 0.5–1.9)

## 2022-03-20 MED ORDER — HEPARIN BOLUS VIA INFUSION
4800.0000 [IU] | Freq: Once | INTRAVENOUS | Status: AC
  Administered 2022-03-20: 4800 [IU] via INTRAVENOUS
  Filled 2022-03-20: qty 4800

## 2022-03-20 MED ORDER — PHENOL 1.4 % MT LIQD
1.0000 | OROMUCOSAL | Status: DC | PRN
  Filled 2022-03-20: qty 177

## 2022-03-20 MED ORDER — PROSOURCE TF20 ENFIT COMPATIBL EN LIQD
60.0000 mL | Freq: Two times a day (BID) | ENTERAL | Status: DC
  Administered 2022-03-20: 60 mL
  Filled 2022-03-20: qty 60

## 2022-03-20 MED ORDER — ORAL CARE MOUTH RINSE
15.0000 mL | OROMUCOSAL | Status: DC
  Administered 2022-03-20 – 2022-03-21 (×9): 15 mL via OROMUCOSAL

## 2022-03-20 MED ORDER — VITAL HIGH PROTEIN PO LIQD: 1000.0000 mL | ORAL | Status: DC

## 2022-03-20 MED ORDER — FAMOTIDINE 20 MG PO TABS
10.0000 mg | ORAL_TABLET | Freq: Every day | ORAL | Status: DC
  Administered 2022-03-20: 10 mg
  Filled 2022-03-20: qty 1

## 2022-03-20 MED ORDER — ORAL CARE MOUTH RINSE: 15.0000 mL | OROMUCOSAL | Status: DC | PRN

## 2022-03-20 MED ORDER — HEPARIN (PORCINE) 25000 UT/250ML-% IV SOLN
1350.0000 [IU]/h | INTRAVENOUS | Status: DC
  Administered 2022-03-20 (×2): 1350 [IU]/h via INTRAVENOUS
  Filled 2022-03-20 (×2): qty 250

## 2022-03-20 MED ORDER — OSELTAMIVIR PHOSPHATE 6 MG/ML PO SUSR
30.0000 mg | Freq: Every day | ORAL | Status: DC
  Administered 2022-03-20: 30 mg
  Filled 2022-03-20 (×4): qty 12.5

## 2022-03-20 MED ORDER — VITAL 1.5 CAL PO LIQD
1000.0000 mL | ORAL | Status: DC
  Administered 2022-03-20: 1000 mL

## 2022-03-20 MED ORDER — OSELTAMIVIR PHOSPHATE 6 MG/ML PO SUSR: 75.0000 mg | Freq: Two times a day (BID) | ORAL | Status: DC

## 2022-03-20 MED ORDER — LACTATED RINGERS IV SOLN: INTRAVENOUS | Status: DC

## 2022-03-20 MED ORDER — PERFLUTREN LIPID MICROSPHERE
1.0000 mL | INTRAVENOUS | Status: AC | PRN
  Administered 2022-03-20: 3 mL via INTRAVENOUS

## 2022-03-20 NOTE — Progress Notes (Signed)
  Echocardiogram 2D Echocardiogram has been performed.  Brian Parsons 03/20/2022, 11:51 AM

## 2022-03-20 NOTE — Progress Notes (Signed)
ANTICOAGULATION CONSULT NOTE - Initial Consult  Pharmacy Consult for heparin gtt Indication: atrial fibrillation  Allergies  Allergen Reactions   Amoxicillin Hives    Other reaction(s): rash   Other     Other reaction(s): Unknown   Penicillin G     Other reaction(s): rash   Penicillins Hives    Has patient had a PCN reaction causing immediate rash, facial/tongue/throat swelling, SOB or lightheadedness with hypotension: Unknown Has patient had a PCN reaction causing severe rash involving mucus membranes or skin necrosis: No Has patient had a PCN reaction that required hospitalization No Has patient had a PCN reaction occurring within the last 10 years: No If all of the above answers are "NO", then may proceed with Cephalosporin use.     Patient Measurements: Height: 6' (182.9 cm) Weight: 96.3 kg (212 lb 4.8 oz) IBW/kg (Calculated) : 77.6 Heparin Dosing Weight: 96.3 kg  Vital Signs: Temp: 99.3 F (37.4 C) (12/04 0354) Temp Source: Oral (12/04 0354) BP: 121/61 (12/04 0130) Pulse Rate: 63 (12/04 0130)  Labs: Recent Labs    03/19/22 1310 03/19/22 1531 03/20/22 0052 03/20/22 0209 03/20/22 0217  HGB 19.2*  --   --  19.4*  --   HCT 61.6*  --   --  57.0*  --   PLT 152  --   --   --   --   APTT 28  --   --   --   --   LABPROT 14.6  --   --   --   --   INR 1.1  --   --   --   --   HEPARINUNFRC  --   --   --   --  <0.10*  CREATININE 3.81*  --  3.10*  --   --   CKTOTAL 117  --   --   --   --   TROPONINIHS 397* 358*  --   --   --     Estimated Creatinine Clearance: 25.5 mL/min (A) (by C-G formula based on SCr of 3.1 mg/dL (H)).   Medical History: Past Medical History:  Diagnosis Date   Bilateral leg edema    CHF (congestive heart failure) (HCC)    COPD (chronic obstructive pulmonary disease) (HCC)    Coronary atherosclerosis    Nonobstructive at cardiac catheterization April 2017   Diabetes mellitus without complication ([place])    Essential hypertension    On home  O2    2L N/C prn   Polycythemia     Assessment: 71 yo M with new afib - no anticoagulants PTA. No s/sx bleeding  Hgb 19.4/Plt 152 (known polycythemia, workup outpatient non remarkable)  Goal of Therapy:  Heparin level 0.3-0.7 units/ml Monitor platelets by anticoagulation protocol: Yes   Plan:  Heparin 4800 u x1 followed by heparin at 1350 units/hr  8hr HL at 1600 Daily HL, CBC F/u s/sx bleeding, transition / necessity for enteral therapies  Wilson Singer, PharmD Clinical Pharmacist 03/20/2022 7:45 AM

## 2022-03-20 NOTE — Progress Notes (Signed)
Pt transported on 100% O2 w/vent settings as per flowsheet to Xray then CT then returned to 3M04 w/out incidence. PT tol well.

## 2022-03-20 NOTE — Progress Notes (Signed)
ANTICOAGULATION CONSULT NOTE - Initial Consult  Pharmacy Consult for heparin gtt Indication: atrial fibrillation  Patient Measurements: Height: 6' (182.9 cm) Weight: 96.3 kg (212 lb 4.8 oz) IBW/kg (Calculated) : 77.6 Heparin Dosing Weight: 96.3 kg  Vital Signs: Temp: 98 F (36.7 C) (12/04 1520) Temp Source: Axillary (12/04 1520) BP: 102/61 (12/04 1600) Pulse Rate: 56 (12/04 1615)  Labs: Recent Labs    03/19/22 1310 03/19/22 1531 03/20/22 0052 03/20/22 0209 03/20/22 0217 03/20/22 0556 03/20/22 1214 03/20/22 1630  HGB 19.2*  --  18.7* 19.4*  --   --  18.7*  --   HCT 61.6*  --  55.1* 57.0*  --   --  55.0*  --   PLT 152  --  169  --   --   --   --   --   APTT 28  --   --   --   --   --   --   --   LABPROT 14.6  --   --   --   --   --   --   --   INR 1.1  --   --   --   --   --   --   --   HEPARINUNFRC  --   --   --   --  <0.10*  --   --  0.45  CREATININE 3.81*  --  3.10*  --   --   --   --   --   CKTOTAL 117  --   --   --   --   --   --   --   TROPONINIHS 397* 358*  --   --   --  203*  --   --    Estimated Creatinine Clearance: 25.5 mL/min (A) (by C-G formula based on SCr of 3.1 mg/dL (H)).  Medical History: Past Medical History:  Diagnosis Date   Bilateral leg edema    CHF (congestive heart failure) (HCC)    COPD (chronic obstructive pulmonary disease) (HCC)    Coronary atherosclerosis    Nonobstructive at cardiac catheterization April 2017   Diabetes mellitus without complication ([place])    Essential hypertension    On home O2    2L N/C prn   Polycythemia    Assessment: 71 yo M with new afib - no anticoagulants PTA. No s/sx bleeding  PM: Heparin level is therapeutic at 0.45. No issues noted.   Goal of Therapy:  Heparin level 0.3-0.7 units/ml Monitor platelets by anticoagulation protocol: Yes   Plan:  - Continue heparin rate @ 1350 units/hr - HL and CBC daily  Lorenso Courier, PharmD Clinical Pharmacist 03/20/2022 5:21 PM

## 2022-03-20 NOTE — Progress Notes (Signed)
Initial Nutrition Assessment  DOCUMENTATION CODES:   Non-severe (moderate) malnutrition in context of chronic illness  INTERVENTION:   Initiate tube feeds via OG tube: - Start Vital 1.5 @ 20 ml/hr and advance by 10 ml q 6 hours to goal rate of 60 ml/hr (1440 ml/day) - PROSource TF20 60 ml BID  Tube feeding regimen at goal rate provides 2320 kcal, 137 grams of protein, and 1100 ml of H2O.  Monitor magnesium, potassium, and phosphorus BID for at least 3 days, MD to replete as needed, as pt is at risk for refeeding syndrome given malnutrition.  NUTRITION DIAGNOSIS:   Moderate Malnutrition related to chronic illness (COPD, CHF) as evidenced by mild fat depletion, moderate muscle depletion.  GOAL:   Patient will meet greater than or equal to 90% of their needs  MONITOR:   Vent status, Labs, Weight trends, TF tolerance, Skin, I & O's  REASON FOR ASSESSMENT:   Ventilator, Consult Enteral/tube feeding initiation and management  ASSESSMENT:   71 year old male who presented to the ED on 12/03 with respiratory distress. PMH of severe COPD, DM, HTN, CHF, tobacco use, CAD, depression, gout, IBS, polycythemia. Pt required intubation. Pt admitted with acute on chronic hypercapnic respiratory failure, AKI on CKD, shock.  Discussed pt with RN and during ICU rounds. Consult received for enteral nutrition initiation and management. Pt with OG tube tip in distal stomach per abdominal x-ray. Will start tube feeds at trickle rate and slowly advance to goal. Discussed with RN.  Unable to obtain diet and weight history at this time. Reviewed weight history in chart. Pt with recent weight gain.  Patient is currently intubated on ventilator support MV: 9.3 L/min Temp (24hrs), Avg:98.6 F (37 C), Min:97.6 F (36.4 C), Max:99.3 F (37.4 C) BP (cuff): 94/55 MAP (cuff): 69  Drips: Propofol: 25 mcg/kg/hr (provides 381 kcal daily from lipid) Heparin Levophed: 4 mcg LR: 75 ml/hr  Medications  reviewed and include: colace, pepcid, IV solu-medrol, miralax, IV abx  Labs reviewed: sodium 128, BUN 97, creatinine 3.10, ionized calcium 1.01, elevated LFTs, TG 241, lactic acid 2.2, WBC 12.9 CBG's: 123-175  UOP: 650 ml x 12 hours I/O's: +325 ml since admit  NUTRITION - FOCUSED PHYSICAL EXAM:  Flowsheet Row Most Recent Value  Orbital Region Mild depletion  Upper Arm Region Mild depletion  Thoracic and Lumbar Region Mild depletion  Buccal Region Unable to assess  Temple Region Moderate depletion  Clavicle Bone Region Moderate depletion  Clavicle and Acromion Bone Region Moderate depletion  Scapular Bone Region Mild depletion  Dorsal Hand Moderate depletion  Patellar Region Mild depletion  Anterior Thigh Region Moderate depletion  Posterior Calf Region Mild depletion  Edema (RD Assessment) Mild  [BLE]  Hair Reviewed  Eyes Reviewed  Mouth Reviewed  Skin Reviewed  Nails Reviewed       Diet Order:   Diet Order             Diet NPO time specified  Diet effective now                   EDUCATION NEEDS:   No education needs have been identified at this time  Skin:  Skin Assessment: Skin Integrity Issues: Stage I: sacrum Other: puncture wound to sacrum  Last BM:  no documented BM  Height:   Ht Readings from Last 1 Encounters:  03/19/22 6' (1.829 m)    Weight:   Wt Readings from Last 1 Encounters:  03/19/22 96.3 kg    BMI:  Body mass index is 28.79 kg/m.  Estimated Nutritional Needs:   Kcal:  2200-2400  Protein:  120-140 grams  Fluid:  >2.0 L    Gustavus Bryant, MS, RD, LDN Inpatient Clinical Dietitian Please see AMiON for contact information.

## 2022-03-20 NOTE — Progress Notes (Signed)
Transported to CT scan and back without complications.  

## 2022-03-20 NOTE — Progress Notes (Signed)
NAME:  Brian Parsons, MRN:  [id number], DOB:  07/08/1950, LOS: 1 ADMISSION DATE:  03/19/2022 CHIEF COMPLAINT: Acute hypercapnic respiratory failure  History of Present Illness:  71 year old smoker (70 pack years) with a history of severe COPD, hypertension with chronic HFpEF, associated chronic hypoxemic respiratory failure on 2-2.5 L/min and resultant polycythemia for which he has undergone phlebotomy.  Also with diabetes, nonobstructive CAD. He developed URI symptoms about 4 days prior to admission, was treated as an outpatient with antibiotics and steroids but continued to worsen.  He developed hallucinations on the date of admission, then ultimately obtundation.  He was seen in the emergency department today pH and found to have a severe respiratory acidosis, required intubation and mechanical ventilation.  During laryngoscopy for his intubation a foreign body was noted in the posterior pharynx.  Once he was stabilized and ventilated a repeat laryngoscopy was performed but the object could not be seen, question swallowed.  He has had soft blood pressure since he was intubated, sedated. He transfers to [facility] for further care.  Pertinent  Medical History   Past Medical History:  Diagnosis Date   Bilateral leg edema    CHF (congestive heart failure) (HCC)    COPD (chronic obstructive pulmonary disease) ([place])    Coronary atherosclerosis    Nonobstructive at cardiac catheterization April 2017   Diabetes mellitus without complication ([place])    Essential hypertension    On home O2    2L N/C prn   Polycythemia     Significant Hospital Events: Including procedures, antibiotic start and stop dates in addition to other pertinent events   Repeat laryngoscopy neg, CT chest/abd neg  Interim History / Subjective:  Intubated/sedated and on pressors.  Objective   Blood pressure 121/61, pulse 63, temperature 99.3 F (37.4 C), temperature source Oral, resp. rate 16, height 6' (1.829 m), weight 96.3  kg, SpO2 100 %.    Vent Mode: PRVC FiO2 (%):  [40 %-50 %] 50 % Set Rate:  [16 bmp] 16 bmp Vt Set:  [500 mL-620 mL] 620 mL PEEP:  [5 cmH20-6 cmH20] 6 cmH20 Plateau Pressure:  [17 cmH20-18 cmH20] 17 cmH20   Intake/Output Summary (Last 24 hours) at 03/20/2022 0710 Last data filed at 03/20/2022 0600 Gross per 24 hour  Intake 1085.66 ml  Output 650 ml  Net 435.66 ml    Filed Weights   03/19/22 1309  Weight: 96.3 kg    Examination: Chronically ill appearing man in NAD Diminished breath sounds bilaterally +bronchospasm on vent waveform (peak >> plat) Abd soft Chronic severe venous insufficiency changes vs. Hyperpigmentation of polycythemia RASS -5, awaiting SAT  CT chest/abd personally reviewed  Resolved Hospital Problem list     Assessment & Plan:  Acute on chronic hypercapneic resp failure Presumed AECOPD, r/o viral illness Cor pulmonale Secondary polycythemia AKI on CKD- no obstruction on CT Shock related to sedation Pancreatic lesion- OP f/u Question of foreign body- none seen on repeat laryngoscopy or on imaging Afib- new diagnosis for him  - CT neck (foreign body finish workup), head (coma in ER, afib) - Send RVP - Gentle hydration - Vent support, VAP prevention bundle - Triple therapy nebs, steroids - Will see how he does with SAT/SBT, would be fair candidate for extubation to intermittent bipap should he look okay and imaging benign - Needs OP f/u for pancreatic lesion incidentally noted on CT  Best Practice (right click and "Reselect all SmartList Selections" daily)   Diet/type: start TF DVT prophylaxis: systemic GI  prophylaxis: PPI Lines: N/A Foley:  N/A Code Status:  full code Last date of multidisciplinary goals of care discussion [pending]  35 min cc time Erskine Emery MD PCCM

## 2022-03-21 ENCOUNTER — Other Ambulatory Visit (HOSPITAL_COMMUNITY): Payer: Self-pay

## 2022-03-21 ENCOUNTER — Encounter (HOSPITAL_COMMUNITY): Payer: Self-pay | Admitting: Hematology

## 2022-03-21 DIAGNOSIS — J9601 Acute respiratory failure with hypoxia: Secondary | ICD-10-CM | POA: Diagnosis not present

## 2022-03-21 DIAGNOSIS — E440 Moderate protein-calorie malnutrition: Secondary | ICD-10-CM | POA: Insufficient documentation

## 2022-03-21 LAB — GLUCOSE, CAPILLARY
Glucose-Capillary: 132 mg/dL — ABNORMAL HIGH (ref 70–99)
Glucose-Capillary: 124 mg/dL — ABNORMAL HIGH (ref 70–99)
Glucose-Capillary: 118 mg/dL — ABNORMAL HIGH (ref 70–99)
Glucose-Capillary: 134 mg/dL — ABNORMAL HIGH (ref 70–99)

## 2022-03-21 LAB — CBC
HCT: 56.2 % — ABNORMAL HIGH (ref 39.0–52.0)
Hemoglobin: 17.3 g/dL — ABNORMAL HIGH (ref 13.0–17.0)
MCH: 28.7 pg (ref 26.0–34.0)
MCHC: 30.8 g/dL (ref 30.0–36.0)
MCV: 93.4 fL (ref 80.0–100.0)
Platelets: 110 10*3/uL — ABNORMAL LOW (ref 150–400)
RBC: 6.02 MIL/uL — ABNORMAL HIGH (ref 4.22–5.81)
RDW: 16.8 % — ABNORMAL HIGH (ref 11.5–15.5)
WBC: 9 10*3/uL (ref 4.0–10.5)
nRBC: 0 % (ref 0.0–0.2)

## 2022-03-21 LAB — BASIC METABOLIC PANEL
Anion gap: 10 (ref 5–15)
BUN: 87 mg/dL — ABNORMAL HIGH (ref 8–23)
CO2: 41 mmol/L — ABNORMAL HIGH (ref 22–32)
Calcium: 8.8 mg/dL — ABNORMAL LOW (ref 8.9–10.3)
Chloride: 87 mmol/L — ABNORMAL LOW (ref 98–111)
Creatinine, Ser: 1.99 mg/dL — ABNORMAL HIGH (ref 0.61–1.24)
GFR, Estimated: 35 mL/min — ABNORMAL LOW (ref >60)
Glucose, Bld: 128 mg/dL — ABNORMAL HIGH (ref 70–99)
Potassium: 4.1 mmol/L (ref 3.5–5.1)
Sodium: 138 mmol/L (ref 135–145)

## 2022-03-21 LAB — RESP PANEL BY RT-PCR (FLU A&B, COVID) ARPGX2
Influenza A by PCR: POSITIVE — AB
Influenza B by PCR: NEGATIVE
SARS Coronavirus 2 by RT PCR: NEGATIVE

## 2022-03-21 MED ORDER — ENSURE ENLIVE PO LIQD: 237.0000 mL | Freq: Two times a day (BID) | ORAL | Status: DC

## 2022-03-21 MED ORDER — PREDNISONE 20 MG PO TABS
40.0000 mg | ORAL_TABLET | Freq: Every day | ORAL | Status: DC
  Administered 2022-03-21 – 2022-03-25 (×5): 40 mg via ORAL
  Filled 2022-03-21 (×7): qty 2

## 2022-03-21 MED ORDER — ATORVASTATIN CALCIUM 10 MG PO TABS
20.0000 mg | ORAL_TABLET | Freq: Every day | ORAL | Status: DC
  Administered 2022-03-21 – 2022-03-24 (×4): 20 mg via ORAL
  Filled 2022-03-21 (×4): qty 2

## 2022-03-21 MED ORDER — ASPIRIN 81 MG PO CHEW
81.0000 mg | CHEWABLE_TABLET | Freq: Every day | ORAL | Status: DC
  Administered 2022-03-21 – 2022-03-25 (×5): 81 mg via ORAL
  Filled 2022-03-21 (×5): qty 1

## 2022-03-21 MED ORDER — APIXABAN 5 MG PO TABS
5.0000 mg | ORAL_TABLET | Freq: Two times a day (BID) | ORAL | Status: DC
  Administered 2022-03-21 – 2022-03-23 (×5): 5 mg via ORAL
  Filled 2022-03-21 (×5): qty 1

## 2022-03-21 MED ORDER — ADULT MULTIVITAMIN W/MINERALS CH
1.0000 | ORAL_TABLET | Freq: Every day | ORAL | Status: DC
  Administered 2022-03-22 – 2022-03-25 (×4): 1 via ORAL
  Filled 2022-03-21 (×4): qty 1

## 2022-03-21 MED ORDER — POLYETHYLENE GLYCOL 3350 17 G PO PACK
17.0000 g | PACK | Freq: Every day | ORAL | Status: DC | PRN
  Administered 2022-03-22: 17 g via ORAL
  Filled 2022-03-21: qty 1

## 2022-03-21 MED ORDER — OSELTAMIVIR PHOSPHATE 6 MG/ML PO SUSR
30.0000 mg | Freq: Every day | ORAL | Status: DC
  Administered 2022-03-21: 30 mg via ORAL
  Filled 2022-03-21 (×2): qty 12.5

## 2022-03-21 MED ORDER — DOCUSATE SODIUM 100 MG PO CAPS
100.0000 mg | ORAL_CAPSULE | Freq: Two times a day (BID) | ORAL | Status: DC
  Administered 2022-03-21 – 2022-03-25 (×7): 100 mg via ORAL
  Filled 2022-03-21 (×9): qty 1

## 2022-03-21 NOTE — Progress Notes (Signed)
   NAME:  Brian Parsons, MRN:  [id number], DOB:  12-18-1950, LOS: 2 ADMISSION DATE:  03/19/2022 CHIEF COMPLAINT: Acute hypercapnic respiratory failure  History of Present Illness:  71 year old smoker (70 pack years) with a history of severe COPD, hypertension with chronic HFpEF, associated chronic hypoxemic respiratory failure on 2-2.5 L/min and resultant polycythemia for which he has undergone phlebotomy.  Also with diabetes, nonobstructive CAD. He developed URI symptoms about 4 days prior to admission, was treated as an outpatient with antibiotics and steroids but continued to worsen.  He developed hallucinations on the date of admission, then ultimately obtundation.  He was seen in the emergency department today pH and found to have a severe respiratory acidosis, required intubation and mechanical ventilation.  During laryngoscopy for his intubation a foreign body was noted in the posterior pharynx.  Once he was stabilized and ventilated a repeat laryngoscopy was performed but the object could not be seen, question swallowed.  He has had soft blood pressure since he was intubated, sedated. He transfers to [facility] for further care.  Pertinent  Medical History   Past Medical History:  Diagnosis Date   Bilateral leg edema    CHF (congestive heart failure) (HCC)    COPD (chronic obstructive pulmonary disease) ([place])    Coronary atherosclerosis    Nonobstructive at cardiac catheterization April 2017   Diabetes mellitus without complication ([place])    Essential hypertension    On home O2    2L N/C prn   Polycythemia     Significant Hospital Events: Including procedures, antibiotic start and stop dates in addition to other pertinent events   Repeat laryngoscopy neg, CT chest/abd neg  Interim History / Subjective:  Doing well off vent, wore BIPAP last night.  Objective   Blood pressure 127/87, pulse 64, temperature 98 F (36.7 C), temperature source Oral, resp. rate 20, height 6' (1.829 m),  weight 96.6 kg, SpO2 96 %.    Vent Mode: BIPAP;PCV FiO2 (%):  [40 %-50 %] 40 % Set Rate:  [8 bmp] 8 bmp Vt Set:  [831 mL] 620 mL PEEP:  [5 cmH20] 5 cmH20 Plateau Pressure:  [15 cmH20-16 cmH20] 16 cmH20   Intake/Output Summary (Last 24 hours) at 03/21/2022 5176 Last data filed at 03/21/2022 1607 Gross per 24 hour  Intake 2711.51 ml  Output 1650 ml  Net 1061.51 ml    Filed Weights   03/19/22 1309 03/21/22 0500  Weight: 96.3 kg 96.6 kg    Examination: No distress Lung sounds diminished Facial plethora stable (stigmata of polycythemia) Ext warm, trace edema Aox3, fair insight Down to 6LPM (home 3LPM)  Labs pending  Resolved Hospital Problem list     Assessment & Plan:  Acute on chronic hypercapneic and hypoxemic resp failure, AECOPD, influenza- improving Cor pulmonale Secondary polycythemia AKI on CKD- no obstruction on CT Shock related to sedation Pancreatic lesion- OP f/u Question of foreign body- none seen on repeat laryngoscopy or on imaging Afib- new diagnosis for him  - Gentle hydration, f/u BMP - BIPAP qHS and PRN - PT/OT - Needs OP f/u for pancreatic lesion incidentally noted on CT - LABA/LAMA/ICS neb - Steroids to PO x 7 days - Tamiflu x 5 days - Stable for transfer to progressive, appreciate TRH taking over starting 03/22/22.  Remaining issues (A) O2 wean (B) stability without BIPAP qHS (C) PT/OT eval (D) AKI improvement - Start eliquis  35 min cc time Erskine Emery MD PCCM

## 2022-03-21 NOTE — Discharge Instructions (Addendum)

## 2022-03-21 NOTE — Evaluation (Signed)
Physical Therapy Evaluation Patient Details Name: Brian Parsons MRN: [id number] DOB: 1950-06-17 Today's Date: 03/21/2022  History of Present Illness  71 yo male admitted 12/3 with URI, hallucinations and respiratory acidosis. Intubated 12/3-12/4. PMhx; COPD on 2L at home, smoker, HTN, HFpEF, DM, CAD  Clinical Impression  Pt pleasant and wanting to mobilize. Pt able to walk in hall and get up to chair. Pt with increased cardiopulmonary demand for supplemental O2 and decreased strength and functional mobility who will benefit from acute therapy to maximize mobility, safety and function to decrease burden of care. Encouraged OOB to chair throughout the day.   6L at rest 96% Drop to 86% on 8L with gait with inconsistent pleth       Recommendations for follow up therapy are one component of a multi-disciplinary discharge planning process, led by the attending physician.  Recommendations may be updated based on patient status, additional functional criteria and insurance authorization.  Follow Up Recommendations Home health PT      Assistance Recommended at Discharge Intermittent Supervision/Assistance  Patient can return home with the following  A little help with walking and/or transfers;Assistance with cooking/housework;Help with stairs or ramp for entrance    Equipment Recommendations Rolling walker (2 wheels)  Recommendations for Other Services       Functional Status Assessment Patient has had a recent decline in their functional status and demonstrates the ability to make significant improvements in function in a reasonable and predictable amount of time.     Precautions / Restrictions Precautions Precautions: Fall;Other (comment) Precaution Comments: watch sats      Mobility  Bed Mobility Overal bed mobility: Needs Assistance Bed Mobility: Supine to Sit     Supine to sit: Min assist, HOB elevated     General bed mobility comments: HOB 20 degrees, use of rail and  increased time to transition to sitting EOB    Transfers Overall transfer level: Needs assistance   Transfers: Sit to/from Stand Sit to Stand: Min guard           General transfer comment: cues for hand placement and safety    Ambulation/Gait Ambulation/Gait assistance: Min guard Gait Distance (Feet): 150 Feet Assistive device: Rolling walker (2 wheels) Gait Pattern/deviations: Step-through pattern, Decreased stride length, Trunk flexed   Gait velocity interpretation: 1.31 - 2.62 ft/sec, indicative of limited community ambulator   General Gait Details: cues for posture and proximity to RW with cues for breathing technique. Pt requiring 8L with gait with inconsistent pleth and SPO2 86%  Stairs            Wheelchair Mobility    Modified Rankin (Stroke Patients Only)       Balance Overall balance assessment: Needs assistance Sitting-balance support: No upper extremity supported Sitting balance-Leahy Scale: Fair     Standing balance support: Bilateral upper extremity supported, Reliant on assistive device for balance Standing balance-Leahy Scale: Poor                               Pertinent Vitals/Pain Pain Assessment Pain Assessment: No/denies pain    Home Living Family/patient expects to be discharged to:: Private residence Living Arrangements: Spouse/significant other Available Help at Discharge: Family Type of Home: Mobile home Home Access: Stairs to enter Entrance Stairs-Rails: Psychiatric nurse of Steps: 5   Home Layout: One level Home Equipment: Cane - single point      Prior Function Prior Level of Function : Independent/Modified Independent  Hand Dominance        Extremity/Trunk Assessment   Upper Extremity Assessment Upper Extremity Assessment: Generalized weakness    Lower Extremity Assessment Lower Extremity Assessment: Generalized weakness    Cervical / Trunk  Assessment Cervical / Trunk Assessment: Kyphotic  Communication   Communication: HOH  Cognition Arousal/Alertness: Awake/alert Behavior During Therapy: Flat affect Overall Cognitive Status: Impaired/Different from baseline Area of Impairment: Safety/judgement                         Safety/Judgement: Decreased awareness of deficits              General Comments      Exercises     Assessment/Plan    PT Assessment Patient needs continued PT services  PT Problem List Decreased mobility;Decreased activity tolerance;Decreased balance;Decreased knowledge of use of DME;Cardiopulmonary status limiting activity       PT Treatment Interventions Gait training;Therapeutic exercise;Patient/family education;Stair training;Functional mobility training;Balance training;Therapeutic activities;DME instruction    PT Goals (Current goals can be found in the Care Plan section)  Acute Rehab PT Goals Patient Stated Goal: return home, watch sports PT Goal Formulation: With patient Time For Goal Achievement: 04/04/22 Potential to Achieve Goals: Good    Frequency Min 3X/week     Co-evaluation               AM-PAC PT "6 Clicks" Mobility  Outcome Measure Help needed turning from your back to your side while in a flat bed without using bedrails?: A Little Help needed moving from lying on your back to sitting on the side of a flat bed without using bedrails?: A Little Help needed moving to and from a bed to a chair (including a wheelchair)?: A Little Help needed standing up from a chair using your arms (e.g., wheelchair or bedside chair)?: A Little Help needed to walk in hospital room?: A Little Help needed climbing 3-5 steps with a railing? : A Lot 6 Click Score: 17    End of Session Equipment Utilized During Treatment: Gait belt;Oxygen Activity Tolerance: Patient tolerated treatment well Patient left: in chair;with call bell/phone within reach;with chair alarm set Nurse  Communication: Precautions PT Visit Diagnosis: Other abnormalities of gait and mobility (R26.89);Muscle weakness (generalized) (M62.81)    Time: 4268-3419 PT Time Calculation (min) (ACUTE ONLY): 25 min   Charges:   PT Evaluation $PT Eval Moderate Complexity: 1 Mod PT Treatments $Therapeutic Activity: 8-22 mins        Bayard Males, PT Acute Rehabilitation Services Office: [phone]   Brian Parsons 03/21/2022, 9:17 AM

## 2022-03-21 NOTE — Progress Notes (Signed)
Nutrition Follow-up  DOCUMENTATION CODES:   Non-severe (moderate) malnutrition in context of chronic illness  INTERVENTION:   - Given malnutrition, liberalize diet to Regular to promote PO intake  - Ensure Enlive po BID, each supplement provides 350 kcal and 20 grams of protein  - MVI with minerals daily  NUTRITION DIAGNOSIS:   Moderate Malnutrition related to chronic illness (COPD, CHF) as evidenced by mild fat depletion, moderate muscle depletion.  Ongoing, being addressed via diet advancement and oral nutrition supplements  GOAL:   Patient will meet greater than or equal to 90% of their needs  Progressing  MONITOR:   PO intake, Supplement acceptance, Labs, Weight trends, Skin, I & O's  REASON FOR ASSESSMENT:   Ventilator, Consult Enteral/tube feeding initiation and management  ASSESSMENT:   71 year old male who presented to the ED on 12/03 with respiratory distress. PMH of severe COPD, DM, HTN, CHF, tobacco use, CAD, depression, gout, IBS, polycythemia. Pt required intubation. Pt admitted with acute on chronic hypercapnic respiratory failure, AKI on CKD, shock.  12/04 - extubated 12/05 - diet advanced to Heart Healthy  Discussed pt with RN and during ICU rounds. Acute on chronic respiratory failure secondary to influenza is improving per CCM. Attempted to speak with pt at bedside. Pt sleeping soundly and did not awaken to RD voice.  Pt with Heart Healthy diet ordered. Given malnutrition, will liberalize to Regular. Will order oral nutrition supplements to aid pt in meeting increased kcal and protein needs. Will also order daily MVI with minerals.  Medications reviewed and include: colace, tamiflu, prednisone, IV abx IVF: LR @ 75 ml/hr  Labs reviewed: BUN 87, creatinine 1.99, platelets 110 CBG's: 124-189 x 24 hours  UOP: 1650 ml x 24 hours I/O's: +1.4 L since admit  Diet Order:   Diet Order             Diet regular Room service appropriate? Yes with  Assist; Fluid consistency: Thin  Diet effective now                   EDUCATION NEEDS:   Not appropriate for education at this time  Skin:  Skin Assessment: Skin Integrity Issues: Stage I: sacrum Other: puncture wound to sacrum  Last BM:  no documented BM  Height:   Ht Readings from Last 1 Encounters:  03/19/22 6' (1.829 m)    Weight:   Wt Readings from Last 1 Encounters:  03/21/22 96.6 kg   BMI:  Body mass index is 28.88 kg/m.  Estimated Nutritional Needs:   Kcal:  2200-2400  Protein:  120-140 grams  Fluid:  >2.0 L    Gustavus Bryant, MS, RD, LDN Inpatient Clinical Dietitian Please see AMiON for contact information.

## 2022-03-21 NOTE — Progress Notes (Signed)
Patient refused BiPAP. Stated he doesn't wear BiPAP at night. Vitals stable on 6 Lpm [place].

## 2022-03-21 NOTE — TOC Initial Note (Addendum)
Transition of Care [facility]) - Initial/Assessment Note    Patient Details  Name: Brian Parsons MRN: [id number] Date of Birth: December 11, 1950  [facility]) CM/SW Contact:    Tom-Johnson, Renea Ee, RN Phone Number: 03/21/2022, 3:08 PM  Clinical Narrative:                  CM spoke with patient at bedside about needs for post hospital transition. Admitted for Acute Respiratory Failure, on 5 L O2. On IV abx. From home with Fiance. Does not have children, has a brother whom is supportive. Retired, independent and drive self prior to admission. On 3L home O2 chronic and get supplies from [employer].  PCP is [name], MD and uses [facility] in [place].  Home health PT recommended and patient requested [facility] from Medicare.gov list. Tommi Rumps noted acceptance, info on AVS.   RW ordered from [employer] and Erasmo Downer to deliver to patient at bedside. CM will continue to follow as patient progresses with care towards discharge.     Expected Discharge Plan: [place] Barriers to Discharge: Continued Medical Work up   Patient Goals and CMS Choice Patient states their goals for this hospitalization and ongoing recovery are:: To return home CMS Medicare.gov Compare Post Acute Care list provided to:: Patient Choice offered to / list presented to : Patient  Expected Discharge Plan and Services Expected Discharge Plan: [place]   Discharge Planning Services: CM Consult Post Acute Care Choice: [place] arrangements for the past 2 months: Single Family Home                 DME Arranged: Walker rolling DME Agency: [employer] Date DME Agency Contacted: 03/22/22 Time DME Agency Contacted: 1660 Representative spoke with at DME Agency: Erasmo Downer HH Arranged: PT [place]: [place] Date [place]: 03/21/22 Time [place]: 80 Representative spoke with at [place]: [place]  Arrangements/Services Living arrangements for the past 2 months: [place] with:: Significant Other Patient language and need for interpreter reviewed:: Yes Do you feel safe going back to the place where you live?: Yes      Need for Family Participation in Patient Care: Yes (Comment) Care giver support system in place?: Yes (comment) Current home services: DME (Home oxygen) Criminal Activity/Legal Involvement Pertinent to Current Situation/Hospitalization: No - Comment as needed  Activities of Daily Living      Permission Sought/Granted Permission sought to share information with : Case Manager, Customer service manager, Family Supports Permission granted to share information with : Yes, Verbal Permission Granted              Emotional Assessment Appearance:: Appears stated age Attitude/Demeanor/Rapport: Engaged, Gracious Affect (typically observed): Accepting, Appropriate, Calm, Hopeful Orientation: : Oriented to Self, Oriented to Place, Oriented to  Time, Oriented to Situation Alcohol / Substance Use: Not Applicable Psych Involvement: No (comment)  Admission diagnosis:  Shock ([place]) [R57.9] Acute respiratory failure with hypoxia (HCC) [J96.01] Acute respiratory failure with hypercapnia (HCC) [J96.02] Elevated troponin I level [R79.89] AKI (acute kidney injury) ([place]) [N17.9] Patient Active Problem List   Diagnosis Date Noted   Malnutrition of moderate degree 03/21/2022   Acute respiratory failure with hypoxia ([place]) 03/19/2022   Vitamin D deficiency 11/10/2019   Chronic respiratory failure with hypoxia and hypercapnia c/b secondary polycycthemia 10/14/2019   COPD  GOLD ? / group D symptom/ risk 10/14/2019   Type 2 diabetes  mellitus with stage 3a chronic kidney disease, without long-term current use of insulin ([place]) 03/06/2019   Mixed hyperlipidemia 03/06/2019   Class 2 severe obesity due to excess calories with serious comorbidity and body mass index  (BMI) of 36.0 to 36.9 in adult [facility]) 03/06/2019   Edema 10/01/2017   CHF, acute on chronic ([place]) 04/29/2016   AKI (acute kidney injury) ([place]) 04/29/2016   CHF exacerbation ([place]) 04/29/2016   CHF (congestive heart failure) ([place]) 02/24/2016   Exertional dyspnea    Abnormal nuclear stress test 08/04/2015   Chronic diastolic heart failure ([place]) 08/04/2015   Essential hypertension, benign 08/04/2015   Polycythemia, secondary 08/04/2015   COPD exacerbation ([place]) 08/04/2015   Cigarette smoker 08/02/2015   Acute on chronic renal insufficiency 46/28/6381   Acute diastolic CHF (congestive heart failure) ([place]) 07/28/2015   Acute on chronic respiratory failure ([place]) 08/04/2014   Hypoxia 08/03/2014   Lower extremity edema 08/03/2014   Elevated troponin 08/03/2014   Dyspnea 08/03/2014   PCP:  [name], MD Pharmacy:   [place], [place] - [address] [place] [address] Phone: [phone] Fax: [phone]     Social Determinants of Health (SDOH) Interventions Food Insecurity Interventions: Intervention Not Indicated Housing Interventions: Intervention Not Indicated Transportation Interventions: Intervention Not Indicated, Inpatient TOC, Patient Resources (Friends/Family) Utilities Interventions: Intervention Not Indicated  Readmission Risk Interventions     No data to display

## 2022-03-22 DIAGNOSIS — J9601 Acute respiratory failure with hypoxia: Secondary | ICD-10-CM | POA: Diagnosis not present

## 2022-03-22 LAB — CBC
HCT: 59.6 % — ABNORMAL HIGH (ref 39.0–52.0)
Hemoglobin: 18.5 g/dL — ABNORMAL HIGH (ref 13.0–17.0)
MCH: 29.2 pg (ref 26.0–34.0)
MCHC: 31 g/dL (ref 30.0–36.0)
MCV: 94 fL (ref 80.0–100.0)
Platelets: 120 10*3/uL — ABNORMAL LOW (ref 150–400)
RBC: 6.34 MIL/uL — ABNORMAL HIGH (ref 4.22–5.81)
RDW: 17.4 % — ABNORMAL HIGH (ref 11.5–15.5)
WBC: 9.2 10*3/uL (ref 4.0–10.5)
nRBC: 0.2 % (ref 0.0–0.2)

## 2022-03-22 LAB — BASIC METABOLIC PANEL
Anion gap: 16 — ABNORMAL HIGH (ref 5–15)
BUN: 76 mg/dL — ABNORMAL HIGH (ref 8–23)
CO2: 42 mmol/L — ABNORMAL HIGH (ref 22–32)
Calcium: 9.4 mg/dL (ref 8.9–10.3)
Chloride: 85 mmol/L — ABNORMAL LOW (ref 98–111)
Creatinine, Ser: 1.65 mg/dL — ABNORMAL HIGH (ref 0.61–1.24)
GFR, Estimated: 44 mL/min — ABNORMAL LOW (ref >60)
Glucose, Bld: 113 mg/dL — ABNORMAL HIGH (ref 70–99)
Potassium: 4.2 mmol/L (ref 3.5–5.1)
Sodium: 143 mmol/L (ref 135–145)

## 2022-03-22 MED ORDER — LINACLOTIDE 145 MCG PO CAPS
145.0000 ug | ORAL_CAPSULE | Freq: Every day | ORAL | Status: DC
  Administered 2022-03-22 – 2022-03-25 (×3): 145 ug via ORAL
  Filled 2022-03-22 (×3): qty 1

## 2022-03-22 MED ORDER — METOPROLOL SUCCINATE ER 25 MG PO TB24
12.5000 mg | ORAL_TABLET | Freq: Every day | ORAL | Status: DC
  Administered 2022-03-22 – 2022-03-25 (×4): 12.5 mg via ORAL
  Filled 2022-03-22 (×4): qty 1

## 2022-03-22 MED ORDER — TORSEMIDE 20 MG PO TABS
80.0000 mg | ORAL_TABLET | Freq: Two times a day (BID) | ORAL | Status: AC
  Administered 2022-03-23 (×2): 80 mg via ORAL
  Filled 2022-03-22 (×2): qty 4

## 2022-03-22 MED ORDER — OSELTAMIVIR PHOSPHATE 30 MG PO CAPS
30.0000 mg | ORAL_CAPSULE | Freq: Every day | ORAL | Status: DC
  Administered 2022-03-22: 30 mg via ORAL
  Filled 2022-03-22 (×2): qty 1

## 2022-03-22 NOTE — Progress Notes (Addendum)
[facility] PROGRESS NOTE  Bing Duffey (DOB: 02/22/1951) JME:[id number] PCP: [name], MD  Brief Narrative: Brian Parsons is a 71 y.o. male with a history of 80 pack year tobacco use, severe 2-3L O2-dependent COPD, secondary polycythemia treated with phlebotomy, chronic HFpEF, HTN, T2DM, nonobstructive CAD who presented to the ED on 03/19/2022 at [facility] with URI symptoms preceding AMS, obtundation and respiratory acidosis requiring intubation and ventilation, transferred to [facility] ICU. He was found to be positive for influenza and treated for AECOPD, AKI, and started on anticoagulation for possible new diagnosis of atrial fibrillation. Ultimately weaned to BiPAP and transferred to medical floor on hospitalist service 12/6.  Subjective: Still weaning from oxygen, requiring 8L to maintain saturations, even still with significant dyspnea on exertion, but feeling better. Breathing improved, no chest pain or other complaints.   Objective: BP 138/78 (BP Location: Left Arm)   Pulse 78   Temp 97.8 F (36.6 C) (Oral)   Resp 18   Ht 6' (1.829 m)   Wt 88.4 kg   SpO2 96%   BMI 26.43 kg/m   Gen: Chronically ill-appearing older male in no distress Pulm: Diminished with expiratory wheezing, no crackles at bases.   CV: RRR, no MRG or pitting edema GI: Soft, NT, ND, +BS  Neuro: Alert and oriented. No new focal deficits. Ext: Warm, no deformities Skin: No rashes, lesions or ulcers on visualized skin   Assessment & Plan: Acute on chronic hypoxic respiratory failure: Due to AECOPD due to influenza infection:  - Wean oxygen as tolerated to baseline 2-3LPM. Keep in PCU until off BiPAP durably.  - Treat conditions below  AECOPD:  - Persistent wheezing today. Continue steroids likely with longer taper, continue scheduled and prn nebs. Completed azithromycin x3 days. - Cardioselective BB as below  Influenza:  - Complete tamiflu x5 days renally dosed (12/5 - 12/9)  AKI on stage IIIb CKD: Resolved  AKI.  - Can stop IVF, taking better po  MAT: On my review of ECGs, there appears to be polymorphic p waves at times tachycardic, at times normal rate. Cardiology [facility]) as outpatient notes zio patch showed MAT. I don't see definite atrial fibrillation. Now in NSR.  - Continue new eliquis for now until cardiology weighs in. - Start lowest dose metoprolol (home coreg less favored with bronchospasm)  New HFrEF, chronic HFpEF, cor pulmonale, nonobstructive CAD: Echo this admit with poor windows on vent, septal flattening consistent with RV volume/pressure overload, and mod-severely reduced LVEF, global hypokinesis.  - No RWMA, though significant RF for obstructive disease, now new depressed LVEF. Would appreciate cardiology assessment.  - Add metoprolol succinate. - AKI/CKD currently limiting ACE/ARB/ARNI/spironolactone, though creatinine is improving. - Stop IVF, will plan to restart torsemide '80mg'$  po BID tomorrow - Continue ASA, statin.   Secondary polycythemia: Due to chronic hypoxia. No Tx indicated at this time.  Thrombocytopenia: Monitor, mild without bleeding.   Shock related to sedation: Resolved.   Question of foreign body on laryngoscopy: Not seen on reevaluation or imaging.   Moderate protein calorie malnutrition:  - Supp protein  Pancreatic nodule: 1m low attenuation on NCCT.  - Consider outpatient MRI with and without contrast.   RPatrecia Pour MD [facility] [email or web address] 03/22/2022, 12:51 PM

## 2022-03-22 NOTE — Plan of Care (Signed)
  Problem: Clinical Measurements: Goal: Respiratory complications will improve Outcome: Progressing   Problem: Activity: Goal: Risk for activity intolerance will decrease Outcome: Progressing   Problem: Activity: Goal: Ability to tolerate increased activity will improve Outcome: Progressing

## 2022-03-22 NOTE — Progress Notes (Signed)
Mobility Specialist Progress Note    03/22/22 0903  Mobility  Activity Ambulated with assistance in hallway  Level of Assistance Contact guard assist, steadying assist  Assistive Device Front wheel walker  Distance Ambulated (ft) 315 ft  Activity Response Tolerated well  Mobility Referral Yes  $Mobility charge 1 Mobility   Pre-Mobility: 80 HR, 87% SpO2  During Mobility: 107 HR, >/=86% SpO2 on 6-8LO2 Post-Mobility: 93 HR, 119/97 (106) BP, 89% SpO2  Pt received in bed and agreeable. No complaints. On 6LO2 and pleth reading 86% so increased to 8LO2 and able to maintain pleth of >/=90%. Returned to chair with call bell in reach.   Hildred Alamin Mobility Specialist  Please Psychologist, sport and exercise or Rehab Office at [phone]

## 2022-03-23 ENCOUNTER — Encounter (HOSPITAL_COMMUNITY): Payer: Self-pay | Admitting: Emergency Medicine

## 2022-03-23 DIAGNOSIS — R600 Localized edema: Secondary | ICD-10-CM

## 2022-03-23 DIAGNOSIS — R609 Edema, unspecified: Secondary | ICD-10-CM

## 2022-03-23 DIAGNOSIS — I498 Other specified cardiac arrhythmias: Secondary | ICD-10-CM | POA: Diagnosis not present

## 2022-03-23 DIAGNOSIS — R9431 Abnormal electrocardiogram [ECG] [EKG]: Secondary | ICD-10-CM

## 2022-03-23 DIAGNOSIS — I50812 Chronic right heart failure: Secondary | ICD-10-CM

## 2022-03-23 DIAGNOSIS — J9601 Acute respiratory failure with hypoxia: Secondary | ICD-10-CM | POA: Diagnosis not present

## 2022-03-23 LAB — CBC
HCT: 57.1 % — ABNORMAL HIGH (ref 39.0–52.0)
Hemoglobin: 17.2 g/dL — ABNORMAL HIGH (ref 13.0–17.0)
MCH: 29.3 pg (ref 26.0–34.0)
MCHC: 30.1 g/dL (ref 30.0–36.0)
MCV: 97.1 fL (ref 80.0–100.0)
Platelets: 106 10*3/uL — ABNORMAL LOW (ref 150–400)
RBC: 5.88 MIL/uL — ABNORMAL HIGH (ref 4.22–5.81)
RDW: 16.3 % — ABNORMAL HIGH (ref 11.5–15.5)
WBC: 7.9 10*3/uL (ref 4.0–10.5)
nRBC: 0.3 % — ABNORMAL HIGH (ref 0.0–0.2)

## 2022-03-23 LAB — BASIC METABOLIC PANEL
Anion gap: 8 (ref 5–15)
BUN: 62 mg/dL — ABNORMAL HIGH (ref 8–23)
CO2: 45 mmol/L — ABNORMAL HIGH (ref 22–32)
Calcium: 9.4 mg/dL (ref 8.9–10.3)
Chloride: 89 mmol/L — ABNORMAL LOW (ref 98–111)
Creatinine, Ser: 1.43 mg/dL — ABNORMAL HIGH (ref 0.61–1.24)
GFR, Estimated: 52 mL/min — ABNORMAL LOW (ref >60)
Glucose, Bld: 120 mg/dL — ABNORMAL HIGH (ref 70–99)
Potassium: 4.9 mmol/L (ref 3.5–5.1)
Sodium: 142 mmol/L (ref 135–145)

## 2022-03-23 LAB — GLUCOSE, CAPILLARY: Glucose-Capillary: 169 mg/dL — ABNORMAL HIGH (ref 70–99)

## 2022-03-23 MED ORDER — METOLAZONE 2.5 MG PO TABS
2.5000 mg | ORAL_TABLET | ORAL | Status: DC
  Filled 2022-03-23 (×2): qty 1

## 2022-03-23 MED ORDER — MAGNESIUM SULFATE 2 GM/50ML IV SOLN
2.0000 g | Freq: Once | INTRAVENOUS | Status: AC
  Administered 2022-03-23: 2 g via INTRAVENOUS
  Filled 2022-03-23: qty 50

## 2022-03-23 MED ORDER — ALLOPURINOL 100 MG PO TABS
200.0000 mg | ORAL_TABLET | Freq: Two times a day (BID) | ORAL | Status: DC
  Administered 2022-03-23 – 2022-03-25 (×5): 200 mg via ORAL
  Filled 2022-03-23 (×5): qty 2

## 2022-03-23 MED ORDER — SODIUM CHLORIDE 0.9 % IV SOLN: INTRAVENOUS | Status: DC

## 2022-03-23 MED ORDER — OSELTAMIVIR PHOSPHATE 30 MG PO CAPS
30.0000 mg | ORAL_CAPSULE | Freq: Two times a day (BID) | ORAL | Status: DC
  Administered 2022-03-23 – 2022-03-25 (×5): 30 mg via ORAL
  Filled 2022-03-23 (×6): qty 1

## 2022-03-23 MED ORDER — ESCITALOPRAM OXALATE 10 MG PO TABS
20.0000 mg | ORAL_TABLET | Freq: Every day | ORAL | Status: DC
  Administered 2022-03-23 – 2022-03-25 (×3): 20 mg via ORAL
  Filled 2022-03-23 (×3): qty 2

## 2022-03-23 MED ORDER — INSULIN ASPART 100 UNIT/ML IJ SOLN: 0.0000 [IU] | Freq: Every day | INTRAMUSCULAR | Status: DC

## 2022-03-23 MED ORDER — DULOXETINE HCL 30 MG PO CPEP
30.0000 mg | ORAL_CAPSULE | Freq: Every day | ORAL | Status: DC
  Administered 2022-03-23 – 2022-03-25 (×3): 30 mg via ORAL
  Filled 2022-03-23 (×3): qty 1

## 2022-03-23 MED ORDER — TORSEMIDE 20 MG PO TABS
80.0000 mg | ORAL_TABLET | Freq: Two times a day (BID) | ORAL | Status: DC
  Administered 2022-03-24 – 2022-03-25 (×2): 80 mg via ORAL
  Filled 2022-03-23 (×2): qty 4

## 2022-03-23 MED ORDER — METOLAZONE 2.5 MG PO TABS
2.5000 mg | ORAL_TABLET | ORAL | Status: DC
  Administered 2022-03-25: 2.5 mg via ORAL
  Filled 2022-03-23 (×2): qty 1

## 2022-03-23 MED ORDER — INSULIN ASPART 100 UNIT/ML IJ SOLN
0.0000 [IU] | Freq: Three times a day (TID) | INTRAMUSCULAR | Status: DC
  Administered 2022-03-24: 3 [IU] via SUBCUTANEOUS
  Administered 2022-03-25: 1 [IU] via SUBCUTANEOUS
  Administered 2022-03-25: 2 [IU] via SUBCUTANEOUS

## 2022-03-23 NOTE — Progress Notes (Signed)
Physical Therapy Treatment Patient Details Name: Brian Parsons MRN: [id number] DOB: 12/08/1950 Today's Date: 03/23/2022   History of Present Illness Pt is a 71 y.o. male admitted 03/19/22 with URI, hallucinations, respiratory acidosis. ETT 12/3-12/4. Workup for influenza, COPD, AKI. PMH includes COPD (2 -3L O2 baseline), HTN, HFpEF, DM, CAD, tobacco use.   PT Comments    Pt progressing with mobility. Pt able to perform standing ADL tasks at sink and ambulate with RW at supervision-level; pt remains limited by decreased activity tolerance, DOE 3/4 (see SpO2 values below). Will continue to follow acutely to address established goals.  SpO2 >/92% on 6L O2 HFNC when pleth reliable ambulating SpO2 93% on 5L O2 at rest at end of session    Recommendations for follow up therapy are one component of a multi-disciplinary discharge planning process, led by the attending physician.  Recommendations may be updated based on patient status, additional functional criteria and insurance authorization.  Follow Up Recommendations  Home health PT     Assistance Recommended at Discharge Intermittent Supervision/Assistance  Patient can return home with the following Assistance with cooking/housework;Help with stairs or ramp for entrance;A little help with bathing/dressing/bathroom   Equipment Recommendations  Rolling walker (2 wheels)    Recommendations for Other Services       Precautions / Restrictions Precautions Precautions: Fall;Other (comment) Precaution Comments: Watch SpO2 (wears 2-3L O2 baseline) Restrictions Weight Bearing Restrictions: No     Mobility  Bed Mobility Overal bed mobility: Modified Independent Bed Mobility: Supine to Sit                Transfers Overall transfer level: Modified independent Equipment used: Rolling walker (2 wheels) Transfers: Sit to/from Stand                  Ambulation/Gait Ambulation/Gait assistance: Supervision, Min guard Gait  Distance (Feet): 108 Feet Assistive device: Rolling walker (2 wheels) Gait Pattern/deviations: Step-through pattern, Decreased stride length, Trunk flexed Gait velocity: Decreased     General Gait Details: slow, mostly steady gait with RW and supervision for safety/lines; 1x self-corrected instability with turns, cues for shorter steps with turning. distance limited by fatigue after standing ADL tasks in room   Stairs             Wheelchair Mobility    Modified Rankin (Stroke Patients Only)       Balance Overall balance assessment: Needs assistance Sitting-balance support: No upper extremity supported Sitting balance-Leahy Scale: Good       Standing balance-Leahy Scale: Fair Standing balance comment: can stand at sink to wash hands and face, preference to lean against sink                            Cognition Arousal/Alertness: Awake/alert Behavior During Therapy: Flat affect Overall Cognitive Status: Within Functional Limits for tasks assessed                                 General Comments: WFL for simple tasks, not formally assessed; suspect near baseline cognition        Exercises      General Comments General comments (skin integrity, edema, etc.): SpO2 97% on 8L O2 HFNC, maintaining >/92% on 6L O2 [place] when pleth reliable with ambulation. SpO2 95% on 5L O2 at rest at end of session      Pertinent Vitals/Pain Pain Assessment Pain Assessment: No/denies  pain    Home Living                          Prior Function            PT Goals (current goals can now be found in the care plan section) Progress towards PT goals: Progressing toward goals    Frequency    Min 3X/week      PT Plan Current plan remains appropriate    Co-evaluation              AM-PAC PT "6 Clicks" Mobility   Outcome Measure  Help needed turning from your back to your side while in a flat bed without using bedrails?: A  Little Help needed moving from lying on your back to sitting on the side of a flat bed without using bedrails?: A Little Help needed moving to and from a bed to a chair (including a wheelchair)?: A Little Help needed standing up from a chair using your arms (e.g., wheelchair or bedside chair)?: A Little Help needed to walk in hospital room?: A Little Help needed climbing 3-5 steps with a railing? : A Lot 6 Click Score: 17    End of Session Equipment Utilized During Treatment: Gait belt;Oxygen Activity Tolerance: Patient tolerated treatment well Patient left: in chair;with call bell/phone within reach Nurse Communication: Mobility status PT Visit Diagnosis: Other abnormalities of gait and mobility (R26.89);Muscle weakness (generalized) (M62.81)     Time: 5916-3846 PT Time Calculation (min) (ACUTE ONLY): 22 min  Charges:  $Therapeutic Activity: 8-22 mins                     Mabeline Caras, PT, DPT Acute Rehabilitation Services  Personal: [facility] Office: [place] 03/23/2022, 11:40 AM

## 2022-03-23 NOTE — Progress Notes (Signed)
Mobility Specialist Progress Note    03/23/22 1414  Mobility  Activity Ambulated with assistance in hallway  Level of Assistance Contact guard assist, steadying assist  Assistive Device Front wheel walker  Distance Ambulated (ft) 280 ft  Activity Response Tolerated well  Mobility Referral Yes  $Mobility charge 1 Mobility   Pre-Mobility: 77 HR, 100% SpO2 on 4LO2 Post-Mobility: 75 HR, 98% SpO2 on 4LO2  Pt received in chair and agreeable. No complaints. On 3LO2 SpO2 as low as 86%. SpO2 >/=90% on 4LO2. Encouraged pursed lip breathing. Returned to chair with call bell in reach. RN notified.   Hildred Alamin Mobility Specialist  Please Psychologist, sport and exercise or Rehab Office at [phone]

## 2022-03-23 NOTE — Consult Note (Signed)
Cardiology Consultation   Patient ID: Brian Parsons MRN: [id number]; DOB: 1951/02/13  Admit date: 03/19/2022 Date of Consult: 03/23/2022  PCP:  [name], [place] [facility] Cardiologist:  Domenic Polite '17   Patient Profile:   Brian Parsons is a 71 y.o. male with a hx of HFpEF, nonobstructive CAD, hypertension, diabetes, COPD, pulmonary hypertension, O2 dependent, polycythemia who is being seen 03/23/2022 for the evaluation of CHF at the request of Dr. Bonner Puna.  History of Present Illness:   Brian Parsons is a 71 year old male with past medical history noted above.  Previously followed by [facility] heart care and Dr. Domenic Polite.  Catheterization 07/2015 showed mild CAD, LVEDP of 15 mmHg with global preserved LV function of 55%.  Last office visit 08/2015 noting he was 6 pounds above dry weight with lower extremity edema.  He was given metolazone 2.5 mg x 1 along with potassium supplement with plans for follow-up blood work.  Most recently he has been following with cardiology through [facility].  Over the years has had several hospitalizations for volume overload since establishing care with [facility].  Echocardiogram 04/2020 with normal LVEF with RV/RA dilation and decreased systolic function.  Last office visit 12/2021.  Recent ZIO monitor with predominantly sinus rhythm, SVT which was suspected to be MAT with the longest run of 15 seconds.  He was continued on Coreg 3.125 mg twice daily.  He was continued on torsemide 80 mg twice daily as well as metolazone 2.5 mg every other day with potassium supplement, also atorvastatin 20 mg every other day with aspirin 80 mg daily.  Presented to the [facility] ED on 12/3 with complaints of respiratory distress.  Reported he had developed URI symptoms 4 days prior to presentation and was treated as an outpatient with antibiotics and steroids but continue to worsen.  He also developed hallucinations on the date of admission and was found to be obtunded.  He was  found to be in severe respiratory acidosis and required intubation and mechanical ventilation in the ED.  Notes indicate during laryngoscopy for his intubation the foreign body was noted in the posterior pharynx but on repeat imaging object could not be seen.  He was transferred to [facility] for further management.  He was found to be positive for influenza A on admission.  CT chest with by basilar linear atelectasis concerning for pneumonia as well as 14 mm low attenuating nodule arising from the distal body of the pancreas recommendations for MRI outpatient.  CT head negative for acute process.  He was extubated 12/4 to BiPAP.  Echo 12/4 with poor acoustic windows, LV function moderate to severely depressed with diffuse hypokinesis, septal flattening consistent with RV volume/pressure overload, LVH, RV function severely reduced and moderate RV enlargement, moderately dilated right atrium, mild MR.   There was question of possible atrial fibrillation and he was placed on Eliquis 12/5.   Cardiology now asked to evaluate.  In talking with the patient, he denies any chest pain PTA. Normally wears [place] '@3L'$  and able to do most household ADLs independently without significant dyspnea. Reports compliance with medications including torsemide and metolazone PTA.   In review of telemetry since arrival to [facility] no clear evidence of atrial fibrillation. Prior telemetry while in the ICU not viewable currently.    Past Medical History:  Diagnosis Date   Bilateral leg edema    CHF (congestive heart failure) (HCC)    COPD (chronic obstructive pulmonary disease) (HCC)    Coronary atherosclerosis  Nonobstructive at cardiac catheterization April 2017   Diabetes mellitus without complication ([place])    Essential hypertension    On home O2    2L N/C prn   Polycythemia     Past Surgical History:  Procedure Laterality Date   CARDIAC CATHETERIZATION N/A 08/10/2015   Procedure: Left Heart Cath and Coronary Angiography;   Surgeon: Troy Sine, MD;  Location: [place] CV LAB;  Service: Cardiovascular;  Laterality: N/A;     Home Medications:  Prior to Admission medications   Medication Sig Start Date End Date Taking? Authorizing Provider  albuterol (ACCUNEB) 0.63 MG/3ML nebulizer solution Take 3 mLs (0.63 mg total) by nebulization every 4 (four) hours as needed. 10/14/19   Tanda Rockers, MD  albuterol (VENTOLIN HFA) 108 (90 Base) MCG/ACT inhaler Inhale 2 puffs into the lungs every 6 (six) hours as needed for wheezing or shortness of breath.    [provider]  allopurinol (ZYLOPRIM) 100 MG tablet Take 200 mg by mouth 2 (two) times daily.  08/06/17   [provider]  aspirin EC 81 MG tablet Take 81 mg by mouth daily.    [provider]  atorvastatin (LIPITOR) 20 MG tablet Take 20 mg by mouth daily at 6 PM.  03/01/17 05/17/20  [provider]  azelastine (ASTELIN) 0.1 % nasal spray 1 puff in each nostril 07/19/20   [provider]  Blood Glucose Monitoring Suppl (TRUE METRIX METER) DEVI See admin instructions. 07/07/19   [provider]  carvedilol (COREG) 3.125 MG tablet Take 1 tablet (3.125 mg total) by mouth 2 (two) times daily with a meal. 10/03/17 10/14/19  Johnson, Clanford L, MD  DULoxetine (CYMBALTA) 30 MG capsule Take 30 mg by mouth daily. 10/31/21   [provider]  escitalopram (LEXAPRO) 20 MG tablet Take 20 mg by mouth daily.    [provider]  fluticasone Asencion Islam) 50 MCG/ACT nasal spray 1 spray in each nostril 07/19/20   [provider]  Fluticasone-Umeclidin-Vilant (TRELEGY ELLIPTA) 100-62.5-25 MCG/ACT AEPB One click each am 95/63/87   Tanda Rockers, MD  ipratropium-albuterol (DUONEB) 0.5-2.5 (3) MG/3ML SOLN Take by nebulization. 04/18/21   [provider]  LINZESS 145 MCG CAPS capsule Take 145 mcg by mouth daily. 09/08/21   [provider]  Magnesium Gluconate 500 (27 Mg) MG TABS Take 500 mg by mouth in the  morning and at bedtime.     [provider]  metolazone (ZAROXOLYN) 2.5 MG tablet Take 2.5 mg by mouth every other day.  10/01/18   [provider]  potassium chloride (MICRO-K) 10 MEQ CR capsule TAKE 1 CAPSULE BY MOUTH ONCE DAILY WITH FOOD    [provider]  sildenafil (VIAGRA) 50 MG tablet Take by mouth. 04/18/21   [provider]  torsemide (DEMADEX) 20 MG tablet Take 80 mg by mouth 2 (two) times daily.     [provider]  vitamin B-12 (CYANOCOBALAMIN) 1000 MCG tablet Take 1,000 mcg by mouth daily.    [provider]    Inpatient Medications: Scheduled Meds:  allopurinol  200 mg Oral BID   apixaban  5 mg Oral BID   arformoterol  15 mcg Nebulization BID   aspirin  81 mg Oral Daily   atorvastatin  20 mg Oral Daily   budesonide (PULMICORT) nebulizer solution  0.5 mg Nebulization BID   Chlorhexidine Gluconate Cloth  6 each Topical Daily   docusate sodium  100 mg Oral BID   DULoxetine  30 mg Oral Daily   escitalopram  20 mg Oral Daily   feeding supplement  237 mL Oral BID BM   linaclotide  145 mcg Oral Daily   metoprolol succinate  12.5 mg Oral Daily   multivitamin with minerals  1 tablet Oral Daily   oseltamivir  30 mg Oral BID   predniSONE  40 mg Oral Q breakfast   revefenacin  175 mcg Nebulization Daily   torsemide  80 mg Oral BID   Continuous Infusions:  sodium chloride Stopped (03/19/22 2025)   PRN Meds: phenol, polyethylene glycol  Allergies:    Allergies  Allergen Reactions   Amoxicillin Hives    Other reaction(s): rash   Other     Other reaction(s): Unknown   Penicillin G     Other reaction(s): rash   Penicillins Hives    Has patient had a PCN reaction causing immediate rash, facial/tongue/throat swelling, SOB or lightheadedness with hypotension: Unknown Has patient had a PCN reaction causing severe rash involving mucus membranes or skin necrosis: No Has patient had a PCN reaction that required hospitalization  No Has patient had a PCN reaction occurring within the last 10 years: No If all of the above answers are "NO", then may proceed with Cephalosporin use.     Social History:   Social History   Socioeconomic History   Marital status: Divorced    Spouse name: Not on file   Number of children: Not on file   Years of education: Not on file   Highest education level: Not on file  Occupational History   Not on file  Tobacco Use   Smoking status: Every Day    Packs/day: 1.00    Years: 50.00    Total pack years: 50.00    Types: Cigarettes    Start date: 04/17/1961   Smokeless tobacco: Never  Vaping Use   Vaping Use: Never used  Substance and Sexual Activity   Alcohol use: No    Alcohol/week: 0.0 standard drinks of alcohol    Comment: No EtOH for 7 years   Drug use: No   Sexual activity: Yes  Other Topics Concern   Not on file  Social History Narrative   Not on file   Social Determinants of Health   Financial Resource Strain: Not on file  Food Insecurity: No Food Insecurity (03/21/2022)   Hunger Vital Sign    Worried About Running Out of Food in the Last Year: Never true    [place] in the Last Year: Never true  Transportation Needs: No Transportation Needs (03/21/2022)   PRAPARE - Hydrologist (Medical): No    Lack of Transportation (Non-Medical): No  Physical Activity: Not on file  Stress: Not on file  Social Connections: Not on file  Intimate Partner Violence: Not At Risk (03/21/2022)   Humiliation, Afraid, Rape, and Kick questionnaire    Fear of Current or Ex-Partner: No    Emotionally Abused: No    Physically Abused: No    Sexually Abused: No    Family History:    Family History  Problem Relation Age of Onset   Stroke Mother    Emphysema Father      ROS:  Please see the history of present illness.   All other ROS reviewed and negative.     Physical Exam/Data:   Vitals:   03/23/22 0851 03/23/22 0855 03/23/22 0856  03/23/22 1220  BP:    121/82  Pulse:  78   76  Resp: 19   15  Temp:    97.8 F (36.6 C)  TempSrc:    Oral  SpO2: 98% 98% 98% 97%  Weight:      Height:        Intake/Output Summary (Last 24 hours) at 03/23/2022 1347 Last data filed at 03/23/2022 1226 Gross per 24 hour  Intake 360 ml  Output 5100 ml  Net -4740 ml      03/23/2022    3:23 AM 03/22/2022    4:18 AM 03/21/2022    5:00 AM  Last 3 Weights  Weight (lbs) 192 lb 14.4 oz 194 lb 14.2 oz 212 lb 15.4 oz  Weight (kg) 87.5 kg 88.4 kg 96.6 kg     Body mass index is 26.16 kg/m.  General:  Well nourished, well developed, in no acute distress. Wearing [place] HEENT: normal Neck: no JVD Vascular: No carotid bruits; Distal pulses 2+ bilaterally Cardiac:  normal S1, S2; RRR; soft systolic murmur  Lungs:  mild expiratory wheezing Abd: soft, nontender, no hepatomegaly  Ext: 1-2+ bilateral LE edema Musculoskeletal:  No deformities, BUE and BLE strength normal and equal Skin: warm and dry  Neuro:  CNs 2-12 intact, no focal abnormalities noted Psych:  Normal affect   EKG:  The EKG was personally reviewed and demonstrates:   12/3 1301 sinus tachycardia, 100 bpm RBBB, PACs  12/3 2135 sinus rhythm, 72 bpm RBBB, prolonged QT, deep TWI in inferolateral leads Telemetry:  Telemetry was personally reviewed and demonstrates:  Sinus Rhythm, PACs  Relevant CV Studies:  Echo: 03/20/2022  IMPRESSIONS     1. Extremely poor acoustic windows. Definity used. LV function appears  moderate to severely depressed with diffuse hypokinesis. Septal flattening  consistent with RV volume / pressure overload. . There is mild concentric  left ventricular hypertrophy.   2. Right ventricular systolic function is severely reduced. The right  ventricular size is moderately enlarged. Mildly increased right  ventricular wall thickness.   3. Right atrial size was moderately dilated.   4. MV leaflets thickened, with myxomatous appearance . Mild mitral valve   regurgitation.   5. The aortic valve is tricuspid. Aortic valve regurgitation is not  visualized. Aortic valve sclerosis/calcification is present, without any  evidence of aortic stenosis.   6. The inferior vena cava is normal in size with <50% respiratory  variability, suggesting right atrial pressure of 8 mmHg.   FINDINGS   Left Ventricle: Extremely poor acoustic windows. Definity used. LV  function appears moderate to severely depressed with diffuse hypokinesis.  Septal flattening consistent with RV volume / pressure overload. Definity  contrast agent was given IV to  delineate the left ventricular endocardial borders. The left ventricular  internal cavity size was normal in size. There is mild concentric left  ventricular hypertrophy.   Right Ventricle: The right ventricular size is moderately enlarged. Mildly  increased right ventricular wall thickness. Right ventricular systolic  function is severely reduced.   Left Atrium: Left atrial size was normal in size.   Right Atrium: Right atrial size was moderately dilated.   Pericardium: There is no evidence of pericardial effusion.   Mitral Valve: MV leaflets thickened, with myxomatous appearance. There is  mild thickening of the mitral valve leaflet(s). Mild mitral valve  regurgitation.   Tricuspid Valve: The tricuspid valve is normal in structure. Tricuspid  valve regurgitation is trivial.   Aortic Valve: The aortic valve is tricuspid. Aortic valve regurgitation is  not visualized. Aortic  valve sclerosis/calcification is present, without  any evidence of aortic stenosis. Aortic valve mean gradient measures 3.0  mmHg. Aortic valve peak gradient  measures 5.6 mmHg. Aortic valve area, by VTI measures 1.61 cm.   Pulmonic Valve: The pulmonic valve was not well visualized. Pulmonic valve  regurgitation is not visualized.   Aorta: The aortic root is normal in size and structure.   Venous: The inferior vena cava is normal in  size with less than 50%  respiratory variability, suggesting right atrial pressure of 8 mmHg.   IAS/Shunts: No atrial level shunt detected by color flow Doppler.   Laboratory Data:  High Sensitivity Troponin:   Recent Labs  Lab 03/19/22 1310 03/19/22 1531 03/20/22 0556  TROPONINIHS 397* 358* 203*     Chemistry Recent Labs  Lab 03/21/22 0711 03/22/22 0115 03/23/22 0032  NA 138 143 142  K 4.1 4.2 4.9  CL 87* 85* 89*  CO2 41* 42* 45*  GLUCOSE 128* 113* 120*  BUN 87* 76* 62*  CREATININE 1.99* 1.65* 1.43*  CALCIUM 8.8* 9.4 9.4  GFRNONAA 35* 44* 52*  ANIONGAP 10 16* 8    Recent Labs  Lab 03/19/22 1310  PROT 7.2  ALBUMIN 3.7  AST 124*  ALT 141*  ALKPHOS 83  BILITOT 1.1   Lipids  Recent Labs  Lab 03/20/22 0052  TRIG 241*    Hematology Recent Labs  Lab 03/21/22 0711 03/22/22 0019 03/23/22 0032  WBC 9.0 9.2 7.9  RBC 6.02* 6.34* 5.88*  HGB 17.3* 18.5* 17.2*  HCT 56.2* 59.6* 57.1*  MCV 93.4 94.0 97.1  MCH 28.7 29.2 29.3  MCHC 30.8 31.0 30.1  RDW 16.8* 17.4* 16.3*  PLT 110* 120* 106*   Thyroid No results for input(s): "TSH", "FREET4" in the last 168 hours.  BNP Recent Labs  Lab 03/19/22 1310  BNP 1,196.0*    DDimer No results for input(s): "DDIMER" in the last 168 hours.   Radiology/Studies:  ECHOCARDIOGRAM COMPLETE  Result Date: 03/20/2022    ECHOCARDIOGRAM REPORT   Patient Name:   CASHAWN YANKO Date of Exam: 03/20/2022 Medical Rec #:  [id number]     Height:       72.0 in Accession #:    [id number]    Weight:       212.3 lb Date of Birth:  July 12, 1950     BSA:          2.185 m Patient Age:    79 years      BP:           121/61 mmHg Patient Gender: M             HR:           85 bpm. Exam Location:  Inpatient Procedure: 2D Echo, Cardiac Doppler, Color Doppler and Intracardiac            Opacification Agent Indications:    Elevated troponin  History:        Patient has prior history of Echocardiogram examinations, most                 recent 04/30/2016. CAD;  Risk Factors:Current Smoker and Diabetes.                 HFpEF.  Sonographer:    Clayton Lefort RDCS (AE) Referring Phys: [id number] Royanne Foots [facility]  Sonographer Comments: Technically challenging study due to limited acoustic windows, Technically difficult study due to poor echo windows, suboptimal parasternal window, suboptimal apical window, suboptimal  subcostal window and echo performed with patient supine and on artificial respirator. Image acquisition challenging due to COPD. IMPRESSIONS  1. Extremely poor acoustic windows. Definity used. LV function appears moderate to severely depressed with diffuse hypokinesis. Septal flattening consistent with RV volume / pressure overload. . There is mild concentric left ventricular hypertrophy.  2. Right ventricular systolic function is severely reduced. The right ventricular size is moderately enlarged. Mildly increased right ventricular wall thickness.  3. Right atrial size was moderately dilated.  4. MV leaflets thickened, with myxomatous appearance . Mild mitral valve regurgitation.  5. The aortic valve is tricuspid. Aortic valve regurgitation is not visualized. Aortic valve sclerosis/calcification is present, without any evidence of aortic stenosis.  6. The inferior vena cava is normal in size with <50% respiratory variability, suggesting right atrial pressure of 8 mmHg. FINDINGS  Left Ventricle: Extremely poor acoustic windows. Definity used. LV function appears moderate to severely depressed with diffuse hypokinesis. Septal flattening consistent with RV volume / pressure overload. Definity contrast agent was given IV to delineate the left ventricular endocardial borders. The left ventricular internal cavity size was normal in size. There is mild concentric left ventricular hypertrophy. Right Ventricle: The right ventricular size is moderately enlarged. Mildly increased right ventricular wall thickness. Right ventricular systolic function is severely reduced. Left Atrium:  Left atrial size was normal in size. Right Atrium: Right atrial size was moderately dilated. Pericardium: There is no evidence of pericardial effusion. Mitral Valve: MV leaflets thickened, with myxomatous appearance. There is mild thickening of the mitral valve leaflet(s). Mild mitral valve regurgitation. Tricuspid Valve: The tricuspid valve is normal in structure. Tricuspid valve regurgitation is trivial. Aortic Valve: The aortic valve is tricuspid. Aortic valve regurgitation is not visualized. Aortic valve sclerosis/calcification is present, without any evidence of aortic stenosis. Aortic valve mean gradient measures 3.0 mmHg. Aortic valve peak gradient measures 5.6 mmHg. Aortic valve area, by VTI measures 1.61 cm. Pulmonic Valve: The pulmonic valve was not well visualized. Pulmonic valve regurgitation is not visualized. Aorta: The aortic root is normal in size and structure. Venous: The inferior vena cava is normal in size with less than 50% respiratory variability, suggesting right atrial pressure of 8 mmHg. IAS/Shunts: No atrial level shunt detected by color flow Doppler.  LEFT VENTRICLE PLAX 2D LVIDd:         4.50 cm LVIDs:         3.70 cm LV PW:         1.30 cm LV IVS:        1.20 cm LVOT diam:     2.00 cm LV SV:         35 LV SV Index:   16 LVOT Area:     3.14 cm  RIGHT VENTRICLE            IVC RV Basal diam:  4.50 cm    IVC diam: 1.80 cm RV Mid diam:    4.50 cm RV S prime:     8.05 cm/s TAPSE (M-mode): 2.1 cm LEFT ATRIUM           Index        RIGHT ATRIUM           Index LA diam:      3.10 cm 1.42 cm/m   RA Area:     27.10 cm LA Vol (A4C): 35.0 ml 16.02 ml/m  RA Volume:   85.70 ml  39.22 ml/m  AORTIC VALVE AV Area (Vmax):    1.74  cm AV Area (Vmean):   1.65 cm AV Area (VTI):     1.61 cm AV Vmax:           118.00 cm/s AV Vmean:          79.567 cm/s AV VTI:            0.220 m AV Peak Grad:      5.6 mmHg AV Mean Grad:      3.0 mmHg LVOT Vmax:         65.47 cm/s LVOT Vmean:        41.900 cm/s LVOT VTI:           0.113 m LVOT/AV VTI ratio: 0.51  AORTA Ao Root diam: 3.30 cm  SHUNTS Systemic VTI:  0.11 m Systemic Diam: 2.00 cm Dorris Carnes MD Electronically signed by Dorris Carnes MD Signature Date/Time: 03/20/2022/12:34:56 PM    Final    CT SOFT TISSUE NECK WO CONTRAST  Result Date: 03/20/2022 CLINICAL DATA:  Swallowed foreign body. EXAM: CT NECK WITHOUT CONTRAST TECHNIQUE: Multidetector CT imaging of the neck was performed following the standard protocol without intravenous contrast. RADIATION DOSE REDUCTION: This exam was performed according to the departmental dose-optimization program which includes automated exposure control, adjustment of the mA and/or kV according to patient size and/or use of iterative reconstruction technique. COMPARISON:  None Available. FINDINGS: Pharynx and larynx: Presence of endotracheal and enteric tubes limits assessment. Within this limitation, no radiopaque foreign body is visualized. There are large periapical lucencies along mandibular symphysis (series 3, image 54). Along the anterior margin of the mandibular symphysis there is a focal area of lucency (series 3, image 60), which is nonspecific, but infection is not excluded. Salivary glands: No inflammation, mass, or stone. Thyroid: Normal. Lymph nodes: None enlarged or abnormal density. Vascular: Negative. Limited intracranial: Negative. Visualized orbits: Negative. Mastoids and visualized paranasal sinuses: Hypoplastic mastoid sinuses. There is mild mucosal thickening in the right maxillary sinus with osseous findings suggestive of chronic sinusitis. Skeleton: No acute or aggressive process. There are degenerative changes in the cervical spine with grade 1 anterolisthesis of C4 on C5. Upper chest: Negative. Other: None. IMPRESSION: 1. No radiopaque foreign body is visualized. 2. Large periapical lucencies along the mandibular symphysis with a possible area of soft tissue infection along the midline chin. Correlate with physical  exam. Electronically Signed   By: Marin Ariatna Jester M.D.   On: 03/20/2022 09:49   CT HEAD WO CONTRAST (5MM)  Result Date: 03/20/2022 CLINICAL DATA:  Meningitis/Central nervous system infection suspected. Obtundation in ER. EXAM: CT HEAD WITHOUT CONTRAST TECHNIQUE: Contiguous axial images were obtained from the base of the skull through the vertex without intravenous contrast. RADIATION DOSE REDUCTION: This exam was performed according to the departmental dose-optimization program which includes automated exposure control, adjustment of the mA and/or kV according to patient size and/or use of iterative reconstruction technique. COMPARISON:  No prior brain imaging is available for comparison. FINDINGS: Brain: There is mild-to-moderate cortical atrophy, within normal limits for patient age. The ventricles are normal in configuration. The basilar cisterns are patent. No mass, mass effect, or midline shift. No acute intracranial hemorrhage is seen. No abnormal extra-axial fluid collection. There is mild periventricular white matter patchy hypodensity, greatest around the anterior horns of the lateral ventricles. This is nonspecific but most likely secondary to chronic ischemic white matter changes. Otherwise, preservation of the normal cortical gray-white interface without CT evidence of an acute major vascular territorial cortical based infarction. Vascular: No hyperdense  vessel or unexpected calcification. There are atherosclerotic intracranial calcifications within the skull base arteries. Skull: There is mild diastasis of the anterior aspect of the frontal suture, measuring up to approximately 15 mm in maximum dimension (axial series 5, image 51). This appears chronic. 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 The bilateral mastoid air cells are incompletely aerated, likely developmental. There may be mild stenosis of the bilateral external auditory canals  related to hypertrophic surrounding bone, also likely developmental. Sinuses/Orbits: The visualized orbits are unremarkable. The frontal sutures are developmentally non aerated. Moderate mucosal thickening of the ethmoid air cells. Mild peripheral mucosal opacification of the partially visualized maxillary sinuses. The sphenoid sinuses are mildly developmentally hypoplastic. Mild-to-moderate peripheral mucosal opacification within the right aspect of the sphenoid sinus. Other: There is developmental non-fusion of the anterior and posterior midline aspects of the C1 vertebral body. IMPRESSION: 1. No CT evidence of acute intracranial abnormality. Please note that this noncontrast CT modality has limited sensitivity for detection of meningitis, as classic meningitis findings include increased enhancement of the leptomeninges. 2. Mild-to-moderate cortical atrophy, within normal limits for patient age. 3. Mild periventricular white matter patchy hypodensity, nonspecific but most likely secondary to chronic ischemic white matter changes. 4. Mild diastasis of the anterior aspect of the frontal suture, which appears chronic and developmental. 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 5. 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 6. Developmental incomplete aeration of the mastoid air cells. There may be mild stenosis of the bilateral external auditory canals related to hypertrophic surrounding bone, also likely developmental. Electronically Signed   By: Yvonne Kendall M.D.   On: 03/20/2022 09:48   CT CHEST WO CONTRAST  Result Date: 03/20/2022 CLINICAL DATA:  Foreign body suspected. EXAM: CT CHEST AND ABDOMEN WITHOUT CONTRAST TECHNIQUE: Multidetector CT imaging of the chest and abdomen was performed following the standard protocol without intravenous contrast. RADIATION DOSE REDUCTION: This exam was performed  according to the departmental dose-optimization program which includes automated exposure control, adjustment of the mA and/or kV according to patient size and/or use of iterative reconstruction technique. COMPARISON:  Earlier chest and abdominal radiograph dated 03/19/2022. FINDINGS: Evaluation of this exam is limited in the absence of intravenous contrast. CT CHEST FINDINGS WITHOUT CONTRAST Cardiovascular: There is no cardiomegaly or pericardial effusion. Three-vessel coronary vascular calcification. Mild atherosclerotic calcification of the thoracic aorta. No aneurysmal dilatation or dissection. Mild dilatation of the main pulmonary trunk may represent a degree of pulmonary hypertension. Clinical correlation recommended. Mediastinum/Nodes: Mildly enlarged subcarinal lymph node measures 14 mm in short axis. No hilar adenopathy. Evaluation however is limited in the absence of intravenous contrast. An enteric tube noted within the esophagus. No mediastinal fluid collection. Lungs/Pleura: Bibasilar linear and streaky atelectasis. Pneumonia is not excluded. Clinical correlation is recommended. Background of centrilobular emphysema. No consolidative changes. There is no pleural effusion or pneumothorax. The central airways are patent. Endotracheal tube with tip approximately 3.5 cm above the carina. Musculoskeletal: No acute osseous pathology. CT ABDOMEN FINDINGS WITHOUT CONTRAST No intra-abdominal free air or free fluid. Hepatobiliary: The liver is unremarkable. Probable sludge within the gallbladder. No calcified gallstone or pericholecystic fluid. Pancreas: Apparent 14 mm low attenuating nodule arising from the distal body of the pancreas (coronal 44/513) not evaluated on this noncontrast CT. This may represent a nodular pancreatic parenchyma or a pancreatic lesion such as a  side branch IPMN. Further characterization with MRI without and with contrast on a nonemergent/outpatient basis recommended. No active  inflammatory changes of the pancreas. No dilatation of the main pancreatic duct. Spleen: Normal in size without focal abnormality. Adrenals/Urinary Tract: The adrenal glands are unremarkable. There is no hydronephrosis or nephrolithiasis on either side. The visualized ureters appear unremarkable. Stomach/Bowel: Enteric tube with tip in the region of the proximal duodenum. The stomach is not distended. No radiopaque foreign object noted within the stomach. There is moderate stool throughout the visualized colon. No bowel dilatation. Vascular/Lymphatic: Mild aortoiliac atherosclerotic disease. The IVC is unremarkable. No portal venous gas. There is no adenopathy. Other: None Musculoskeletal: Degenerative changes of the spine. No acute osseous pathology. IMPRESSION: 1. Bibasilar linear and streaky atelectasis. Pneumonia is not excluded. 2. No acute intra-abdominal pathology. No radiopaque foreign object noted within the stomach. 3. Apparent 14 mm low attenuating nodule arising from the distal body of the pancreas not evaluated on this noncontrast CT. Further characterization with MRI without and with contrast on a nonemergent/outpatient basis recommended. 4. Moderate stool throughout the visualized colon. No bowel dilatation. 5. Aortic Atherosclerosis (ICD10-I70.0) and Emphysema (ICD10-J43.9). Electronically Signed   By: Anner Crete M.D.   On: 03/20/2022 00:47   CT ABDOMEN LIMITED WO CONTRAST  Result Date: 03/20/2022 CLINICAL DATA:  Foreign body suspected. EXAM: CT CHEST AND ABDOMEN WITHOUT CONTRAST TECHNIQUE: Multidetector CT imaging of the chest and abdomen was performed following the standard protocol without intravenous contrast. RADIATION DOSE REDUCTION: This exam was performed according to the departmental dose-optimization program which includes automated exposure control, adjustment of the mA and/or kV according to patient size and/or use of iterative reconstruction technique. COMPARISON:  Earlier  chest and abdominal radiograph dated 03/19/2022. FINDINGS: Evaluation of this exam is limited in the absence of intravenous contrast. CT CHEST FINDINGS WITHOUT CONTRAST Cardiovascular: There is no cardiomegaly or pericardial effusion. Three-vessel coronary vascular calcification. Mild atherosclerotic calcification of the thoracic aorta. No aneurysmal dilatation or dissection. Mild dilatation of the main pulmonary trunk may represent a degree of pulmonary hypertension. Clinical correlation recommended. Mediastinum/Nodes: Mildly enlarged subcarinal lymph node measures 14 mm in short axis. No hilar adenopathy. Evaluation however is limited in the absence of intravenous contrast. An enteric tube noted within the esophagus. No mediastinal fluid collection. Lungs/Pleura: Bibasilar linear and streaky atelectasis. Pneumonia is not excluded. Clinical correlation is recommended. Background of centrilobular emphysema. No consolidative changes. There is no pleural effusion or pneumothorax. The central airways are patent. Endotracheal tube with tip approximately 3.5 cm above the carina. Musculoskeletal: No acute osseous pathology. CT ABDOMEN FINDINGS WITHOUT CONTRAST No intra-abdominal free air or free fluid. Hepatobiliary: The liver is unremarkable. Probable sludge within the gallbladder. No calcified gallstone or pericholecystic fluid. Pancreas: Apparent 14 mm low attenuating nodule arising from the distal body of the pancreas (coronal 44/513) not evaluated on this noncontrast CT. This may represent a nodular pancreatic parenchyma or a pancreatic lesion such as a side branch IPMN. Further characterization with MRI without and with contrast on a nonemergent/outpatient basis recommended. No active inflammatory changes of the pancreas. No dilatation of the main pancreatic duct. Spleen: Normal in size without focal abnormality. Adrenals/Urinary Tract: The adrenal glands are unremarkable. There is no hydronephrosis or  nephrolithiasis on either side. The visualized ureters appear unremarkable. Stomach/Bowel: Enteric tube with tip in the region of the proximal duodenum. The stomach is not distended. No radiopaque foreign object noted within the stomach. There is moderate stool throughout the visualized colon. No  bowel dilatation. Vascular/Lymphatic: Mild aortoiliac atherosclerotic disease. The IVC is unremarkable. No portal venous gas. There is no adenopathy. Other: None Musculoskeletal: Degenerative changes of the spine. No acute osseous pathology. IMPRESSION: 1. Bibasilar linear and streaky atelectasis. Pneumonia is not excluded. 2. No acute intra-abdominal pathology. No radiopaque foreign object noted within the stomach. 3. Apparent 14 mm low attenuating nodule arising from the distal body of the pancreas not evaluated on this noncontrast CT. Further characterization with MRI without and with contrast on a nonemergent/outpatient basis recommended. 4. Moderate stool throughout the visualized colon. No bowel dilatation. 5. Aortic Atherosclerosis (ICD10-I70.0) and Emphysema (ICD10-J43.9). Electronically Signed   By: Anner Crete M.D.   On: 03/20/2022 00:47   DG Neck Soft Tissue  Result Date: 03/20/2022 CLINICAL DATA:  [id number], [id number].  Foreign body check. EXAM: NECK SOFT TISSUES - 1+ VIEW COMPARISON:  None Available. FINDINGS: There is no evidence of retropharyngeal soft tissue swelling. The epiglottis is obscured due to orotracheal and oroenteric intubation. Calcifications are present in the arytenoid cartilage complex but there is no visible foreign body. There are degenerative changes of cervical spine greatest at C5-6. 3 mm C4-5 anterolisthesis is probably degenerative in etiology. IMPRESSION: 1. No visible foreign body. 2. The epiglottis is obscured due to orotracheal and oroenteric intubation. 3. Degenerative changes of the cervical spine greatest at C5-6. Electronically Signed   By: Telford Nab M.D.   On:  03/20/2022 00:18   DG Abd 1 View  Result Date: 03/20/2022 CLINICAL DATA:  Concern for foreign body. EXAM: ABDOMEN - 1 VIEW COMPARISON:  Chest radiograph dated 03/19/2022. FINDINGS: No radiopaque foreign object identified. Partially visualized enteric tube with tip in the distal stomach. No bowel dilatation or evidence of obstruction. No free air or radiopaque calculi. The osseous structures are intact the soft tissues are unremarkable. IMPRESSION: No radiopaque foreign object identified. Enteric tube in the distal stomach. Electronically Signed   By: Anner Crete M.D.   On: 03/20/2022 00:08   Portable Chest x-ray  Result Date: 03/19/2022 CLINICAL DATA:  Endotracheal tube EXAM: PORTABLE CHEST 1 VIEW COMPARISON:  Chest x-ray 03/19/2022 FINDINGS: Endotracheal tube tip is 3 cm above the carina. Enteric tube tip is in the distal stomach. The heart is enlarged, unchanged. There is some minimal patchy opacities in both lung bases, unchanged. There is no pleural effusion or pneumothorax. No acute fractures are seen. IMPRESSION: 1. Endotracheal tube tip is 3 cm above the carina. 2. Minimal patchy opacities in both lung bases, unchanged. Electronically Signed   By: Ronney Asters M.D.   On: 03/19/2022 19:06   DG Chest Port 1 View  Result Date: 03/19/2022 CLINICAL DATA:  Post intubation.  Foreign body in throat. EXAM: PORTABLE CHEST 1 VIEW COMPARISON:  Chest x-ray 03/19/2022 FINDINGS: Endotracheal tube tip is 2.6 cm above the carina. The cardiomediastinal silhouette is stable, the heart is enlarged. Central interstitial prominence persists. No focal lung infiltrate, pleural effusion or pneumothorax. No acute fractures. IMPRESSION: 1. Endotracheal tube tip is 2.6 cm above the carina. 2. Stable cardiomegaly and central interstitial prominence. Electronically Signed   By: Ronney Asters M.D.   On: 03/19/2022 15:26     Assessment and Plan:   Brian Parsons is a 71 y.o. male with a hx of HFpEF, nonobstructive CAD,  hypertension, diabetes, COPD, pulmonary hypertension, O2 dependent, polycythemia who is being seen 03/23/2022 for the evaluation of CHF at the request of Dr. Bonner Puna.  HFrEF Severely reduced RV Pulmonary HTN/Cor pulmonale -- reports several days  of dyspnea prior to admission. Chronic LE edema, maybe slightly worse than his baseline -- echo this admission with severely reduced RV function with moderate to severe LV dysfunction -- on torsemide '80mg'$  BID, along with metolazone 2.'5mg'$  QOD (holding metolazone today and am dose of torsemide with plans for cath) -- would benefit from [facility] to determine volume status vs pulmonary disease, plan for tomorrow afternoon given Eliquis this morning  Shared Decision Making/Informed Consent The risks [stroke (1 in 1000), death (1 in 1000), kidney failure [usually temporary] (1 in 500), bleeding (1 in 200), allergic reaction [possibly serious] (1 in 200)], benefits (diagnostic support and management of coronary artery disease) and alternatives of a cardiac catheterization were discussed in detail with Brian Parsons and he is willing to proceed.  Acute on Chronic hypoxic respiratory failure COPD Influenza -- on 3L [place] chronically. Initially required intubation-->> Bipap and now back to [place] '@6L'$  -- antibiotics/Tamiflu per primary  Nonobstructive CAD -- noted on cath 2017 -- hsTn 397>>[id number]>>[id number], given further decline in LV function, need to rule out obstructive disease -- as above, plan for [facility] tomorrow afternoon  Multifocal atrial tachycardia -- there was question of possible Afib, but no clear tracings to confirm -- recently wore outpt Zio, no Afib noted -- was started on Eliquis here (last dose 1030am), will stop with need for cath. Do not anticipate resumption unless Afib is confirmed  CKD with AKI -- Cr 3.81 on admission, now improved to 1.43 -- will hold metolazone today with plans for cardiac cath tomorrow, hold morning dose of torsemide -- follow BMET  Per  primary: Polycythemia Thrombocytopenia Pancreatic nodule   Risk Assessment/Risk Scores:   New York Heart Association (NYHA) Functional Class NYHA Class III   For questions or updates, please contact [place] Please consult [email or web address] for contact info under    Signed, Reino Bellis, NP  03/23/2022 1:47 PM

## 2022-03-23 NOTE — H&P (Signed)
COPD with chronic O2 use and severe PAH. Chronic hypercarbia LV and RV systolic dysfunction Near futility.Prior DNR No sedation, due to hypercarbia.

## 2022-03-23 NOTE — Progress Notes (Signed)
[facility] PROGRESS NOTE  Brian Parsons (DOB: 08/04/1950) WUJ:[id number] PCP: [name], MD  Brief Narrative: Brian Parsons is a 71 y.o. 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 He was found to be positive for influenza and treated for AECOPD, AKI, and started on anticoagulation for possible new diagnosis of atrial fibrillation. Ultimately weaned to BiPAP and transferred to medical floor on hospitalist service 12/6.  Subjective: Feeling breathing is better, but stuck at 6L O2 with weaning efforts this morning per pt and RN. No chest pain or worsened dyspnea. Reports orthopnea that is chronic for years. No PND. Leg swelling chronically and a bit worse currently.  Objective: BP 121/82 (BP Location: Left Arm)   Pulse 76   Temp 97.8 F (36.6 C) (Oral)   Resp 15   Ht 6' (1.829 m)   Wt 87.5 kg   SpO2 97%   BMI 26.16 kg/m   Gen: Elderly male in no acute distress Pulm: End-expiratory wheezes noted, globally very diminished, nonlabored at rest, no crackles  CV: Frequent premature beats, PVCs on monitor and on telemetry review.  GI: Soft, NT, ND, +BS Neuro: Alert and oriented. No new focal deficits. Ext: Warm, no deformities, +acrocyanosis that appears chronic, +clubbing Skin: No acute rashes, lesions or ulcers on visualized skin   Assessment & Plan: Acute on chronic hypoxic respiratory failure: Due to AECOPD due to influenza infection:  - Wean oxygen as tolerated to baseline 2-3LPM. Still ~6L and unable to wean from that on attempts today per discussion with RN. - Treat conditions below  AECOPD:  - Wheezing improved. Continue steroids likely with longer taper, continue scheduled  and prn nebs. Completed azithromycin x3 days. - Cardioselective BB as below  Influenza:  - Complete tamiflu x5 days renally dosed (12/5 - 12/9)  AKI on stage IIIb CKD: Resolved AKI.  - Stopped IVF. Restarting diuretics, will monitor.  MAT, frequent PVCs: On my review of ECGs, there appears to be polymorphic p waves at times tachycardic, at times normal rate. Cardiology [facility]) as outpatient notes zio patch showed MAT. I don't see definite atrial fibrillation.  - Give Mg now, check in AM. K is 4.9, will not supp, but monitor in AM.  - Continue new eliquis (was started for AFib) for now until cardiology weighs in. - Started metoprolol (home coreg less favored with bronchospasm)  New HFrEF, chronic HFpEF, cor pulmonale, nonobstructive CAD: Echo this admit with poor windows on vent, septal flattening consistent with RV volume/pressure overload, and mod-severely reduced LVEF, global hypokinesis.  - No RWMA, though significant RF for obstructive disease, now new depressed LVEF. Would appreciate cardiology assessment.  - Added metoprolol succinate. - AKI/CKD currently limiting ACE/ARB/ARNI/spironolactone, though creatinine is improving. - Given IVF earlier in hospitalization, Cr improved and now mildly overloaded, restarting torsemide '80mg'$  po BID this AM. Also gets metolazone QOD which we will give as well.  - Continue ASA, statin.   Secondary polycythemia: Due to chronic hypoxia. No Tx indicated at this time.  Thrombocytopenia: Monitor, mild without bleeding.   Shock related to sedation: Resolved.   Question of foreign body on laryngoscopy: Not seen on reevaluation or imaging.   Moderate protein calorie malnutrition:  - Supp protein  Pancreatic nodule: 7m low attenuation on  NCCT.  - Consider outpatient MRI with and without contrast.   [name], MD [facility] [email or web address] 03/23/2022, 2:04 PM

## 2022-03-23 NOTE — Plan of Care (Signed)

## 2022-03-24 ENCOUNTER — Encounter (HOSPITAL_COMMUNITY)
Admission: EM | Disposition: A | Discharge status: Home Health | Payer: Self-pay | Source: Home / Self Care | Attending: Family Medicine

## 2022-03-24 ENCOUNTER — Encounter (HOSPITAL_COMMUNITY): Payer: Self-pay | Admitting: Internal Medicine

## 2022-03-24 DIAGNOSIS — I2510 Atherosclerotic heart disease of native coronary artery without angina pectoris: Secondary | ICD-10-CM | POA: Diagnosis not present

## 2022-03-24 DIAGNOSIS — E440 Moderate protein-calorie malnutrition: Secondary | ICD-10-CM

## 2022-03-24 DIAGNOSIS — I498 Other specified cardiac arrhythmias: Secondary | ICD-10-CM | POA: Diagnosis not present

## 2022-03-24 DIAGNOSIS — J1100 Influenza due to unidentified influenza virus with unspecified type of pneumonia: Secondary | ICD-10-CM

## 2022-03-24 DIAGNOSIS — J9601 Acute respiratory failure with hypoxia: Secondary | ICD-10-CM | POA: Diagnosis not present

## 2022-03-24 DIAGNOSIS — I50812 Chronic right heart failure: Secondary | ICD-10-CM | POA: Diagnosis not present

## 2022-03-24 DIAGNOSIS — R609 Edema, unspecified: Secondary | ICD-10-CM | POA: Diagnosis not present

## 2022-03-24 HISTORY — PX: RIGHT/LEFT HEART CATH AND CORONARY ANGIOGRAPHY: CATH118266

## 2022-03-24 LAB — BASIC METABOLIC PANEL
BUN: 48 mg/dL — ABNORMAL HIGH (ref 8–23)
CO2: >45 mmol/L — ABNORMAL HIGH (ref 22–32)
Calcium: 9.5 mg/dL (ref 8.9–10.3)
Chloride: 80 mmol/L — ABNORMAL LOW (ref 98–111)
Creatinine, Ser: 1.23 mg/dL (ref 0.61–1.24)
GFR, Estimated: >60 mL/min (ref >60)
Glucose, Bld: 109 mg/dL — ABNORMAL HIGH (ref 70–99)
Potassium: 4.2 mmol/L (ref 3.5–5.1)
Sodium: 140 mmol/L (ref 135–145)

## 2022-03-24 LAB — POCT I-STAT EG7
Acid-Base Excess: 20 mmol/L — ABNORMAL HIGH (ref 0.0–2.0)
Acid-Base Excess: 19 mmol/L — ABNORMAL HIGH (ref 0.0–2.0)
Bicarbonate: 51.8 mmol/L — ABNORMAL HIGH (ref 20.0–28.0)
Bicarbonate: 50.5 mmol/L — ABNORMAL HIGH (ref 20.0–28.0)
Calcium, Ion: 1.11 mmol/L — ABNORMAL LOW (ref 1.15–1.40)
Calcium, Ion: 0.85 mmol/L — CL (ref 1.15–1.40)
HCT: 53 % — ABNORMAL HIGH (ref 39.0–52.0)
HCT: 53 % — ABNORMAL HIGH (ref 39.0–52.0)
Hemoglobin: 18 g/dL — ABNORMAL HIGH (ref 13.0–17.0)
Hemoglobin: 18 g/dL — ABNORMAL HIGH (ref 13.0–17.0)
O2 Saturation: 58 %
O2 Saturation: 62 %
Potassium: 3.4 mmol/L — ABNORMAL LOW (ref 3.5–5.1)
Potassium: 3.3 mmol/L — ABNORMAL LOW (ref 3.5–5.1)
Sodium: 145 mmol/L (ref 135–145)
Sodium: 145 mmol/L (ref 135–145)
TCO2: >50 mmol/L — ABNORMAL HIGH (ref 22–32)
TCO2: >50 mmol/L — ABNORMAL HIGH (ref 22–32)
pCO2, Ven: 81.8 mmHg (ref 44–60)
pCO2, Ven: 79.7 mmHg (ref 44–60)
pH, Ven: 7.409 (ref 7.25–7.43)
pH, Ven: 7.409 (ref 7.25–7.43)
pO2, Ven: 33 mmHg (ref 32–45)
pO2, Ven: 35 mmHg (ref 32–45)

## 2022-03-24 LAB — POCT I-STAT 7, (LYTES, BLD GAS, ICA,H+H)
Acid-Base Excess: 16 mmol/L — ABNORMAL HIGH (ref 0.0–2.0)
Bicarbonate: 44.7 mmol/L — ABNORMAL HIGH (ref 20.0–28.0)
Calcium, Ion: 0.71 mmol/L — CL (ref 1.15–1.40)
HCT: 49 % (ref 39.0–52.0)
Hemoglobin: 16.7 g/dL (ref 13.0–17.0)
O2 Saturation: 87 %
Potassium: 2.9 mmol/L — ABNORMAL LOW (ref 3.5–5.1)
Sodium: 148 mmol/L — ABNORMAL HIGH (ref 135–145)
TCO2: 47 mmol/L — ABNORMAL HIGH (ref 22–32)
pCO2 arterial: 67.3 mmHg (ref 32–48)
pH, Arterial: 7.43 (ref 7.35–7.45)
pO2, Arterial: 54 mmHg — ABNORMAL LOW (ref 83–108)

## 2022-03-24 LAB — CBC
HCT: 58.6 % — ABNORMAL HIGH (ref 39.0–52.0)
Hemoglobin: 18.6 g/dL — ABNORMAL HIGH (ref 13.0–17.0)
MCH: 29.8 pg (ref 26.0–34.0)
MCHC: 31.7 g/dL (ref 30.0–36.0)
MCV: 93.8 fL (ref 80.0–100.0)
Platelets: 106 10*3/uL — ABNORMAL LOW (ref 150–400)
RBC: 6.25 MIL/uL — ABNORMAL HIGH (ref 4.22–5.81)
RDW: 16.2 % — ABNORMAL HIGH (ref 11.5–15.5)
WBC: 9 10*3/uL (ref 4.0–10.5)
nRBC: 0.2 % (ref 0.0–0.2)

## 2022-03-24 LAB — GLUCOSE, CAPILLARY
Glucose-Capillary: 99 mg/dL (ref 70–99)
Glucose-Capillary: 120 mg/dL — ABNORMAL HIGH (ref 70–99)
Glucose-Capillary: 204 mg/dL — ABNORMAL HIGH (ref 70–99)
Glucose-Capillary: 128 mg/dL — ABNORMAL HIGH (ref 70–99)

## 2022-03-24 LAB — POCT ACTIVATED CLOTTING TIME: Activated Clotting Time: 147 seconds

## 2022-03-24 LAB — MAGNESIUM: Magnesium: 2.4 mg/dL (ref 1.7–2.4)

## 2022-03-24 LAB — CULTURE, BLOOD (ROUTINE X 2)
Culture: NO GROWTH
Culture: NO GROWTH
Special Requests: ADEQUATE
Special Requests: ADEQUATE

## 2022-03-24 SURGERY — RIGHT/LEFT HEART CATH AND CORONARY ANGIOGRAPHY
Anesthesia: LOCAL

## 2022-03-24 MED ORDER — IOHEXOL 350 MG/ML SOLN
INTRAVENOUS | Status: DC | PRN
  Administered 2022-03-24: 40 mL

## 2022-03-24 MED ORDER — VERAPAMIL HCL 2.5 MG/ML IV SOLN
INTRAVENOUS | Status: AC
  Filled 2022-03-24: qty 2

## 2022-03-24 MED ORDER — SODIUM CHLORIDE 0.9 % IV SOLN: 250.0000 mL | INTRAVENOUS | Status: DC | PRN

## 2022-03-24 MED ORDER — HEPARIN SODIUM (PORCINE) 1000 UNIT/ML IJ SOLN
INTRAMUSCULAR | Status: DC | PRN
  Administered 2022-03-24: 4000 [IU] via INTRAVENOUS

## 2022-03-24 MED ORDER — ACETAMINOPHEN 325 MG PO TABS: 650.0000 mg | ORAL_TABLET | ORAL | Status: DC | PRN

## 2022-03-24 MED ORDER — SODIUM CHLORIDE 0.9% FLUSH: 3.0000 mL | INTRAVENOUS | Status: DC | PRN

## 2022-03-24 MED ORDER — HEPARIN (PORCINE) IN NACL 1000-0.9 UT/500ML-% IV SOLN
INTRAVENOUS | Status: AC
  Filled 2022-03-24: qty 500

## 2022-03-24 MED ORDER — LIDOCAINE HCL (PF) 1 % IJ SOLN
INTRAMUSCULAR | Status: DC | PRN
  Administered 2022-03-24 (×2): 2 mL

## 2022-03-24 MED ORDER — HYDRALAZINE HCL 20 MG/ML IJ SOLN: 10.0000 mg | INTRAMUSCULAR | Status: AC | PRN

## 2022-03-24 MED ORDER — LABETALOL HCL 5 MG/ML IV SOLN: 10.0000 mg | INTRAVENOUS | Status: AC | PRN

## 2022-03-24 MED ORDER — HEPARIN SODIUM (PORCINE) 1000 UNIT/ML IJ SOLN
INTRAMUSCULAR | Status: AC
  Filled 2022-03-24: qty 10

## 2022-03-24 MED ORDER — ONDANSETRON HCL 4 MG/2ML IJ SOLN: 4.0000 mg | Freq: Four times a day (QID) | INTRAMUSCULAR | Status: DC | PRN

## 2022-03-24 MED ORDER — ENOXAPARIN SODIUM 40 MG/0.4ML IJ SOSY
40.0000 mg | PREFILLED_SYRINGE | INTRAMUSCULAR | Status: DC
  Administered 2022-03-25: 40 mg via SUBCUTANEOUS
  Filled 2022-03-24: qty 0.4

## 2022-03-24 MED ORDER — SODIUM CHLORIDE 0.9% FLUSH
3.0000 mL | Freq: Two times a day (BID) | INTRAVENOUS | Status: DC
  Administered 2022-03-24 – 2022-03-25 (×2): 3 mL via INTRAVENOUS

## 2022-03-24 MED ORDER — VERAPAMIL HCL 2.5 MG/ML IV SOLN
INTRAVENOUS | Status: DC | PRN
  Administered 2022-03-24: 10 mL via INTRA_ARTERIAL

## 2022-03-24 MED ORDER — HEPARIN (PORCINE) IN NACL 1000-0.9 UT/500ML-% IV SOLN
INTRAVENOUS | Status: DC | PRN
  Administered 2022-03-24 (×2): 500 mL

## 2022-03-24 MED ORDER — LIDOCAINE HCL (PF) 1 % IJ SOLN
INTRAMUSCULAR | Status: AC
  Filled 2022-03-24: qty 30

## 2022-03-24 SURGICAL SUPPLY — 10 items
CATH 5FR JL3.5 JR4 ANG PIG MP (CATHETERS) IMPLANT
CATH SWAN GANZ 7F STRAIGHT (CATHETERS) IMPLANT
DEVICE RAD COMP TR BAND LRG (VASCULAR PRODUCTS) IMPLANT
GLIDESHEATH SLEND SS 6F .021 (SHEATH) IMPLANT
GLIDESHEATH SLENDER 7FR .021G (SHEATH) IMPLANT
GUIDEWIRE .025 260CM (WIRE) IMPLANT
GUIDEWIRE INQWIRE 1.5J.035X260 (WIRE) IMPLANT
INQWIRE 1.5J .035X260CM (WIRE) ×1
PACK CARDIAC CATHETERIZATION (CUSTOM PROCEDURE TRAY) ×1 IMPLANT
TRANSDUCER W/STOPCOCK (MISCELLANEOUS) ×1 IMPLANT

## 2022-03-24 NOTE — H&P (View-Only) (Signed)
Primary Cardiologist:  [place] cardiology Jacques Earthly   Subjective:  Dyspnea and LE edema improved no chest pain  Discussed cath again at length He is fine to have it So long as it is not from leg   Objective:  Vitals:   03/23/22 2104 03/23/22 2258 03/23/22 2300 03/24/22 0321  BP:  (!) 137/97 (!) 137/97 125/87  Pulse:  74 97 79  Resp:  18 (!) 21 20  Temp:  97.8 F (36.6 C)  97.9 F (36.6 C)  TempSrc:  Oral  Oral  SpO2: 100% 99%  100%  Weight:      Height:        Intake/Output from previous day:  Intake/Output Summary (Last 24 hours) at 03/24/2022 0800 Last data filed at 03/24/2022 5366 Gross per 24 hour  Intake 480 ml  Output 6450 ml  Net -5970 ml    Physical Exam: Chronically ill COPDer Exp wheeze rhonchi No murmur Abdomen benign Plus one edema bilateral with stasis 18 g right antecubital in place   Lab Results: Basic Metabolic Panel: Recent Labs    03/23/22 0032 03/24/22 0027  NA 142 140  K 4.9 4.2  CL 89* 80*  CO2 45* >45*  GLUCOSE 120* 109*  BUN 62* 48*  CREATININE 1.43* 1.23  CALCIUM 9.4 9.5  MG  --  2.4   Liver Function Tests: No results for input(s): "AST", "ALT", "ALKPHOS", "BILITOT", "PROT", "ALBUMIN" in the last 72 hours. No results for input(s): "LIPASE", "AMYLASE" in the last 72 hours. CBC: Recent Labs    03/23/22 0032 03/24/22 0027  WBC 7.9 9.0  HGB 17.2* 18.6*  HCT 57.1* 58.6*  MCV 97.1 93.8  PLT 106* 106*     Imaging: No results found.  Cardiac Studies:  ECG:    Telemetry:  SR PAC;s   Echo: read as moderate to severe LV dysfunction Primary issue is cor                         Pulmonale with severe RV failure mild MR   Medications:    allopurinol  200 mg Oral BID   arformoterol  15 mcg Nebulization BID   aspirin  81 mg Oral Daily   atorvastatin  20 mg Oral Daily   budesonide (PULMICORT) nebulizer solution  0.5 mg Nebulization BID   Chlorhexidine Gluconate Cloth  6 each Topical Daily   docusate sodium   100 mg Oral BID   DULoxetine  30 mg Oral Daily   escitalopram  20 mg Oral Daily   feeding supplement  237 mL Oral BID BM   insulin aspart  0-5 Units Subcutaneous QHS   insulin aspart  0-9 Units Subcutaneous TID WC   linaclotide  145 mcg Oral Daily   [START ON 03/25/2022] metolazone  2.5 mg Oral QODAY   metoprolol succinate  12.5 mg Oral Daily   multivitamin with minerals  1 tablet Oral Daily   oseltamivir  30 mg Oral BID   predniSONE  40 mg Oral Q breakfast   revefenacin  175 mcg Nebulization Daily   torsemide  80 mg Oral BID      sodium chloride Stopped (03/19/22 2025)   sodium chloride 10 mL/hr at 03/24/22 4403    Assessment/Plan:   CHF:  Review of his echo to me shows EF 50-55% not as bad as read Issue is severe cor pulmonale with septal flattening and severe RV failure from his COPD with polycythemia and  oxygen dependence for 10 years. He has markedly abnormal ECG with inferior lateral T wave inversions post extubation He is ok to have cath so long as it is not from leg ? Bad experience with Dr Claiborne Billings on cath done from leg in 2017 We have already started an 18 g right antecubital for right heart cath. Last dose of eliquis was yesterday morning On as last case for Dr Tamala Julian today. Need to assess degree of WHO 3 pulmonary HTN and see if PCWP elevated compared to PAd.   Rhythm:  I see no PAF. He has had NSR, MAT and PAC;s d/c eliquis continue beta blocker COPD:  per primary service on steroids with oxygen and nebs   Jenkins Rouge 03/24/2022, 8:00 AM

## 2022-03-24 NOTE — Progress Notes (Signed)
Primary Cardiologist:  [place] cardiology Jacques Earthly   Subjective:  Dyspnea and LE edema improved no chest pain  Discussed cath again at length He is fine to have it So long as it is not from leg   Objective:  Vitals:   03/23/22 2104 03/23/22 2258 03/23/22 2300 03/24/22 0321  BP:  (!) 137/97 (!) 137/97 125/87  Pulse:  74 97 79  Resp:  18 (!) 21 20  Temp:  97.8 F (36.6 C)  97.9 F (36.6 C)  TempSrc:  Oral  Oral  SpO2: 100% 99%  100%  Weight:      Height:        Intake/Output from previous day:  Intake/Output Summary (Last 24 hours) at 03/24/2022 0800 Last data filed at 03/24/2022 4431 Gross per 24 hour  Intake 480 ml  Output 6450 ml  Net -5970 ml    Physical Exam: Chronically ill COPDer Exp wheeze rhonchi No murmur Abdomen benign Plus one edema bilateral with stasis 18 g right antecubital in place   Lab Results: Basic Metabolic Panel: Recent Labs    03/23/22 0032 03/24/22 0027  NA 142 140  K 4.9 4.2  CL 89* 80*  CO2 45* >45*  GLUCOSE 120* 109*  BUN 62* 48*  CREATININE 1.43* 1.23  CALCIUM 9.4 9.5  MG  --  2.4   Liver Function Tests: No results for input(s): "AST", "ALT", "ALKPHOS", "BILITOT", "PROT", "ALBUMIN" in the last 72 hours. No results for input(s): "LIPASE", "AMYLASE" in the last 72 hours. CBC: Recent Labs    03/23/22 0032 03/24/22 0027  WBC 7.9 9.0  HGB 17.2* 18.6*  HCT 57.1* 58.6*  MCV 97.1 93.8  PLT 106* 106*     Imaging: No results found.  Cardiac Studies:  ECG:    Telemetry:  SR PAC;s   Echo: read as moderate to severe LV dysfunction Primary issue is cor                         Pulmonale with severe RV failure mild MR   Medications:    allopurinol  200 mg Oral BID   arformoterol  15 mcg Nebulization BID   aspirin  81 mg Oral Daily   atorvastatin  20 mg Oral Daily   budesonide (PULMICORT) nebulizer solution  0.5 mg Nebulization BID   Chlorhexidine Gluconate Cloth  6 each Topical Daily   docusate sodium   100 mg Oral BID   DULoxetine  30 mg Oral Daily   escitalopram  20 mg Oral Daily   feeding supplement  237 mL Oral BID BM   insulin aspart  0-5 Units Subcutaneous QHS   insulin aspart  0-9 Units Subcutaneous TID WC   linaclotide  145 mcg Oral Daily   [START ON 03/25/2022] metolazone  2.5 mg Oral QODAY   metoprolol succinate  12.5 mg Oral Daily   multivitamin with minerals  1 tablet Oral Daily   oseltamivir  30 mg Oral BID   predniSONE  40 mg Oral Q breakfast   revefenacin  175 mcg Nebulization Daily   torsemide  80 mg Oral BID      sodium chloride Stopped (03/19/22 2025)   sodium chloride 10 mL/hr at 03/24/22 5400    Assessment/Plan:   CHF:  Review of his echo to me shows EF 50-55% not as bad as read Issue is severe cor pulmonale with septal flattening and severe RV failure from his COPD with polycythemia and  oxygen dependence for 10 years. He has markedly abnormal ECG with inferior lateral T wave inversions post extubation He is ok to have cath so long as it is not from leg ? Bad experience with Dr Claiborne Billings on cath done from leg in 2017 We have already started an 18 g right antecubital for right heart cath. Last dose of eliquis was yesterday morning On as last case for Dr Tamala Julian today. Need to assess degree of WHO 3 pulmonary HTN and see if PCWP elevated compared to PAd.   Rhythm:  I see no PAF. He has had NSR, MAT and PAC;s d/c eliquis continue beta blocker COPD:  per primary service on steroids with oxygen and nebs   Jenkins Rouge 03/24/2022, 8:00 AM

## 2022-03-24 NOTE — Progress Notes (Signed)
[facility] PROGRESS NOTE  Brian Parsons (DOB: [birth year]) IBB:[id number] PCP: [name], MD  Brief Narrative: Brian Parsons is a 71 y.o. 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 He was found to be positive for influenza and treated for AECOPD, AKI, and started on anticoagulation for possible new diagnosis of atrial fibrillation. Ultimately weaned to BiPAP and transferred to medical floor on hospitalist service 12/6.  Subjective: Feeling his breathing is better every day, still with some intermittent wheezing. No recent or current chest pain. Ready for cath.  Objective: BP 116/83 (BP Location: Left Arm)   Pulse 79   Temp 97.7 F (36.5 C) (Oral)   Resp (!) 21   Ht 6' (1.829 m)   Wt 87.5 kg   SpO2 93%   BMI 26.16 kg/m   Gen: Chronically ill-appearing male with forehead bossing stable Pulm: Nonlabored, mildly tachypneic with prolonged expiration though wheezes much improved.   CV: Regular with frequent premature beats, 1+ dependent edema GI: Soft, NT, ND, +BS  Neuro: Alert and oriented. No new focal deficits. Ext: Warm, no deformities. +clubbing. Skin: Lower leg stasis dermatitis changes, no new rashes, lesions or ulcers on visualized skin   Assessment & Plan: Acute on chronic hypoxic respiratory failure: Due to AECOPD due to influenza infection:  - Wean oxygen as tolerated to baseline 2-3LPM. Still ~6L and unable to wean from that on attempts today per discussion with RN. Will most likely require higher supplementation than previous for some time. Blood gases 12/8 confirm CO2 retention with compensation to normal pH. - Treat conditions below  AECOPD:  - Wheezing continues  to improve. Continue steroids likely with longer taper, continue scheduled and prn nebs. Completed azithromycin x3 days. - Cardioselective BB as below  Influenza:  - Complete tamiflu x5 days renally dosed and augmented dosing with improvement in renal function (12/5 - 12/9)  AKI on stage IIIb CKD: Resolved AKI, further improved after restarting diuretics.  - Avoid nephrotoxins, monitor after contrast with cath.  MAT, frequent PVCs: Cardiology [facility]) as outpatient notes zio patch showed MAT. I don't see definite atrial fibrillation, cardiology agrees. Eliquis stopped.  - Mg 2.4, K 4.2.   - Started metoprolol (home coreg less favored with bronchospasm) - Enoxaparin for VTE ppx.  New HFrEF, chronic HFpEF, cor pulmonale, nonobstructive CAD: Echo this admit with poor windows on vent, septal flattening consistent with RV volume/pressure overload, and mod-severely reduced LVEF, global hypokinesis.  - No RWMA, though significant RF for obstructive disease, now new depressed LVEF. Aprpeciate cardiology assessment, will follow cath results.  - Added metoprolol succinate. - AKI/CKD currently limiting ACE/ARB/ARNI/spironolactone, though creatinine is improving. - Continue torsemide '80mg'$  po BID, metolazone QOD  - Continue ASA, statin.   Secondary polycythemia: Due to chronic hypoxia. No Tx indicated at this time.  Thrombocytopenia: Monitor, mild without bleeding.   Shock related to sedation: Resolved.   Question of foreign body on laryngoscopy: Not seen on reevaluation or imaging.   Moderate protein calorie malnutrition:  - Supp protein  Pancreatic nodule: 35m low attenuation on NCCT.  - Consider outpatient MRI with and without contrast.   RPatrecia Pour MD [facility] [email or web address] 03/24/2022, 2:50 PM

## 2022-03-24 NOTE — Interval H&P Note (Signed)
History and Physical Interval Note:  03/24/2022 10:08 AM  Brian Parsons  has presented today for surgery, with the diagnosis of heart failure.  The various methods of treatment have been discussed with the patient and family. After consideration of risks, benefits and other options for treatment, the patient has consented to  Procedure(s): RIGHT/LEFT HEART CATH AND CORONARY ANGIOGRAPHY (N/A) and possible coronary angioplasty as a surgical intervention.  The patient's history has been reviewed, patient examined, no change in status, stable for surgery.  I have reviewed the patient's chart and labs.  Questions were answered to the patient's satisfaction.     Donasia Wimes

## 2022-03-25 DIAGNOSIS — J9601 Acute respiratory failure with hypoxia: Secondary | ICD-10-CM | POA: Diagnosis not present

## 2022-03-25 DIAGNOSIS — I498 Other specified cardiac arrhythmias: Secondary | ICD-10-CM | POA: Diagnosis not present

## 2022-03-25 DIAGNOSIS — I50812 Chronic right heart failure: Secondary | ICD-10-CM | POA: Diagnosis not present

## 2022-03-25 LAB — GLUCOSE, CAPILLARY
Glucose-Capillary: 123 mg/dL — ABNORMAL HIGH (ref 70–99)
Glucose-Capillary: 153 mg/dL — ABNORMAL HIGH (ref 70–99)

## 2022-03-25 LAB — BASIC METABOLIC PANEL
BUN: 49 mg/dL — ABNORMAL HIGH (ref 8–23)
CO2: >45 mmol/L — ABNORMAL HIGH (ref 22–32)
Calcium: 9.3 mg/dL (ref 8.9–10.3)
Chloride: 82 mmol/L — ABNORMAL LOW (ref 98–111)
Creatinine, Ser: 1.37 mg/dL — ABNORMAL HIGH (ref 0.61–1.24)
GFR, Estimated: 54 mL/min — ABNORMAL LOW (ref >60)
Glucose, Bld: 90 mg/dL (ref 70–99)
Potassium: 4.4 mmol/L (ref 3.5–5.1)
Sodium: 142 mmol/L (ref 135–145)

## 2022-03-25 MED ORDER — PREDNISONE 10 MG PO TABS: ORAL_TABLET | ORAL | 0 refills | Status: AC

## 2022-03-25 MED ORDER — METOPROLOL SUCCINATE ER 25 MG PO TB24: 12.5000 mg | ORAL_TABLET | Freq: Every day | ORAL | 0 refills | Status: DC

## 2022-03-25 NOTE — TOC Progression Note (Signed)
Transition of Care [facility]) - Progression Note   Patient Details  Name: Brian Parsons MRN: [id number] Date of Birth: Oct 20, 1950  [facility]) CM/SW Contact  Tylynn Braniff G., RN Phone Number: 03/25/2022, 9:13 AM  Clinical Narrative:     Patient's HH already set up as noted.  Jasmine with [employer] updated on new Oxygen order.  Expected Discharge Plan: [place] Barriers to Discharge: Continued Medical Work up  Expected Discharge Plan and Services Expected Discharge Plan: [place]   Discharge Planning Services: CM Consult Post Acute Care Choice: [place] arrangements for the past 2 months: Single Family Home Expected Discharge Date: 03/25/22               DME Arranged: Gilford Rile rolling DME Agency: [employer] Date DME Agency Contacted: 03/22/22 Time DME Agency Contacted: [phone] Representative spoke with at DME Agency: [place]: PT [place]: [place] Date [place]: 03/21/22 Time [place]: 37 Representative spoke with at [place]: Tommi Rumps  Social Determinants of Health ([place]) Interventions Food Insecurity Interventions: Intervention Not Indicated Housing Interventions: Intervention Not Indicated Transportation Interventions: Intervention Not Indicated, Inpatient TOC, Patient Resources (Friends/Family) Utilities Interventions: Intervention Not Indicated  Readmission Risk Interventions     No data to display

## 2022-03-25 NOTE — Progress Notes (Signed)
Pt dropped a pill on the floor and is unable to find it. Unsure which pill it was.

## 2022-03-25 NOTE — Plan of Care (Signed)
  Problem: Education: Goal: Knowledge of General Education information will improve Description: Including pain rating scale, medication(s)/side effects and non-pharmacologic comfort measures Outcome: Adequate for Discharge   Problem: Health Behavior/Discharge Planning: Goal: Ability to manage health-related needs will improve Outcome: Adequate for Discharge   Problem: Clinical Measurements: Goal: Ability to maintain clinical measurements within normal limits will improve Outcome: Adequate for Discharge Goal: Will remain free from infection Outcome: Adequate for Discharge Goal: Diagnostic test results will improve Outcome: Adequate for Discharge Goal: Respiratory complications will improve Outcome: Adequate for Discharge Goal: Cardiovascular complication will be avoided Outcome: Adequate for Discharge   Problem: Activity: Goal: Risk for activity intolerance will decrease Outcome: Adequate for Discharge   Problem: Nutrition: Goal: Adequate nutrition will be maintained Outcome: Adequate for Discharge   Problem: Coping: Goal: Level of anxiety will decrease Outcome: Adequate for Discharge   Problem: Elimination: Goal: Will not experience complications related to bowel motility Outcome: Adequate for Discharge Goal: Will not experience complications related to urinary retention Outcome: Adequate for Discharge   Problem: Pain Managment: Goal: General experience of comfort will improve Outcome: Adequate for Discharge   Problem: Safety: Goal: Ability to remain free from injury will improve Outcome: Adequate for Discharge   Problem: Skin Integrity: Goal: Risk for impaired skin integrity will decrease Outcome: Adequate for Discharge   Problem: Activity: Goal: Ability to tolerate increased activity will improve Outcome: Adequate for Discharge   Problem: Respiratory: Goal: Ability to maintain a clear airway and adequate ventilation will improve Outcome: Adequate for  Discharge   Problem: Role Relationship: Goal: Method of communication will improve Outcome: Adequate for Discharge   Problem: Education: Goal: Understanding of CV disease, CV risk reduction, and recovery process will improve Outcome: Adequate for Discharge Goal: Individualized Educational Video(s) Outcome: Adequate for Discharge   Problem: Activity: Goal: Ability to return to baseline activity level will improve Outcome: Adequate for Discharge   Problem: Cardiovascular: Goal: Ability to achieve and maintain adequate cardiovascular perfusion will improve Outcome: Adequate for Discharge Goal: Vascular access site(s) Level 0-1 will be maintained Outcome: Adequate for Discharge   Problem: Health Behavior/Discharge Planning: Goal: Ability to safely manage health-related needs after discharge will improve Outcome: Adequate for Discharge   Problem: Education: Goal: Ability to describe self-care measures that may prevent or decrease complications (Diabetes Survival Skills Education) will improve Outcome: Adequate for Discharge Goal: Individualized Educational Video(s) Outcome: Adequate for Discharge   Problem: Coping: Goal: Ability to adjust to condition or change in health will improve Outcome: Adequate for Discharge   Problem: Fluid Volume: Goal: Ability to maintain a balanced intake and output will improve Outcome: Adequate for Discharge   Problem: Health Behavior/Discharge Planning: Goal: Ability to identify and utilize available resources and services will improve Outcome: Adequate for Discharge Goal: Ability to manage health-related needs will improve Outcome: Adequate for Discharge   Problem: Metabolic: Goal: Ability to maintain appropriate glucose levels will improve Outcome: Adequate for Discharge   Problem: Nutritional: Goal: Maintenance of adequate nutrition will improve Outcome: Adequate for Discharge Goal: Progress toward achieving an optimal weight will  improve Outcome: Adequate for Discharge   Problem: Skin Integrity: Goal: Risk for impaired skin integrity will decrease Outcome: Adequate for Discharge   Problem: Tissue Perfusion: Goal: Adequacy of tissue perfusion will improve Outcome: Adequate for Discharge

## 2022-03-25 NOTE — TOC Progression Note (Signed)
Transition of Care [facility]) - Progression Note    Patient Details  Name: Brian Parsons MRN: [id number] Date of Birth: 03-19-1951  Transition of Care [facility]) CM/SW Contact  Aubriee Szeto G., RN Phone Number: 03/25/2022, 9:42 AM  Clinical Narrative:       Expected Discharge Plan: [place] Barriers to Discharge: Continued Medical Work up  Expected Discharge Plan and Services Expected Discharge Plan: [place]   Discharge Planning Services: CM Consult Post Acute Care Choice: [place] arrangements for the past 2 months: Single Family Home Expected Discharge Date: 03/25/22               DME Arranged: Gilford Rile rolling DME Agency: [employer] Date DME Agency Contacted: 03/22/22 Time DME Agency Contacted: [phone] Representative spoke with at DME Agency: [place]: PT [place]: [place] Date [place]: 03/21/22 Time [place]: 72 Representative spoke with at [place]: Tommi Rumps   Social Determinants of Health (SDOH) Interventions Food Insecurity Interventions: Intervention Not Indicated Housing Interventions: Intervention Not Indicated Transportation Interventions: Intervention Not Indicated, Inpatient TOC, Patient Resources (Friends/Family) Utilities Interventions: Intervention Not Indicated  Readmission Risk Interventions     No data to display

## 2022-03-25 NOTE — Discharge Summary (Signed)
Physician Discharge Summary   Patient: Brian Parsons MRN: [id number] DOB: 07/19/1950  Admit date:     03/19/2022  Discharge date: 03/25/22  Discharge Physician: Brian Parsons   PCP: Brian Schwalbe, MD   Recommendations at discharge:  Follow up with PCP in 1-2 weeks with recheck CBC, BMP Follow up with [facility] cardiology in the next 1-2 weeks. Note change from coreg to metoprolol succinate.  Consider outpatient MRI with and without contrast for further investigation of pancreatic nodule incidentally noted on imaging this admission.   Discharge Diagnoses: Principal Problem:   Acute respiratory failure with hypoxia (HCC) Active Problems:   Malnutrition of moderate degree   Chronic right-sided heart failure (HCC)   Atrial arrhythmia   Abnormal ECG   Peripheral edema  Hospital Course: Brian Parsons is a 71 y.o. 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 He was found to be positive for influenza and treated for AECOPD, AKI, and started on anticoagulation for possible new diagnosis of atrial fibrillation. Ultimately weaned to BiPAP and transferred to medical floor on hospitalist service 12/6.   Assessment and Plan: Acute on chronic hypoxic respiratory failure: Due to AECOPD due to influenza infection:  - Weaned oxygen to 4LPM (PTA baseline 2-3LPM) with normal respiratory effort and sustained improvement. He has maximized benefit of hospitalization and is ambulating without difficulty. Will discharge on 4L until he follows up with pulmonary, Brian Parsons.  Blood gases 12/8 confirmed CO2 retention with compensation to normal pH. - Treat conditions below   AECOPD:  - Wheezing has now resolved, though  baseline COPD is severe. Continue steroids with 10 day taper at discharge. Continue scheduled and prn nebs at home. Completed azithromycin x3 days. - Cardioselective BB as below   Influenza:  - Completed tamiflu x5 days renally dosed and augmented dosing with improvement in renal function (12/5 - 12/9)   AKI on stage IIIb CKD: Resolved AKI, further improved after restarting diuretics.  - Avoid nephrotoxins, stable after contrast with cath. Suggest recheck at follow up.   MAT, frequent PVCs: Cardiology [facility]) as outpatient notes zio patch showed MAT. I don't see definite atrial fibrillation, cardiology agrees. Eliquis stopped. Wandering atrial pacemaker noted, some PACs as well.  - Monitor K and Mg, both replete. - Started metoprolol (home coreg less favored with bronchospasm)   New HFrEF, chronic HFpEF, cor pulmonale, nonobstructive CAD: Echo this admit with poor windows on vent, septal flattening consistent with RV volume/pressure overload, and mod-severely reduced LVEF, global hypokinesis.  - No RWMA, and cath showed nonobstructive disease. LV function appears to be about 30%, difficult estimate due to ectopy. Moderate PAH with moderately reduced CO and PAPI 2.1 suggestive of RV dysfunction. Medical therapy recommended.  - Added metoprolol succinate. - AKI/CKD currently limiting ACE/ARB/ARNI/spironolactone, though creatinine is improving. - Continue torsemide '80mg'$  po BID, metolazone QOD  - Continue ASA, statin.  - Will follow up with his cardiologist after discharge.   Secondary polycythemia: Due to chronic hypoxia. No Tx indicated at this time.   Thrombocytopenia: Monitor at follow up, mild without bleeding.    Shock related to sedation: Resolved.    Question of foreign body on laryngoscopy: Not seen on reevaluation or imaging.    Moderate protein calorie malnutrition:  -  Supp protein   Pancreatic nodule: 26m low attenuation on NCCT.  - Consider outpatient MRI with and without  contrast.   Consultants: PCCM Cardiology Procedures performed: Cardiac catheterization 12/8 Conclusion      Mid RCA lesion is 20% stenosed.   Dist RCA lesion is 20% stenosed.   3rd Mrg lesion is 40% stenosed.   Ost 1st Mrg to 1st Mrg lesion is 40% stenosed.   1st Mrg lesion is 40% stenosed.   Mid LAD lesion is 20% stenosed.   Findings:   Ao = 115/80 (89) LV = 120/19 RA =  8 RV = 45/8 PA = 51/34 (35) PCW = 14 Fick cardiac output/index = 2.3/2.0 Thermo CO/CI = 4.7/2.2 PVR = 4.9 (Fick) Ao sat = 87% PA sat = 58%, 62% PAPi = 2.1   Assessment: 1. Mild non-obstructive CAD 2. LV function hard to assess due to ectopy, appears about 30%  3. Moderate PAH with moderately reduced CO and PAPI 2.1 suggestive of RV dysfunction   Plan/Discussion:    Medical therapy. Can sider echo with Definity to better assess LV function    Brian Bickers MD  10:46 AM  Disposition: Home Diet recommendation:  Cardiac and Carb modified diet DISCHARGE MEDICATION: Allergies as of 03/25/2022       Reactions   Amoxicillin Hives   Other reaction(s): rash   Other    Other reaction(s): Unknown   Penicillin G    Other reaction(s): rash   Penicillins Hives   Has patient had a PCN reaction causing immediate rash, facial/tongue/throat swelling, SOB or lightheadedness with hypotension: Unknown Has patient had a PCN reaction causing severe rash involving mucus membranes or skin necrosis: No Has patient had a PCN reaction that required hospitalization No Has patient had a PCN reaction occurring within the last 10 years: No If all of the above answers are "NO", then may proceed with Cephalosporin use.        Medication List     STOP taking these medications    carvedilol 3.125 MG tablet Commonly known as: COREG       TAKE these medications    albuterol 108 (90 Base) MCG/ACT inhaler Commonly known as: VENTOLIN HFA Inhale 2 puffs into the lungs every 6 (six) hours as needed for wheezing  or shortness of breath.   albuterol 0.63 MG/3ML nebulizer solution Commonly known as: ACCUNEB Take 3 mLs (0.63 mg total) by nebulization every 4 (four) hours as needed.   allopurinol 100 MG tablet Commonly known as: ZYLOPRIM Take 200 mg by mouth 2 (two) times daily.   aspirin EC 81 MG tablet Take 81 mg by mouth daily.   atorvastatin 20 MG tablet Commonly known as: LIPITOR Take 20 mg by mouth daily at 6 PM.   azelastine 0.1 % nasal spray Commonly known as: ASTELIN 1 puff in each nostril   cyanocobalamin 1000 MCG tablet Commonly known as: VITAMIN B12 Take 1,000 mcg by mouth daily.   DULoxetine 30 MG capsule Commonly known as: CYMBALTA Take 30 mg by mouth daily.   escitalopram 20 MG tablet Commonly known as: LEXAPRO Take 20 mg by mouth daily.   fluticasone 50 MCG/ACT nasal spray Commonly known as: FLONASE 1 spray in each nostril   ipratropium-albuterol 0.5-2.5 (3) MG/3ML Soln Commonly known as: DUONEB Take by nebulization.   Linzess 145 MCG Caps capsule Generic drug: linaclotide Take 145 mcg by mouth daily.   Magnesium Gluconate 500 (27 Mg) MG Tabs Take 500 mg by mouth in the  morning and at bedtime.   metolazone 2.5 MG tablet Commonly known as: ZAROXOLYN Take 2.5 mg by mouth every other day.   metoprolol succinate 25 MG 24 hr tablet Commonly known as: TOPROL-XL Take 0.5 tablets (12.5 mg total) by mouth daily. Start taking on: March 26, 2022   potassium chloride 10 MEQ CR capsule Commonly known as: MICRO-K TAKE 1 CAPSULE BY MOUTH ONCE DAILY WITH FOOD   predniSONE 10 MG tablet Commonly known as: DELTASONE Take 4 tablets (40 mg total) by mouth daily with breakfast for 2 days, THEN 3 tablets (30 mg total) daily with breakfast for 2 days, THEN 2 tablets (20 mg total) daily with breakfast for 2 days, THEN 1 tablet (10 mg total) daily with breakfast for 4 days. Start taking on: March 26, 2022   sildenafil 50 MG tablet Commonly known as: VIAGRA Take by  mouth.   torsemide 20 MG tablet Commonly known as: DEMADEX Take 80 mg by mouth 2 (two) times daily.   Trelegy Ellipta 100-62.5-25 MCG/ACT Aepb Generic drug: Fluticasone-Umeclidin-Vilant One click each am   True Metrix Meter Devi See admin instructions.               Durable Medical Equipment  (From admission, onward)           Start     Ordered   03/25/22 0851  For home use only DME oxygen  Once       Question Answer Comment  Length of Need Lifetime   Mode or (Route) Nasal cannula   Liters per Minute 4   Oxygen delivery system Gas      03/25/22 0850   03/21/22 1520  For home use only DME Walker rolling  Once       Question Answer Comment  Walker: With [place] Wheels   Patient needs a walker to treat with the following condition Gait instability      03/21/22 1519            Follow-up Information     [facility] Follow up.   Specialty: Home Health Services Why: Someone will call you to schedule first home visit. Contact information: [place] [address] [phone]         Brian Schwalbe, MD Follow up.   Specialty: Family Medicine Contact information: [address] [place] [address] [phone]         Creta Levin, MD Follow up.   Specialty: Cardiology Contact information: [id number] [place] [address] [phone]         Tanda Rockers, MD Follow up.   Specialty: Pulmonary Disease Contact information: [address] [place] [address] [phone]                Discharge Exam: Danley Danker Weights   03/21/22 0500 03/22/22 0418 03/23/22 0323  Weight: 96.6 kg 88.4 kg 87.5 kg  BP (!) 109/56   Pulse 81   Temp 98 F (36.7 C) (Oral)   Resp (!) 21   Ht 6' (1.829 m)   Wt 87.5 kg   SpO2 100%   BMI 26.16 kg/m   Chronically ill-appearing but pleasant alert and interactive male in no distress Regular with frequent premature  beats, no murmur or S3, no significant edema Diminished but no longer wheezing, no crackles, nonlabored on 4L O2, SpO2 98%  Condition at discharge: stable  The results of significant diagnostics  from this hospitalization (including imaging, microbiology, ancillary and laboratory) are listed below for reference.   Imaging Studies: CARDIAC CATHETERIZATION  Result Date: 03/24/2022   Mid RCA lesion is 20% stenosed.   Dist RCA lesion is 20% stenosed.   3rd Mrg lesion is 40% stenosed.   Ost 1st Mrg to 1st Mrg lesion is 40% stenosed.   1st Mrg lesion is 40% stenosed.   Mid LAD lesion is 20% stenosed. Findings: Ao = 115/80 (89) LV = 120/19 RA =  8 RV = 45/8 PA = 51/34 (35) PCW = 14 Fick cardiac output/index = 2.3/2.0 Thermo CO/CI = 4.7/2.2 PVR = 4.9 (Fick) Ao sat = 87% PA sat = 58%, 62% PAPi = 2.1 Assessment: 1. Mild non-obstructive CAD 2. LV function hard to assess due to ectopy, appears about 30% 3. Moderate PAH with moderately reduced CO and PAPI 2.1 suggestive of RV dysfunction Plan/Discussion: Medical therapy. Can sider echo with Definity to better assess LV function Glori Bickers, MD 10:46 AM  ECHOCARDIOGRAM COMPLETE  Result Date: 03/20/2022    ECHOCARDIOGRAM REPORT   Patient Name:   Brian Parsons Date of Exam: 03/20/2022 Medical Rec #:  [id number]     Height:       72.0 in Accession #:    [id number]    Weight:       212.3 lb Date of Birth:  [birth year]     BSA:          2.185 m Patient Age:    48 years      BP:           121/61 mmHg Patient Gender: M             HR:           85 bpm. Exam Location:  Inpatient Procedure: 2D Echo, Cardiac Doppler, Color Doppler and Intracardiac            Opacification Agent Indications:    Elevated troponin  History:        Patient has prior history of Echocardiogram examinations, most                 recent 04/30/2016. CAD; Risk Factors:Current Smoker and Diabetes.                 HFpEF.  Sonographer:    Clayton Lefort RDCS (AE) Referring Phys: [id number] Royanne Foots [facility]   Sonographer Comments: Technically challenging study due to limited acoustic windows, Technically difficult study due to poor echo windows, suboptimal parasternal window, suboptimal apical window, suboptimal subcostal window and echo performed with patient supine and on artificial respirator. Image acquisition challenging due to COPD. IMPRESSIONS  1. Extremely poor acoustic windows. Definity used. LV function appears moderate to severely depressed with diffuse hypokinesis. Septal flattening consistent with RV volume / pressure overload. . There is mild concentric left ventricular hypertrophy.  2. Right ventricular systolic function is severely reduced. The right ventricular size is moderately enlarged. Mildly increased right ventricular wall thickness.  3. Right atrial size was moderately dilated.  4. MV leaflets thickened, with myxomatous appearance . Mild mitral valve regurgitation.  5. The aortic valve is tricuspid. Aortic valve regurgitation is not visualized. Aortic valve sclerosis/calcification is present, without any evidence of aortic stenosis.  6. The inferior vena cava is normal in size with <50% respiratory variability, suggesting right atrial pressure of 8 mmHg. FINDINGS  Left Ventricle: Extremely poor acoustic windows. Definity used. LV function appears moderate to severely depressed with diffuse hypokinesis.  Septal flattening consistent with RV volume / pressure overload. Definity contrast agent was given IV to delineate the left ventricular endocardial borders. The left ventricular internal cavity size was normal in size. There is mild concentric left ventricular hypertrophy. Right Ventricle: The right ventricular size is moderately enlarged. Mildly increased right ventricular wall thickness. Right ventricular systolic function is severely reduced. Left Atrium: Left atrial size was normal in size. Right Atrium: Right atrial size was moderately dilated. Pericardium: There is no evidence of pericardial  effusion. Mitral Valve: MV leaflets thickened, with myxomatous appearance. There is mild thickening of the mitral valve leaflet(s). Mild mitral valve regurgitation. Tricuspid Valve: The tricuspid valve is normal in structure. Tricuspid valve regurgitation is trivial. Aortic Valve: The aortic valve is tricuspid. Aortic valve regurgitation is not visualized. Aortic valve sclerosis/calcification is present, without any evidence of aortic stenosis. Aortic valve mean gradient measures 3.0 mmHg. Aortic valve peak gradient measures 5.6 mmHg. Aortic valve area, by VTI measures 1.61 cm. Pulmonic Valve: The pulmonic valve was not well visualized. Pulmonic valve regurgitation is not visualized. Aorta: The aortic root is normal in size and structure. Venous: The inferior vena cava is normal in size with less than 50% respiratory variability, suggesting right atrial pressure of 8 mmHg. IAS/Shunts: No atrial level shunt detected by color flow Doppler.  LEFT VENTRICLE PLAX 2D LVIDd:         4.50 cm LVIDs:         3.70 cm LV PW:         1.30 cm LV IVS:        1.20 cm LVOT diam:     2.00 cm LV SV:         35 LV SV Index:   16 LVOT Area:     3.14 cm  RIGHT VENTRICLE            IVC RV Basal diam:  4.50 cm    IVC diam: 1.80 cm RV Mid diam:    4.50 cm RV S prime:     8.05 cm/s TAPSE (M-mode): 2.1 cm LEFT ATRIUM           Index        RIGHT ATRIUM           Index LA diam:      3.10 cm 1.42 cm/m   RA Area:     27.10 cm LA Vol (A4C): 35.0 ml 16.02 ml/m  RA Volume:   85.70 ml  39.22 ml/m  AORTIC VALVE AV Area (Vmax):    1.74 cm AV Area (Vmean):   1.65 cm AV Area (VTI):     1.61 cm AV Vmax:           118.00 cm/s AV Vmean:          79.567 cm/s AV VTI:            0.220 m AV Peak Grad:      5.6 mmHg AV Mean Grad:      3.0 mmHg LVOT Vmax:         65.47 cm/s LVOT Vmean:        41.900 cm/s LVOT VTI:          0.113 m LVOT/AV VTI ratio: 0.51  AORTA Ao Root diam: 3.30 cm  SHUNTS Systemic VTI:  0.11 m Systemic Diam: 2.00 cm Dorris Carnes MD  Electronically signed by Dorris Carnes MD Signature Date/Time: 03/20/2022/12:34:56 PM    Final    CT SOFT TISSUE NECK  WO CONTRAST  Result Date: 03/20/2022 CLINICAL DATA:  Swallowed foreign body. EXAM: CT NECK WITHOUT CONTRAST TECHNIQUE: Multidetector CT imaging of the neck was performed following the standard protocol without intravenous contrast. RADIATION DOSE REDUCTION: This exam was performed according to the departmental dose-optimization program which includes automated exposure control, adjustment of the mA and/or kV according to patient size and/or use of iterative reconstruction technique. COMPARISON:  None Available. FINDINGS: Pharynx and larynx: Presence of endotracheal and enteric tubes limits assessment. Within this limitation, no radiopaque foreign body is visualized. There are large periapical lucencies along mandibular symphysis (series 3, image 54). Along the anterior margin of the mandibular symphysis there is a focal area of lucency (series 3, image 60), which is nonspecific, but infection is not excluded. Salivary glands: No inflammation, mass, or stone. Thyroid: Normal. Lymph nodes: None enlarged or abnormal density. Vascular: Negative. Limited intracranial: Negative. Visualized orbits: Negative. Mastoids and visualized paranasal sinuses: Hypoplastic mastoid sinuses. There is mild mucosal thickening in the right maxillary sinus with osseous findings suggestive of chronic sinusitis. Skeleton: No acute or aggressive process. There are degenerative changes in the cervical spine with grade 1 anterolisthesis of C4 on C5. Upper chest: Negative. Other: None. IMPRESSION: 1. No radiopaque foreign body is visualized. 2. Large periapical lucencies along the mandibular symphysis with a possible area of soft tissue infection along the midline chin. Correlate with physical exam. Electronically Signed   By: Marin Roberts M.D.   On: 03/20/2022 09:49   CT HEAD WO CONTRAST (5MM)  Result Date:  03/20/2022 CLINICAL DATA:  Meningitis/Central nervous system infection suspected. Obtundation in ER. EXAM: CT HEAD WITHOUT CONTRAST TECHNIQUE: Contiguous axial images were obtained from the base of the skull through the vertex without intravenous contrast. RADIATION DOSE REDUCTION: This exam was performed according to the departmental dose-optimization program which includes automated exposure control, adjustment of the mA and/or kV according to patient size and/or use of iterative reconstruction technique. COMPARISON:  No prior brain imaging is available for comparison. FINDINGS: Brain: There is mild-to-moderate cortical atrophy, within normal limits for patient age. The ventricles are normal in configuration. The basilar cisterns are patent. No mass, mass effect, or midline shift. No acute intracranial hemorrhage is seen. No abnormal extra-axial fluid collection. There is mild periventricular white matter patchy hypodensity, greatest around the anterior horns of the lateral ventricles. This is nonspecific but most likely secondary to chronic ischemic white matter changes. Otherwise, preservation of the normal cortical gray-white interface without CT evidence of an acute major vascular territorial cortical based infarction. Vascular: No hyperdense vessel or unexpected calcification. There are atherosclerotic intracranial calcifications within the skull base arteries. Skull: There is mild diastasis of the anterior aspect of the frontal suture, measuring up to approximately 15 mm in maximum dimension (axial series 5, image 51). This appears chronic. 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 The bilateral mastoid air cells are incompletely aerated, likely developmental. There may be mild stenosis of the bilateral external auditory canals related to hypertrophic surrounding bone, also likely developmental. Sinuses/Orbits: The visualized orbits are unremarkable.  The frontal sutures are developmentally non aerated. Moderate mucosal thickening of the ethmoid air cells. Mild peripheral mucosal opacification of the partially visualized maxillary sinuses. The sphenoid sinuses are mildly developmentally hypoplastic. Mild-to-moderate peripheral mucosal opacification within the right aspect of the sphenoid sinus. Other: There is developmental non-fusion of the anterior and posterior midline aspects of the C1 vertebral body. IMPRESSION: 1. No CT evidence  of acute intracranial abnormality. Please note that this noncontrast CT modality has limited sensitivity for detection of meningitis, as classic meningitis findings include increased enhancement of the leptomeninges. 2. Mild-to-moderate cortical atrophy, within normal limits for patient age. 3. Mild periventricular white matter patchy hypodensity, nonspecific but most likely secondary to chronic ischemic white matter changes. 4. Mild diastasis of the anterior aspect of the frontal suture, which appears chronic and developmental. 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 5. 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 6. Developmental incomplete aeration of the mastoid air cells. There may be mild stenosis of the bilateral external auditory canals related to hypertrophic surrounding bone, also likely developmental. Electronically Signed   By: Yvonne Kendall M.D.   On: 03/20/2022 09:48   CT CHEST WO CONTRAST  Result Date: 03/20/2022 CLINICAL DATA:  Foreign body suspected. EXAM: CT CHEST AND ABDOMEN WITHOUT CONTRAST TECHNIQUE: Multidetector CT imaging of the chest and abdomen was performed following the standard protocol without intravenous contrast. RADIATION DOSE REDUCTION: This exam was performed according to the departmental dose-optimization program which includes automated exposure control, adjustment of the mA and/or kV  according to patient size and/or use of iterative reconstruction technique. COMPARISON:  Earlier chest and abdominal radiograph dated 03/19/2022. FINDINGS: Evaluation of this exam is limited in the absence of intravenous contrast. CT CHEST FINDINGS WITHOUT CONTRAST Cardiovascular: There is no cardiomegaly or pericardial effusion. Three-vessel coronary vascular calcification. Mild atherosclerotic calcification of the thoracic aorta. No aneurysmal dilatation or dissection. Mild dilatation of the main pulmonary trunk may represent a degree of pulmonary hypertension. Clinical correlation recommended. Mediastinum/Nodes: Mildly enlarged subcarinal lymph node measures 14 mm in short axis. No hilar adenopathy. Evaluation however is limited in the absence of intravenous contrast. An enteric tube noted within the esophagus. No mediastinal fluid collection. Lungs/Pleura: Bibasilar linear and streaky atelectasis. Pneumonia is not excluded. Clinical correlation is recommended. Background of centrilobular emphysema. No consolidative changes. There is no pleural effusion or pneumothorax. The central airways are patent. Endotracheal tube with tip approximately 3.5 cm above the carina. Musculoskeletal: No acute osseous pathology. CT ABDOMEN FINDINGS WITHOUT CONTRAST No intra-abdominal free air or free fluid. Hepatobiliary: The liver is unremarkable. Probable sludge within the gallbladder. No calcified gallstone or pericholecystic fluid. Pancreas: Apparent 14 mm low attenuating nodule arising from the distal body of the pancreas (coronal 44/513) not evaluated on this noncontrast CT. This may represent a nodular pancreatic parenchyma or a pancreatic lesion such as a side branch IPMN. Further characterization with MRI without and with contrast on a nonemergent/outpatient basis recommended. No active inflammatory changes of the pancreas. No dilatation of the main pancreatic duct. Spleen: Normal in size without focal abnormality.  Adrenals/Urinary Tract: The adrenal glands are unremarkable. There is no hydronephrosis or nephrolithiasis on either side. The visualized ureters appear unremarkable. Stomach/Bowel: Enteric tube with tip in the region of the proximal duodenum. The stomach is not distended. No radiopaque foreign object noted within the stomach. There is moderate stool throughout the visualized colon. No bowel dilatation. Vascular/Lymphatic: Mild aortoiliac atherosclerotic disease. The IVC is unremarkable. No portal venous gas. There is no adenopathy. Other: None Musculoskeletal: Degenerative changes of the spine. No acute osseous pathology. IMPRESSION: 1. Bibasilar linear and streaky atelectasis. Pneumonia is not excluded. 2. No acute intra-abdominal pathology. No radiopaque foreign object noted within the stomach. 3. Apparent 14 mm low attenuating nodule arising from the distal body of the  pancreas not evaluated on this noncontrast CT. Further characterization with MRI without and with contrast on a nonemergent/outpatient basis recommended. 4. Moderate stool throughout the visualized colon. No bowel dilatation. 5. Aortic Atherosclerosis (ICD10-I70.0) and Emphysema (ICD10-J43.9). Electronically Signed   By: Anner Crete M.D.   On: 03/20/2022 00:47   CT ABDOMEN LIMITED WO CONTRAST  Result Date: 03/20/2022 CLINICAL DATA:  Foreign body suspected. EXAM: CT CHEST AND ABDOMEN WITHOUT CONTRAST TECHNIQUE: Multidetector CT imaging of the chest and abdomen was performed following the standard protocol without intravenous contrast. RADIATION DOSE REDUCTION: This exam was performed according to the departmental dose-optimization program which includes automated exposure control, adjustment of the mA and/or kV according to patient size and/or use of iterative reconstruction technique. COMPARISON:  Earlier chest and abdominal radiograph dated 03/19/2022. FINDINGS: Evaluation of this exam is limited in the absence of intravenous contrast. CT  CHEST FINDINGS WITHOUT CONTRAST Cardiovascular: There is no cardiomegaly or pericardial effusion. Three-vessel coronary vascular calcification. Mild atherosclerotic calcification of the thoracic aorta. No aneurysmal dilatation or dissection. Mild dilatation of the main pulmonary trunk may represent a degree of pulmonary hypertension. Clinical correlation recommended. Mediastinum/Nodes: Mildly enlarged subcarinal lymph node measures 14 mm in short axis. No hilar adenopathy. Evaluation however is limited in the absence of intravenous contrast. An enteric tube noted within the esophagus. No mediastinal fluid collection. Lungs/Pleura: Bibasilar linear and streaky atelectasis. Pneumonia is not excluded. Clinical correlation is recommended. Background of centrilobular emphysema. No consolidative changes. There is no pleural effusion or pneumothorax. The central airways are patent. Endotracheal tube with tip approximately 3.5 cm above the carina. Musculoskeletal: No acute osseous pathology. CT ABDOMEN FINDINGS WITHOUT CONTRAST No intra-abdominal free air or free fluid. Hepatobiliary: The liver is unremarkable. Probable sludge within the gallbladder. No calcified gallstone or pericholecystic fluid. Pancreas: Apparent 14 mm low attenuating nodule arising from the distal body of the pancreas (coronal 44/513) not evaluated on this noncontrast CT. This may represent a nodular pancreatic parenchyma or a pancreatic lesion such as a side branch IPMN. Further characterization with MRI without and with contrast on a nonemergent/outpatient basis recommended. No active inflammatory changes of the pancreas. No dilatation of the main pancreatic duct. Spleen: Normal in size without focal abnormality. Adrenals/Urinary Tract: The adrenal glands are unremarkable. There is no hydronephrosis or nephrolithiasis on either side. The visualized ureters appear unremarkable. Stomach/Bowel: Enteric tube with tip in the region of the proximal  duodenum. The stomach is not distended. No radiopaque foreign object noted within the stomach. There is moderate stool throughout the visualized colon. No bowel dilatation. Vascular/Lymphatic: Mild aortoiliac atherosclerotic disease. The IVC is unremarkable. No portal venous gas. There is no adenopathy. Other: None Musculoskeletal: Degenerative changes of the spine. No acute osseous pathology. IMPRESSION: 1. Bibasilar linear and streaky atelectasis. Pneumonia is not excluded. 2. No acute intra-abdominal pathology. No radiopaque foreign object noted within the stomach. 3. Apparent 14 mm low attenuating nodule arising from the distal body of the pancreas not evaluated on this noncontrast CT. Further characterization with MRI without and with contrast on a nonemergent/outpatient basis recommended. 4. Moderate stool throughout the visualized colon. No bowel dilatation. 5. Aortic Atherosclerosis (ICD10-I70.0) and Emphysema (ICD10-J43.9). Electronically Signed   By: Anner Crete M.D.   On: 03/20/2022 00:47   DG Neck Soft Tissue  Result Date: 03/20/2022 CLINICAL DATA:  [id number], [id number].  Foreign body check. EXAM: NECK SOFT TISSUES - 1+ VIEW COMPARISON:  None Available. FINDINGS: There is no evidence of retropharyngeal soft tissue swelling. The epiglottis  is obscured due to orotracheal and oroenteric intubation. Calcifications are present in the arytenoid cartilage complex but there is no visible foreign body. There are degenerative changes of cervical spine greatest at C5-6. 3 mm C4-5 anterolisthesis is probably degenerative in etiology. IMPRESSION: 1. No visible foreign body. 2. The epiglottis is obscured due to orotracheal and oroenteric intubation. 3. Degenerative changes of the cervical spine greatest at C5-6. Electronically Signed   By: Telford Nab M.D.   On: 03/20/2022 00:18   DG Abd 1 View  Result Date: 03/20/2022 CLINICAL DATA:  Concern for foreign body. EXAM: ABDOMEN - 1 VIEW COMPARISON:  Chest  radiograph dated 03/19/2022. FINDINGS: No radiopaque foreign object identified. Partially visualized enteric tube with tip in the distal stomach. No bowel dilatation or evidence of obstruction. No free air or radiopaque calculi. The osseous structures are intact the soft tissues are unremarkable. IMPRESSION: No radiopaque foreign object identified. Enteric tube in the distal stomach. Electronically Signed   By: Anner Crete M.D.   On: 03/20/2022 00:08   Portable Chest x-ray  Result Date: 03/19/2022 CLINICAL DATA:  Endotracheal tube EXAM: PORTABLE CHEST 1 VIEW COMPARISON:  Chest x-ray 03/19/2022 FINDINGS: Endotracheal tube tip is 3 cm above the carina. Enteric tube tip is in the distal stomach. The heart is enlarged, unchanged. There is some minimal patchy opacities in both lung bases, unchanged. There is no pleural effusion or pneumothorax. No acute fractures are seen. IMPRESSION: 1. Endotracheal tube tip is 3 cm above the carina. 2. Minimal patchy opacities in both lung bases, unchanged. Electronically Signed   By: Ronney Asters M.D.   On: 03/19/2022 19:06   DG Chest Port 1 View  Result Date: 03/19/2022 CLINICAL DATA:  Post intubation.  Foreign body in throat. EXAM: PORTABLE CHEST 1 VIEW COMPARISON:  Chest x-ray 03/19/2022 FINDINGS: Endotracheal tube tip is 2.6 cm above the carina. The cardiomediastinal silhouette is stable, the heart is enlarged. Central interstitial prominence persists. No focal lung infiltrate, pleural effusion or pneumothorax. No acute fractures. IMPRESSION: 1. Endotracheal tube tip is 2.6 cm above the carina. 2. Stable cardiomegaly and central interstitial prominence. Electronically Signed   By: Ronney Asters M.D.   On: 03/19/2022 15:26   DG Chest Port 1 View  Result Date: 03/19/2022 CLINICAL DATA:  Questionable sepsis. EXAM: PORTABLE CHEST 1 VIEW COMPARISON:  March 15, 2019 FINDINGS: Persistent cardiomegaly. The hila and mediastinum are unchanged. No pneumothorax.  Interstitial prominence is similar in the interval. Probable mild atelectasis at the right base. No other acute abnormalities. IMPRESSION: Findings are most consistent with cardiomegaly and pulmonary venous congestion/mild edema. An atypical infection is not excluded. Mild opacity in the right base is favored to represent atelectasis. Electronically Signed   By: Dorise Bullion III M.D.   On: 03/19/2022 13:28    Microbiology: Results for orders placed or performed during the hospital encounter of 03/19/22  Blood Culture (routine x 2)     Status: None   Collection Time: 03/19/22  1:10 PM   Specimen: Right Antecubital; Blood  Result Value Ref Range Status   Specimen Description   Final    RIGHT ANTECUBITAL BOTTLES DRAWN AEROBIC AND ANAEROBIC   Special Requests Blood Culture adequate volume  Final   Culture   Final    NO GROWTH 5 DAYS Performed at [facility], [address], [place], [place] [address]    Report Status 03/24/2022 FINAL  Final  Blood Culture (routine x 2)     Status: None  Collection Time: 03/19/22  1:14 PM   Specimen: BLOOD LEFT HAND  Result Value Ref Range Status   Specimen Description BLOOD LEFT HAND BOTTLES DRAWN AEROBIC ONLY  Final   Special Requests Blood Culture adequate volume  Final   Culture   Final    NO GROWTH 5 DAYS Performed at [facility], [address], [place], [place] [address]    Report Status 03/24/2022 FINAL  Final  SARS Coronavirus 2 by RT PCR (hospital order, performed in [facility] hospital lab) *cepheid single result test* Anterior Nasal Swab     Status: None   Collection Time: 03/19/22  1:35 PM   Specimen: Anterior Nasal Swab  Result Value Ref Range Status   SARS Coronavirus 2 by RT PCR NEGATIVE NEGATIVE Final    Comment: (NOTE) SARS-CoV-2 target nucleic acids are NOT DETECTED.  The SARS-CoV-2 RNA is generally detectable in upper and lower respiratory specimens during the acute phase of infection. The lowest concentration of SARS-CoV-2  viral copies this assay can detect is 250 copies / mL. A negative result does not preclude SARS-CoV-2 infection and should not be used as the sole basis for treatment or other patient management decisions.  A negative result may occur with improper specimen collection / handling, submission of specimen other than nasopharyngeal swab, presence of viral mutation(s) within the areas targeted by this assay, and inadequate number of viral copies (<250 copies / mL). A negative result must be combined with clinical observations, patient history, and epidemiological information.  Fact Sheet for Patients:   https://www.patel.info/  Fact Sheet for Healthcare Providers: https://hall.com/  This test is not yet approved or  cleared by the [place] FDA and has been authorized for detection and/or diagnosis of SARS-CoV-2 by FDA under an Emergency Use Authorization (EUA).  This EUA will remain in effect (meaning this test can be used) for the duration of the COVID-19 declaration under Section 564(b)(1) of the Act, 21 U.S.C. section 360bbb-3(b)(1), unless the authorization is terminated or revoked sooner.  Performed at [facility], [address], [place], [place] [address]   Resp Panel by RT-PCR (Flu A&B, Covid)     Status: Abnormal   Collection Time: 03/19/22  6:16 PM  Result Value Ref Range Status   SARS Coronavirus 2 by RT PCR NEGATIVE NEGATIVE Final    Comment: (NOTE) SARS-CoV-2 target nucleic acids are NOT DETECTED.  The SARS-CoV-2 RNA is generally detectable in upper respiratory specimens during the acute phase of infection. The lowest concentration of SARS-CoV-2 viral copies this assay can detect is 138 copies/mL. A negative result does not preclude SARS-Cov-2 infection and should not be used as the sole basis for treatment or other patient management decisions. A negative result may occur with  improper specimen collection/handling,  submission of specimen other than nasopharyngeal swab, presence of viral mutation(s) within the areas targeted by this assay, and inadequate number of viral copies(<138 copies/mL). A negative result must be combined with clinical observations, patient history, and epidemiological information. The expected result is Negative.  Fact Sheet for Patients:  EntrepreneurPulse.com.au  Fact Sheet for Healthcare Providers:  IncredibleEmployment.be  This test is no t yet approved or cleared by the [place] FDA and  has been authorized for detection and/or diagnosis of SARS-CoV-2 by FDA under an Emergency Use Authorization (EUA). This EUA will remain  in effect (meaning this test can be used) for the duration of the COVID-19 declaration under Section 564(b)(1) of the Act, 21 U.S.C.section 360bbb-3(b)(1), unless the authorization  is terminated  or revoked sooner.       Influenza A by PCR POSITIVE (A) NEGATIVE Final   Influenza B by PCR NEGATIVE NEGATIVE Final    Comment: (NOTE) The Xpert Xpress SARS-CoV-2/FLU/RSV plus assay is intended as an aid in the diagnosis of influenza from Nasopharyngeal swab specimens and should not be used as a sole basis for treatment. Nasal washings and aspirates are unacceptable for Xpert Xpress SARS-CoV-2/FLU/RSV testing.  Fact Sheet for Patients: EntrepreneurPulse.com.au  Fact Sheet for Healthcare Providers: IncredibleEmployment.be  This test is not yet approved or cleared by the [place] FDA and has been authorized for detection and/or diagnosis of SARS-CoV-2 by FDA under an Emergency Use Authorization (EUA). This EUA will remain in effect (meaning this test can be used) for the duration of the COVID-19 declaration under Section 564(b)(1) of the Act, 21 U.S.C. section 360bbb-3(b)(1), unless the authorization is terminated or revoked.  Performed at [facility],  [place] [address], [place], [place] [address]   MRSA Next Gen by PCR, Nasal     Status: None   Collection Time: 03/19/22  9:35 PM   Specimen: Nasal Mucosa; Nasal Swab  Result Value Ref Range Status   MRSA by PCR Next Gen NOT DETECTED NOT DETECTED Final    Comment: (NOTE) The GeneXpert MRSA Assay (FDA approved for NASAL specimens only), is one component of a comprehensive MRSA colonization surveillance program. It is not intended to diagnose MRSA infection nor to guide or monitor treatment for MRSA infections. Test performance is not FDA approved in patients less than 73 years old. Performed at [facility], [place] [address] [place], [place] [address]   Respiratory (~20 pathogens) panel by PCR     Status: Abnormal   Collection Time: 03/20/22  7:49 AM   Specimen: Nasopharyngeal Swab; Respiratory  Result Value Ref Range Status   Adenovirus NOT DETECTED NOT DETECTED Final   Coronavirus 229E NOT DETECTED NOT DETECTED Final    Comment: (NOTE) The Coronavirus on the Respiratory Panel, DOES NOT test for the novel  Coronavirus (2019 nCoV)    Coronavirus HKU1 NOT DETECTED NOT DETECTED Final   Coronavirus NL63 NOT DETECTED NOT DETECTED Final   Coronavirus OC43 NOT DETECTED NOT DETECTED Final   Metapneumovirus NOT DETECTED NOT DETECTED Final   Rhinovirus / Enterovirus NOT DETECTED NOT DETECTED Final   Influenza A H1 2009 DETECTED (A) NOT DETECTED Final   Influenza B NOT DETECTED NOT DETECTED Final   Parainfluenza Virus 1 NOT DETECTED NOT DETECTED Final   Parainfluenza Virus 2 NOT DETECTED NOT DETECTED Final   Parainfluenza Virus 3 NOT DETECTED NOT DETECTED Final   Parainfluenza Virus 4 NOT DETECTED NOT DETECTED Final   Respiratory Syncytial Virus NOT DETECTED NOT DETECTED Final   Bordetella pertussis NOT DETECTED NOT DETECTED Final   Bordetella Parapertussis NOT DETECTED NOT DETECTED Final   Chlamydophila pneumoniae NOT DETECTED NOT DETECTED Final   Mycoplasma pneumoniae NOT DETECTED NOT  DETECTED Final    Comment: Performed at [facility], [place], [place],  [address]    Labs: CBC: Recent Labs  Lab 03/19/22 1310 03/20/22 0052 03/20/22 0209 03/21/22 0711 03/22/22 0019 03/23/22 0032 03/24/22 0027 03/24/22 1026 03/24/22 1028 03/24/22 1034  WBC 14.9* 12.9*  --  9.0 9.2 7.9 9.0  --   --   --   NEUTROABS 12.1*  --   --   --   --   --   --   --   --   --  HGB 19.2* 18.7*   < > 17.3* 18.5* 17.2* 18.6* 16.7 18.0* 18.0*  HCT 61.6* 55.1*   < > 56.2* 59.6* 57.1* 58.6* 49.0 53.0* 53.0*  MCV 93.9 88.3  --  93.4 94.0 97.1 93.8  --   --   --   PLT 152 169  --  110* 120* 106* 106*  --   --   --    < > = values in this interval not displayed.   Basic Metabolic Panel: Recent Labs  Lab 03/21/22 0711 03/22/22 0115 03/23/22 0032 03/24/22 0027 03/24/22 1026 03/24/22 1028 03/24/22 1034 03/25/22 0011  NA 138 143 142 140 148* 145 145 142  K 4.1 4.2 4.9 4.2 2.9* 3.4* 3.3* 4.4  CL 87* 85* 89* 80*  --   --   --  82*  CO2 41* 42* 45* >45*  --   --   --  >45*  GLUCOSE 128* 113* 120* 109*  --   --   --  90  BUN 87* 76* 62* 48*  --   --   --  49*  CREATININE 1.99* 1.65* 1.43* 1.23  --   --   --  1.37*  CALCIUM 8.8* 9.4 9.4 9.5  --   --   --  9.3  MG  --   --   --  2.4  --   --   --   --    Liver Function Tests: Recent Labs  Lab 03/19/22 1310  AST 124*  ALT 141*  ALKPHOS 83  BILITOT 1.1  PROT 7.2  ALBUMIN 3.7   CBG: Recent Labs  Lab 03/24/22 0613 03/24/22 1146 03/24/22 1535 03/24/22 2146 03/25/22 0612  GLUCAP 99 120* 204* 128* 123*    Discharge time spent: greater than 30 minutes.  Signed: [name], MD [facility] 03/25/2022

## 2022-03-25 NOTE — Progress Notes (Signed)
Nurse requested Mobility Specialist to perform oxygen saturation test with pt which includes removing pt from oxygen both at rest and while ambulating.  Below are the results from that testing.     Patient Saturations on Room Air at Rest = spO2 86%  Patient Saturations on 4 Liters of oxygen while Ambulating = sp02 91%  At end of testing pt left in room on 4  Liters of oxygen.  Reported results to nurse.

## 2022-03-25 NOTE — Progress Notes (Signed)
   03/25/22 1000  Mobility  Activity Ambulated with assistance in hallway  Level of Assistance Contact guard assist, steadying assist  Assistive Device Front wheel walker  Distance Ambulated (ft) 280 ft  Activity Response Tolerated well  Mobility Referral Yes  $Mobility charge 1 Mobility   Mobility Specialist Progress Note  Pt EOB and agreeable. Had no c/o pain throughout ambulation. Returned to chair w/ all needs met and call bell in reach.   Lucious Groves Mobility Specialist  Please contact via SecureChat or Rehab office at [phone]

## 2022-03-25 NOTE — Progress Notes (Signed)
Cardiology Progress Note  Patient ID: Brian Parsons MRN: [id number] DOB: [birth year] Date of Encounter: 03/25/2022  Primary Cardiologist: None  Subjective   Chief Complaint: SOB  HPI: Volume status improved.  Heart cath consistent with pulm hypertension.  ROS:  All other ROS reviewed and negative. Pertinent positives noted in the HPI.     Inpatient Medications  Scheduled Meds:  allopurinol  200 mg Oral BID   arformoterol  15 mcg Nebulization BID   aspirin  81 mg Oral Daily   atorvastatin  20 mg Oral Daily   budesonide (PULMICORT) nebulizer solution  0.5 mg Nebulization BID   Chlorhexidine Gluconate Cloth  6 each Topical Daily   docusate sodium  100 mg Oral BID   DULoxetine  30 mg Oral Daily   enoxaparin (LOVENOX) injection  40 mg Subcutaneous Q24H   escitalopram  20 mg Oral Daily   feeding supplement  237 mL Oral BID BM   insulin aspart  0-5 Units Subcutaneous QHS   insulin aspart  0-9 Units Subcutaneous TID WC   linaclotide  145 mcg Oral Daily   metolazone  2.5 mg Oral QODAY   metoprolol succinate  12.5 mg Oral Daily   multivitamin with minerals  1 tablet Oral Daily   oseltamivir  30 mg Oral BID   predniSONE  40 mg Oral Q breakfast   revefenacin  175 mcg Nebulization Daily   sodium chloride flush  3 mL Intravenous Q12H   torsemide  80 mg Oral BID   Continuous Infusions:  sodium chloride Stopped (03/19/22 2025)   sodium chloride     PRN Meds: sodium chloride, acetaminophen, ondansetron (ZOFRAN) IV, phenol, polyethylene glycol, sodium chloride flush   Vital Signs   Vitals:   03/24/22 2302 03/25/22 0320 03/25/22 0737 03/25/22 0828  BP: 128/77 (!) 122/95 (!) 143/120 (!) 109/56  Pulse: 67 64 (!) 206 81  Resp: 15 20 (!) 21   Temp: 98.1 F (36.7 C) 98 F (36.7 C) 98 F (36.7 C)   TempSrc: Oral Oral Oral   SpO2: 91% 93% (!) 78%   Weight:      Height:        Intake/Output Summary (Last 24 hours) at 03/25/2022 0847 Last data filed at 03/25/2022 0837 Gross per 24  hour  Intake 730 ml  Output 2600 ml  Net -1870 ml      03/23/2022    3:23 AM 03/22/2022    4:18 AM 03/21/2022    5:00 AM  Last 3 Weights  Weight (lbs) 192 lb 14.4 oz 194 lb 14.2 oz 212 lb 15.4 oz  Weight (kg) 87.5 kg 88.4 kg 96.6 kg      Telemetry  Overnight telemetry shows sinus rhythm with PACs, findings consistent with wandering atrial pacer, which I personally reviewed.   Physical Exam   Vitals:   03/24/22 2302 03/25/22 0320 03/25/22 0737 03/25/22 0828  BP: 128/77 (!) 122/95 (!) 143/120 (!) 109/56  Pulse: 67 64 (!) 206 81  Resp: 15 20 (!) 21   Temp: 98.1 F (36.7 C) 98 F (36.7 C) 98 F (36.7 C)   TempSrc: Oral Oral Oral   SpO2: 91% 93% (!) 78%   Weight:      Height:        Intake/Output Summary (Last 24 hours) at 03/25/2022 0847 Last data filed at 03/25/2022 0837 Gross per 24 hour  Intake 730 ml  Output 2600 ml  Net -1870 ml       03/23/2022  3:23 AM 03/22/2022    4:18 AM 03/21/2022    5:00 AM  Last 3 Weights  Weight (lbs) 192 lb 14.4 oz 194 lb 14.2 oz 212 lb 15.4 oz  Weight (kg) 87.5 kg 88.4 kg 96.6 kg    Body mass index is 26.16 kg/m.  General: Well nourished, well developed, in no acute distress Head: Atraumatic, normal size  Eyes: PEERLA, EOMI  Neck: Supple, no JVD Endocrine: No thryomegaly Cardiac: Normal S1, S2; irregular rhythm, no murmurs Lungs: Poor airway movement Abd: Soft, nontender, no hepatomegaly  Ext: No edema, pulses 2+ Musculoskeletal: No deformities, BUE and BLE strength normal and equal Skin: Warm and dry, no rashes   Neuro: Alert and oriented to person, place, time, and situation, CNII-XII grossly intact, no focal deficits  Psych: Normal mood and affect   Labs  High Sensitivity Troponin:   Recent Labs  Lab 03/19/22 1310 03/19/22 1531 03/20/22 0556  TROPONINIHS 397* 358* 203*     Cardiac EnzymesNo results for input(s): "TROPONINI" in the last 168 hours. No results for input(s): "TROPIPOC" in the last 168 hours.   Chemistry Recent Labs  Lab 03/19/22 1310 03/20/22 0052 03/23/22 0032 03/24/22 0027 03/24/22 1026 03/24/22 1028 03/24/22 1034 03/25/22 0011  NA 133*   < > 142 140   < > 145 145 142  K 5.1   < > 4.9 4.2   < > 3.4* 3.3* 4.4  CL 77*   < > 89* 80*  --   --   --  82*  CO2 40*   < > 45* >45*  --   --   --  >45*  GLUCOSE 122*   < > 120* 109*  --   --   --  90  BUN 96*   < > 62* 48*  --   --   --  49*  CREATININE 3.81*   < > 1.43* 1.23  --   --   --  1.37*  CALCIUM 8.9   < > 9.4 9.5  --   --   --  9.3  PROT 7.2  --   --   --   --   --   --   --   ALBUMIN 3.7  --   --   --   --   --   --   --   AST 124*  --   --   --   --   --   --   --   ALT 141*  --   --   --   --   --   --   --   ALKPHOS 83  --   --   --   --   --   --   --   BILITOT 1.1  --   --   --   --   --   --   --   GFRNONAA 16*   < > 52* >60  --   --   --  54*  ANIONGAP 16*   < > 8 NOT CALCULATED  --   --   --  NOT CALCULATED   < > = values in this interval not displayed.    Hematology Recent Labs  Lab 03/22/22 0019 03/23/22 0032 03/24/22 0027 03/24/22 1026 03/24/22 1028 03/24/22 1034  WBC 9.2 7.9 9.0  --   --   --   RBC 6.34* 5.88* 6.25*  --   --   --  HGB 18.5* 17.2* 18.6* 16.7 18.0* 18.0*  HCT 59.6* 57.1* 58.6* 49.0 53.0* 53.0*  MCV 94.0 97.1 93.8  --   --   --   MCH 29.2 29.3 29.8  --   --   --   MCHC 31.0 30.1 31.7  --   --   --   RDW 17.4* 16.3* 16.2*  --   --   --   PLT 120* 106* 106*  --   --   --    BNP Recent Labs  Lab 03/19/22 1310  BNP 1,196.0*    DDimer No results for input(s): "DDIMER" in the last 168 hours.   Radiology  CARDIAC CATHETERIZATION  Result Date: 03/24/2022   Mid RCA lesion is 20% stenosed.   Dist RCA lesion is 20% stenosed.   3rd Mrg lesion is 40% stenosed.   Ost 1st Mrg to 1st Mrg lesion is 40% stenosed.   1st Mrg lesion is 40% stenosed.   Mid LAD lesion is 20% stenosed. Findings: Ao = 115/80 (89) LV = 120/19 RA =  8 RV = 45/8 PA = 51/34 (35) PCW = 14 Fick cardiac output/index  = 2.3/2.0 Thermo CO/CI = 4.7/2.2 PVR = 4.9 (Fick) Ao sat = 87% PA sat = 58%, 62% PAPi = 2.1 Assessment: 1. Mild non-obstructive CAD 2. LV function hard to assess due to ectopy, appears about 30% 3. Moderate PAH with moderately reduced CO and PAPI 2.1 suggestive of RV dysfunction Plan/Discussion: Medical therapy. Can sider echo with Definity to better assess LV function Glori Bickers, MD 10:46 AM   Cardiac Studies  LHC/RHC 03/24/2022   Mid RCA lesion is 20% stenosed.   Dist RCA lesion is 20% stenosed.   3rd Mrg lesion is 40% stenosed.   Ost 1st Mrg to 1st Mrg lesion is 40% stenosed.   1st Mrg lesion is 40% stenosed.   Mid LAD lesion is 20% stenosed.   Findings:   Ao = 115/80 (89) LV = 120/19 RA =  8 RV = 45/8 PA = 51/34 (35) PCW = 14 Fick cardiac output/index = 2.3/2.0 Thermo CO/CI = 4.7/2.2 PVR = 4.9 (Fick) Ao sat = 87% PA sat = 58%, 62% PAPi = 2.1   Assessment: 1. Mild non-obstructive CAD 2. LV function hard to assess due to ectopy, appears about 30%  3. Moderate PAH with moderately reduced CO and PAPI 2.1 suggestive of RV dysfunction  TTE 03/20/2022  1. Extremely poor acoustic windows. Definity used. LV function appears  moderate to severely depressed with diffuse hypokinesis. Septal flattening  consistent with RV volume / pressure overload. . There is mild concentric  left ventricular hypertrophy.   2. Right ventricular systolic function is severely reduced. The right  ventricular size is moderately enlarged. Mildly increased right  ventricular wall thickness.   3. Right atrial size was moderately dilated.   4. MV leaflets thickened, with myxomatous appearance . Mild mitral valve  regurgitation.   5. The aortic valve is tricuspid. Aortic valve regurgitation is not  visualized. Aortic valve sclerosis/calcification is present, without any  evidence of aortic stenosis.   6. The inferior vena cava is normal in size with <50% respiratory  variability, suggesting right  atrial pressure of 8 mmHg.   Patient Profile  Brian Parsons is a 70 y.o. male with COPD, HFpEF, hypertension, diabetes who was admitted on 03/19/2022 for acute on chronic COPD exacerbation in setting of influenza.  Cardiology consulted for multifocal atrial tachycardia as well as decompensated right ventricular  failure.  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  Left right heart catheterization yesterday confirms severe RV dysfunction and moderate pulmonary hypertension in the setting of severe lung disease.  Mean PA pressure 35.  Pulmonary capillary wedge pressure is normal at 14. -Will continue with aggressive lung treatment.  He is on home oxygen.  Currently on torsemide 80 mg twice daily.  Also on metolazone 2.5 mg every other day.  Will continue this at discharge. -He follows with his cardiologist at [facility].  He plans to do this at discharge.  # Multifocal atrial tachycardia -No signs of A-fib.  His Coreg was transition to metoprolol succinate 12.5 mg daily.  Okay to continue this at discharge.  Lungs appear to be stable.  # Nonobstructive CAD -Continue aspirin and statin.  # COPD -Per hospital medicine  [place] will sign off.   Medication Recommendations: Medications as above Other recommendations (labs, testing, etc): None Follow up as an outpatient: He will follow with [facility] cardiology.  For questions or updates, please contact [place] Please consult [email or web address] for contact info under        Signed, Lake Bells T. Audie Box, MD, [place]  03/25/2022 8:47 AM

## 2022-03-28 LAB — LIPOPROTEIN A (LPA): Lipoprotein (a): 164.2 nmol/L — ABNORMAL HIGH (ref <75.0)

## 2022-03-31 ENCOUNTER — Inpatient Hospital Stay: Payer: Medicare HMO

## 2022-04-04 ENCOUNTER — Ambulatory Visit (HOSPITAL_COMMUNITY): Admission: RE | Admit: 2022-04-04 | Payer: Medicare HMO | Source: Ambulatory Visit

## 2022-04-21 ENCOUNTER — Inpatient Hospital Stay: Payer: Medicare HMO

## 2022-04-28 ENCOUNTER — Ambulatory Visit: Payer: Medicare HMO | Admitting: Nurse Practitioner

## 2022-04-28 DIAGNOSIS — I10 Essential (primary) hypertension: Secondary | ICD-10-CM

## 2022-04-28 DIAGNOSIS — E559 Vitamin D deficiency, unspecified: Secondary | ICD-10-CM

## 2022-04-28 DIAGNOSIS — E782 Mixed hyperlipidemia: Secondary | ICD-10-CM

## 2022-04-28 DIAGNOSIS — F17200 Nicotine dependence, unspecified, uncomplicated: Secondary | ICD-10-CM

## 2022-04-28 DIAGNOSIS — E1122 Type 2 diabetes mellitus with diabetic chronic kidney disease: Secondary | ICD-10-CM

## 2022-05-05 ENCOUNTER — Inpatient Hospital Stay: Payer: Medicare HMO | Attending: Hematology

## 2022-05-10 ENCOUNTER — Telehealth: Payer: Self-pay | Admitting: *Deleted

## 2022-05-10 DIAGNOSIS — J9611 Chronic respiratory failure with hypoxia: Secondary | ICD-10-CM

## 2022-05-10 DIAGNOSIS — J449 Chronic obstructive pulmonary disease, unspecified: Secondary | ICD-10-CM

## 2022-05-10 NOTE — Telephone Encounter (Signed)
An order for stationary and portable O2 was sent to [facility] for patient on 03/16/22. Community message sent to [facility] and will update encounter once I have an update/ message back.

## 2022-05-10 NOTE — Telephone Encounter (Signed)
Liz Malady; Fritzi Mandes D, CMA; Stenson, [place]; [place], Leory Plowman; Valier, Hiltonia Yes, We will need a new POC EVAL order with the verbiage below please.   " If new O2 start,, Please evaluate and titrate for best fit POC or portable O2 system. RT to evaluate and titrate patient for POC or Homefill with OCD maintain sats >/=90%. if patient qualifies, dispense POC or homefill with OCD 1-5 pulse dose."  Thanks!         New order placed.  Called and notified patient of order being placed.  Nothing further needed at this time.

## 2022-05-16 ENCOUNTER — Telehealth: Payer: Self-pay | Admitting: *Deleted

## 2022-05-16 DIAGNOSIS — J449 Chronic obstructive pulmonary disease, unspecified: Secondary | ICD-10-CM

## 2022-05-16 NOTE — Telephone Encounter (Signed)
Community message sent to [facility] for an update. In referral note, order was received.   Will update encounter once a message is sent back from [facility] staff.

## 2022-05-16 NOTE — Telephone Encounter (Signed)
Vickii Penna, [place]; Bland Span; Eulas Post, Jasmine Follow up on this. I just messaged the scheduler to see what the hold up was.

## 2022-05-17 MED ORDER — ALBUTEROL SULFATE HFA 108 (90 BASE) MCG/ACT IN AERS: 2.0000 | INHALATION_SPRAY | Freq: Four times a day (QID) | RESPIRATORY_TRACT | 11 refills | Status: DC | PRN

## 2022-05-17 NOTE — Telephone Encounter (Signed)
From: Minus Liberty Sent: 05/17/2022   1:02 PM EST To: Minus Liberty; Sharlot Gowda, CMA Subject: RE: O2                                         Of course! I reached out to our schedulers and they were clarifying some things on his order. He should have received a call a few minutes ago.

## 2022-05-17 NOTE — Telephone Encounter (Signed)
Spoke with patients wife and she voiced understanding of recs per MW.   Scheduled an appt for him.  Nothing further needed

## 2022-05-17 NOTE — Telephone Encounter (Signed)
Ok for refills and nebulier but needs ov w/in a month with all meds to regroup

## 2022-05-17 NOTE — Telephone Encounter (Signed)
Patient called office this morning asking for an update on O2. Read below message to him and he asked that we reach out to [facility] again for another update and asked that we have them call him as well. Community message sent to [facility] again.  Patient is also asking for an order for a nebulizer and a refill on rescue inhaler.   Dr. Melvyn Novas are you okay with [place] placing an order patient to get a nebulizer machine?

## 2022-05-19 ENCOUNTER — Inpatient Hospital Stay: Payer: Medicare HMO

## 2022-05-25 ENCOUNTER — Ambulatory Visit: Payer: Medicare HMO | Admitting: Nurse Practitioner

## 2022-05-25 DIAGNOSIS — E782 Mixed hyperlipidemia: Secondary | ICD-10-CM

## 2022-05-25 DIAGNOSIS — F17200 Nicotine dependence, unspecified, uncomplicated: Secondary | ICD-10-CM

## 2022-05-25 DIAGNOSIS — E559 Vitamin D deficiency, unspecified: Secondary | ICD-10-CM

## 2022-05-25 DIAGNOSIS — I10 Essential (primary) hypertension: Secondary | ICD-10-CM

## 2022-05-25 DIAGNOSIS — E1122 Type 2 diabetes mellitus with diabetic chronic kidney disease: Secondary | ICD-10-CM

## 2022-05-26 ENCOUNTER — Inpatient Hospital Stay: Payer: Medicare HMO

## 2022-05-26 ENCOUNTER — Inpatient Hospital Stay: Payer: Medicare HMO | Admitting: Physician Assistant

## 2022-05-29 ENCOUNTER — Telehealth: Payer: Self-pay | Admitting: *Deleted

## 2022-05-29 DIAGNOSIS — J9611 Chronic respiratory failure with hypoxia: Secondary | ICD-10-CM

## 2022-05-29 NOTE — Telephone Encounter (Signed)
Called and spoke to Brian Parsons (patients girlfriend).  She states that patient spoke with someone in [place] with [facility]. They told him he needs to bring all of his tanks to the office and do an evaluation for a best fit for portable O2. Brian Parsons states the person they talked to with [facility] wanted the patient to call our office and have the order changed for an [employer].   Community message sent to [facility].

## 2022-05-29 NOTE — Telephone Encounter (Signed)
Brian Parsons, Brian Parsons, Brian Parsons; Minus Liberty [facility],  This is the email received from the branch that is handling his order. "I just spoke with pt andhe states he can't come in for poc eval.  It is too far for him  to drive and he can't handle the tanks. He also stated he had poc with previous provider and doesn't understand why he has to come here. He states he isn't going to jeopardize his life to come in for [place] when he had poc with his other provider. He asked that we speak to [facility] to get updated rx for poc with liter flow for pulse dose. I advised pt that once we have updated rx, we will deliver to his home and p/u hf compressor and tanks. Please advise asap as pt has appts this week." If they receive POC rx with the liter flow and pulse dose they can dispense the equipment to him since he is unable to do walk test in office.

## 2022-05-29 NOTE — Telephone Encounter (Signed)
Penne Lash, Nery Frappier D, CMA; Minus Liberty Correct, with this verbiage included as well.   "If new O2 start,, Please evaluate and titrate for best fit POC or portable O2 system. RT to evaluate and titrate patient for POC or Homefill with OCD maintain sats >/=90%. if patient qualifies, dispense POC or homefill with OCD 1-5 pulse dose."       Previous Messages    ----- Message ----- From: Sharlot Gowda, CMA Sent: 05/29/2022  12:00 PM EST To: Minus Liberty Subject: RE: Portable O2 order issues/ patient upset *  Thanks Jasmine! So just to clarify we can order with this template for him to get an inogen?:  Route (nasal cannula OR mask): [place] Liter Flow: 3 Frequency (continuous with stationary and portable oxygen unit needed OR only at night): POC- Inogen machine Length of Need: Lifetime Was this based off an ONO?: no DME: [facility]    Order placed as instructed. Called patients girlfriend Sharyn Lull. No answer. LVM letting her know Order was corrected and would be sent to [facility]. Advised on vm to call back for anything further.Marland Kitchen

## 2022-06-05 ENCOUNTER — Telehealth: Payer: Self-pay | Admitting: *Deleted

## 2022-06-05 NOTE — Telephone Encounter (Signed)
Called and spoke to [place].   She states [facility] reached out to patient and they are bringing O2 today.

## 2022-06-05 NOTE — Telephone Encounter (Signed)
Reinaldo Raddle D, CMA; Darlina Guys; [place], Danton Sewer; Eulas Post, [place] branch tried reaching out and even went out to the home and their was no answer 06/02/2022 4:34PM. I have emailed that branch and included Melissa on that as well. Will let you know what they say.  thanks

## 2022-06-09 ENCOUNTER — Inpatient Hospital Stay: Payer: Medicare HMO | Attending: Hematology

## 2022-06-09 ENCOUNTER — Inpatient Hospital Stay: Payer: Medicare HMO

## 2022-06-16 ENCOUNTER — Inpatient Hospital Stay: Payer: Medicare HMO | Admitting: Physician Assistant

## 2022-06-23 ENCOUNTER — Ambulatory Visit: Payer: Medicare HMO | Admitting: Nurse Practitioner

## 2022-06-23 DIAGNOSIS — I10 Essential (primary) hypertension: Secondary | ICD-10-CM

## 2022-06-23 DIAGNOSIS — E782 Mixed hyperlipidemia: Secondary | ICD-10-CM

## 2022-06-23 DIAGNOSIS — E559 Vitamin D deficiency, unspecified: Secondary | ICD-10-CM

## 2022-06-23 DIAGNOSIS — F17200 Nicotine dependence, unspecified, uncomplicated: Secondary | ICD-10-CM

## 2022-06-23 DIAGNOSIS — E1122 Type 2 diabetes mellitus with diabetic chronic kidney disease: Secondary | ICD-10-CM

## 2022-06-29 NOTE — Progress Notes (Deleted)
Hanan Vado, adult    DOB: [birth year]      MRN: [id number]   Brief patient profile:  67 yowm active smoker followed previously by Dr Luan Pulling with copd c/b chronic hypoxemia / polycythemia and referred to pulmonary clinic 10/14/2019 by Dr   Bartolo Darter in [place]     History of Present Illness  10/14/2019  Pulmonary/ 1st office eval/Shakai Dolley  - not vaccinated  Chief Complaint  Patient presents with   Pulmonary Consult    Referred by Dr [name]. Former pt of Dr Luan Pulling. He uses o2 with sleep and as needed during the day. He states has good and bad days with his breathing. Smokes 1/2 ppd. He is using albuterol inhaler 2 x per wk and neb about 2 x per day.   Dyspnea:  No trouble up and down aisles at [employer] [facility] [employer] parking  s 02 but never checks it Cough: minimal am cough x 30 min >  1-2 tsp slt green better p abx  Sleep: 30 degrees with flat bed / pillows  SABA use: too much neb rx  02  2.5 hs  Rec Plan A = Automatic = Always=     trelegy one click each  Plan B = Backup (to supplement plan A, not to replace it) Only use your albuterol inhaler as a rescue medication  Plan C = Crisis (instead of Plan B but only if Plan B stops working) - only use your albuterol nebulizer if you first try Plan B and it fails to help  Try albuterol 15 min before an activity that you know would make you short of breath and see if it makes any difference and if makes none then don't take it after activity unless you can't catch your breath. The key is to stop smoking completely before smoking completely stops you! I strongly urge you to get the moderna or pfizer vaccine asap based on proven safety and effectiveness  We will set you up for an overnight 02 level on your oxygen  Please schedule a follow up visit in 6 months but call sooner if needed     03/11/2021  f/u ov/[place] office/Charmian Forbis re: COPD ? Gold  maint on trelegy and 02 most of the time @ 2.5 lpm but req phlebotomy  with hct 61 on 02/28/21   Chief Complaint  Patient presents with   Follow-up    Feels symptoms have improved since last ov. SOB has improved. Trelegy has helped symptoms.   Uses oxygen at home 2.5L O2 cont.     Dyspnea:  improved since last ov/ trying to lose wt  Cough: smoker's rattle clear / first hour  Sleeping: flat bed but 30 degrees with pillows  SABA use: neb once a day  02: 2.5 25/7  Covid status: never vax/ never infected  Rec Plan A = Automatic = Always=    Trelegy [id number] one click each am  Plan B = Backup (to supplement plan A, not to replace it) Only use your albuterol inhaler as a rescue medication  Plan C = Crisis (instead of Plan B but only if Plan B stops working) - only use your albuterol nebulizer (8 x stronger) if you first try Plan B   The key is to stop smoking completely before smoking completely stops you! Goal with goal with 02 is keep it above 90% saturation at all times on your meter.     08/29/2021  f/u ov/[place] office/Trisha Morandi re: GOLD ? Copd  maint on trelegy and requiring phlebotomy for secondary polycythemia  Chief Complaint  Patient presents with   Follow-up    Sob is a little better since last OV.  Still using 2.5 LO2 cont.  At home prn    Dyspnea:  MMRC2 = can't walk a nl pace on a flat grade s sob but does fine slow and flat / has trouble with lifting  Cough: some am congestion/rattling green mucus x sev easy  Sleeping: flat bed/ 3-4 pillows  SABA use: not much hfa/ seldom hs 02: 2lpm hs and prn  Covid status: never vax / infected  Lung cancer screening: referred back    06/30/2022  f/u ov/[place] office/Toua Stites re: *** maint on ***  No chief complaint on file.   Dyspnea:  *** Cough: *** Sleeping: *** SABA use: *** 02: *** Covid status: *** Lung cancer screening: ***   No obvious day to day or daytime variability or assoc excess/ purulent sputum or mucus plugs or hemoptysis or cp or chest tightness, subjective wheeze or overt sinus or hb symptoms.   ***  without nocturnal  or early am exacerbation  of respiratory  c/o's or need for noct saba. Also denies any obvious fluctuation of symptoms with weather or environmental changes or other aggravating or alleviating factors except as outlined above   No unusual exposure hx or h/o childhood pna/ asthma or knowledge of premature birth.  Current Allergies, Complete Past Medical History, Past Surgical History, Family History, and Social History were reviewed in [employer] record.  ROS  The following are not active complaints unless bolded Hoarseness, sore throat, dysphagia, dental problems, itching, sneezing,  nasal congestion or discharge of excess mucus or purulent secretions, ear ache,   fever, chills, sweats, unintended wt loss or wt gain, classically pleuritic or exertional cp,  orthopnea pnd or arm/hand swelling  or leg swelling, presyncope, palpitations, abdominal pain, anorexia, nausea, vomiting, diarrhea  or change in bowel habits or change in bladder habits, change in stools or change in urine, dysuria, hematuria,  rash, arthralgias, visual complaints, headache, numbness, weakness or ataxia or problems with walking or coordination,  change in mood or  memory.        No outpatient medications have been marked as taking for the 06/30/22 encounter (Appointment) with Tanda Rockers, MD.          Past Medical History:  Diagnosis Date   Bilateral leg edema    CHF (congestive heart failure) ([place])    COPD (chronic obstructive pulmonary disease) ([place])    Coronary atherosclerosis    Nonobstructive at cardiac catheterization April 2017   Diabetes mellitus without complication ([place])    Essential hypertension    On home O2    2L N/C prn   Polycythemia         Objective:    Wts  06/30/2022        ***  08/29/2021       199  03/11/21 215 lb 0.6 oz (97.5 kg)  11/15/20 217 lb 3.2 oz (98.5 kg)  05/17/20 224 lb 12.8 oz (102 kg)     Vital signs reviewed  06/30/2022  - Note  at rest 02 sats  ***% on ***   General appearance:    ***     Mod barr***              Assessment

## 2022-06-30 ENCOUNTER — Ambulatory Visit: Payer: Medicare HMO | Admitting: Internal Medicine

## 2022-08-01 ENCOUNTER — Ambulatory Visit: Payer: Medicare HMO | Admitting: Internal Medicine

## 2022-08-01 NOTE — Progress Notes (Deleted)
Brian Parsons, adult    DOB: 08-Dec-1950      MRN: [id number]   Brief patient profile:  59 yowm active smoker followed previously by Dr Luan Pulling with copd c/b chronic hypoxemia / polycythemia and referred to pulmonary clinic 10/14/2019 by Dr   Bartolo Darter in [place]      History of Present Illness  10/14/2019  Pulmonary/ 1st office eval/Brian Parsons  - not vaccinated  Chief Complaint  Patient presents with   Pulmonary Consult    Referred by Dr Brian Parsons. Former pt of Dr Luan Pulling. He uses o2 with sleep and as needed during the day. He states has good and bad days with his breathing. Smokes 1/2 ppd. He is using albuterol inhaler 2 x per wk and neb about 2 x per day.   Dyspnea:  No trouble up and down aisles at [employer] [facility] [employer] parking  s 02 but never checks it Cough: minimal am cough x 30 min >  1-2 tsp slt green better p abx  Sleep: 30 degrees with flat bed / pillows  SABA use: too much neb rx  02  2.5 hs  Rec Plan A = Automatic = Always=     trelegy one click each  Plan B = Backup (to supplement plan A, not to replace it) Only use your albuterol inhaler as a rescue medication  Plan C = Crisis (instead of Plan B but only if Plan B stops working) - only use your albuterol nebulizer if you first try Plan B and it fails to help  Try albuterol 15 min before an activity that you know would make you short of breath and see if it makes any difference and if makes none then don't take it after activity unless you can't catch your breath. The key is to stop smoking completely before smoking completely stops you! I strongly urge you to get the moderna or pfizer vaccine asap based on proven safety and effectiveness  We will set you up for an overnight 02 level on your oxygen  Please schedule a follow up visit in 6 months but call sooner if needed     03/11/2021  f/u ov/[place] office/Aragon Scarantino re: COPD ? Gold  maint on trelegy and 02 most of the time @ 2.5 lpm but req phlebotomy  with hct 61 on 02/28/21   Chief Complaint  Patient presents with   Follow-up    Feels symptoms have improved since last ov. SOB has improved. Trelegy has helped symptoms.   Uses oxygen at home 2.5L O2 cont.     Dyspnea:  improved since last ov/ trying to lose wt  Cough: smoker's rattle clear / first hour  Sleeping: flat bed but 30 degrees with pillows  SABA use: neb once a day  02: 2.5 25/7  Covid status: never vax/ never infected  Rec Plan A = Automatic = Always=    Trelegy 073 one click each am  Plan B = Backup (to supplement plan A, not to replace it) Only use your albuterol inhaler as a rescue medication  Plan C = Crisis (instead of Plan B but only if Plan B stops working) - only use your albuterol nebulizer (8 x stronger) if you first try Plan B   The key is to stop smoking completely before smoking completely stops you! Goal with goal with 02 is keep it above 90% saturation at all times on your meter.     08/29/2021  f/u ov/[place] office/Brian Parsons re: GOLD ? Copd  maint on trelegy and requiring phlebotomy for secondary polycythemia  Chief Complaint  Patient presents with   Follow-up    Sob is a little better since last OV.  Still using 2.5 LO2 cont.  At home prn    Dyspnea:  MMRC2 = can't walk a nl pace on a flat grade s sob but does fine slow and flat / has trouble with lifting  Cough: some am congestion/rattling green mucus x sev easy  Sleeping: flat bed/ 3-4 pillows  SABA use: not much hfa/ seldom hs 02: 2lpm hs and prn  Covid status: never vax / infected  Lung cancer screening: referred back today  Rec My office will be contacting you by phone for referral to consider starting back on lung cancer screening  For "allergies"  = zyrtec 10 mg in pm as needed  Zpak  Make sure you check your oxygen saturation  AT  your highest level of activity (not after you stop)   to be sure it stays over 90%   08/01/2022  f/u ov/[place] office/[name] re: *** maint on ***  No chief complaint on file.    Dyspnea:  *** Cough: *** Sleeping: *** SABA use: *** 02: *** Covid status: *** Lung cancer screening: ***   No obvious day to day or daytime variability or assoc excess/ purulent sputum or mucus plugs or hemoptysis or cp or chest tightness, subjective wheeze or overt sinus or hb symptoms.   *** without nocturnal  or early am exacerbation  of respiratory  c/o's or need for noct saba. Also denies any obvious fluctuation of symptoms with weather or environmental changes or other aggravating or alleviating factors except as outlined above   No unusual exposure hx or h/o childhood pna/ asthma or knowledge of premature birth.  Current Allergies, Complete Past Medical History, Past Surgical History, Family History, and Social History were reviewed in [employer] record.  ROS  The following are not active complaints unless bolded Hoarseness, sore throat, dysphagia, dental problems, itching, sneezing,  nasal congestion or discharge of excess mucus or purulent secretions, ear ache,   fever, chills, sweats, unintended wt loss or wt gain, classically pleuritic or exertional cp,  orthopnea pnd or arm/hand swelling  or leg swelling, presyncope, palpitations, abdominal pain, anorexia, nausea, vomiting, diarrhea  or change in bowel habits or change in bladder habits, change in stools or change in urine, dysuria, hematuria,  rash, arthralgias, visual complaints, headache, numbness, weakness or ataxia or problems with walking or coordination,  change in mood or  memory.        No outpatient medications have been marked as taking for the 08/01/22 encounter (Appointment) with [name], MD.          Past Medical History:  Diagnosis Date   Bilateral leg edema    CHF (congestive heart failure) (HCC)    COPD (chronic obstructive pulmonary disease) (HCC)    Coronary atherosclerosis    Nonobstructive at cardiac catheterization April 2017   Diabetes mellitus without complication  (HCC)    Essential hypertension    On home O2    2L N/C prn   Polycythemia         Objective:    Wts  08/01/2022        ***  08/29/2021       199  03/11/21 215 lb 0.6 oz (97.5 kg)  11/15/20 217 lb 3.2 oz (98.5 kg)  05/17/20 224 lb 12.8 oz (102 kg)  Vital signs reviewed  08/01/2022  - Note at rest 02 sats  ***% on ***   General appearance:    ***    Mod bar ***     Assessment

## 2022-10-02 ENCOUNTER — Inpatient Hospital Stay: Payer: Medicare HMO | Attending: Physician Assistant

## 2022-10-02 DIAGNOSIS — D751 Secondary polycythemia: Secondary | ICD-10-CM | POA: Diagnosis present

## 2022-10-02 DIAGNOSIS — E119 Type 2 diabetes mellitus without complications: Secondary | ICD-10-CM | POA: Diagnosis not present

## 2022-10-02 DIAGNOSIS — J9611 Chronic respiratory failure with hypoxia: Secondary | ICD-10-CM | POA: Insufficient documentation

## 2022-10-02 DIAGNOSIS — H538 Other visual disturbances: Secondary | ICD-10-CM | POA: Insufficient documentation

## 2022-10-02 DIAGNOSIS — I509 Heart failure, unspecified: Secondary | ICD-10-CM | POA: Diagnosis not present

## 2022-10-02 DIAGNOSIS — R42 Dizziness and giddiness: Secondary | ICD-10-CM | POA: Diagnosis not present

## 2022-10-02 DIAGNOSIS — I2510 Atherosclerotic heart disease of native coronary artery without angina pectoris: Secondary | ICD-10-CM | POA: Insufficient documentation

## 2022-10-02 DIAGNOSIS — D696 Thrombocytopenia, unspecified: Secondary | ICD-10-CM | POA: Diagnosis not present

## 2022-10-02 DIAGNOSIS — Z9981 Dependence on supplemental oxygen: Secondary | ICD-10-CM | POA: Diagnosis not present

## 2022-10-02 DIAGNOSIS — F17210 Nicotine dependence, cigarettes, uncomplicated: Secondary | ICD-10-CM | POA: Insufficient documentation

## 2022-10-02 DIAGNOSIS — J449 Chronic obstructive pulmonary disease, unspecified: Secondary | ICD-10-CM | POA: Diagnosis not present

## 2022-10-02 LAB — CBC WITH DIFFERENTIAL/PLATELET
Abs Immature Granulocytes: 0.13 10*3/uL — ABNORMAL HIGH (ref 0.00–0.07)
Basophils Absolute: 0.1 10*3/uL (ref 0.0–0.1)
Basophils Relative: 0 %
Eosinophils Absolute: 0.2 10*3/uL (ref 0.0–0.5)
Eosinophils Relative: 2 %
HCT: 58.6 % — ABNORMAL HIGH (ref 39.0–52.0)
Hemoglobin: 19.7 g/dL — ABNORMAL HIGH (ref 13.0–17.0)
Immature Granulocytes: 1 %
Lymphocytes Relative: 22 %
Lymphs Abs: 3.3 10*3/uL (ref 0.7–4.0)
MCH: 30.7 pg (ref 26.0–34.0)
MCHC: 33.6 g/dL (ref 30.0–36.0)
MCV: 91.3 fL (ref 80.0–100.0)
Monocytes Absolute: 1.2 10*3/uL — ABNORMAL HIGH (ref 0.1–1.0)
Monocytes Relative: 8 %
Neutro Abs: 9.9 10*3/uL — ABNORMAL HIGH (ref 1.7–7.7)
Neutrophils Relative %: 67 %
Platelets: 159 10*3/uL (ref 150–400)
RBC: 6.42 MIL/uL — ABNORMAL HIGH (ref 4.22–5.81)
RDW: 15.5 % (ref 11.5–15.5)
WBC: 14.8 10*3/uL — ABNORMAL HIGH (ref 4.0–10.5)
nRBC: 0 % (ref 0.0–0.2)

## 2022-10-02 LAB — IRON AND TIBC
Iron: 155 ug/dL (ref 45–182)
Saturation Ratios: 40 % — ABNORMAL HIGH (ref 17.9–39.5)
TIBC: 384 ug/dL (ref 250–450)
UIBC: 229 ug/dL

## 2022-10-02 LAB — VITAMIN B12: Vitamin B-12: 291 pg/mL (ref 180–914)

## 2022-10-02 LAB — FERRITIN: Ferritin: 271 ng/mL (ref 24–336)

## 2022-10-02 LAB — FOLATE: Folate: 12.2 ng/mL (ref >5.9)

## 2022-10-02 LAB — IMMATURE PLATELET FRACTION: Immature Platelet Fraction: 9.3 % — ABNORMAL HIGH (ref 1.2–8.6)

## 2022-10-03 LAB — KAPPA/LAMBDA LIGHT CHAINS
Kappa free light chain: 63.1 mg/L — ABNORMAL HIGH (ref 3.3–19.4)
Kappa, lambda light chain ratio: 1.51 (ref 0.26–1.65)
Lambda free light chains: 41.8 mg/L — ABNORMAL HIGH (ref 5.7–26.3)

## 2022-10-03 LAB — HOMOCYSTEINE: Homocysteine: 30.7 umol/L — ABNORMAL HIGH (ref 0.0–19.2)

## 2022-10-03 LAB — COPPER, SERUM: Copper: 109 ug/dL (ref 69–132)

## 2022-10-05 LAB — PROTEIN ELECTROPHORESIS, SERUM
A/G Ratio: 1 (ref 0.7–1.7)
Albumin ELP: 3.7 g/dL (ref 2.9–4.4)
Alpha-1-Globulin: 0.3 g/dL (ref 0.0–0.4)
Alpha-2-Globulin: 0.9 g/dL (ref 0.4–1.0)
Beta Globulin: 1.2 g/dL (ref 0.7–1.3)
Gamma Globulin: 1.2 g/dL (ref 0.4–1.8)
Globulin, Total: 3.6 g/dL (ref 2.2–3.9)
Total Protein ELP: 7.3 g/dL (ref 6.0–8.5)

## 2022-10-05 LAB — METHYLMALONIC ACID, SERUM: Methylmalonic Acid, Quantitative: 3664 nmol/L — ABNORMAL HIGH (ref 0–378)

## 2022-10-06 NOTE — Progress Notes (Deleted)
CANCEL PER PATIENT

## 2022-10-09 ENCOUNTER — Inpatient Hospital Stay: Payer: Medicare HMO | Admitting: Physician Assistant

## 2022-10-09 ENCOUNTER — Inpatient Hospital Stay: Payer: Medicare HMO

## 2022-10-09 LAB — IMMUNOFIXATION ELECTROPHORESIS
IgA: 384 mg/dL (ref 61–437)
IgG (Immunoglobin G), Serum: 1250 mg/dL (ref 603–1613)
IgM (Immunoglobulin M), Srm: 53 mg/dL (ref 15–143)
Total Protein ELP: 7.3 g/dL (ref 6.0–8.5)

## 2023-03-20 ENCOUNTER — Encounter (HOSPITAL_COMMUNITY): Payer: Self-pay | Admitting: Hematology

## 2023-03-21 NOTE — Progress Notes (Unsigned)
Brian Parsons, adult    DOB: 05/31/1950      MRN: [id number]   Brief patient profile:  30 yowm active smoker followed previously by Dr Juanetta Gosling with copd c/b chronic hypoxemia / polycythemia and referred to pulmonary clinic 10/14/2019 by Dr   Bryna Colander in [place]      History of Present Illness  10/14/2019  Pulmonary/ 1st office eval/[name]  - not vaccinated  Chief Complaint  Patient presents with   Pulmonary Consult    Referred by Dr Smith Robert. Former pt of Dr Juanetta Gosling. He uses o2 with sleep and as needed during the day. He states has good and bad days with his breathing. Smokes 1/2 ppd. He is using albuterol inhaler 2 x per wk and neb about 2 x per day.   Dyspnea:  No trouble up and down aisles at [employer] [facility] [employer] parking  s 02 but never checks it Cough: minimal am cough x 30 min >  1-2 tsp slt green better p abx  Sleep: 30 degrees with flat bed / pillows  SABA use: too much neb rx  02  2.5 hs  Rec Plan A = Automatic = Always=     trelegy one click each  Plan B = Backup (to supplement plan A, not to replace it) Only use your albuterol inhaler as a rescue medication  Plan C = Crisis (instead of Plan B but only if Plan B stops working) - only use your albuterol nebulizer if you first try Plan B and it fails to help  Try albuterol 15 min before an activity that you know would make you short of breath and see if it makes any difference and if makes none then don't take it after activity unless you can't catch your breath. The key is to stop smoking completely before smoking completely stops you! I strongly urge you to get the moderna or pfizer vaccine asap based on proven safety and effectiveness  We will set you up for an overnight 02 level on your oxygen  Please schedule a follow up visit in 6 months but call sooner if needed     03/11/2021  f/u ov/[place] office/Brian Parsons re: COPD ? Gold  maint on trelegy and 02 most of the time @ 2.5 lpm but req phlebotomy  with hct 61 on 02/28/21   Chief Complaint  Patient presents with   Follow-up    Feels symptoms have improved since last ov. SOB has improved. Trelegy has helped symptoms.   Uses oxygen at home 2.5L O2 cont.     Dyspnea:  improved since last ov/ trying to lose wt  Cough: smoker's rattle clear / first hour  Sleeping: flat bed but 30 degrees with pillows  SABA use: neb once a day  02: 2.5 25/7  Covid status: never vax/ never infected  Rec Plan A = Automatic = Always=    Trelegy 100 one click each am  Plan B = Backup (to supplement plan A, not to replace it) Only use your albuterol inhaler as a rescue medication  Plan C = Crisis (instead of Plan B but only if Plan B stops working) - only use your albuterol nebulizer (8 x stronger) if you first try Plan B   The key is to stop smoking completely before smoking completely stops you! Goal with goal with 02 is keep it above 90% saturation at all times on your meter.     08/29/2021  f/u ov/[place] office/Brian Parsons re: GOLD ? Copd  maint on trelegy and requiring phlebotomy for secondary polycythemia  Chief Complaint  Patient presents with   Follow-up    Sob is a little better since last OV.  Still using 2.5 LO2 cont.  At home prn    Dyspnea:  MMRC2 = can't walk a nl pace on a flat grade s sob but does fine slow and flat / has trouble with lifting  Cough: some am congestion/rattling green mucus x sev easy  Sleeping: flat bed/ 3-4 pillows  SABA use: not much hfa/ seldom hs 02: 2lpm hs and prn  Covid status: never vax / infected  Lung cancer screening: referred back today  Rec My office will be contacting you by phone for referral to consider starting back on lung cancer screening  For "allergies"  = zyrtec 10 mg in pm as needed  Zpak  Make sure you check your oxygen saturation  AT  your highest level of activity (not after you stop)   to be sure it stays over 90%  Please schedule a follow up visit in 6 months but call sooner if needed       03/22/2023  f/u  ov/[place] office/Brian Parsons re: GOLD ? Copd/ did not do PFTs as rec  maint on ***  No chief complaint on file.   Dyspnea:  *** Cough: *** Sleeping: ***   resp cc  SABA use: *** 02: ***  Lung cancer screening: ***   No obvious day to day or daytime variability or assoc excess/ purulent sputum or mucus plugs or hemoptysis or cp or chest tightness, subjective wheeze or overt sinus or hb symptoms.    Also denies any obvious fluctuation of symptoms with weather or environmental changes or other aggravating or alleviating factors except as outlined above   No unusual exposure hx or h/o childhood pna/ asthma or knowledge of premature birth.  Current Allergies, Complete Past Medical History, Past Surgical History, Family History, and Social History were reviewed in [employer] record.  ROS  The following are not active complaints unless bolded Hoarseness, sore throat, dysphagia, dental problems, itching, sneezing,  nasal congestion or discharge of excess mucus or purulent secretions, ear ache,   fever, chills, sweats, unintended wt loss or wt gain, classically pleuritic or exertional cp,  orthopnea pnd or arm/hand swelling  or leg swelling, presyncope, palpitations, abdominal pain, anorexia, nausea, vomiting, diarrhea  or change in bowel habits or change in bladder habits, change in stools or change in urine, dysuria, hematuria,  rash, arthralgias, visual complaints, headache, numbness, weakness or ataxia or problems with walking or coordination,  change in mood or  memory.        No outpatient medications have been marked as taking for the 03/22/23 encounter (Appointment) with Brian Cowden, MD.           Past Medical History:  Diagnosis Date   Bilateral leg edema    CHF (congestive heart failure) (HCC)    COPD (chronic obstructive pulmonary disease) (HCC)    Coronary atherosclerosis    Nonobstructive at cardiac catheterization April 2017   Diabetes mellitus without  complication (HCC)    Essential hypertension    On home O2    2L N/C prn   Polycythemia         Objective:    Wts  03/22/2023       ***  08/29/2021       199  03/11/21 215 lb 0.6 oz (97.5 kg)  11/15/20 217 lb  3.2 oz (98.5 kg)  05/17/20 224 lb 12.8 oz (102 kg)    Vital signs reviewed  08/29/2021  - Note at rest 02 sats  90% on RA     Mod bar***     Assessment

## 2023-03-22 ENCOUNTER — Ambulatory Visit: Payer: Medicare HMO | Admitting: Internal Medicine

## 2023-03-22 ENCOUNTER — Encounter: Payer: Self-pay | Admitting: Internal Medicine

## 2023-03-22 DIAGNOSIS — F410 Panic disorder [episodic paroxysmal anxiety] without agoraphobia: Secondary | ICD-10-CM | POA: Insufficient documentation

## 2023-04-19 ENCOUNTER — Ambulatory Visit: Payer: Medicare HMO | Admitting: Internal Medicine

## 2023-06-01 ENCOUNTER — Encounter (HOSPITAL_COMMUNITY): Payer: Self-pay | Admitting: Hematology

## 2023-06-18 ENCOUNTER — Encounter (HOSPITAL_COMMUNITY): Payer: Self-pay | Admitting: Hematology

## 2023-06-24 NOTE — Progress Notes (Deleted)
 Brian Parsons, adult    DOB: 12-31-1950      MRN: [id number]   Brief patient profile:  29 yowm active smoker followed previously by Dr Juanetta Gosling with copd c/b chronic hypoxemia / polycythemia and referred to pulmonary clinic 10/14/2019 by Dr   Bryna Colander in [place]      History of Present Illness  10/14/2019  Pulmonary/ 1st office eval/Brian Parsons  - not vaccinated  Chief Complaint  Patient presents with   Pulmonary Consult    Referred by Dr Smith Robert. Former pt of Dr Juanetta Gosling. He uses o2 with sleep and as needed during the day. He states has good and bad days with his breathing. Smokes 1/2 ppd. He is using albuterol inhaler 2 x per wk and neb about 2 x per day.   Dyspnea:  No trouble up and down aisles at [employer] [facility] parking  s 02 but never checks it Cough: minimal am cough x 30 min >  1-2 tsp slt green better p abx  Sleep: 30 degrees with flat bed / pillows  SABA use: too much neb rx  02  2.5 hs  Rec Plan A = Automatic = Always=     trelegy one click each  Plan B = Backup (to supplement plan A, not to replace it) Only use your albuterol inhaler as a rescue medication  Plan C = Crisis (instead of Plan B but only if Plan B stops working) - only use your albuterol nebulizer if you first try Plan B and it fails to help  Try albuterol 15 min before an activity that you know would make you short of breath and see if it makes any difference and if makes none then don't take it after activity unless you can't catch your breath. The key is to stop smoking completely before smoking completely stops you! I strongly urge you to get the moderna or pfizer vaccine asap based on proven safety and effectiveness  We will set you up for an overnight 02 level on your oxygen  Please schedule a follow up visit in 6 months but call sooner if needed     03/11/2021  f/u ov/[place] office/Brian Parsons re: COPD ? Gold  maint on trelegy and 02 most of the time @ 2.5 lpm but req phlebotomy  with hct 61 on 02/28/21   Chief Complaint  Patient presents with   Follow-up    Feels symptoms have improved since last ov. SOB has improved. Trelegy has helped symptoms.   Uses oxygen at home 2.5L O2 cont.     Dyspnea:  improved since last ov/ trying to lose wt  Cough: smoker's rattle clear / first hour  Sleeping: flat bed but 30 degrees with pillows  SABA use: neb once a day  02: 2.5 25/7  Covid status: never vax/ never infected  Rec Plan A = Automatic = Always=    Trelegy 100 one click each am  Plan B = Backup (to supplement plan A, not to replace it) Only use your albuterol inhaler as a rescue medication  Plan C = Crisis (instead of Plan B but only if Plan B stops working) - only use your albuterol nebulizer (8 x stronger) if you first try Plan B   The key is to stop smoking completely before smoking completely stops you! Goal with goal with 02 is keep it above 90% saturation at all times on your meter.     08/29/2021  f/u ov/[place] office/Brian Parsons re: GOLD ? Copd  maint on trelegy and requiring phlebotomy for secondary polycythemia  Chief Complaint  Patient presents with   Follow-up    Sob is a little better since last OV.  Still using 2.5 LO2 cont.  At home prn    Dyspnea:  MMRC2 = can't walk a nl pace on a flat grade s sob but does fine slow and flat / has trouble with lifting  Cough: some am congestion/rattling green mucus x sev easy  Sleeping: flat bed/ 3-4 pillows  SABA use: not much hfa/ seldom hs 02: 2lpm hs and prn  Covid status: never vax / infected  Lung cancer screening: referred back 08/29/21 Rec My office will be contacting you by phone for referral to consider starting back on lung cancer screening  For "allergies"  = zyrtec 10 mg in pm as needed  Zpak  Make sure you check your oxygen saturation  AT  your highest level of activity (not after you stop)   to be sure it stays over 90  CT chest 03/20/22   centrilobular emphysema   06/25/2023  f/u ov/[place] office/Brian Parsons re: ***  maint on *** did *** bring meds as requested  No chief complaint on file.   Dyspnea:  *** Cough: *** Sleeping: ***   resp cc  SABA use: *** 02: ***  Lung cancer screening: ***   No obvious day to day or daytime variability or assoc excess/ purulent sputum or mucus plugs or hemoptysis or cp or chest tightness, subjective wheeze or overt sinus or hb symptoms.    Also denies any obvious fluctuation of symptoms with weather or environmental changes or other aggravating or alleviating factors except as outlined above   No unusual exposure hx or h/o childhood pna/ asthma or knowledge of premature birth.  Current Allergies, Complete Past Medical History, Past Surgical History, Family History, and Social History were reviewed in [employer] record.  ROS  The following are not active complaints unless bolded Hoarseness, sore throat, dysphagia, dental problems, itching, sneezing,  nasal congestion or discharge of excess mucus or purulent secretions, ear ache,   fever, chills, sweats, unintended wt loss or wt gain, classically pleuritic or exertional cp,  orthopnea pnd or arm/hand swelling  or leg swelling, presyncope, palpitations, abdominal pain, anorexia, nausea, vomiting, diarrhea  or change in bowel habits or change in bladder habits, change in stools or change in urine, dysuria, hematuria,  rash, arthralgias, visual complaints, headache, numbness, weakness or ataxia or problems with walking or coordination,  change in mood or  memory.        No outpatient medications have been marked as taking for the 06/25/23 encounter (Appointment) with [name], MD.               Past Medical History:  Diagnosis Date   Bilateral leg edema    CHF (congestive heart failure) (HCC)    COPD (chronic obstructive pulmonary disease) (HCC)    Coronary atherosclerosis    Nonobstructive at cardiac catheterization April 2017   Diabetes mellitus without complication (HCC)     Essential hypertension    On home O2    2L N/C prn   Polycythemia         Objective:    06/25/2023        ***  08/29/2021       199  03/11/21 215 lb 0.6 oz (97.5 kg)  11/15/20 217 lb 3.2 oz (98.5 kg)  05/17/20 224 lb 12.8 oz (102 kg)  Vital signs reviewed  06/25/2023  - Note at rest 02 sats  ***% on ***   General appearance:    ***       Mod barr***        Assessment

## 2023-06-25 ENCOUNTER — Encounter: Payer: Self-pay | Admitting: Internal Medicine

## 2023-06-25 ENCOUNTER — Ambulatory Visit: Payer: Medicare HMO | Admitting: Internal Medicine

## 2023-08-08 ENCOUNTER — Other Ambulatory Visit: Payer: Self-pay

## 2023-08-08 ENCOUNTER — Encounter (HOSPITAL_COMMUNITY): Payer: Self-pay | Admitting: Emergency Medicine

## 2023-08-08 ENCOUNTER — Inpatient Hospital Stay (HOSPITAL_COMMUNITY)
Admission: EM | Admit: 2023-08-08 | Discharge: 2023-08-11 | DRG: 189 | Disposition: A | Discharge status: Home / Self Care | Attending: Family Medicine | Admitting: Family Medicine

## 2023-08-08 ENCOUNTER — Emergency Department (HOSPITAL_COMMUNITY)

## 2023-08-08 ENCOUNTER — Encounter (HOSPITAL_COMMUNITY): Payer: Self-pay | Admitting: Hematology

## 2023-08-08 DIAGNOSIS — D72829 Elevated white blood cell count, unspecified: Secondary | ICD-10-CM | POA: Diagnosis present

## 2023-08-08 DIAGNOSIS — I498 Other specified cardiac arrhythmias: Secondary | ICD-10-CM | POA: Diagnosis not present

## 2023-08-08 DIAGNOSIS — I4892 Unspecified atrial flutter: Principal | ICD-10-CM

## 2023-08-08 DIAGNOSIS — Z794 Long term (current) use of insulin: Secondary | ICD-10-CM

## 2023-08-08 DIAGNOSIS — Z825 Family history of asthma and other chronic lower respiratory diseases: Secondary | ICD-10-CM

## 2023-08-08 DIAGNOSIS — J441 Chronic obstructive pulmonary disease with (acute) exacerbation: Secondary | ICD-10-CM | POA: Diagnosis present

## 2023-08-08 DIAGNOSIS — I2723 Pulmonary hypertension due to lung diseases and hypoxia: Secondary | ICD-10-CM | POA: Diagnosis present

## 2023-08-08 DIAGNOSIS — Z79899 Other long term (current) drug therapy: Secondary | ICD-10-CM

## 2023-08-08 DIAGNOSIS — E874 Mixed disorder of acid-base balance: Secondary | ICD-10-CM | POA: Diagnosis present

## 2023-08-08 DIAGNOSIS — F17210 Nicotine dependence, cigarettes, uncomplicated: Secondary | ICD-10-CM | POA: Diagnosis present

## 2023-08-08 DIAGNOSIS — Z888 Allergy status to other drugs, medicaments and biological substances status: Secondary | ICD-10-CM

## 2023-08-08 DIAGNOSIS — N1832 Chronic kidney disease, stage 3b: Secondary | ICD-10-CM | POA: Diagnosis present

## 2023-08-08 DIAGNOSIS — Z86718 Personal history of other venous thrombosis and embolism: Secondary | ICD-10-CM

## 2023-08-08 DIAGNOSIS — J9622 Acute and chronic respiratory failure with hypercapnia: Secondary | ICD-10-CM | POA: Diagnosis present

## 2023-08-08 DIAGNOSIS — J9621 Acute and chronic respiratory failure with hypoxia: Secondary | ICD-10-CM | POA: Diagnosis present

## 2023-08-08 DIAGNOSIS — Z823 Family history of stroke: Secondary | ICD-10-CM | POA: Diagnosis not present

## 2023-08-08 DIAGNOSIS — Z7901 Long term (current) use of anticoagulants: Secondary | ICD-10-CM

## 2023-08-08 DIAGNOSIS — R54 Age-related physical debility: Secondary | ICD-10-CM | POA: Diagnosis present

## 2023-08-08 DIAGNOSIS — I5031 Acute diastolic (congestive) heart failure: Secondary | ICD-10-CM | POA: Diagnosis not present

## 2023-08-08 DIAGNOSIS — Z66 Do not resuscitate: Secondary | ICD-10-CM | POA: Diagnosis present

## 2023-08-08 DIAGNOSIS — E1122 Type 2 diabetes mellitus with diabetic chronic kidney disease: Secondary | ICD-10-CM | POA: Diagnosis present

## 2023-08-08 DIAGNOSIS — R296 Repeated falls: Secondary | ICD-10-CM | POA: Diagnosis present

## 2023-08-08 DIAGNOSIS — D751 Secondary polycythemia: Secondary | ICD-10-CM | POA: Diagnosis present

## 2023-08-08 DIAGNOSIS — E1165 Type 2 diabetes mellitus with hyperglycemia: Secondary | ICD-10-CM | POA: Diagnosis not present

## 2023-08-08 DIAGNOSIS — J9620 Acute and chronic respiratory failure, unspecified whether with hypoxia or hypercapnia: Secondary | ICD-10-CM | POA: Diagnosis present

## 2023-08-08 DIAGNOSIS — J9811 Atelectasis: Secondary | ICD-10-CM | POA: Diagnosis present

## 2023-08-08 DIAGNOSIS — T380X5A Adverse effect of glucocorticoids and synthetic analogues, initial encounter: Secondary | ICD-10-CM | POA: Diagnosis not present

## 2023-08-08 DIAGNOSIS — I2510 Atherosclerotic heart disease of native coronary artery without angina pectoris: Secondary | ICD-10-CM | POA: Diagnosis present

## 2023-08-08 DIAGNOSIS — I2781 Cor pulmonale (chronic): Secondary | ICD-10-CM | POA: Diagnosis present

## 2023-08-08 DIAGNOSIS — I5042 Chronic combined systolic (congestive) and diastolic (congestive) heart failure: Secondary | ICD-10-CM | POA: Diagnosis present

## 2023-08-08 DIAGNOSIS — Z7982 Long term (current) use of aspirin: Secondary | ICD-10-CM

## 2023-08-08 DIAGNOSIS — Y92239 Unspecified place in hospital as the place of occurrence of the external cause: Secondary | ICD-10-CM | POA: Diagnosis not present

## 2023-08-08 DIAGNOSIS — I4891 Unspecified atrial fibrillation: Secondary | ICD-10-CM | POA: Diagnosis not present

## 2023-08-08 DIAGNOSIS — I130 Hypertensive heart and chronic kidney disease with heart failure and stage 1 through stage 4 chronic kidney disease, or unspecified chronic kidney disease: Secondary | ICD-10-CM | POA: Diagnosis present

## 2023-08-08 DIAGNOSIS — I4510 Unspecified right bundle-branch block: Secondary | ICD-10-CM | POA: Diagnosis present

## 2023-08-08 DIAGNOSIS — Z880 Allergy status to penicillin: Secondary | ICD-10-CM

## 2023-08-08 DIAGNOSIS — N1831 Chronic kidney disease, stage 3a: Secondary | ICD-10-CM | POA: Diagnosis not present

## 2023-08-08 DIAGNOSIS — I272 Pulmonary hypertension, unspecified: Secondary | ICD-10-CM | POA: Diagnosis not present

## 2023-08-08 DIAGNOSIS — Z9981 Dependence on supplemental oxygen: Secondary | ICD-10-CM | POA: Diagnosis not present

## 2023-08-08 DIAGNOSIS — R0609 Other forms of dyspnea: Secondary | ICD-10-CM | POA: Diagnosis not present

## 2023-08-08 LAB — COMPREHENSIVE METABOLIC PANEL WITH GFR
ALT: 15 U/L (ref 0–44)
AST: 19 U/L (ref 15–41)
Albumin: 3.4 g/dL — ABNORMAL LOW (ref 3.5–5.0)
Alkaline Phosphatase: 86 U/L (ref 38–126)
BUN: 76 mg/dL — ABNORMAL HIGH (ref 8–23)
CO2: >45 mmol/L — ABNORMAL HIGH (ref 22–32)
Calcium: 9.5 mg/dL (ref 8.9–10.3)
Chloride: 68 mmol/L — ABNORMAL LOW (ref 98–111)
Creatinine, Ser: 1.48 mg/dL — ABNORMAL HIGH (ref 0.61–1.24)
GFR, Estimated: 49 mL/min — ABNORMAL LOW
Glucose, Bld: 117 mg/dL — ABNORMAL HIGH (ref 70–99)
Potassium: 3.7 mmol/L (ref 3.5–5.1)
Sodium: 134 mmol/L — ABNORMAL LOW (ref 135–145)
Total Bilirubin: 1.3 mg/dL — ABNORMAL HIGH (ref 0.0–1.2)
Total Protein: 7.1 g/dL (ref 6.5–8.1)

## 2023-08-08 LAB — SARS CORONAVIRUS 2 BY RT PCR: SARS Coronavirus 2 by RT PCR: NEGATIVE

## 2023-08-08 LAB — CBC WITH DIFFERENTIAL/PLATELET
Abs Immature Granulocytes: 0.09 10*3/uL — ABNORMAL HIGH (ref 0.00–0.07)
Basophils Absolute: 0 10*3/uL (ref 0.0–0.1)
Basophils Relative: 0 %
Eosinophils Absolute: 0.1 10*3/uL (ref 0.0–0.5)
Eosinophils Relative: 1 %
HCT: 56.2 % — ABNORMAL HIGH (ref 39.0–52.0)
Hemoglobin: 17.3 g/dL — ABNORMAL HIGH (ref 13.0–17.0)
Immature Granulocytes: 1 %
Lymphocytes Relative: 13 %
Lymphs Abs: 1.5 10*3/uL (ref 0.7–4.0)
MCH: 29.8 pg (ref 26.0–34.0)
MCHC: 30.8 g/dL (ref 30.0–36.0)
MCV: 96.9 fL (ref 80.0–100.0)
Monocytes Absolute: 1.2 10*3/uL — ABNORMAL HIGH (ref 0.1–1.0)
Monocytes Relative: 10 %
Neutro Abs: 8.6 10*3/uL — ABNORMAL HIGH (ref 1.7–7.7)
Neutrophils Relative %: 75 %
Platelets: 198 10*3/uL (ref 150–400)
RBC: 5.8 MIL/uL (ref 4.22–5.81)
RDW: 14.6 % (ref 11.5–15.5)
WBC: 11.5 10*3/uL — ABNORMAL HIGH (ref 4.0–10.5)
nRBC: 0 % (ref 0.0–0.2)

## 2023-08-08 LAB — BLOOD GAS, VENOUS
Acid-Base Excess: 39.9 mmol/L — ABNORMAL HIGH (ref 0.0–2.0)
Bicarbonate: 76.1 mmol/L — ABNORMAL HIGH (ref 20.0–28.0)
Drawn by: 69769
O2 Saturation: 33.6 %
Patient temperature: 36.8
pCO2, Ven: 111 mmHg (ref 44–60)
pH, Ven: 7.44 — ABNORMAL HIGH (ref 7.25–7.43)
pO2, Ven: <31 mmHg — CL (ref 32–45)

## 2023-08-08 LAB — MRSA NEXT GEN BY PCR, NASAL: MRSA by PCR Next Gen: NOT DETECTED

## 2023-08-08 LAB — BRAIN NATRIURETIC PEPTIDE: B Natriuretic Peptide: 48 pg/mL (ref 0.0–100.0)

## 2023-08-08 LAB — TROPONIN I (HIGH SENSITIVITY)
Troponin I (High Sensitivity): 27 ng/L — ABNORMAL HIGH
Troponin I (High Sensitivity): 23 ng/L — ABNORMAL HIGH (ref <18)

## 2023-08-08 MED ORDER — LORAZEPAM 0.5 MG PO TABS
0.5000 mg | ORAL_TABLET | Freq: Every day | ORAL | Status: DC | PRN
  Administered 2023-08-08: 0.5 mg via ORAL
  Filled 2023-08-08: qty 1

## 2023-08-08 MED ORDER — CHLORHEXIDINE GLUCONATE CLOTH 2 % EX PADS
6.0000 | MEDICATED_PAD | Freq: Every day | CUTANEOUS | Status: DC
  Administered 2023-08-08 – 2023-08-10 (×3): 6 via TOPICAL

## 2023-08-08 MED ORDER — DOXYCYCLINE HYCLATE 100 MG PO TABS: 100.0000 mg | ORAL_TABLET | Freq: Two times a day (BID) | ORAL | 0 refills | Status: DC

## 2023-08-08 MED ORDER — SODIUM CHLORIDE 0.9 % IV SOLN
2.0000 g | INTRAVENOUS | Status: DC
  Administered 2023-08-08 – 2023-08-10 (×2): 2 g via INTRAVENOUS
  Filled 2023-08-08 (×2): qty 20

## 2023-08-08 MED ORDER — TORSEMIDE 20 MG PO TABS
80.0000 mg | ORAL_TABLET | Freq: Two times a day (BID) | ORAL | Status: DC
  Administered 2023-08-09 – 2023-08-11 (×4): 80 mg via ORAL
  Filled 2023-08-08 (×4): qty 4

## 2023-08-08 MED ORDER — ACETAMINOPHEN 650 MG RE SUPP: 650.0000 mg | Freq: Four times a day (QID) | RECTAL | Status: DC | PRN

## 2023-08-08 MED ORDER — METHYLPREDNISOLONE SODIUM SUCC 125 MG IJ SOLR
125.0000 mg | Freq: Once | INTRAMUSCULAR | Status: AC
  Administered 2023-08-08: 125 mg via INTRAVENOUS
  Filled 2023-08-08: qty 2

## 2023-08-08 MED ORDER — MAGNESIUM SULFATE 2 GM/50ML IV SOLN
2.0000 g | Freq: Once | INTRAVENOUS | Status: AC
  Administered 2023-08-08: 2 g via INTRAVENOUS
  Filled 2023-08-08: qty 50

## 2023-08-08 MED ORDER — HYDRALAZINE HCL 20 MG/ML IJ SOLN: 5.0000 mg | INTRAMUSCULAR | Status: DC | PRN

## 2023-08-08 MED ORDER — ALLOPURINOL 100 MG PO TABS
100.0000 mg | ORAL_TABLET | Freq: Two times a day (BID) | ORAL | Status: DC
  Administered 2023-08-08 – 2023-08-11 (×6): 100 mg via ORAL
  Filled 2023-08-08 (×6): qty 1

## 2023-08-08 MED ORDER — VITAMIN B-12 1000 MCG PO TABS
1000.0000 ug | ORAL_TABLET | Freq: Every day | ORAL | Status: DC
  Administered 2023-08-09 – 2023-08-11 (×3): 1000 ug via ORAL
  Filled 2023-08-08 (×3): qty 1

## 2023-08-08 MED ORDER — PANTOPRAZOLE SODIUM 40 MG PO TBEC
40.0000 mg | DELAYED_RELEASE_TABLET | Freq: Every day | ORAL | Status: DC
  Administered 2023-08-08 – 2023-08-11 (×4): 40 mg via ORAL
  Filled 2023-08-08 (×4): qty 1

## 2023-08-08 MED ORDER — ALBUTEROL SULFATE (2.5 MG/3ML) 0.083% IN NEBU: 2.5000 mg | INHALATION_SOLUTION | RESPIRATORY_TRACT | Status: DC | PRN

## 2023-08-08 MED ORDER — IPRATROPIUM-ALBUTEROL 0.5-2.5 (3) MG/3ML IN SOLN
3.0000 mL | Freq: Once | RESPIRATORY_TRACT | Status: AC
  Administered 2023-08-08: 3 mL via RESPIRATORY_TRACT
  Filled 2023-08-08: qty 3

## 2023-08-08 MED ORDER — ASPIRIN 81 MG PO TBEC: 81.0000 mg | DELAYED_RELEASE_TABLET | Freq: Every day | ORAL | Status: DC

## 2023-08-08 MED ORDER — METOPROLOL SUCCINATE ER 25 MG PO TB24: 12.5000 mg | ORAL_TABLET | Freq: Every day | ORAL | Status: DC

## 2023-08-08 MED ORDER — ATORVASTATIN CALCIUM 20 MG PO TABS
20.0000 mg | ORAL_TABLET | Freq: Every day | ORAL | Status: DC
  Administered 2023-08-09 – 2023-08-11 (×3): 20 mg via ORAL
  Filled 2023-08-08 (×3): qty 1

## 2023-08-08 MED ORDER — DOXEPIN HCL 10 MG PO CAPS
10.0000 mg | ORAL_CAPSULE | Freq: Every day | ORAL | Status: DC
  Administered 2023-08-08 – 2023-08-10 (×3): 10 mg via ORAL
  Filled 2023-08-08 (×5): qty 1

## 2023-08-08 MED ORDER — ENOXAPARIN SODIUM 40 MG/0.4ML IJ SOSY
40.0000 mg | PREFILLED_SYRINGE | INTRAMUSCULAR | Status: DC
  Administered 2023-08-08: 40 mg via SUBCUTANEOUS
  Filled 2023-08-08: qty 0.4

## 2023-08-08 MED ORDER — METOLAZONE 5 MG PO TABS
2.5000 mg | ORAL_TABLET | ORAL | Status: DC
  Administered 2023-08-09 – 2023-08-11 (×2): 2.5 mg via ORAL
  Filled 2023-08-08 (×2): qty 1

## 2023-08-08 MED ORDER — PREDNISONE 50 MG PO TABS: 50.0000 mg | ORAL_TABLET | Freq: Every day | ORAL | 0 refills | Status: DC

## 2023-08-08 MED ORDER — BUSPIRONE HCL 15 MG PO TABS
7.5000 mg | ORAL_TABLET | Freq: Two times a day (BID) | ORAL | Status: DC
  Administered 2023-08-09 – 2023-08-11 (×5): 7.5 mg via ORAL
  Filled 2023-08-08: qty 2
  Filled 2023-08-08 (×4): qty 1

## 2023-08-08 MED ORDER — FUROSEMIDE 10 MG/ML IJ SOLN
40.0000 mg | Freq: Once | INTRAMUSCULAR | Status: AC
  Administered 2023-08-08: 40 mg via INTRAVENOUS
  Filled 2023-08-08: qty 4

## 2023-08-08 MED ORDER — APIXABAN 5 MG PO TABS: 5.0000 mg | ORAL_TABLET | Freq: Two times a day (BID) | ORAL | 0 refills | Status: DC

## 2023-08-08 MED ORDER — METHYLPREDNISOLONE SODIUM SUCC 125 MG IJ SOLR
125.0000 mg | INTRAMUSCULAR | Status: DC
  Filled 2023-08-08: qty 2

## 2023-08-08 MED ORDER — IPRATROPIUM-ALBUTEROL 0.5-2.5 (3) MG/3ML IN SOLN
3.0000 mL | RESPIRATORY_TRACT | Status: DC
  Administered 2023-08-08 – 2023-08-10 (×12): 3 mL via RESPIRATORY_TRACT
  Filled 2023-08-08 (×12): qty 3

## 2023-08-08 MED ORDER — AZELASTINE HCL 0.1 % NA SOLN
1.0000 | Freq: Two times a day (BID) | NASAL | Status: DC
  Administered 2023-08-09 – 2023-08-11 (×5): 1 via NASAL
  Filled 2023-08-08 (×2): qty 30

## 2023-08-08 MED ORDER — DILTIAZEM HCL 25 MG/5ML IV SOLN
10.0000 mg | INTRAVENOUS | Status: DC | PRN
  Administered 2023-08-09: 10 mg via INTRAVENOUS
  Filled 2023-08-08 (×2): qty 5

## 2023-08-08 MED ORDER — ACETAMINOPHEN 325 MG PO TABS
650.0000 mg | ORAL_TABLET | Freq: Four times a day (QID) | ORAL | Status: DC | PRN
  Administered 2023-08-09: 650 mg via ORAL
  Filled 2023-08-08: qty 2

## 2023-08-08 NOTE — Discharge Instructions (Addendum)
 We evaluated you for your shortness of breath.  Your symptoms are likely due to a COPD exacerbation.  We noticed your heart was in a rhythm called atrial flutter.  We discussed starting a blood thinner to prevent stroke.  You agreed to this.  It is very important to follow-up with your primary care doctor.  The blood thinner can also increase risk of bleeding so if you hit your head, blood in your stool, cough up blood, or have any other symptoms of bleeding you must return to the emergency department.   IMPORTANT INFORMATION: PAY CLOSE ATTENTION   PHYSICIAN DISCHARGE INSTRUCTIONS  Follow with Primary care provider  Jonah Negus, NP  and other consultants as instructed by your Hospitalist Physician  SEEK MEDICAL CARE OR RETURN TO EMERGENCY ROOM IF SYMPTOMS COME BACK, WORSEN OR NEW PROBLEM DEVELOPS   Please note: You were cared for by a hospitalist during your hospital stay. Every effort will be made to forward records to your primary care provider.  You can request that your primary care provider send for your hospital records if they have not received them.  Once you are discharged, your primary care physician will handle any further medical issues. Please note that NO REFILLS for any discharge medications will be authorized once you are discharged, as it is imperative that you return to your primary care physician (or establish a relationship with a primary care physician if you do not have one) for your post hospital discharge needs so that they can reassess your need for medications and monitor your lab values.  Please get a complete blood count and chemistry panel checked by your Primary MD at your next visit, and again as instructed by your Primary MD.  Get Medicines reviewed and adjusted: Please take all your medications with you for your next visit with your Primary MD  Laboratory/radiological data: Please request your Primary MD to go over all hospital tests and  procedure/radiological results at the follow up, please ask your primary care provider to get all Hospital records sent to his/her office.  In some cases, they will be blood work, cultures and biopsy results pending at the time of your discharge. Please request that your primary care provider follow up on these results.  If you are diabetic, please bring your blood sugar readings with you to your follow up appointment with primary care.    Please call and make your follow up appointments as soon as possible.    Also Note the following: If you experience worsening of your admission symptoms, develop shortness of breath, life threatening emergency, suicidal or homicidal thoughts you must seek medical attention immediately by calling 911 or calling your MD immediately  if symptoms less severe.  You must read complete instructions/literature along with all the possible adverse reactions/side effects for all the Medicines you take and that have been prescribed to you. Take any new Medicines after you have completely understood and accpet all the possible adverse reactions/side effects.   Do not drive when taking Pain medications or sleeping medications (Benzodiazepines)  Do not take more than prescribed Pain, Sleep and Anxiety Medications. It is not advisable to combine anxiety,sleep and pain medications without talking with your primary care practitioner  Special Instructions: If you have smoked or chewed Tobacco  in the last 2 yrs please stop smoking, stop any regular Alcohol  and or any Recreational drug use.  Wear Seat belts while driving.  Do not drive if taking any narcotic, mind  altering or controlled substances or recreational drugs or alcohol.

## 2023-08-08 NOTE — ED Provider Notes (Signed)
 [place] [facility]ERGENC[facility] DEPARTMENT AT [facility] Provider Note  CSN: [id number] Arrival date & time: 08/08/23 1128  Chief Complaint(s) Shortness of Breath  HPI Brian Parsons is a 73 y.o. male history of CHF, COPD, diabetes, on home oxygen  presenting to the emergency department shortness of breath.  Patient reports increasing shortness of breath for 1 week, also noticed some lower extremity swelling.  Uses 4 L of oxygen  normally at home.  Called paramedics who found patient was mid 64s on his 4 L of oxygen , improved after receiving breathing treatment.  He reports feeling a bit better.  No chest pain, fevers or chills.  Reports some productive cough.  Reports he has been compliant with all of his medications.   Past Medical History Past Medical History:  Diagnosis Date   Bilateral leg edema    CHF (congestive heart failure) (HCC)    COPD (chronic obstructive pulmonary disease) (HCC)    Coronary atherosclerosis    Nonobstructive at cardiac catheterization April 2017   Diabetes mellitus without complication (HCC)    Essential hypertension    On home O2    2L N/C prn   Polycythemia    Patient Active Problem List   Diagnosis Date Noted   Panic attacks 03/22/2023   Chronic right-sided heart failure (HCC) 03/23/2022   Atrial arrhythmia 03/23/2022   Abnormal ECG 03/23/2022   Peripheral edema 03/23/2022   Malnutrition of moderate degree 03/21/2022   Acute respiratory failure with hypoxia (HCC) 03/19/2022   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 / group D symptom/ risk 10/14/2019   Type 2 diabetes mellitus with stage 3a chronic kidney disease, without long-term current use of insulin  (HCC) 03/06/2019   Mixed hyperlipidemia 03/06/2019   Class 2 severe obesity due to excess calories with serious comorbidity and body  mass index (BMI) of 36.0 to 36.9 in adult (HCC) 03/06/2019   Benign hypertensive kidney disease with chronic kidney disease 01/07/2019   Chronic kidney disease, stage III (moderate) (HCC) 01/07/2019   Edema 10/01/2017   CHF, acute on chronic (HCC) 04/29/2016   AKI (acute kidney injury) (HCC) 04/29/2016   CHF exacerbation (HCC) 04/29/2016   CHF (congestive heart failure) (HCC) 02/24/2016   Tobacco abuse 01/05/2016   Exertional dyspnea    Abnormal nuclear stress test 08/04/2015   Chronic diastolic heart failure (HCC) 08/04/2015   Essential hypertension, benign 08/04/2015   Polycythemia, secondary 08/04/2015   COPD exacerbation (HCC) 08/04/2015   Cigarette smoker 08/02/2015   Acute on chronic renal insufficiency 07/28/2015   Acute diastolic CHF (congestive heart failure) (HCC) 07/28/2015   Acute on chronic respiratory failure (HCC) 08/04/2014   Hypoxia 08/03/2014   Lower extremity edema 08/03/2014   Elevated troponin 08/03/2014   Dyspnea 08/03/2014   Home Medication(s) Prior to Admission medications   Medication Sig Start Date End Date Taking? Authorizing Provider  apixaban  (ELIQUIS ) 5 MG TABS tablet Take 1 tablet (5 mg total) by mouth 2 (two) times daily. 08/08/23 09/07/23 Yes Mordecai Applebaum, MD  doxycycline  (VIBRA -TABS) 100 MG tablet Take 1 tablet (100 mg total) by mouth 2 (two) times daily for 7 days. 08/08/23 08/15/23 Yes Mordecai Applebaum, MD  predniSONE  (DELTASONE ) 50 MG tablet Take 1 tablet (50 mg total) by mouth daily with breakfast for 5 days. 08/08/23 08/13/23 Yes Mordecai Applebaum, MD  albuterol  (ACCUNEB ) 0.63 MG/3ML  nebulizer solution Take 3 mLs (0.63 mg total) by nebulization every 4 (four) hours as needed. 10/14/19   Diamond Formica, MD  albuterol  (VENTOLIN  HFA) 108 (90 Base) MCG/ACT inhaler Inhale 2 puffs into the lungs every 6 (six) hours as needed for wheezing or shortness of breath. 05/17/22   Diamond Formica, MD  allopurinol  (ZYLOPRIM ) 100 MG tablet Take 200 mg by  mouth 2 (two) times daily.  08/06/17   [provider]  ALPRAZolam  (XANAX ) 0.25 MG tablet Take 0.25 mg by mouth daily. 02/13/23   [provider]  aspirin  EC 81 MG tablet Take 81 mg by mouth daily.    [provider]  atorvastatin  (LIPITOR) 20 MG tablet Take 20 mg by mouth daily at 6 PM.  03/01/17 05/17/20  [provider]  azelastine  (ASTELIN ) 0.1 % nasal spray 1 puff in each nostril 07/19/20   [provider]  Blood Glucose Monitoring Suppl (TRUE METRIX METER) DEVI See admin instructions. 07/07/19   [provider]  busPIRone  (BUSPAR ) 5 MG tablet Take 5 mg by mouth 2 (two) times daily. 02/27/23   [provider]  doxepin  (SINEQUAN ) 10 MG capsule Take 10 mg by mouth at bedtime. 12/06/22   [provider]  DULoxetine  (CYMBALTA ) 30 MG capsule Take 30 mg by mouth daily. 10/31/21   [provider]  escitalopram  (LEXAPRO ) 20 MG tablet Take 20 mg by mouth daily.    [provider]  fluticasone  (FLONASE ) 50 MCG/ACT nasal spray 1 spray in each nostril 07/19/20   [provider]  Fluticasone -Umeclidin-Vilant (TRELEGY ELLIPTA ) 100-62.5-25 MCG/ACT AEPB One click each am 03/11/21   Wert, Michael B, MD  ipratropium-albuterol  (DUONEB) 0.5-2.5 (3) MG/3ML SOLN Take by nebulization. 04/18/21   [provider]  LINZESS  145 MCG CAPS capsule Take 145 mcg by mouth daily. 09/08/21   [provider]  Magnesium  Gluconate 500 (27 Mg) MG TABS Take 500 mg by mouth in the morning and at bedtime.     [provider]  metolazone  (ZAROXOLYN ) 2.5 MG tablet Take 2.5 mg by mouth every other day.  10/01/18   [provider]  metoprolol  succinate (TOPROL -XL) 25 MG 24 hr tablet Take 0.5 tablets (12.5 mg total) by mouth daily. 03/26/22   Wynetta Heckle, MD  potassium chloride  (MICRO-K ) 10 MEQ CR capsule TAKE 1 CAPSULE BY MOUTH ONCE DAILY WITH FOOD    [provider]  potassium chloride  SA (KLOR-CON  M) 20  MEQ tablet Take 20 mEq by mouth daily. 02/21/23   [provider]  sildenafil (VIAGRA) 50 MG tablet Take by mouth. 04/18/21   [provider]  torsemide  (DEMADEX ) 20 MG tablet Take 80 mg by mouth 2 (two) times daily.     [provider]  vitamin B-12 (CYANOCOBALAMIN ) 1000 MCG tablet Take 1,000 mcg by mouth daily.    [provider]  ZYRTEC ALLERGY 10 MG tablet Take 10 mg by mouth daily. 09/27/22   [provider]  Past Surgical History Past Surgical History:  Procedure Laterality Date   CARDIAC CATHETERIZATION N/A 08/10/2015   Procedure: Left Heart Cath and Coronary Angiography;  Surgeon: Millicent Ally, MD;  [facility] INVAS[facility] CV LAB;  Service: Cardiovascular;  Laterality: N/A;   RIGHT/LEFT HEART CATH AND CORONARY ANGIOGRAPHY N/A 03/24/2022   Procedure: RIGHT/LEFT HEART CATH AND CORONARY ANGIOGRAPHY;  Surgeon: Mardell Shade, MD;  Location: [facility] INVASIVE CV LAB;  Service: Cardiovascular;  Laterality: N/A;   Family History Family History  Problem Relation Age of Onset   Stroke Mother    Emphysema Father     Social History Social History   Tobacco Use   Smoking status: Every Day    Current packs/day: 0.25    Average packs/day: 0.3 packs/day for 62.3 years (15.6 ttl pk-yrs)    Types: Cigarettes    Start date: 04/17/1961   Smokeless tobacco: Never  Vaping Use   Vaping status: Never Used  Substance Use Topics   Alcohol use: No    Alcohol/week: 0.0 standard drinks of alcohol    Comment: No EtOH for 7 years   Drug use: No   Allergies Amoxicillin, Bupropion, Citalopram, Escitalopram , Other, Penicillin g, Penicillins, Trazodone hcl, and Varenicline tartrate  Review of Systems Review of Systems  All other systems reviewed and are negative.   Physical Exam Vital Signs  I have reviewed the triage vital  signs BP (!) 127/91   Pulse 99   Temp 98.2 F (36.8 C) (Oral)   Resp 19   Ht 6' (1.829 m)   Wt 83.9 kg   SpO2 93%   BMI 25.09 kg/m  Physical Exam Vitals and nursing note reviewed.  Constitutional:      General: He is not in acute distress.    Appearance: Normal appearance.  HENT:     Mouth/Throat:     Mouth: Mucous membranes are moist.  Eyes:     Conjunctiva/sclera: Conjunctivae normal.  Cardiovascular:     Rate and Rhythm: Normal rate and regular rhythm.  Pulmonary:     Effort: Pulmonary effort is normal. No respiratory distress.     Breath sounds: Examination of the right-upper field reveals wheezing. Examination of the left-upper field reveals wheezing. Examination of the right-middle field reveals wheezing. Examination of the left-middle field reveals wheezing. Examination of the right-lower field reveals wheezing. Examination of the left-lower field reveals wheezing. Wheezing present.  Abdominal:     General: Abdomen is flat.     Palpations: Abdomen is soft.     Tenderness: There is no abdominal tenderness.  Musculoskeletal:     Right lower leg: Edema present.     Left lower leg: Edema present.  Skin:    General: Skin is warm and dry.     Capillary Refill: Capillary refill takes less than 2 seconds.  Neurological:     Mental Status: He is alert and oriented to person, place, and time. Mental status is at baseline.  Psychiatric:        Mood and Affect: Mood normal.        Behavior: Behavior normal.     ED Results and Treatments Labs (all labs ordered are listed, but only abnormal results are displayed) Labs Reviewed  COMPREHENSIVE METABOLIC PANEL WITH GFR - Abnormal; Notable for the following components:      Result Value   Sodium 134 (*)    Chloride 68 (*)    CO2 >45 (*)    Glucose, Bld 117 (*)    BUN  76 (*)    Creatinine, Ser 1.48 (*)    Albumin 3.4 (*)    Total Bilirubin 1.3 (*)    GFR, Estimated 49 (*)    All other components within normal limits   CBC WITH DIFFERENTIAL/PLATELET - Abnormal; Notable for the following components:   WBC 11.5 (*)    Hemoglobin 17.3 (*)    HCT 56.2 (*)    Neutro Abs 8.6 (*)    Monocytes Absolute 1.2 (*)    Abs Immature Granulocytes 0.09 (*)    All other components within normal limits  TROPONIN I (HIGH SENSITIVITY) - Abnormal; Notable for the following components:   Troponin I (High Sensitivity) 27 (*)    All other components within normal limits  TROPONIN I (HIGH SENSITIVITY) - Abnormal; Notable for the following components:   Troponin I (High Sensitivity) 23 (*)    All other components within normal limits  BRAIN NATRIURETIC PEPTIDE  BLOOD GAS, VENOUS                                                                                                                          Radiology DG Chest Port 1 View Result Date: 08/08/2023 CLINICAL DATA:  Dyspnea EXAM: PORTABLE CHEST 1 VIEW COMPARISON:  March 19, 2022 FINDINGS: Streaky bibasilar atelectasis. No focal airspace consolidation, pleural effusion, or pneumothorax. Redemonstrated cardiomegaly. Aortic atherosclerosis. No acute fracture or destructive lesions. Multilevel thoracic osteophytosis. IMPRESSION: Unchanged cardiomegaly with streaky bibasilar atelectasis. Electronically Signed   By: Rance Burrows M.D.   On: 08/08/2023 12:19    Pertinent labs & imaging results that were available during my care of the patient were reviewed by me and considered in my medical decision making (see MDM for details).  Medications Ordered in ED Medications  ipratropium-albuterol  (DUONEB) 0.5-2.5 (3) MG/3ML nebulizer solution 3 mL (has no administration in time range)  ipratropium-albuterol  (DUONEB) 0.5-2.5 (3) MG/3ML nebulizer solution 3 mL (3 mLs Nebulization Given 08/08/23 1246)  methylPREDNISolone  sodium succinate (SOLU-MEDROL ) 125 mg/2 mL injection 125 mg (125 mg Intravenous Given 08/08/23 1241)  furosemide  (LASIX ) injection 40 mg (40 mg Intravenous Given 08/08/23  1241)                                                                                                                                     Procedures Procedures  (including critical care time)  Medical Decision Making / ED Course  MDM:  73 year old presenting to the emergency department shortness of breath.  Patient well-appearing, physical examination with some mild diffuse wheezing.  Vital signs otherwise reassuring.  Not hypoxic on home oxygen .  No tachycardia.  No fever.  Suspect symptoms most likely due to COPD exacerbation.  May have some component of volume overload however his BNP is normal.  Chest x-ray also not suggestive of CHF exacerbation.  Labs notable for chronic elevation in CO2 likely related to underlying COPD.  Will check VBG.  Patient also has mild leukocytosis.  Patient received steroids, DuoNeb Lasix , will reassess.  Low concern for other process such as ACS.  Initial troponin is slightly elevated, will obtain delta troponin but will likely related to hypoxia and COPD exacerbation rather than ACS.  Considered other process such as pulmonary embolism but no chest pain or pleuritic pain.  Chest x-ray also with no evidence of any pneumonia.   EKG notable for atrial flutter without RVR.  Patient has no symptoms of this.  Not safe to perform cardioversion.  Will start on Eliquis .  Currently rate controlled.  No history of this per patient.  He understands the risk and benefits of Eliquis  and would prefer to start it.  Understands bleeding risk and need to return to the ER for any head injury or signs of bleeding.  Signed out to oncoming provider pending DuoNeb       Lab Tests: -I ordered, reviewed, and interpreted labs.   The pertinent results include:   a 6 Labs Reviewed  COMPREHENSIVE METABOLIC PANEL WITH GFR - Abnormal; Notable for the following components:      Result Value   Sodium 134 (*)    Chloride 68 (*)    CO2 >45 (*)    Glucose, Bld 117 (*)    BUN 76  (*)    Creatinine, Ser 1.48 (*)    Albumin 3.4 (*)    Total Bilirubin 1.3 (*)    GFR, Estimated 49 (*)    All other components within normal limits  CBC WITH DIFFERENTIAL/PLATELET - Abnormal; Notable for the following components:   WBC 11.5 (*)    Hemoglobin 17.3 (*)    HCT 56.2 (*)    Neutro Abs 8.6 (*)    Monocytes Absolute 1.2 (*)    Abs Immature Granulocytes 0.09 (*)    All other components within normal limits  TROPONIN I (HIGH SENSITIVITY) - Abnormal; Notable for the following components:   Troponin I (High Sensitivity) 27 (*)    All other components within normal limits  TROPONIN I (HIGH SENSITIVITY) - Abnormal; Notable for the following components:   Troponin I (High Sensitivity) 23 (*)    All other components within normal limits  BRAIN NATRIURETIC PEPTIDE  BLOOD GAS, VENOUS    Notable for mild troponin elevation  EKG   EKG Interpretation Date/Time:  Wednesday August 08 2023 11:43:05 EDT Ventricular Rate:  105 PR Interval:    QRS Duration:  131 QT Interval:  425 QTC Calculation: 562 R Axis:   -80  Text Interpretation: Atrial flutter with predominant 3:1 AV block Right bundle branch block Inferior infarct, old Lateral leads are also involved Confirmed by Hiawatha Lout ([id number]) on 08/08/2023 2:57:14 PM         Imaging Studies ordered: I ordered imaging studies including CXR On my interpretation imaging demonstrates no acute process I independently visualized and interpreted imaging. I agree with the radiologist interpretation   Medicines ordered and prescription drug management: Meds ordered this  encounter  Medications   ipratropium-albuterol  (DUONEB) 0.5-2.5 (3) MG/3ML nebulizer solution 3 mL   methylPREDNISolone  sodium succinate (SOLU-MEDROL ) 125 mg/2 mL injection 125 mg   furosemide  (LASIX ) injection 40 mg   apixaban  (ELIQUIS ) 5 MG TABS tablet    Sig: Take 1 tablet (5 mg total) by mouth 2 (two) times daily.    Dispense:  60 tablet    Refill:  0    predniSONE  (DELTASONE ) 50 MG tablet    Sig: Take 1 tablet (50 mg total) by mouth daily with breakfast for 5 days.    Dispense:  5 tablet    Refill:  0   doxycycline  (VIBRA -TABS) 100 MG tablet    Sig: Take 1 tablet (100 mg total) by mouth 2 (two) times daily for 7 days.    Dispense:  14 tablet    Refill:  0   ipratropium-albuterol  (DUONEB) 0.5-2.5 (3) MG/3ML nebulizer solution 3 mL    -I have reviewed the patients home medicines and have made adjustments as needed  Social Determinants of Health:  Diagnosis or treatment significantly limited by social determinants of health: current smoker   Reevaluation: After the interventions noted above, I reevaluated the patient and found that their symptoms have improved  Co morbidities that complicate the patient evaluation  Past Medical History:  Diagnosis Date   Bilateral leg edema    CHF (congestive heart failure) (HCC)    COPD (chronic obstructive pulmonary disease) (HCC)    Coronary atherosclerosis    Nonobstructive at cardiac catheterization April 2017   Diabetes mellitus without complication (HCC)    Essential hypertension    On home O2    2L N/C prn   Polycythemia       Dispostion: Disposition decision including need for hospitalization was considered, and patient disposition pending at time of sign out.    Final Clinical Impression(s) / ED Diagnoses Final diagnoses:  Atrial flutter, unspecified type (HCC)  COPD exacerbation (HCC)     This chart was dictated using voice recognition software.  Despite best efforts to proofread,  errors can occur which can change the documentation meaning.    Mordecai Applebaum, MD 08/08/23 [phone]

## 2023-08-08 NOTE — H&P (Signed)
 [facility] H&P   Patient Demographics:    Brian Parsons, is a 73 y.o. male  MRN: [id number]   DOB - Oct 06, 1950  Admit Date - 08/08/2023  Outpatient Primary MD for the patient is Jonah Negus, NP  Referring MD/NP/PA: PA Celeste  Patient coming from: Home  Chief Complaint  Patient presents with   Shortness of Breath      HPI:    Brian Parsons  is a 73 y.o. male,  with a history of DVT tobacco use, severe 4L O2-dependent COPD, secondary polycythemia treated with phlebotomy, chronic HFpEF, HTN, T2DM, nonobstructive CAD - Presents to ED secondary to complaints of shortness of breath, cough, congestion, patient reports symptoms ongoing for last week, reports cough, congestion, reports his girlfriend has been sick a week ago, but she was told she does not have any viral infection, giving worsening of symptoms he came to ED today. - Had significant wheezing in ED, improved with magnesium  sulfate and IV steroids and nebulizer treatment, chest x-ray significant for cardiomegaly, atelectasis, labs were significant for pH of 7.44, PCO2 of 111, sodium of 134, chloride of 68, and creatinine at baseline of 1.48, his white blood cell count at 11.5, hemoglobin at 17.3, EKG concerning for a flutter, [facility] hospitalist consulted to admit.   Review of systems:     A full 10 point Review of Systems was done, except as stated above, all other Review of Systems were negative.   With Past History of the following :    Past Medical History:  Diagnosis Date   Bilateral leg edema    CHF (congestive heart failure) (HCC)    COPD (chronic obstructive pulmonary disease) (HCC)    Coronary atherosclerosis    Nonobstructive at cardiac catheterization April 2017   Diabetes mellitus without complication (HCC)    Essential hypertension    On home O2    2L N/C prn   Polycythemia       Past Surgical  History:  Procedure Laterality Date   CARDIAC CATHETERIZATION N/A 08/10/2015   Procedure: Left Heart Cath and Coronary Angiography;  Surgeon: Millicent Ally, MD;  Location: [facility];  Service: Cardiovascular;  Laterality: N/A;   RIGHT/LEFT HEART CATH AND CORONARY ANGIOGRAPHY N/A 03/24/2022   Procedure: RIGHT/LEFT HEART CATH AND CORONARY ANGIOGRAPHY;  Surgeon: Mardell Shade, MD;  Location: [facility];  Service: Cardiovascular;  Laterality: N/A;      Social History:     Social History   Tobacco Use   Smoking status: Every Day    Current packs/day: 0.25    Average packs/day: 0.3 packs/day for 62.3 years (15.6 ttl pk-yrs)    Types: Cigarettes    Start date: 04/17/1961   Smokeless tobacco: Never  Substance Use Topics   Alcohol use: No    Alcohol/week: 0.0 standard drinks of alcohol    Comment: No EtOH for  7 years        Family History :     Family History  Problem Relation Age of Onset   Stroke Mother    Emphysema Father      Home Medications:   Prior to Admission medications   Medication Sig Start Date End Date Taking? Authorizing Provider  albuterol  (ACCUNEB ) 0.63 MG/3ML nebulizer solution Take 3 mLs (0.63 mg total) by nebulization every 4 (four) hours as needed. 10/14/19  Yes Diamond Formica, MD  albuterol  (VENTOLIN  HFA) 108 (90 Base) MCG/ACT inhaler Inhale 2 puffs into the lungs every 6 (six) hours as needed for wheezing or shortness of breath. 05/17/22  Yes Diamond Formica, MD  allopurinol  (ZYLOPRIM ) 100 MG tablet Take 200 mg by mouth 2 (two) times daily.  08/06/17  Yes [provider]  apixaban  (ELIQUIS ) 5 MG TABS tablet Take 1 tablet (5 mg total) by mouth 2 (two) times daily. 08/08/23 09/07/23 Yes Mordecai Applebaum, MD  aspirin  EC 81 MG tablet Take 81 mg by mouth daily.   Yes [provider]  atorvastatin  (LIPITOR) 20 MG tablet Take 20 mg by mouth daily at 6 PM.  03/01/17 08/08/23 Yes [provider]  azelastine  (ASTELIN )  0.1 % nasal spray Place 1 spray into both nostrils 2 (two) times daily. 07/19/20  Yes [provider]  busPIRone  (BUSPAR ) 7.5 MG tablet Take 7.5 mg by mouth 2 (two) times daily. 04/25/23  Yes [provider]  colchicine 0.6 MG tablet Take 0.6 mg by mouth as needed (gout). 06/25/23  Yes [provider]  doxepin  (SINEQUAN ) 10 MG capsule Take 10 mg by mouth at bedtime. 12/06/22  Yes [provider]  doxycycline  (VIBRA -TABS) 100 MG tablet Take 1 tablet (100 mg total) by mouth 2 (two) times daily for 7 days. 08/08/23 08/15/23 Yes Mordecai Applebaum, MD  fluticasone  (FLONASE ) 50 MCG/ACT nasal spray Place 1 spray into both nostrils daily. 07/19/20  Yes [provider]  Fluticasone -Umeclidin-Vilant (TRELEGY ELLIPTA ) 100-62.5-25 MCG/ACT AEPB One click each am 03/11/21  Yes Diamond Formica, MD  ipratropium-albuterol  (DUONEB) 0.5-2.5 (3) MG/3ML SOLN Take 3 mLs by nebulization every 4 (four) hours as needed. 04/18/21  Yes [provider]  LORazepam  (ATIVAN ) 0.5 MG tablet Take 0.5 mg by mouth daily as needed for anxiety. 06/29/23  Yes [provider]  Magnesium  Gluconate 500 (27 Mg) MG TABS Take 500 mg by mouth in the morning and at bedtime.    Yes [provider]  metolazone  (ZAROXOLYN ) 2.5 MG tablet Take 2.5 mg by mouth every other day.  10/01/18  Yes [provider]  metoprolol  succinate (TOPROL -XL) 25 MG 24 hr tablet Take 0.5 tablets (12.5 mg total) by mouth daily. 03/26/22  Yes Wynetta Heckle, MD  potassium chloride  SA (KLOR-CON  M) 20 MEQ tablet Take 20 mEq by mouth daily. 02/21/23  Yes [provider]  ramelteon (ROZEREM) 8 MG tablet Take 8 mg by mouth at bedtime as needed for sleep. 07/24/23  Yes [provider]  torsemide  (DEMADEX ) 20 MG tablet Take 80 mg by mouth 2 (two) times daily.    Yes [provider]  vitamin B-12 (CYANOCOBALAMIN ) 1000 MCG tablet Take 1,000 mcg by mouth daily.   Yes [provider]   ZYRTEC ALLERGY 10 MG tablet Take 10 mg by mouth daily. 09/27/22  Yes [provider]  ALPRAZolam  (XANAX ) 0.25 MG tablet Take 0.25 mg by mouth daily. 02/13/23   [provider]  Blood Glucose Monitoring  Suppl (TRUE METRIX METER) DEVI See admin instructions. 07/07/19   [provider]  DULoxetine  (CYMBALTA ) 60 MG capsule Take 60 mg by mouth daily. 04/25/23   [provider]  escitalopram  (LEXAPRO ) 20 MG tablet Take 20 mg by mouth daily.    [provider]  LINZESS  145 MCG CAPS capsule Take 145 mcg by mouth daily. 09/08/21   [provider]  potassium chloride  (MICRO-K ) 10 MEQ CR capsule TAKE 1 CAPSULE BY MOUTH ONCE DAILY WITH FOOD    [provider]  sildenafil (VIAGRA) 50 MG tablet Take by mouth. 04/18/21   [provider]  zolpidem (AMBIEN) 10 MG tablet Take 10 mg by mouth at bedtime as needed. 06/29/23   [provider]     Allergies:     Allergies  Allergen Reactions   Citalopram Other (See Comments)    GI bleed   Bupropion Other (See Comments)    Unknown   Escitalopram  Other (See Comments)    Unknown   Penicillins Hives   Trazodone Hcl Other (See Comments)    Unknown   Varenicline Tartrate Other (See Comments)    Bad dreams   Amoxicillin Hives and Rash   Penicillin G Rash     Physical Exam:   Vitals  Blood pressure (!) 120/100, pulse 90, temperature 97.6 F (36.4 C), temperature source Oral, resp. rate 14, height 5\' 7"  (1.702 m), weight 86.2 kg, SpO2 100%.   1. General Frail, deconditioned, chronically ill-appearing male  2. Normal affect and insight, Not Suicidal or Homicidal, Awake Alert, Oriented X 3.  3. No F.N deficits, ALL C.Nerves Intact, Strength 5/5 all 4 extremities, Sensation intact all 4 extremities, Plantars down going.  4. Ears and Eyes appear Normal, Conjunctivae clear, PERRLA. Moist Oral Mucosa.  5. Supple Neck, No JVD, No cervical lymphadenopathy appriciated, No Carotid  Bruits.  6. Symmetrical Chest wall movement, Fuhs wheezing bilaterally, but no use of accessory muscles 7. RRR, No Gallops, Rubs or Murmurs, No Parasternal Heave.  8. Positive Bowel Sounds, Abdomen Soft, No tenderness, No organomegaly appriciated,No rebound -guarding or rigidity.  9.  No Cyanosis, Normal Skin Turgor, No Skin Rash or Bruise.  10. Good muscle tone,  joints appear normal , no effusions, Normal ROM.    Data Review:    CBC Recent Labs  Lab 08/08/23 1258  WBC 11.5*  HGB 17.3*  HCT 56.2*  PLT 198  MCV 96.9  MCH 29.8  MCHC 30.8  RDW 14.6  LYMPHSABS 1.5  MONOABS 1.2*  EOSABS 0.1  BASOSABS 0.0   ------------------------------------------------------------------------------------------------------------------  Chemistries  Recent Labs  Lab 08/08/23 1258  NA 134*  K 3.7  CL 68*  CO2 >45*  GLUCOSE 117*  BUN 76*  CREATININE 1.48*  CALCIUM  9.5  AST 19  ALT 15  ALKPHOS 86  BILITOT 1.3*   ------------------------------------------------------------------------------------------------------------------ estimated creatinine clearance is 45.9 mL/min (A) (by C-G formula based on SCr of 1.48 mg/dL (H)). ------------------------------------------------------------------------------------------------------------------ No results for input(s): "TSH", "T4TOTAL", "T3FREE", "THYROIDAB" in the last 72 hours.  Invalid input(s): "FREET3"  Coagulation profile No results for input(s): "INR", "PROTIME" in the last 168 hours. ------------------------------------------------------------------------------------------------------------------- No results for input(s): "DDIMER" in the last 72 hours. -------------------------------------------------------------------------------------------------------------------  Cardiac Enzymes No results for input(s): "CKMB", "TROPONINI", "MYOGLOBIN" in the last 168 hours.  Invalid input(s):  "CK" ------------------------------------------------------------------------------------------------------------------    Component Value Date/Time   BNP 48.0 08/08/2023 1258     ---------------------------------------------------------------------------------------------------------------  Urinalysis    Component Value Date/Time   COLORURINE YELLOW 11/22/2015  1403   APPEARANCEUR CLEAR 11/22/2015 1403   LABSPEC 1.010 11/22/2015 1403   PHURINE 5.5 11/22/2015 1403   GLUCOSEU NEGATIVE 11/22/2015 1403   HGBUR NEGATIVE 11/22/2015 1403   BILIRUBINUR NEGATIVE 11/22/2015 1403   KETONESUR NEGATIVE 11/22/2015 1403   PROTEINUR NEGATIVE 11/22/2015 1403   NITRITE NEGATIVE 11/22/2015 1403   LEUKOCYTESUR NEGATIVE 11/22/2015 1403    ----------------------------------------------------------------------------------------------------------------   Imaging Results:    DG Chest Port 1 View Result Date: 08/08/2023 CLINICAL DATA:  Dyspnea EXAM: PORTABLE CHEST 1 VIEW COMPARISON:  March 19, 2022 FINDINGS: Streaky bibasilar atelectasis. No focal airspace consolidation, pleural effusion, or pneumothorax. Redemonstrated cardiomegaly. Aortic atherosclerosis. No acute fracture or destructive lesions. Multilevel thoracic osteophytosis. IMPRESSION: Unchanged cardiomegaly with streaky bibasilar atelectasis. Electronically Signed   By: Rance Burrows M.D.   On: 08/08/2023 12:19    EKG:  Vent. rate 105 BPM PR interval * ms QRS duration 131 ms QT/QTcB 425/562 ms P-R-T axes * -80 94 Atrial flutter with predominant 3:1 AV block Right bundle branch block Inferior infarct, old Lateral leads are also involved Confirmed by Hiawatha Lout  Assessment & Plan:    Active Problems:   Cigarette smoker   Acute on chronic respiratory failure (HCC)   Acute diastolic CHF (congestive heart failure) (HCC)   COPD exacerbation (HCC)   Type 2 diabetes mellitus with stage 3a chronic kidney disease, without  long-term current use of insulin  (HCC)   Atrial arrhythmia   Acute on chronic atrial failure with hypoxia and hypercapnia - Patient with dyspnea, cough, wheezing. - Admitted under COPD protocol, continue with IV steroids, scheduled DuoNebs and as needed albuterol -was encouraged to use incentive spirometry and flutter valve. - Will start on IV Rocephin  given productive phlegm yellow in color. - He significant CO2 retention, but despite that he is alkalotic, I will keep on BiPAP intermittently. - Target oxygen  saturation between 90 and 93 no more.  CKD stage IIIb : - Renal function at baseline, avoid nephrotoxic medication  New onset A-fib - Does appear on telemetry and EKG some evidence of a flutter, but difficult to interpret, I will repeat another EKG. was noted during previous admissions to have EKG concerning for A-fib, but deemed not A-fib at the end, he is known to have MAT, frequent PVCs, so I request cardiology input to assist with interpreting the rhythm. - Anyhow I will hold on starting full anticoagulation as he does report history of multiple falls in the recent past (when I asked him how many falls, he reports more than I can count) - Monitor K and Mg, both replete. - Continue with home metoprolol    Chronic systolic, diastolic CHF  Cor pulmonale  - Continue with home dose Zaroxolyn  and torsemide     Secondary polycythemia:  -Due to chronic hypoxia.   Tobacco abuse - He was counseled, will start on nicotine  patch    DVT Prophylaxis  Lovenox   AM Labs Ordered, also please review Full Orders  Family Communication: Admission, patients condition and plan of care including tests being ordered have been discussed with the patient  who indicate understanding and agree with the plan and Code Status.  Code Status confirmed DNR, does not wish for intubation  Likely DC to home  Consults called: None  Admission status: Inpatient  Time spent in minutes : 70  minutes   Seena Dadds M.D on 08/08/2023 at 9:02 PM   [facility] - Office  [phone]

## 2023-08-08 NOTE — ED Triage Notes (Signed)
 Pt arrived by [facility] from [facility]. Pt has COPD and wears 4L at baseline. Pt has felt more increased SOB x1 week with new bilat LLE. Pt sats were in mid 80s w/ EMS but came up into 90s w/ one albuterol  tx . Pt 89 on 4L

## 2023-08-08 NOTE — Progress Notes (Signed)
 Pt transported on BiPAP from ED to ICU without complications.

## 2023-08-08 NOTE — Progress Notes (Signed)
   08/08/23 2224  TOC Brief Assessment  Insurance and Status Reviewed  Patient has primary care physician Yes  Home environment has been reviewed From home  Prior level of function: Independent  Prior/Current Home Services No current home services  Social Drivers of Health Review SDOH reviewed interventions complete (smoking cessation added to AVS)  Readmission risk has been reviewed Yes  Transition of care needs no transition of care needs at this time   Transition of Care Department [facility]) has reviewed patient and no other TOC needs have been identified at this time. We will continue to monitor patient advancement through interdisciplinary progression rounds. If new patient needs arise, please place a TOC consult.

## 2023-08-09 DIAGNOSIS — I4892 Unspecified atrial flutter: Secondary | ICD-10-CM | POA: Diagnosis not present

## 2023-08-09 DIAGNOSIS — J9621 Acute and chronic respiratory failure with hypoxia: Secondary | ICD-10-CM | POA: Diagnosis not present

## 2023-08-09 DIAGNOSIS — I498 Other specified cardiac arrhythmias: Secondary | ICD-10-CM | POA: Diagnosis not present

## 2023-08-09 DIAGNOSIS — I5031 Acute diastolic (congestive) heart failure: Secondary | ICD-10-CM | POA: Diagnosis not present

## 2023-08-09 DIAGNOSIS — I272 Pulmonary hypertension, unspecified: Secondary | ICD-10-CM | POA: Diagnosis not present

## 2023-08-09 LAB — HEMOGLOBIN A1C
Hgb A1c MFr Bld: 6 % — ABNORMAL HIGH (ref 4.8–5.6)
Mean Plasma Glucose: 125.5 mg/dL

## 2023-08-09 LAB — BASIC METABOLIC PANEL WITH GFR
BUN: 69 mg/dL — ABNORMAL HIGH (ref 8–23)
CO2: >45 mmol/L — ABNORMAL HIGH (ref 22–32)
Calcium: 9.3 mg/dL (ref 8.9–10.3)
Chloride: 71 mmol/L — ABNORMAL LOW (ref 98–111)
Creatinine, Ser: 1.48 mg/dL — ABNORMAL HIGH (ref 0.61–1.24)
GFR, Estimated: 49 mL/min — ABNORMAL LOW (ref >60)
Glucose, Bld: 246 mg/dL — ABNORMAL HIGH (ref 70–99)
Potassium: 3.9 mmol/L (ref 3.5–5.1)
Sodium: 134 mmol/L — ABNORMAL LOW (ref 135–145)

## 2023-08-09 LAB — CBC
HCT: 51.9 % (ref 39.0–52.0)
Hemoglobin: 16.4 g/dL (ref 13.0–17.0)
MCH: 29.7 pg (ref 26.0–34.0)
MCHC: 31.6 g/dL (ref 30.0–36.0)
MCV: 93.9 fL (ref 80.0–100.0)
Platelets: 187 10*3/uL (ref 150–400)
RBC: 5.53 MIL/uL (ref 4.22–5.81)
RDW: 14.5 % (ref 11.5–15.5)
WBC: 7.4 10*3/uL (ref 4.0–10.5)
nRBC: 0 % (ref 0.0–0.2)

## 2023-08-09 LAB — GLUCOSE, CAPILLARY
Glucose-Capillary: 172 mg/dL — ABNORMAL HIGH (ref 70–99)
Glucose-Capillary: 291 mg/dL — ABNORMAL HIGH (ref 70–99)
Glucose-Capillary: 191 mg/dL — ABNORMAL HIGH (ref 70–99)

## 2023-08-09 LAB — BRAIN NATRIURETIC PEPTIDE: B Natriuretic Peptide: 65 pg/mL (ref 0.0–100.0)

## 2023-08-09 MED ORDER — METHYLPREDNISOLONE SODIUM SUCC 125 MG IJ SOLR
60.0000 mg | Freq: Two times a day (BID) | INTRAMUSCULAR | Status: DC
  Administered 2023-08-09 – 2023-08-10 (×3): 60 mg via INTRAVENOUS
  Filled 2023-08-09 (×2): qty 2

## 2023-08-09 MED ORDER — INSULIN ASPART 100 UNIT/ML IJ SOLN
0.0000 [IU] | Freq: Three times a day (TID) | INTRAMUSCULAR | Status: DC
  Administered 2023-08-09 – 2023-08-10 (×2): 4 [IU] via SUBCUTANEOUS
  Administered 2023-08-10: 15 [IU] via SUBCUTANEOUS
  Administered 2023-08-11: 7 [IU] via SUBCUTANEOUS

## 2023-08-09 MED ORDER — APIXABAN 5 MG PO TABS
5.0000 mg | ORAL_TABLET | Freq: Two times a day (BID) | ORAL | Status: DC
  Administered 2023-08-09 – 2023-08-11 (×5): 5 mg via ORAL
  Filled 2023-08-09 (×5): qty 1

## 2023-08-09 MED ORDER — HYDROMORPHONE HCL 1 MG/ML IJ SOLN
0.5000 mg | Freq: Once | INTRAMUSCULAR | Status: AC
  Administered 2023-08-09: 0.5 mg via INTRAVENOUS
  Filled 2023-08-09: qty 0.5

## 2023-08-09 MED ORDER — BISOPROLOL FUMARATE 5 MG PO TABS
2.5000 mg | ORAL_TABLET | Freq: Every day | ORAL | Status: DC
  Administered 2023-08-09 – 2023-08-11 (×3): 2.5 mg via ORAL
  Filled 2023-08-09 (×3): qty 1

## 2023-08-09 MED ORDER — INSULIN ASPART 100 UNIT/ML IJ SOLN
0.0000 [IU] | Freq: Every day | INTRAMUSCULAR | Status: DC
  Administered 2023-08-09: 4 [IU] via SUBCUTANEOUS
  Administered 2023-08-10: 3 [IU] via SUBCUTANEOUS

## 2023-08-09 NOTE — Evaluation (Signed)
 Physical Therapy Evaluation Patient Details Name: Mycal Conde MRN: [id number] DOB: 21-Apr-1950 Today's Date: 08/09/2023  History of Present Illness  Durrel Mcnee  is a 73 y.o. male,  with a history of DVT tobacco use, severe 4L O2-dependent COPD, secondary polycythemia treated with phlebotomy, chronic HFpEF, HTN, T2DM, nonobstructive CAD  - Presents to ED secondary to complaints of shortness of breath, cough, congestion, patient reports symptoms ongoing for last week, reports cough, congestion, reports his girlfriend has been sick a week ago, but she was told she does not have any viral infection, giving worsening of symptoms he came to ED today.  - Had significant wheezing in ED, improved with magnesium  sulfate and IV steroids and nebulizer treatment, chest x-ray significant for cardiomegaly, atelectasis, labs were significant for pH of 7.44, PCO2 of 111, sodium of 134, chloride of 68, and creatinine at baseline of 1.48, his white blood cell count at 11.5, hemoglobin at 17.3, EKG concerning for a flutter, [facility] hospitalist consulted to admit.   Clinical Impression  Patient demonstrates modified independence with bed mobility, functional transfers, and ambulation with RE at this time. Pt does demonstrate some decreased LE strength and impaired balance. Patient also demonstrates difficulty with ambulation during today's session with decreased stride length and velocity noted but remains functional. Patient also demonstrates heavy dependence on RW for bilateral UE support. Patient requires verbal cueing for management of RW and O2 line. Plan:  Patient discharged from physical therapy to care of nursing and mobility team for ambulation daily as tolerated for length of stay.          If plan is discharge home, recommend the following: Help with stairs or ramp for entrance;Assist for transportation   Can travel by private vehicle        Equipment Recommendations BSC/3in1  Recommendations for Other  Services       Functional Status Assessment Patient has had a recent decline in their functional status and demonstrates the ability to make significant improvements in function in a reasonable and predictable amount of time.     Precautions / Restrictions Precautions Precautions: Fall Recall of Precautions/Restrictions: Intact Restrictions Weight Bearing Restrictions Per Provider Order: No      Mobility  Bed Mobility Overal bed mobility: Modified Independent             General bed mobility comments: use of UE support on handrails used    Transfers Overall transfer level: Modified independent Equipment used: Rolling walker (2 wheels)               General transfer comment: increased time, verbal cueing for hand placement    Ambulation/Gait Ambulation/Gait assistance: Modified independent (Device/Increase time) Gait Distance (Feet): 40 Feet Assistive device: Rolling walker (2 wheels) Gait Pattern/deviations: WFL(Within Functional Limits), Decreased step length - right, Decreased step length - left, Shuffle, Trunk flexed Gait velocity: decreased     General Gait Details: wide BOS, heavily dependent on UE support from [employer] Mobility     Tilt Bed    Modified Rankin (Stroke Patients Only)       Balance Overall balance assessment: Modified Independent                                           Pertinent Vitals/Pain Pain Assessment Pain Assessment: No/denies pain  Home Living Family/patient expects to be discharged to:: Private residence Living Arrangements: Spouse/significant other;Children Available Help at Discharge: Family Type of Home: Mobile home Home Access: Stairs to enter   Entrance Stairs-Number of Steps: 7, has ramp with railing   Home Layout: One level Home Equipment: Agricultural consultant (2 wheels);Cane - single point;Wheelchair - manual;Grab bars - tub/shower;Grab bars - toilet       Prior Function Prior Level of Function : Independent/Modified Independent             Mobility Comments: community ambulator, did not drive ADLs Comments: able to complete all basic ADLs with independence     Extremity/Trunk Assessment   Upper Extremity Assessment Upper Extremity Assessment: Overall WFL for tasks assessed    Lower Extremity Assessment Lower Extremity Assessment: Overall WFL for tasks assessed;Generalized weakness    Cervical / Trunk Assessment Cervical / Trunk Assessment: Kyphotic  Communication   Communication Communication: No apparent difficulties    Cognition Arousal: Alert Behavior During Therapy: WFL for tasks assessed/performed   PT - Cognitive impairments: No apparent impairments                         Following commands: Intact       Cueing Cueing Techniques: Verbal cues, Visual cues     General Comments      Exercises     Assessment/Plan    PT Assessment Patient does not need any further PT services  PT Problem List         PT Treatment Interventions      PT Goals (Current goals can be found in the Care Plan section)  Acute Rehab PT Goals Patient Stated Goal: to return home PT Goal Formulation: With patient Time For Goal Achievement: 08/09/23 Potential to Achieve Goals: Good    Frequency       Co-evaluation               AM-PAC PT "6 Clicks" Mobility  Outcome Measure Help needed turning from your back to your side while in a flat bed without using bedrails?: A Little Help needed moving from lying on your back to sitting on the side of a flat bed without using bedrails?: A Little Help needed moving to and from a bed to a chair (including a wheelchair)?: None Help needed standing up from a chair using your arms (e.g., wheelchair or bedside chair)?: None Help needed to walk in hospital room?: None Help needed climbing 3-5 steps with a railing? : A Little 6 Click Score: 21    End of Session    Activity Tolerance: Patient tolerated treatment well;No increased pain Patient left: in bed;with call bell/phone within reach;with bed alarm set Nurse Communication: Mobility status;Precautions PT Visit Diagnosis: Other abnormalities of gait and mobility (R26.89);History of falling (Z91.81)    Time: 1610-9604 PT Time Calculation (min) (ACUTE ONLY): 28 min   Charges:   PT Evaluation $PT Eval Moderate Complexity: 1 Mod PT Treatments $Therapeutic Activity: 8-22 mins PT General Charges $$ ACUTE PT VISIT: 1 Visit         Armond Bertin, PT, DPT [facility] Office: [phone] 5:48 PM, 08/09/23

## 2023-08-09 NOTE — Progress Notes (Signed)
 Patient does not want to try BIPAP tonight, he just wants to wear the nasal cannula. No unit in room at this time.

## 2023-08-09 NOTE — Hospital Course (Signed)
 73 y.o. male,  with a history of DVT tobacco use, severe 4L O2-dependent COPD, secondary polycythemia treated with phlebotomy, chronic HFpEF, HTN, T2DM, nonobstructive CAD - Presents to ED secondary to complaints of shortness of breath, cough, congestion, patient reports symptoms ongoing for last week, reports cough, congestion, reports his girlfriend has been sick a week ago, but she was told she does not have any viral infection, giving worsening of symptoms he came to ED today. - Had significant wheezing in ED, improved with magnesium  sulfate and IV steroids and nebulizer treatment, chest x-ray significant for cardiomegaly, atelectasis, labs were significant for pH of 7.44, PCO2 of 111, sodium of 134, chloride of 68, and creatinine at baseline of 1.48, his white blood cell count at 11.5, hemoglobin at 17.3, EKG concerning for a flutter, [facility] hospitalist consulted to admit.

## 2023-08-09 NOTE — Consult Note (Signed)
 Parsons Consultation   Patient ID: Brian Parsons MRN: [id number]; DOB: 09-03-1950  Admit date: 08/08/2023 Date of Consult: 08/09/2023  PCP:  Brian Negus, NP   [facility] Providers Cardiologist:  Brian Parsons, wants to establish with Brian Parsons.    Patient Profile:   Brian Parsons is a 73 y.o. male with a hx of chronic HFpEF, nonobstructive CAD, HTN, DM, COPD on home O2, pulmonary HTN group 3, chronic RV failure/cor pulmonale who is being seen 08/09/2023 for the evaluation of new onset atrial flutter at the request of Brian Parsons  History of Present Illness:   [name] 73 yo male history of chronic HFimpEF, nonobstructive CAD, HTN, DM, COPD on home O2, pulmonary HTN group 3, chronic RV failure/cor pulmonale, MAT, admitted with SOB, cough, congestion. Recent sick contact. He is followed by [facility] Parsons. Upon ER evaluation found to be in new aflutter. Patient denies any specific palpitatoins.    K 3.7 BUN 76 Cr 1.48 GFR 49 WBC 11.5 Hgb 17.3 Plt 198 BNP 48  Trop 27-->23 VBG 7.44/111/76 CXR no acute process EKG afluitter   03/2022 echo: poor visualization, LV likely moderately to severely decreased, severe RV dysfnction. F/u review of imaging during that admission suggested LVEF 50-55%  03/2022 RHC/LHC: mild nonobstructive CAD Mean PA 35, PCWP 14, CI 2.      Past Medical History:  Diagnosis Date   Bilateral leg edema    CHF (congestive heart failure) (HCC)    COPD (chronic obstructive pulmonary disease) (HCC)    Coronary atherosclerosis    Nonobstructive at cardiac catheterization April 2017   Diabetes mellitus without complication (HCC)    Essential hypertension    On home O2    2L N/C prn   Polycythemia     Past Surgical History:  Procedure Laterality Date   CARDIAC CATHETERIZATION N/A 08/10/2015   Procedure: Left Heart Cath and Coronary Angiography;  Surgeon: Brian Ally, MD;  Location: [facility] INVASIVE CV LAB;  Service: Cardiovascular;   Laterality: N/A;   RIGHT/LEFT HEART CATH AND CORONARY ANGIOGRAPHY N/A 03/24/2022   Procedure: RIGHT/LEFT HEART CATH AND CORONARY ANGIOGRAPHY;  Surgeon: Brian Shade, MD;  Location: [facility] INVASIVE CV LAB;  Service: Cardiovascular;  Laterality: N/A;      Inpatient Medications: Scheduled Meds:  allopurinol   100 mg Oral BID   aspirin  EC  81 mg Oral Daily   atorvastatin   20 mg Oral q1800   azelastine   1 spray Each Nare BID   busPIRone   7.5 mg Oral BID   Chlorhexidine  Gluconate Cloth  6 each Topical Daily   cyanocobalamin   1,000 mcg Oral Daily   doxepin   10 mg Oral QHS   enoxaparin  (LOVENOX ) injection  40 mg Subcutaneous Q24H   ipratropium-albuterol   3 mL Nebulization Q4H   methylPREDNISolone  sodium succinate  125 mg Intravenous Q24H   metolazone   2.5 mg Oral QODAY   metoprolol  succinate  12.5 mg Oral Daily   pantoprazole   40 mg Oral Daily   torsemide   80 mg Oral BID   Continuous Infusions:  cefTRIAXone  (ROCEPHIN )  IV Stopped (08/08/23 1942)   PRN Meds: acetaminophen  **OR** acetaminophen , albuterol , diltiazem , hydrALAZINE , LORazepam   Allergies:    Allergies  Allergen Reactions   Citalopram Other (See Comments)    GI bleed   Bupropion Other (See Comments)    Unknown   Escitalopram  Other (See Comments)    Unknown   Penicillins Hives   Trazodone Hcl Other (See Comments)    Unknown  Varenicline Tartrate Other (See Comments)    Bad dreams   Amoxicillin Hives and Rash   Penicillin G Rash    Social History:   Social History   Socioeconomic History   Marital status: Divorced    Spouse name: Not on file   Number of children: Not on file   Years of education: Not on file   Highest education level: Not on file  Occupational History   Not on file  Tobacco Use   Smoking status: Every Day    Current packs/day: 0.25    Average packs/day: 0.3 packs/day for 62.3 years (15.6 ttl pk-yrs)    Types: Cigarettes    Start date: 04/17/1961   Smokeless tobacco: Never  Vaping Use    Vaping status: Never Used  Substance and Sexual Activity   Alcohol use: No    Alcohol/week: 0.0 standard drinks of alcohol    Comment: No EtOH for 7 years   Drug use: No   Sexual activity: Yes  Other Topics Concern   Not on file  Social History Narrative   Not on file   Social Drivers of Health   Financial Resource Strain: Not on file  Food Insecurity: No Food Insecurity (08/08/2023)   Hunger Vital Sign    Worried About Running Out of Food in the Last Year: Never true    Ran Out of Food in the Last Year: Never true  Transportation Needs: No Transportation Needs (08/08/2023)   PRAPARE - Administrator, Civil Service (Medical): No    Lack of Transportation (Non-Medical): No  Physical Activity: Not on file  Stress: Not on file  Social Connections: Unknown (08/08/2023)   Social Connection and Isolation Panel [NHANES]    Frequency of Communication with Friends and Family: Patient unable to answer    Frequency of Social Gatherings with Friends and Family: Patient unable to answer    Attends Religious Services: Never    Database administrator or Organizations: No    Attends Banker Meetings: Patient unable to answer    Marital Status: Living with partner  Intimate Partner Violence: Not At Risk (08/08/2023)   Humiliation, Afraid, Rape, and Kick questionnaire    Fear of Current or Ex-Partner: No    Emotionally Abused: No    Physically Abused: No    Sexually Abused: No    Family History:    Family History  Problem Relation Age of Onset   Stroke Mother    Emphysema Father      ROS:  Please see the history of present illness.   All other ROS reviewed and negative.     Physical Exam/Data:   Vitals:   08/09/23 0600 08/09/23 0754 08/09/23 0800 08/09/23 0806  BP: 110/66 130/86 129/68   Pulse: 88 97 99   Resp: 16 (!) 21 19   Temp:  97.8 F (36.6 C)  97.8 F (36.6 C)  TempSrc:  Oral  Oral  SpO2: 92% 96% 95%   Weight:      Height:         Intake/Output Summary (Last 24 hours) at 08/09/2023 0808 Last data filed at 08/09/2023 0643 Gross per 24 hour  Intake 148.34 ml  Output 650 ml  Net -501.66 ml      08/08/2023    8:32 PM 08/08/2023   11:44 AM 03/23/2022    3:23 AM  Last 3 Weights  Weight (lbs) 190 lb 0.6 oz 185 lb 192 lb 14.4 oz  Weight (  kg) 86.2 kg 83.915 kg 87.5 kg     Body mass index is 29.76 kg/m.  General:  Well nourished, well developed, in no acute distress HEENT: normal Neck: no JVD Vascular: No carotid bruits; Distal pulses 2+ bilaterally Cardiac:  normal S1, S2; RRR; no murmur  Lungs:  bilateral wheezing Abd: soft, nontender, no hepatomegaly  Ext: no edema Musculoskeletal:  No deformities, BUE and BLE strength normal and equal Skin: warm and dry  Neuro:  CNs 2-12 intact, no focal abnormalities noted Psych:  Normal affect     Laboratory Data:  High Sensitivity Troponin:   Recent Labs  Lab 08/08/23 1258 08/08/23 1414  TROPONINIHS 27* 23*     Chemistry Recent Labs  Lab 08/08/23 1258 08/09/23 0456  NA 134* 134*  K 3.7 3.9  CL 68* 71*  CO2 >45* >45*  GLUCOSE 117* 246*  BUN 76* 69*  CREATININE 1.48* 1.48*  CALCIUM  9.5 9.3  GFRNONAA 49* 49*  ANIONGAP NOT CALCULATED NOT CALCULATED    Recent Labs  Lab 08/08/23 1258  PROT 7.1  ALBUMIN 3.4*  AST 19  ALT 15  ALKPHOS 86  BILITOT 1.3*   Lipids No results for input(s): "CHOL", "TRIG", "HDL", "LABVLDL", "LDLCALC", "CHOLHDL" in the last 168 hours.  Hematology Recent Labs  Lab 08/08/23 1258 08/09/23 0456  WBC 11.5* 7.4  RBC 5.80 5.53  HGB 17.3* 16.4  HCT 56.2* 51.9  MCV 96.9 93.9  MCH 29.8 29.7  MCHC 30.8 31.6  RDW 14.6 14.5  PLT 198 187   Thyroid No results for input(s): "TSH", "FREET4" in the last 168 hours.  BNP Recent Labs  Lab 08/08/23 1258 08/09/23 0456  BNP 48.0 65.0    DDimer No results for input(s): "DDIMER" in the last 168 hours.   Radiology/Studies:  DG Chest Port 1 View Result Date:  08/08/2023 CLINICAL DATA:  Dyspnea EXAM: PORTABLE CHEST 1 VIEW COMPARISON:  March 19, 2022 FINDINGS: Streaky bibasilar atelectasis. No focal airspace consolidation, pleural effusion, or pneumothorax. Redemonstrated cardiomegaly. Aortic atherosclerosis. No acute fracture or destructive lesions. Multilevel thoracic osteophytosis. IMPRESSION: Unchanged cardiomegaly with streaky bibasilar atelectasis. Electronically Signed   By: Rance Burrows M.D.   On: 08/08/2023 12:19     Assessment and Plan:   1.Aflutter - prior history of MAT, new diagnosis of aflutter this admission - he is on toprol  12.5mg  daily. Rates are reasonable given degree of systemic illness with COPD exacerbation, high dose steroids.  - continue toprol  at current dosing, room to titrate if needed -CHADS2Vasc score is 5 (CHF, HTN, age x 1, DM, CAD). In a few weeks he will be 73 and score will go up to 6.  - reports some prior falls, roughly 3-4 over the last 6 months with last one about 1 month ago - at this time I think risk of stroke is quite high given his CHADS2Vasc score and would start eliquis  5mg  bid.  - given respiratory status poor candidate to consider DCCV, would plan for rate control at this time.  - change metoprolol  to bisoprolol   given severe COPD   2. Chronic RV failure/Pulmonary HTN group 3 - prior RHC as listed above, precapillary pulmonary HTN consistent with his severe O2 dependent COPD - he is on torsemide  80mg  bid, metolazine 2.5mg  daily.  - appears euvolemic, CXR without edema and normal BNP.  - can continue home diuretic regiment with torsemide  80mg  bid, metolazone  2.5mg  every other day  3.HFimpEF - LVEF has been difficult to assess from prior echos  given limited visualization - last LVEF 50-55% after further reviewed during 03/2022 admission. According to [facility] notes had previously been as low as 40-45%.  - repeat echo - appears euvolemic, continue current diuretic regimen -change toprol  to bisoprolol   given severe COPD  4. COPD exacerbation - per primary team, he is on home O2 chronically 4L - fairly significant chronic respiratory acidosis with pCO2 111 and metabolic alkalosis to compensate with bicab at 76, pH 7.4    For questions or updates, please contact [facility] Please consult [email or web address] for contact info under    Signed, Armida Lander, MD  08/09/2023 8:08 AM

## 2023-08-09 NOTE — Progress Notes (Signed)
 PROGRESS NOTE   Brian Parsons  ZOX:[id number] DOB: 05-06-1950 DOA: 08/08/2023 PCP: Jonah Negus, NP   Chief Complaint  Patient presents with   Shortness of Breath   Level of care: Telemetry  Brief Admission History:  73 y.o. male,  with a history of DVT tobacco use, severe 4L O2-dependent COPD, secondary polycythemia treated with phlebotomy, chronic HFpEF, HTN, T2DM, nonobstructive CAD - Presents to ED secondary to complaints of shortness of breath, cough, congestion, patient reports symptoms ongoing for last week, reports cough, congestion, reports his girlfriend has been sick a week ago, but she was told she does not have any viral infection, giving worsening of symptoms he came to ED today. - Had significant wheezing in ED, improved with magnesium  sulfate and IV steroids and nebulizer treatment, chest x-ray significant for cardiomegaly, atelectasis, labs were significant for pH of 7.44, PCO2 of 111, sodium of 134, chloride of 68, and creatinine at baseline of 1.48, his white blood cell count at 11.5, hemoglobin at 17.3, EKG concerning for a flutter, [facility] hospitalist consulted to admit.   Assessment and Plan:  Acute on chronic atrial failure with hypoxia and hypercapnia Acute COPD exacerbation  - admitted with dyspnea, cough, wheezing. - Admitted under COPD protocol, continue with IV steroids, scheduled DuoNebs and as needed albuterol -was encouraged to use incentive spirometry and flutter valve. - continue IV Rocephin   - presented with significant CO2 retention but compensated and chronic  - Target oxygen  saturation between 90 and 93 no more.   CKD stage IIIb : - Renal function at baseline, avoid nephrotoxic medication   New onset Atrial Flutter  - EKG more consistent with atrial flutter - he has been seen by inpatient cardiology and started on bisoprolol  2.5 mg daily given severe resp disease  - agree with cardiology with starting him on apixaban  5 mg BID given high CHADVASC  score   - Monitor K and Mg, both replete. - Continue with home metoprolol    Chronic systolic, diastolic CHF  Cor pulmonale  - Continue with home dose Zaroxolyn  and torsemide     Secondary polycythemia:  -Due to chronic hypoxia.    Tobacco abuse - He was counseled, now on nicotine  patch   DNR present on admission  - pt requested to continue DNR order while in hospital    DVT prophylaxis: apixaban   Code Status: DNR  Family Communication: no present  Disposition: Status is: Inpatient   Consultants:  Cardiology   Procedures:    Antimicrobials:  Ceftriaxone  IV 4/23 >>   Subjective: Pt reports he is already wanting to go home, but willing to meet with cardiology, breathing better, denies CP and denies palpitations.    Objective: Vitals:   08/09/23 0600 08/09/23 0754 08/09/23 0800 08/09/23 0806  BP: 110/66 130/86 129/68   Pulse: 88 97 99   Resp: 16 (!) 21 19   Temp:  97.8 F (36.6 C)  97.8 F (36.6 C)  TempSrc:  Oral  Oral  SpO2: 92% 96% 95%   Weight:      Height:        Intake/Output Summary (Last 24 hours) at 08/09/2023 0930 Last data filed at 08/09/2023 8119 Gross per 24 hour  Intake 148.34 ml  Output 650 ml  Net -501.66 ml   Filed Weights   08/08/23 1144 08/08/23 2032  Weight: 83.9 kg 86.2 kg   Examination:  General exam: Appears calm and comfortable  Respiratory system: Clear to auscultation. Respiratory effort normal. Cardiovascular system: irregularly irregular, normal S1 &  S2 heard. No JVD, murmurs, rubs, gallops or clicks. No pedal edema. Gastrointestinal system: Abdomen is nondistended, soft and nontender. No organomegaly or masses felt. Normal bowel sounds heard. Central nervous system: Alert and oriented. No focal neurological deficits. Extremities: Symmetric 5 x 5 power. Skin: No rashes, lesions or ulcers. Psychiatry: Judgement and insight appear normal. Mood & affect appropriate.   Data Reviewed: I have personally reviewed following labs and  imaging studies  CBC: Recent Labs  Lab 08/08/23 1258 08/09/23 0456  WBC 11.5* 7.4  NEUTROABS 8.6*  --   HGB 17.3* 16.4  HCT 56.2* 51.9  MCV 96.9 93.9  PLT 198 187    Basic Metabolic Panel: Recent Labs  Lab 08/08/23 1258 08/09/23 0456  NA 134* 134*  K 3.7 3.9  CL 68* 71*  CO2 >45* >45*  GLUCOSE 117* 246*  BUN 76* 69*  CREATININE 1.48* 1.48*  CALCIUM  9.5 9.3    CBG: No results for input(s): "GLUCAP" in the last 168 hours.  Recent Results (from the past 240 hours)  SARS Coronavirus 2 by RT PCR (hospital order, performed in [facility] hospital lab) *cepheid single result test* Anterior Nasal Swab     Status: None   Collection Time: 08/08/23  5:17 PM   Specimen: Anterior Nasal Swab  Result Value Ref Range Status   SARS Coronavirus 2 by RT PCR NEGATIVE NEGATIVE Final    Comment: (NOTE) SARS-CoV-2 target nucleic acids are NOT DETECTED.  The SARS-CoV-2 RNA is generally detectable in upper and lower respiratory specimens during the acute phase of infection. The lowest concentration of SARS-CoV-2 viral copies this assay can detect is 250 copies / mL. A negative result does not preclude SARS-CoV-2 infection and should not be used as the sole basis for treatment or other patient management decisions.  A negative result may occur with improper specimen collection / handling, submission of specimen other than nasopharyngeal swab, presence of viral mutation(s) within the areas targeted by this assay, and inadequate number of viral copies (<250 copies / mL). A negative result must be combined with clinical observations, patient history, and epidemiological information.  Fact Sheet for Patients:   RoadLapTop.co.za  Fact Sheet for Healthcare Providers: http://kim-miller.com/  This test is not yet approved or  cleared by the [place]  FDA and has been authorized for detection and/or diagnosis of SARS-CoV-2 by FDA under an  Emergency Use Authorization (EUA).  This EUA will remain in effect (meaning this test can be used) for the duration of the COVID-19 declaration under Section 564(b)(1) of the Act, 21 U.S.C. section 360bbb-3(b)(1), unless the authorization is terminated or revoked sooner.  Performed at [facility], [address], [place], [place] [address]   MRSA Next Gen by PCR, Nasal     Status: None   Collection Time: 08/08/23  8:18 PM   Specimen: Nasal Mucosa; Nasal Swab  Result Value Ref Range Status   MRSA by PCR Next Gen NOT DETECTED NOT DETECTED Final    Comment: (NOTE) The GeneXpert MRSA Assay (FDA approved for NASAL specimens only), is one component of a comprehensive MRSA colonization surveillance program. It is not intended to diagnose MRSA infection nor to guide or monitor treatment for MRSA infections. Test performance is not FDA approved in patients less than 70 years old. Performed at [facility], [address] [place], [place] [address]      Radiology Studies: [facility] Chest [facility] 1 View Result Date: 08/08/2023 CLINICAL DATA:  Dyspnea EXAM: PORTABLE CHEST 1 VIEW COMPARISON:  March 19, 2022 FINDINGS: Streaky bibasilar atelectasis. No focal airspace consolidation, pleural effusion, or pneumothorax. Redemonstrated cardiomegaly. Aortic atherosclerosis. No acute fracture or destructive lesions. Multilevel thoracic osteophytosis. IMPRESSION: Unchanged cardiomegaly with streaky bibasilar atelectasis. Electronically Signed   By: Rance Burrows M.D.   On: 08/08/2023 12:19    Scheduled Meds:  allopurinol   100 mg Oral BID   apixaban   5 mg Oral BID   atorvastatin   20 mg Oral q1800   azelastine   1 spray Each Nare BID   bisoprolol   2.5 mg Oral Daily   busPIRone   7.5 mg Oral BID   Chlorhexidine  Gluconate Cloth  6 each Topical Daily   cyanocobalamin   1,000 mcg Oral Daily   doxepin   10 mg Oral QHS   insulin  aspart  0-20 Units Subcutaneous TID WC   insulin  aspart  0-5 Units Subcutaneous QHS    ipratropium-albuterol   3 mL Nebulization Q4H   methylPREDNISolone  sodium succinate  60 mg Intravenous Q12H   metolazone   2.5 mg Oral QODAY   pantoprazole   40 mg Oral Daily   torsemide   80 mg Oral BID   Continuous Infusions:  cefTRIAXone  (ROCEPHIN )  IV Stopped (08/08/23 1942)     LOS: 1 day   Time spent: 58 mins  Maleni Seyer Lincoln Renshaw, MD How to contact the [facility] Attending or Consulting provider 7A - 7P or covering provider during after hours 7P -7A, for this patient?  Check the care team in [facility] and look for a) attending/consulting TR[facility] provider listed and b) the TRH team listed Log into [email or web address] to find provider on call.  Locate the TR[facility] provider you are looking for under [facility] and page to a number that you can be directly reached. If you still have difficulty reaching the provider, please page the [facility] (Director on Call) for the Hospitalists listed on amion for assistance.  08/09/2023, 9:30 AM

## 2023-08-09 NOTE — Progress Notes (Signed)
 Patient BIPAP has been having high leakages despite snug fitting the mask, patient is alert and calm and is compliant with the BIPAP, RT notified, and changed to nasal cannula at 4 litres, satting well, will continue to monitor.  08/09/23 0245  Oxygen  Therapy  SpO2 92 %  O2 Device (S)  Nasal Cannula  O2 Flow Rate (L/min) 4 L/min  Patient Activity (if Appropriate) In bed  Pulse Oximetry Type Continuous

## 2023-08-09 NOTE — Progress Notes (Signed)
 RN called to state that patient's BIPAP machine continues to beep and he cannot tolerate the mask being tightened anymore. Patient has significant facial hair causing leak. Patient was placed back on nasal cannula at this time.

## 2023-08-10 ENCOUNTER — Other Ambulatory Visit (HOSPITAL_COMMUNITY): Payer: Self-pay

## 2023-08-10 ENCOUNTER — Encounter (HOSPITAL_COMMUNITY): Payer: Self-pay | Admitting: Hematology

## 2023-08-10 DIAGNOSIS — N1831 Chronic kidney disease, stage 3a: Secondary | ICD-10-CM | POA: Diagnosis not present

## 2023-08-10 DIAGNOSIS — I4892 Unspecified atrial flutter: Secondary | ICD-10-CM | POA: Diagnosis not present

## 2023-08-10 DIAGNOSIS — E1122 Type 2 diabetes mellitus with diabetic chronic kidney disease: Secondary | ICD-10-CM

## 2023-08-10 DIAGNOSIS — J441 Chronic obstructive pulmonary disease with (acute) exacerbation: Secondary | ICD-10-CM | POA: Diagnosis not present

## 2023-08-10 LAB — GLUCOSE, CAPILLARY
Glucose-Capillary: 339 mg/dL — ABNORMAL HIGH (ref 70–99)
Glucose-Capillary: 106 mg/dL — ABNORMAL HIGH (ref 70–99)
Glucose-Capillary: 200 mg/dL — ABNORMAL HIGH (ref 70–99)
Glucose-Capillary: 306 mg/dL — ABNORMAL HIGH (ref 70–99)
Glucose-Capillary: 251 mg/dL — ABNORMAL HIGH (ref 70–99)

## 2023-08-10 LAB — BASIC METABOLIC PANEL WITH GFR
BUN: 75 mg/dL — ABNORMAL HIGH (ref 8–23)
CO2: >45 mmol/L — ABNORMAL HIGH (ref 22–32)
Calcium: 9 mg/dL (ref 8.9–10.3)
Chloride: 74 mmol/L — ABNORMAL LOW (ref 98–111)
Creatinine, Ser: 1.48 mg/dL — ABNORMAL HIGH (ref 0.61–1.24)
GFR, Estimated: 49 mL/min — ABNORMAL LOW (ref >60)
Glucose, Bld: 202 mg/dL — ABNORMAL HIGH (ref 70–99)
Potassium: 3.5 mmol/L (ref 3.5–5.1)
Sodium: 135 mmol/L (ref 135–145)

## 2023-08-10 LAB — MAGNESIUM: Magnesium: 2.6 mg/dL — ABNORMAL HIGH (ref 1.7–2.4)

## 2023-08-10 MED ORDER — INSULIN ASPART 100 UNIT/ML IJ SOLN
5.0000 [IU] | Freq: Once | INTRAMUSCULAR | Status: AC
  Administered 2023-08-10: 5 [IU] via SUBCUTANEOUS

## 2023-08-10 MED ORDER — IPRATROPIUM-ALBUTEROL 0.5-2.5 (3) MG/3ML IN SOLN
3.0000 mL | Freq: Four times a day (QID) | RESPIRATORY_TRACT | Status: DC
  Administered 2023-08-11: 3 mL via RESPIRATORY_TRACT
  Filled 2023-08-10: qty 3

## 2023-08-10 MED ORDER — COLCHICINE 0.6 MG PO TABS: 0.6000 mg | ORAL_TABLET | ORAL | Status: DC | PRN

## 2023-08-10 MED ORDER — BUDESON-GLYCOPYRROL-FORMOTEROL 160-9-4.8 MCG/ACT IN AERO
2.0000 | INHALATION_SPRAY | Freq: Two times a day (BID) | RESPIRATORY_TRACT | Status: DC
  Administered 2023-08-10 – 2023-08-11 (×2): 2 via RESPIRATORY_TRACT
  Filled 2023-08-10: qty 5.9

## 2023-08-10 MED ORDER — INSULIN ASPART 100 UNIT/ML IJ SOLN
5.0000 [IU] | Freq: Three times a day (TID) | INTRAMUSCULAR | Status: DC
  Administered 2023-08-10: 5 [IU] via SUBCUTANEOUS

## 2023-08-10 MED ORDER — METHYLPREDNISOLONE SODIUM SUCC 125 MG IJ SOLR
40.0000 mg | Freq: Two times a day (BID) | INTRAMUSCULAR | Status: DC
  Administered 2023-08-10 – 2023-08-11 (×2): 40 mg via INTRAVENOUS
  Filled 2023-08-10 (×2): qty 2

## 2023-08-10 MED ORDER — MELATONIN 3 MG PO TABS
3.0000 mg | ORAL_TABLET | Freq: Every evening | ORAL | Status: DC | PRN
  Administered 2023-08-10: 3 mg via ORAL
  Filled 2023-08-10: qty 1

## 2023-08-10 MED ORDER — METOPROLOL TARTRATE 5 MG/5ML IV SOLN
5.0000 mg | Freq: Once | INTRAVENOUS | Status: AC | PRN
  Administered 2023-08-10: 5 mg via INTRAVENOUS
  Filled 2023-08-10: qty 5

## 2023-08-10 MED ORDER — FLUTICASONE PROPIONATE 50 MCG/ACT NA SUSP
1.0000 | Freq: Every day | NASAL | Status: DC
  Administered 2023-08-10 – 2023-08-11 (×2): 1 via NASAL
  Filled 2023-08-10: qty 16

## 2023-08-10 MED ORDER — RAMELTEON 8 MG PO TABS: 8.0000 mg | ORAL_TABLET | Freq: Every evening | ORAL | Status: DC | PRN

## 2023-08-10 MED ORDER — INSULIN GLARGINE-YFGN 100 UNIT/ML ~~LOC~~ SOLN
12.0000 [IU] | Freq: Every day | SUBCUTANEOUS | Status: DC
  Administered 2023-08-10: 12 [IU] via SUBCUTANEOUS
  Filled 2023-08-10 (×2): qty 0.12

## 2023-08-10 NOTE — Progress Notes (Signed)
 Rounding Note    Patient Name: Brian Parsons Date of Encounter: 08/10/2023  [facility] Cardiologist: [facility], wants to establish with [facility].   Subjective   Some ongoing SOB but improving  Inpatient Medications    Scheduled Meds:  allopurinol   100 mg Oral BID   apixaban   5 mg Oral BID   atorvastatin   20 mg Oral q1800   azelastine   1 spray Each Nare BID   bisoprolol   2.5 mg Oral Daily   busPIRone   7.5 mg Oral BID   Chlorhexidine  Gluconate Cloth  6 each Topical Daily   cyanocobalamin   1,000 mcg Oral Daily   doxepin   10 mg Oral QHS   insulin  aspart  0-20 Units Subcutaneous TID WC   insulin  aspart  0-5 Units Subcutaneous QHS   ipratropium-albuterol   3 mL Nebulization Q4H   methylPREDNISolone  sodium succinate  60 mg Intravenous Q12H   metolazone   2.5 mg Oral QODAY   pantoprazole   40 mg Oral Daily   torsemide   80 mg Oral BID   Continuous Infusions:  cefTRIAXone  (ROCEPHIN )  IV Stopped (08/08/23 1942)   PRN Meds: acetaminophen  **OR** acetaminophen , albuterol , diltiazem , hydrALAZINE , LORazepam , melatonin   Vital Signs    Vitals:   08/09/23 2043 08/09/23 2353 08/10/23 0305 08/10/23 0310  BP:    126/81  Pulse:    99  Resp:    18  Temp:    98.1 F (36.7 C)  TempSrc:    Oral  SpO2: 95% 94% 97% 100%  Weight:      Height:        Intake/Output Summary (Last 24 hours) at 08/10/2023 0820 Last data filed at 08/10/2023 0100 Gross per 24 hour  Intake 880 ml  Output 175 ml  Net 705 ml      08/08/2023    8:32 PM 08/08/2023   11:44 AM 03/23/2022    3:23 AM  Last 3 Weights  Weight (lbs) 190 lb 0.6 oz 185 lb 192 lb 14.4 oz  Weight (kg) 86.2 kg 83.915 kg 87.5 kg      Telemetry    Rate controlled aflutter - Personally Reviewed  ECG    N/a - Personally Reviewed  Physical Exam   GEN: No acute distress.   Neck: No JVD Cardiac: RRR, no murmurs, rubs, or gallops.  Respiratory: bilateral wheezing GI: Soft, nontender, non-distended  MS: No edema; No  deformity. Neuro:  Nonfocal  Psych: Normal affect   Labs    High Sensitivity Troponin:   Recent Labs  Lab 08/08/23 1258 08/08/23 1414  TROPONINIHS 27* 23*     Chemistry Recent Labs  Lab 08/08/23 1258 08/09/23 0456 08/10/23 0444  NA 134* 134* 135  K 3.7 3.9 3.5  CL 68* 71* 74*  CO2 >45* >45* >45*  GLUCOSE 117* 246* 202*  BUN 76* 69* 75*  CREATININE 1.48* 1.48* 1.48*  CALCIUM  9.5 9.3 9.0  MG  --   --  2.6*  PROT 7.1  --   --   ALBUMIN 3.4*  --   --   AST 19  --   --   ALT 15  --   --   ALKPHOS 86  --   --   BILITOT 1.3*  --   --   GFRNONAA 49* 49* 49*  ANIONGAP NOT CALCULATED NOT CALCULATED NOT CALCULATED    Lipids No results for input(s): "CHOL", "TRIG", "HDL", "LABVLDL", "LDLCALC", "CHOLHDL" in the last 168 hours.  Hematology Recent Labs  Lab 08/08/23  1258 08/09/23 0456  WBC 11.5* 7.4  RBC 5.80 5.53  HGB 17.3* 16.4  HCT 56.2* 51.9  MCV 96.9 93.9  MCH 29.8 29.7  MCHC 30.8 31.6  RDW 14.6 14.5  PLT 198 187   Thyroid No results for input(s): "TSH", "FREET4" in the last 168 hours.  BNP Recent Labs  Lab 08/08/23 1258 08/09/23 0456  BNP 48.0 65.0    DDimer No results for input(s): "DDIMER" in the last 168 hours.   Radiology    DG Chest Port 1 View Result Date: 08/08/2023 CLINICAL DATA:  Dyspnea EXAM: PORTABLE CHEST 1 VIEW COMPARISON:  March 19, 2022 FINDINGS: Streaky bibasilar atelectasis. No focal airspace consolidation, pleural effusion, or pneumothorax. Redemonstrated cardiomegaly. Aortic atherosclerosis. No acute fracture or destructive lesions. Multilevel thoracic osteophytosis. IMPRESSION: Unchanged cardiomegaly with streaky bibasilar atelectasis. Electronically Signed   By: Rance Burrows M.D.   On: 08/08/2023 12:19    Cardiac Studies    Patient Profile     Brian Parsons 73 yo male history of chronic HFimpEF, nonobstructive CAD, HTN, DM, COPD on home O2, pulmonary HTN group 3, chronic RV failure/cor pulmonale, MAT, admitted with SOB, cough,  congestion. Recent sick contact. He is followed by [facility] cardiology. Upon ER evaluation found to be in new aflutter. Patient denies any specific palpitatoins.   Assessment & Plan    1.Aflutter - prior history of MAT, new diagnosis of aflutter this admission in setting of COPD exacerbation, high dose steroids.  - metoprolol  changed to bisoprolol  given severe COPD, Rates are well controlled  -CHADS2Vasc score is 5 (CHF, HTN, age x 1, DM, CAD). In a few weeks he will be 73 and score will go up to 6.  - reports some prior falls, roughly 3-4 over the last 6 months with last one about 1 month ago - at this time I think risk of stroke is quite high given his CHADS2Vasc score and started  eliquis  5mg  bid.  - given respiratory status poor candidate to consider DCCV, would plan for rate control at this time. Can reassess at outpatient f/u    2. Chronic RV failure/Pulmonary HTN group 3 - prior RHC as listed above, precapillary pulmonary HTN consistent with his severe O2 dependent COPD - he is on torsemide  80mg  bid, metolazine 2.5mg  daily.  - appears euvolemic, CXR without edema and normal BNP.  - can continue home diuretic regiment with torsemide  80mg  bid, metolazone  2.5mg  every other day - repeat echo pending   3.HFimpEF - LVEF has been difficult to assess from prior echos given limited visualization - last LVEF 50-55% after further reviewed during 03/2022 admission. According to [facility] notes had previously been as low as 40-45%.  - repeat echo pending - appears euvolemic, continue current diuretic regimen -changed toprol  to bisoprolol  given severe COPD   4. COPD exacerbation - per primary team, he is on home O2 chronically 4L - fairly significant chronic respiratory acidosis with pCO2 111 and metabolic alkalosis to compensate with bicab at 76, pH 7.4        For questions or updates, please contact [facility] Please consult [email or web address] for contact info under         Signed, Armida Lander, MD  08/10/2023, 8:20 AM

## 2023-08-10 NOTE — Progress Notes (Signed)
 Pt in atrial fibrillation with rate 140. Plan to give a dose of IV Lopressor .

## 2023-08-10 NOTE — Inpatient Diabetes Management (Signed)
 Inpatient Diabetes Program Recommendations  AACE/ADA: New Consensus Statement on Inpatient Glycemic Control   Target Ranges:  Prepandial:   less than 140 mg/dL      Peak postprandial:   less than 180 mg/dL (1-2 hours)      Critically ill patients:  140 - 180 mg/dL    Latest Reference Range & Units 08/09/23 11:52 08/09/23 17:24 08/09/23 20:41 08/10/23 03:16 08/10/23 07:29  Glucose-Capillary 70 - 99 mg/dL 409 (H) 811 (H) 914 (H) 339 (H) 106 (H)    Latest Reference Range & Units 08/09/23 04:56  Hemoglobin A1C 4.8 - 5.6 % 6.0 (H)   Review of Glycemic Control  Diabetes history: DM2 Outpatient Diabetes medications: None Current orders for Inpatient glycemic control: Novolog  0-20 units TID with meals, Novolog  0-5 units at bedtime; Solumedrol 60 mg Q12H  Inpatient Diabetes Program Recommendations:    Insulin : If steroids are continued as ordered, may want to consider ordering Semglee  8 units Q24H.  Thanks, Beacher Limerick, RN, MSN, CDCES Diabetes Coordinator Inpatient Diabetes Program [phone] (Team Pager from 8am to 5pm)

## 2023-08-10 NOTE — Care Management Important Message (Signed)
 Important Message  Patient Details  Name: Brian Parsons MRN: [id number] Date of Birth: [birth year]   Important Message Given:  Yes - Medicare IM     Anyelo Mccue L Lysa Livengood 08/10/2023, 1:07 PM

## 2023-08-10 NOTE — Progress Notes (Addendum)
 PROGRESS NOTE   Brian Parsons  ZOX:[id number] DOB: May 23, 1950 DOA: 08/08/2023 PCP: Jonah Negus, NP   Chief Complaint  Patient presents with   Shortness of Breath   Level of care: Telemetry  Brief Admission History:  73 y.o. male,  with a history of DVT tobacco use, severe 4L O2-dependent COPD, secondary polycythemia treated with phlebotomy, chronic HFpEF, HTN, T2DM, nonobstructive CAD - Presents to ED secondary to complaints of shortness of breath, cough, congestion, patient reports symptoms ongoing for last week, reports cough, congestion, reports his girlfriend has been sick a week ago, but she was told she does not have any viral infection, giving worsening of symptoms he came to ED today. - Had significant wheezing in ED, improved with magnesium  sulfate and IV steroids and nebulizer treatment, chest x-ray significant for cardiomegaly, atelectasis, labs were significant for pH of 7.44, PCO2 of 111, sodium of 134, chloride of 68, and creatinine at baseline of 1.48, his white blood cell count at 11.5, hemoglobin at 17.3, EKG concerning for a flutter, [facility] hospitalist consulted to admit.   Assessment and Plan:  Acute on chronic atrial failure with hypoxia and hypercapnia Acute COPD exacerbation  - admitted with dyspnea, cough, wheezing. - Admitted under COPD protocol, continue with IV steroids, scheduled DuoNebs and as needed albuterol -was encouraged to use incentive spirometry and flutter valve. - continue IV Rocephin   - presented with significant CO2 retention but compensated and chronic  - Target oxygen  saturation between 90 and 93 no more. - resumed home bronchodilators   CKD stage IIIb - Renal function at baseline, avoid nephrotoxic medication   New onset Atrial Flutter  - EKG more consistent with atrial flutter - he has been seen by inpatient cardiology and started on bisoprolol  2.5 mg daily given severe resp disease  - agree with cardiology with starting him on  apixaban  5 mg BID given high CHADVASC score  - pending rates tomorrow will decide if dose of bisoprolol  will need to be titrated - Monitor K and Mg, both repleted.   Chronic systolic, diastolic CHF  Cor pulmonale  - Continue with home dose Zaroxolyn  and torsemide     Secondary polycythemia:  -Due to chronic hypoxia.    Tobacco abuse - He was counseled, now on nicotine  patch   DNR present on admission  - pt requested to continue DNR order while in hospital    Type 2 DM with steroid induced hyperglycemia - added semglee  12 units, added novolog  5 units TID with meals eaten>50% - reducing dose of IV steroids today  DVT prophylaxis: apixaban   Code Status: DNR  Family Communication: none present  Disposition: Status is: Inpatient   Consultants:  Cardiology   Procedures:    Antimicrobials:  Ceftriaxone  IV 4/23 >>   Subjective: Says he wants to go home, but willing to stay until tomorrow morning.      Objective: Vitals:   08/09/23 2353 08/10/23 0305 08/10/23 0310 08/10/23 0928  BP:   126/81   Pulse:   99   Resp:   18   Temp:   98.1 F (36.7 C)   TempSrc:   Oral   SpO2: 94% 97% 100% 92%  Weight:      Height:        Intake/Output Summary (Last 24 hours) at 08/10/2023 1245 Last data filed at 08/10/2023 0921 Gross per 24 hour  Intake 880 ml  Output 575 ml  Net 305 ml   Filed Weights   08/08/23 1144 08/08/23 2032  Weight:  83.9 kg 86.2 kg   Examination:  General exam: Appears calm and comfortable  Respiratory system: improved expiratory wheezing and some rales heard on left side.  Cardiovascular system: irregularly irregular, normal S1 & S2 heard. No JVD, murmurs, rubs, gallops or clicks. No pedal edema. Gastrointestinal system: Abdomen is nondistended, soft and nontender. No organomegaly or masses felt. Normal bowel sounds heard. Central nervous system: Alert and oriented. No focal neurological deficits. Extremities: Symmetric 5 x 5 power. Skin: No rashes,  lesions or ulcers. Psychiatry: Judgement and insight appear normal. Mood & affect appropriate.   Data Reviewed: I have personally reviewed following labs and imaging studies  CBC: Recent Labs  Lab 08/08/23 1258 08/09/23 0456  WBC 11.5* 7.4  NEUTROABS 8.6*  --   HGB 17.3* 16.4  HCT 56.2* 51.9  MCV 96.9 93.9  PLT 198 187    Basic Metabolic Panel: Recent Labs  Lab 08/08/23 1258 08/09/23 0456 08/10/23 0444  NA 134* 134* 135  K 3.7 3.9 3.5  CL 68* 71* 74*  CO2 >45* >45* >45*  GLUCOSE 117* 246* 202*  BUN 76* 69* 75*  CREATININE 1.48* 1.48* 1.48*  CALCIUM  9.5 9.3 9.0  MG  --   --  2.6*    CBG: Recent Labs  Lab 08/09/23 1724 08/09/23 2041 08/10/23 0316 08/10/23 0729 08/10/23 1119  GLUCAP 291* 191* 339* 106* 200*    Recent Results (from the past 240 hours)  SARS Coronavirus 2 by RT PCR (hospital order, performed in [facility] hospital lab) *cepheid single result test* Anterior Nasal Swab     Status: None   Collection Time: 08/08/23  5:17 PM   Specimen: Anterior Nasal Swab  Result Value Ref Range Status   SARS Coronavirus 2 by RT PCR NEGATIVE NEGATIVE Final    Comment: (NOTE) SARS-CoV-2 target nucleic acids are NOT DETECTED.  The SARS-CoV-2 RNA is generally detectable in upper and lower respiratory specimens during the acute phase of infection. The lowest concentration of SARS-CoV-2 viral copies this assay can detect is 250 copies / mL. A negative result does not preclude SARS-CoV-2 infection and should not be used as the sole basis for treatment or other patient management decisions.  A negative result may occur with improper specimen collection / handling, submission of specimen other than nasopharyngeal swab, presence of viral mutation(s) within the areas targeted by this assay, and inadequate number of viral copies (<250 copies / mL). A negative result must be combined with clinical observations, patient history, and epidemiological information.  Fact  Sheet for Patients:   RoadLapTop.co.za  Fact Sheet for Healthcare Providers: http://kim-miller.com/  This test is not yet approved or  cleared by the [place]  FDA and has been authorized for detection and/or diagnosis of SARS-CoV-2 by FDA under an Emergency Use Authorization (EUA).  This EUA will remain in effect (meaning this test can be used) for the duration of the COVID-19 declaration under Section 564(b)(1) of the Act, 21 U.S.C. section 360bbb-3(b)(1), unless the authorization is terminated or revoked sooner.  Performed at [facility], [address] [place], [place] [address]   MRSA Next Gen by PCR, Nasal     Status: None   Collection Time: 08/08/23  8:18 PM   Specimen: Nasal Mucosa; Nasal Swab  Result Value Ref Range Status   MRSA by PCR Next Gen NOT DETECTED NOT DETECTED Final    Comment: (NOTE) The GeneXpert MRSA Assay (FDA approved for NASAL specimens only), is one component of a comprehensive MRSA colonization surveillance  program. It is not intended to diagnose MRSA infection nor to guide or monitor treatment for MRSA infections. Test performance is not FDA approved in patients less than 43 years old. Performed at [facility], [address] [place], [place] [address]      Radiology Studies: No results found.   Scheduled Meds:  allopurinol   100 mg Oral BID   apixaban   5 mg Oral BID   atorvastatin   20 mg Oral q1800   azelastine   1 spray Each Nare BID   bisoprolol   2.5 mg Oral Daily   budeson-glycopyrrolate -formoterol   2 puff Inhalation BID   busPIRone   7.5 mg Oral BID   Chlorhexidine  Gluconate Cloth  6 each Topical Daily   cyanocobalamin   1,000 mcg Oral Daily   doxepin   10 mg Oral QHS   fluticasone   1 spray Each Nare Daily   insulin  aspart  0-20 Units Subcutaneous TID WC   insulin  aspart  0-5 Units Subcutaneous QHS   insulin  aspart  5 Units Subcutaneous TID WC   insulin  glargine-yfgn  12 Units Subcutaneous  QHS   ipratropium-albuterol   3 mL Nebulization Q4H   methylPREDNISolone  sodium succinate  40 mg Intravenous Q12H   metolazone   2.5 mg Oral QODAY   pantoprazole   40 mg Oral Daily   torsemide   80 mg Oral BID   Continuous Infusions:  cefTRIAXone  (ROCEPHIN )  IV Stopped (08/08/23 1942)     LOS: 2 days   Time spent: 50 mins  Joniah Bednarski Lincoln Renshaw, MD How to contact the [facility] Attending or Consulting provider 7A - 7P or covering provider during after hours 7P -7A, for this patient?  Check the care team in [facility] and look for a) attending/consulting [facility] provider listed and b) the [facility] team listed Log into [email or web address] to find provider on call.  Locate the [facility] provider you are looking for under [facility] and page to a number that you can be directly reached. If you still have difficulty reaching the provider, please page the [facility] (Director on Call) for the Hospitalists listed on amion for assistance.  08/10/2023, 12:45 PM

## 2023-08-10 NOTE — Progress Notes (Signed)
 [id number] and [phone] Patient still eating breakfast unavailable for nebulizer treatment.

## 2023-08-10 NOTE — Progress Notes (Signed)
 1250 Patient still eating lunch unavailable for nebulizer treatment.

## 2023-08-11 ENCOUNTER — Inpatient Hospital Stay (HOSPITAL_COMMUNITY)

## 2023-08-11 DIAGNOSIS — R0609 Other forms of dyspnea: Secondary | ICD-10-CM | POA: Diagnosis not present

## 2023-08-11 DIAGNOSIS — F17210 Nicotine dependence, cigarettes, uncomplicated: Secondary | ICD-10-CM

## 2023-08-11 DIAGNOSIS — I4892 Unspecified atrial flutter: Secondary | ICD-10-CM | POA: Diagnosis not present

## 2023-08-11 DIAGNOSIS — I498 Other specified cardiac arrhythmias: Secondary | ICD-10-CM | POA: Diagnosis not present

## 2023-08-11 DIAGNOSIS — J9621 Acute and chronic respiratory failure with hypoxia: Secondary | ICD-10-CM | POA: Diagnosis not present

## 2023-08-11 LAB — ECHOCARDIOGRAM COMPLETE
AR max vel: 2.63 cm2
AV Area VTI: 2.78 cm2
AV Area mean vel: 2.37 cm2
AV Mean grad: 5 mmHg
AV Peak grad: 7.4 mmHg
Ao pk vel: 1.36 m/s
Area-P 1/2: 5.58 cm2
Height: 67 in
S' Lateral: 4.2 cm
Weight: 3040.58 [oz_av]

## 2023-08-11 LAB — GLUCOSE, CAPILLARY
Glucose-Capillary: 223 mg/dL — ABNORMAL HIGH (ref 70–99)
Glucose-Capillary: 208 mg/dL — ABNORMAL HIGH (ref 70–99)

## 2023-08-11 MED ORDER — PANTOPRAZOLE SODIUM 40 MG PO TBEC
40.0000 mg | DELAYED_RELEASE_TABLET | Freq: Every day | ORAL | 0 refills | Status: AC
Start: 2023-08-12 — End: 2023-08-26

## 2023-08-11 MED ORDER — INSULIN ASPART 100 UNIT/ML IJ SOLN: 8.0000 [IU] | Freq: Three times a day (TID) | INTRAMUSCULAR | Status: DC

## 2023-08-11 MED ORDER — PERFLUTREN LIPID MICROSPHERE
1.0000 mL | INTRAVENOUS | Status: AC | PRN
  Administered 2023-08-11: 2 mL via INTRAVENOUS

## 2023-08-11 MED ORDER — APIXABAN 5 MG PO TABS: 5.0000 mg | ORAL_TABLET | Freq: Two times a day (BID) | ORAL | 2 refills | Status: DC

## 2023-08-11 MED ORDER — BISOPROLOL FUMARATE 5 MG PO TABS: 2.5000 mg | ORAL_TABLET | Freq: Every day | ORAL | 2 refills | Status: DC

## 2023-08-11 MED ORDER — PREDNISONE 20 MG PO TABS
40.0000 mg | ORAL_TABLET | Freq: Every day | ORAL | 0 refills | Status: DC
Start: 2023-08-11 — End: 2023-08-21

## 2023-08-11 NOTE — Discharge Summary (Signed)
 Physician Discharge Summary  Brian Parsons ZOX:[id number] DOB: March 07, 1951 DOA: 08/08/2023  PCP: Brian Negus, NP Cardiology: Branch  Admit date: 08/08/2023 Discharge date: 08/11/2023  Admitted From:  Home  Disposition: home   Recommendations for Outpatient Follow-up:  Follow up with PCP in 1 weeks Follow up with Dr. Amanda Parsons on 09/24/23 as scheduled Please obtain BMP/CBC in one week Please follow up on the following pending results: TTE  Discharge Condition: STABLE   CODE STATUS: DNR DIET: Heart healthy low sodium foods recommended   Brief Hospitalization Summary: Please see all hospital notes, images, labs for full details of the hospitalization. Admission provider HPI:  73 y.o. male,  with a history of DVT tobacco use, severe 4L O2-dependent COPD, secondary polycythemia treated with phlebotomy, chronic HFpEF, HTN, T2DM, nonobstructive CAD - Presents to ED secondary to complaints of shortness of breath, cough, congestion, patient reports symptoms ongoing for last week, reports cough, congestion, reports his girlfriend has been sick a week ago, but she was told she does not have any viral infection, giving worsening of symptoms he came to ED today. - Had significant wheezing in ED, improved with magnesium  sulfate and IV steroids and nebulizer treatment, chest x-ray significant for cardiomegaly, atelectasis, labs were significant for pH of 7.44, PCO2 of 111, sodium of 134, chloride of 68, and creatinine at baseline of 1.48, his white blood cell count at 11.5, hemoglobin at 17.3, EKG concerning for a flutter, [facility] consulted to admit.  Hospital Course by problem list   Acute on chronic atrial failure with hypoxia and hypercapnia Acute COPD exacerbation  - admitted with dyspnea, cough, wheezing. - Admitted under COPD protocol, treated with IV steroids, scheduled DuoNebs and as needed albuterol -was encouraged to use incentive spirometry and flutter valve. - treated with IV  Rocephin   - presented with significant CO2 retention but compensated and chronic  - Target oxygen  saturation goal >87%  - resumed home bronchodilators   CKD stage IIIb - Renal function at baseline, avoid nephrotoxic medication   New onset Atrial Flutter  - EKG more consistent with atrial flutter - he has been seen by inpatient cardiology and started on bisoprolol  2.5 mg daily given severe resp disease  - agree with cardiology with starting him on apixaban  5 mg BID given high CHADVASC score  - heart rates have been stable and controlled on bisoprolol  2.5 mg daily which we will continue - K and Mg, both repleted. - cardiology consulted, TTE was ordered and pending; done prior to discharge,  - outpatient follow up with Dr. Amanda Parsons scheduled    Chronic systolic, diastolic CHF  Cor pulmonale  - Continued with home dose Zaroxolyn  and torsemide  which he has tolerated and diuresing well in the hospital [facility] Weights   08/08/23 1144 08/08/23 2032  Weight: 83.9 kg 86.2 kg    Intake/Output Summary (Last 24 hours) at 08/11/2023 1022 Last data filed at 08/11/2023 1016 Gross per 24 hour  Intake 880 ml  Output 2200 ml  Net -1320 ml    Secondary polycythemia:  -Due to chronic hypoxia.    Tobacco abuse - He was counseled on cessation, now on nicotine  patch   DNR present on admission  - pt requested to continue DNR order while in hospital     Type 2 DM with steroid induced hyperglycemia - insulin  in hospital given: semglee  12 units, added novolog  5 units TID with meals eaten>50% - reducing dose of steroids as he is improving clinically - continue monitoring CBG at home  and follow up with PCP   Discharge Diagnoses:  Active Problems:   Acute on chronic respiratory failure (HCC)   Acute diastolic CHF (congestive heart failure) (HCC)   Cigarette smoker   COPD exacerbation (HCC)   Type 2 diabetes mellitus with stage 3a chronic kidney disease, without long-term current use of insulin   [facility])   Atrial arrhythmia   Atrial flutter [facility])   Discharge Instructions: Discharge Instructions     Ambulatory referral to Cardiology   Complete by: As directed    If you have not heard from the Cardiology office within the next 72 hours please call [phone].      Allergies as of 08/11/2023       Reactions   Citalopram Other (See Comments)   GI bleed   Bupropion Other (See Comments)   Unknown   Escitalopram  Other (See Comments)   Unknown   Penicillins Hives   Trazodone Hcl Other (See Comments)   Unknown   Varenicline Tartrate Other (See Comments)   Bad dreams   Amoxicillin Hives, Rash   Penicillin G Rash        Medication List     STOP taking these medications    aspirin  EC 81 MG tablet   metoprolol  succinate 25 MG 24 hr tablet Commonly known as: TOPROL -XL       TAKE these medications    albuterol  0.63 MG/3ML nebulizer solution Commonly known as: ACCUNEB  Take 3 mLs (0.63 mg total) by nebulization every 4 (four) hours as needed.   albuterol  108 (90 Base) MCG/ACT inhaler Commonly known as: VENTOLIN  HFA Inhale 2 puffs into the lungs every 6 (six) hours as needed for wheezing or shortness of breath.   allopurinol  100 MG tablet Commonly known as: ZYLOPRIM  Take 200 mg by mouth 2 (two) times daily.   apixaban  5 MG Tabs tablet Commonly known as: ELIQUIS  Take 1 tablet (5 mg total) by mouth 2 (two) times daily.   atorvastatin  20 MG tablet Commonly known as: LIPITOR Take 20 mg by mouth daily at 6 PM.   azelastine  0.1 % nasal spray Commonly known as: ASTELIN  Place 1 spray into both nostrils 2 (two) times daily.   bisoprolol  5 MG tablet Commonly known as: ZEBETA  Take 0.5 tablets (2.5 mg total) by mouth daily. Start taking on: August 12, 2023   busPIRone  7.5 MG tablet Commonly known as: BUSPAR  Take 7.5 mg by mouth 2 (two) times daily.   colchicine 0.6 MG tablet Take 0.6 mg by mouth as needed (gout).   cyanocobalamin  1000 MCG tablet Commonly  known as: VITAMIN B12 Take 1,000 mcg by mouth daily.   doxepin  10 MG capsule Commonly known as: SINEQUAN  Take 10 mg by mouth at bedtime.   doxycycline  100 MG tablet Commonly known as: VIBRA -TABS Take 1 tablet (100 mg total) by mouth 2 (two) times daily for 7 days.   fluticasone  50 MCG/ACT nasal spray Commonly known as: FLONASE  Place 1 spray into both nostrils daily.   ipratropium-albuterol  0.5-2.5 (3) MG/3ML Soln Commonly known as: DUONEB Take 3 mLs by nebulization every 4 (four) hours as needed.   LORazepam  0.5 MG tablet Commonly known as: ATIVAN  Take 0.5 mg by mouth daily as needed for anxiety.   magnesium  gluconate 500 (27 Mg) MG Tabs tablet Commonly known as: MAGONATE Take 500 mg by mouth in the morning and at bedtime.   metolazone  2.5 MG tablet Commonly known as: ZAROXOLYN  Take 2.5 mg by mouth every other day.   pantoprazole  40 MG tablet Commonly  known as: PROTONIX  Take 1 tablet (40 mg total) by mouth daily for 14 days. Start taking on: August 12, 2023   potassium chloride  SA 20 MEQ tablet Commonly known as: KLOR-CON  M Take 20 mEq by mouth daily.   predniSONE  20 MG tablet Commonly known as: DELTASONE  Take 2 tablets (40 mg total) by mouth daily with breakfast for 7 days.   ramelteon 8 MG tablet Commonly known as: ROZEREM Take 8 mg by mouth at bedtime as needed for sleep.   sildenafil 50 MG tablet Commonly known as: VIAGRA Take 50 mg by mouth as needed for erectile dysfunction.   torsemide  20 MG tablet Commonly known as: DEMADEX  Take 80 mg by mouth 2 (two) times daily.   Trelegy Ellipta  100-62.5-25 MCG/ACT Aepb Generic drug: Fluticasone -Umeclidin-Vilant One click each am   True Metrix Meter Devi See admin instructions.   ZyrTEC Allergy 10 MG tablet Generic drug: cetirizine Take 10 mg by mouth daily.        Follow-up Information     Brian Negus, NP. Schedule an appointment as soon as possible for a visit in 1 week(s).   Specialty: Nurse  Practitioner Why: Hospital Follow Up Contact information: [address] [place] [address] [phone]         Laurann Pollock, MD. Go on 09/24/2023.   Specialty: Cardiology Contact information: [address] [place] [address] [phone]                Allergies  Allergen Reactions   Citalopram Other (See Comments)    GI bleed   Bupropion Other (See Comments)    Unknown   Escitalopram  Other (See Comments)    Unknown   Penicillins Hives   Trazodone Hcl Other (See Comments)    Unknown   Varenicline Tartrate Other (See Comments)    Bad dreams   Amoxicillin Hives and Rash   Penicillin G Rash   Allergies as of 08/11/2023       Reactions   Citalopram Other (See Comments)   GI bleed   Bupropion Other (See Comments)   Unknown   Escitalopram  Other (See Comments)   Unknown   Penicillins Hives   Trazodone Hcl Other (See Comments)   Unknown   Varenicline Tartrate Other (See Comments)   Bad dreams   Amoxicillin Hives, Rash   Penicillin G Rash        Medication List     STOP taking these medications    aspirin  EC 81 MG tablet   metoprolol  succinate 25 MG 24 hr tablet Commonly known as: TOPROL -XL       TAKE these medications    albuterol  0.63 MG/3ML nebulizer solution Commonly known as: ACCUNEB  Take 3 mLs (0.63 mg total) by nebulization every 4 (four) hours as needed.   albuterol  108 (90 Base) MCG/ACT inhaler Commonly known as: VENTOLIN  HFA Inhale 2 puffs into the lungs every 6 (six) hours as needed for wheezing or shortness of breath.   allopurinol  100 MG tablet Commonly known as: ZYLOPRIM  Take 200 mg by mouth 2 (two) times daily.   apixaban  5 MG Tabs tablet Commonly known as: ELIQUIS  Take 1 tablet (5 mg total) by mouth 2 (two) times daily.   atorvastatin  20 MG tablet Commonly known as: LIPITOR Take 20 mg by mouth daily at 6 PM.   azelastine  0.1 % nasal spray Commonly known as: ASTELIN  Place 1 spray into both nostrils 2  (two) times daily.   bisoprolol  5 MG tablet Commonly known  as: ZEBETA  Take 0.5 tablets (2.5 mg total) by mouth daily. Start taking on: August 12, 2023   busPIRone  7.5 MG tablet Commonly known as: BUSPAR  Take 7.5 mg by mouth 2 (two) times daily.   colchicine 0.6 MG tablet Take 0.6 mg by mouth as needed (gout).   cyanocobalamin  1000 MCG tablet Commonly known as: VITAMIN B12 Take 1,000 mcg by mouth daily.   doxepin  10 MG capsule Commonly known as: SINEQUAN  Take 10 mg by mouth at bedtime.   doxycycline  100 MG tablet Commonly known as: VIBRA -TABS Take 1 tablet (100 mg total) by mouth 2 (two) times daily for 7 days.   fluticasone  50 MCG/ACT nasal spray Commonly known as: FLONASE  Place 1 spray into both nostrils daily.   ipratropium-albuterol  0.5-2.5 (3) MG/3ML Soln Commonly known as: DUONEB Take 3 mLs by nebulization every 4 (four) hours as needed.   LORazepam  0.5 MG tablet Commonly known as: ATIVAN  Take 0.5 mg by mouth daily as needed for anxiety.   magnesium  gluconate 500 (27 Mg) MG Tabs tablet Commonly known as: MAGONATE Take 500 mg by mouth in the morning and at bedtime.   metolazone  2.5 MG tablet Commonly known as: ZAROXOLYN  Take 2.5 mg by mouth every other day.   pantoprazole  40 MG tablet Commonly known as: PROTONIX  Take 1 tablet (40 mg total) by mouth daily for 14 days. Start taking on: August 12, 2023   potassium chloride  SA 20 MEQ tablet Commonly known as: KLOR-CON  M Take 20 mEq by mouth daily.   predniSONE  20 MG tablet Commonly known as: DELTASONE  Take 2 tablets (40 mg total) by mouth daily with breakfast for 7 days.   ramelteon 8 MG tablet Commonly known as: ROZEREM Take 8 mg by mouth at bedtime as needed for sleep.   sildenafil 50 MG tablet Commonly known as: VIAGRA Take 50 mg by mouth as needed for erectile dysfunction.   torsemide  20 MG tablet Commonly known as: DEMADEX  Take 80 mg by mouth 2 (two) times daily.   Trelegy Ellipta  100-62.5-25  MCG/ACT Aepb Generic drug: Fluticasone -Umeclidin-Vilant One click each am   True Metrix Meter Devi See admin instructions.   ZyrTEC Allergy 10 MG tablet Generic drug: cetirizine Take 10 mg by mouth daily.        Procedures/Studies: DG Chest Port 1 View Result Date: 08/08/2023 CLINICAL DATA:  Dyspnea EXAM: PORTABLE CHEST 1 VIEW COMPARISON:  March 19, 2022 FINDINGS: Streaky bibasilar atelectasis. No focal airspace consolidation, pleural effusion, or pneumothorax. Redemonstrated cardiomegaly. Aortic atherosclerosis. No acute fracture or destructive lesions. Multilevel thoracic osteophytosis. IMPRESSION: Unchanged cardiomegaly with streaky bibasilar atelectasis. Electronically Signed   By: Rance Burrows M.D.   On: 08/08/2023 12:19     Subjective: Pt reporting that he is breathing much better, he is insisting on going home today.   Discharge Exam: Vitals:   08/11/23 0834 08/11/23 0853  BP: 113/76   Pulse: 98   Resp:    Temp:    SpO2: 91% 91%   Vitals:   08/10/23 2053 08/11/23 0251 08/11/23 0834 08/11/23 0853  BP: 112/76 104/72 113/76   Pulse: 97 99 98   Resp: 18 18    Temp: 97.6 F (36.4 C) 97.7 F (36.5 C)    TempSrc: Oral Oral    SpO2: 96% 94% 91% 91%  Weight:      Height:        General: Pt is alert, awake, not in acute distress Cardiovascular: normal S1/S2 +, no rubs, no gallops Respiratory: good air  movement bilateral with expiratory wheezing heard, no rales.  Abdominal: Soft, NT, ND, bowel sounds + Extremities: no edema, no cyanosis   The results of significant diagnostics from this hospitalization (including imaging, microbiology, ancillary and laboratory) are listed below for reference.     Microbiology: Recent Results (from the past 240 hours)  SARS Coronavirus 2 by RT PCR (hospital order, performed in [facility] hospital lab) *cepheid single result test* Anterior Nasal Swab     Status: None   Collection Time: 08/08/23  5:17 PM   Specimen: Anterior  Nasal Swab  Result Value Ref Range Status   SARS Coronavirus 2 by RT PCR NEGATIVE NEGATIVE Final    Comment: (NOTE) SARS-CoV-2 target nucleic acids are NOT DETECTED.  The SARS-CoV-2 RNA is generally detectable in upper and lower respiratory specimens during the acute phase of infection. The lowest concentration of SARS-CoV-2 viral copies this assay can detect is 250 copies / mL. A negative result does not preclude SARS-CoV-2 infection and should not be used as the sole basis for treatment or other patient management decisions.  A negative result may occur with improper specimen collection / handling, submission of specimen other than nasopharyngeal swab, presence of viral mutation(s) within the areas targeted by this assay, and inadequate number of viral copies (<250 copies / mL). A negative result must be combined with clinical observations, patient history, and epidemiological information.  Fact Sheet for Patients:   RoadLapTop.co.za  Fact Sheet for Healthcare Providers: http://kim-miller.com/  This test is not yet approved or  cleared by the [place]  FDA and has been authorized for detection and/or diagnosis of SARS-CoV-2 by FDA under an Emergency Use Authorization (EUA).  This EUA will remain in effect (meaning this test can be used) for the duration of the COVID-19 declaration under Section 564(b)(1) of the Act, 21 U.S.C. section 360bbb-3(b)(1), unless the authorization is terminated or revoked sooner.  Performed at [facility], [address] [place], [place] [address]   MRSA Next Gen by PCR, Nasal     Status: None   Collection Time: 08/08/23  8:18 PM   Specimen: Nasal Mucosa; Nasal Swab  Result Value Ref Range Status   MRSA by PCR Next Gen NOT DETECTED NOT DETECTED Final    Comment: (NOTE) The GeneXpert MRSA Assay (FDA approved for NASAL specimens only), is one component of a comprehensive MRSA colonization  surveillance program. It is not intended to diagnose MRSA infection nor to guide or monitor treatment for MRSA infections. Test performance is not FDA approved in patients less than 35 years old. Performed at [facility], [address], [place], [place] [address]      Labs: BNP (last 3 results) Recent Labs    08/08/23 1258 08/09/23 0456  BNP 48.0 65.0   Basic Metabolic Panel: Recent Labs  Lab 08/08/23 1258 08/09/23 0456 08/10/23 0444  NA 134* 134* 135  K 3.7 3.9 3.5  CL 68* 71* 74*  CO2 >45* >45* >45*  GLUCOSE 117* 246* 202*  BUN 76* 69* 75*  CREATININE 1.48* 1.48* 1.48*  CALCIUM  9.5 9.3 9.0  MG  --   --  2.6*   Liver Function Tests: Recent Labs  Lab 08/08/23 1258  AST 19  ALT 15  ALKPHOS 86  BILITOT 1.3*  PROT 7.1  ALBUMIN 3.4*   No results for input(s): "LIPASE", "AMYLASE" in the last 168 hours. No results for input(s): "AMMONIA" in the last 168 hours. CBC: Recent Labs  Lab 08/08/23 1258 08/09/23 0456  WBC 11.5* 7.4  NEUTROABS 8.6*  --   HGB 17.3* 16.4  HCT 56.2* 51.9  MCV 96.9 93.9  PLT 198 187   Cardiac Enzymes: No results for input(s): "CKTOTAL", "CKMB", "CKMBINDEX", "TROPONINI" in the last 168 hours. BNP: Invalid input(s): "POCBNP" CBG: Recent Labs  Lab 08/10/23 1119 08/10/23 1631 08/10/23 2051 08/11/23 0256 08/11/23 0742  GLUCAP 200* 306* 251* 223* 208*   D-Dimer No results for input(s): "DDIMER" in the last 72 hours. Hgb A1c Recent Labs    08/09/23 0456  HGBA1C 6.0*   Lipid Profile No results for input(s): "CHOL", "HDL", "LDLCALC", "TRIG", "CHOLHDL", "LDLDIRECT" in the last 72 hours. Thyroid function studies No results for input(s): "TSH", "T4TOTAL", "T3FREE", "THYROIDAB" in the last 72 hours.  Invalid input(s): "FREET3" Anemia work up No results for input(s): "VITAMINB12", "FOLATE", "FERRITIN", "TIBC", "IRON", "RETICCTPCT" in the last 72 hours. Urinalysis    Component Value Date/Time   COLORURINE YELLOW 11/22/2015  1403   APPEARANCEUR CLEAR 11/22/2015 1403   LABSPEC 1.010 11/22/2015 1403   PHURINE 5.5 11/22/2015 1403   GLUCOSEU NEGATIVE 11/22/2015 1403   HGBUR NEGATIVE 11/22/2015 1403   BILIRUBINUR NEGATIVE 11/22/2015 1403   KETONESUR NEGATIVE 11/22/2015 1403   PROTEINUR NEGATIVE 11/22/2015 1403   NITRITE NEGATIVE 11/22/2015 1403   LEUKOCYTESUR NEGATIVE 11/22/2015 1403   Sepsis Labs Recent Labs  Lab 08/08/23 1258 08/09/23 0456  WBC 11.5* 7.4   Microbiology Recent Results (from the past 240 hours)  SARS Coronavirus 2 by RT PCR (hospital order, performed in [facility] hospital lab) *cepheid single result test* Anterior Nasal Swab     Status: None   Collection Time: 08/08/23  5:17 PM   Specimen: Anterior Nasal Swab  Result Value Ref Range Status   SARS Coronavirus 2 by RT PCR NEGATIVE NEGATIVE Final    Comment: (NOTE) SARS-CoV-2 target nucleic acids are NOT DETECTED.  The SARS-CoV-2 RNA is generally detectable in upper and lower respiratory specimens during the acute phase of infection. The lowest concentration of SARS-CoV-2 viral copies this assay can detect is 250 copies / mL. A negative result does not preclude SARS-CoV-2 infection and should not be used as the sole basis for treatment or other patient management decisions.  A negative result may occur with improper specimen collection / handling, submission of specimen other than nasopharyngeal swab, presence of viral mutation(s) within the areas targeted by this assay, and inadequate number of viral copies (<250 copies / mL). A negative result must be combined with clinical observations, patient history, and epidemiological information.  Fact Sheet for Patients:   RoadLapTop.co.za  Fact Sheet for Healthcare Providers: http://kim-miller.com/  This test is not yet approved or  cleared by the [place]  FDA and has been authorized for detection and/or diagnosis of SARS-CoV-2 by FDA  under an Emergency Use Authorization (EUA).  This EUA will remain in effect (meaning this test can be used) for the duration of the COVID-19 declaration under Section 564(b)(1) of the Act, 21 U.S.C. section 360bbb-3(b)(1), unless the authorization is terminated or revoked sooner.  Performed at [facility], [address], [place], [place] [address]   MRSA Next Gen by PCR, Nasal     Status: None   Collection Time: 08/08/23  8:18 PM   Specimen: Nasal Mucosa; Nasal Swab  Result Value Ref Range Status   MRSA by PCR Next Gen NOT DETECTED NOT DETECTED Final    Comment: (NOTE) The GeneXpert MRSA Assay (FDA approved for NASAL specimens only), is one component of a  comprehensive MRSA colonization surveillance program. It is not intended to diagnose MRSA infection nor to guide or monitor treatment for MRSA infections. Test performance is not FDA approved in patients less than 95 years old. Performed at [facility], [address], [place], [place] [address]    Time coordinating discharge:   40 mins  SIGNED:  Faustino Hook, MD  [facility] 08/11/2023, 10:15 AM How to contact the [facility] Attending or Consulting provider 7A - 7P or covering provider during after hours 7P -7A, for this patient?  Check the care team in [facility] and look for a) attending/consulting [facility] provider listed and b) the [facility] team listed Log into [email or web address] and use [place]'s universal password to access. If you do not have the password, please contact the hospital operator. Locate the [facility] provider you are looking for under [facility] and page to a number that you can be directly reached. If you still have difficulty reaching the provider, please page the [facility] (Director on Call) for the Hospitalists listed on amion for assistance.

## 2023-08-11 NOTE — Progress Notes (Signed)
 Went over discharge instructions w/ pt.

## 2023-08-11 NOTE — Plan of Care (Signed)
  Problem: Skin Integrity: Goal: Risk for impaired skin integrity will decrease Outcome: Progressing   Problem: Education: Goal: Knowledge of disease or condition will improve Outcome: Progressing Goal: Knowledge of the prescribed therapeutic regimen will improve Outcome: Progressing   Problem: Respiratory: Goal: Ability to maintain a clear airway will improve Outcome: Progressing Goal: Ability to maintain adequate ventilation will improve Outcome: Progressing   Problem: Nutritional: Goal: Maintenance of adequate nutrition will improve Outcome: Progressing   Problem: Tissue Perfusion: Goal: Adequacy of tissue perfusion will improve Outcome: Progressing

## 2023-08-11 NOTE — Progress Notes (Signed)
 Mobility Specialist Progress Note:    08/11/23 1025  Mobility  Activity Ambulated with assistance in hallway  Level of Assistance Contact guard assist, steadying assist  Assistive Device Front wheel walker  Distance Ambulated (ft) 40 ft  Range of Motion/Exercises Active;All extremities  Activity Response Tolerated well  Mobility Referral Yes  Mobility visit 1 Mobility  Mobility Specialist Start Time (ACUTE ONLY) 1025  Mobility Specialist Stop Time (ACUTE ONLY) 1046  Mobility Specialist Time Calculation (min) (ACUTE ONLY) 21 min   Pt received in chair, agreeable to mobility. Required CGA to stand and ambulate with RW. Tolerated well, denied SOB throughout session. Returned pt to chair, alarm on. Call bell in reach, all needs met.  Nykayla Marcelli Mobility Specialist Please contact via Special educational needs teacher or  Rehab office at [phone]

## 2023-08-13 ENCOUNTER — Encounter (HOSPITAL_COMMUNITY): Payer: Self-pay

## 2023-08-13 ENCOUNTER — Inpatient Hospital Stay (HOSPITAL_COMMUNITY)

## 2023-08-13 ENCOUNTER — Inpatient Hospital Stay: Admit: 2023-08-13

## 2023-08-13 ENCOUNTER — Inpatient Hospital Stay (HOSPITAL_COMMUNITY)
Admission: EM | Admit: 2023-08-13 | Discharge: 2023-08-21 | DRG: 871 | Disposition: A | Discharge status: Skilled Nursing Facility | Source: Other Acute Inpatient Hospital | Attending: Internal Medicine | Admitting: Internal Medicine

## 2023-08-13 DIAGNOSIS — Z880 Allergy status to penicillin: Secondary | ICD-10-CM

## 2023-08-13 DIAGNOSIS — J440 Chronic obstructive pulmonary disease with acute lower respiratory infection: Secondary | ICD-10-CM | POA: Diagnosis present

## 2023-08-13 DIAGNOSIS — A4152 Sepsis due to Pseudomonas: Secondary | ICD-10-CM | POA: Diagnosis present

## 2023-08-13 DIAGNOSIS — L8990 Pressure ulcer of unspecified site, unspecified stage: Secondary | ICD-10-CM | POA: Diagnosis present

## 2023-08-13 DIAGNOSIS — I130 Hypertensive heart and chronic kidney disease with heart failure and stage 1 through stage 4 chronic kidney disease, or unspecified chronic kidney disease: Secondary | ICD-10-CM | POA: Diagnosis present

## 2023-08-13 DIAGNOSIS — J9622 Acute and chronic respiratory failure with hypercapnia: Secondary | ICD-10-CM | POA: Diagnosis present

## 2023-08-13 DIAGNOSIS — A419 Sepsis, unspecified organism: Secondary | ICD-10-CM | POA: Diagnosis not present

## 2023-08-13 DIAGNOSIS — Z9981 Dependence on supplemental oxygen: Secondary | ICD-10-CM

## 2023-08-13 DIAGNOSIS — I272 Pulmonary hypertension, unspecified: Secondary | ICD-10-CM | POA: Diagnosis present

## 2023-08-13 DIAGNOSIS — Z79899 Other long term (current) drug therapy: Secondary | ICD-10-CM

## 2023-08-13 DIAGNOSIS — E1165 Type 2 diabetes mellitus with hyperglycemia: Secondary | ICD-10-CM | POA: Diagnosis not present

## 2023-08-13 DIAGNOSIS — I3139 Other pericardial effusion (noninflammatory): Secondary | ICD-10-CM | POA: Diagnosis present

## 2023-08-13 DIAGNOSIS — I4891 Unspecified atrial fibrillation: Secondary | ICD-10-CM | POA: Diagnosis present

## 2023-08-13 DIAGNOSIS — J151 Pneumonia due to Pseudomonas: Secondary | ICD-10-CM | POA: Diagnosis present

## 2023-08-13 DIAGNOSIS — Z823 Family history of stroke: Secondary | ICD-10-CM

## 2023-08-13 DIAGNOSIS — J449 Chronic obstructive pulmonary disease, unspecified: Secondary | ICD-10-CM | POA: Diagnosis not present

## 2023-08-13 DIAGNOSIS — F17210 Nicotine dependence, cigarettes, uncomplicated: Secondary | ICD-10-CM | POA: Diagnosis present

## 2023-08-13 DIAGNOSIS — R6521 Severe sepsis with septic shock: Secondary | ICD-10-CM | POA: Diagnosis present

## 2023-08-13 DIAGNOSIS — I4892 Unspecified atrial flutter: Secondary | ICD-10-CM | POA: Diagnosis present

## 2023-08-13 DIAGNOSIS — D751 Secondary polycythemia: Secondary | ICD-10-CM | POA: Diagnosis present

## 2023-08-13 DIAGNOSIS — Z7901 Long term (current) use of anticoagulants: Secondary | ICD-10-CM

## 2023-08-13 DIAGNOSIS — Z66 Do not resuscitate: Secondary | ICD-10-CM | POA: Diagnosis present

## 2023-08-13 DIAGNOSIS — I959 Hypotension, unspecified: Secondary | ICD-10-CM

## 2023-08-13 DIAGNOSIS — J9601 Acute respiratory failure with hypoxia: Principal | ICD-10-CM | POA: Diagnosis present

## 2023-08-13 DIAGNOSIS — I2510 Atherosclerotic heart disease of native coronary artery without angina pectoris: Secondary | ICD-10-CM | POA: Diagnosis present

## 2023-08-13 DIAGNOSIS — E874 Mixed disorder of acid-base balance: Secondary | ICD-10-CM | POA: Diagnosis present

## 2023-08-13 DIAGNOSIS — E440 Moderate protein-calorie malnutrition: Secondary | ICD-10-CM | POA: Diagnosis present

## 2023-08-13 DIAGNOSIS — J9621 Acute and chronic respiratory failure with hypoxia: Secondary | ICD-10-CM | POA: Diagnosis present

## 2023-08-13 DIAGNOSIS — L89152 Pressure ulcer of sacral region, stage 2: Secondary | ICD-10-CM | POA: Diagnosis present

## 2023-08-13 DIAGNOSIS — R339 Retention of urine, unspecified: Secondary | ICD-10-CM | POA: Diagnosis not present

## 2023-08-13 DIAGNOSIS — R7989 Other specified abnormal findings of blood chemistry: Secondary | ICD-10-CM | POA: Diagnosis not present

## 2023-08-13 DIAGNOSIS — I2489 Other forms of acute ischemic heart disease: Secondary | ICD-10-CM | POA: Diagnosis present

## 2023-08-13 DIAGNOSIS — N1832 Chronic kidney disease, stage 3b: Secondary | ICD-10-CM | POA: Diagnosis present

## 2023-08-13 DIAGNOSIS — N179 Acute kidney failure, unspecified: Secondary | ICD-10-CM | POA: Diagnosis present

## 2023-08-13 DIAGNOSIS — Z1152 Encounter for screening for COVID-19: Secondary | ICD-10-CM | POA: Diagnosis not present

## 2023-08-13 DIAGNOSIS — Z6828 Body mass index (BMI) 28.0-28.9, adult: Secondary | ICD-10-CM

## 2023-08-13 DIAGNOSIS — E1122 Type 2 diabetes mellitus with diabetic chronic kidney disease: Secondary | ICD-10-CM | POA: Diagnosis present

## 2023-08-13 DIAGNOSIS — T4275XA Adverse effect of unspecified antiepileptic and sedative-hypnotic drugs, initial encounter: Secondary | ICD-10-CM | POA: Diagnosis not present

## 2023-08-13 DIAGNOSIS — J441 Chronic obstructive pulmonary disease with (acute) exacerbation: Secondary | ICD-10-CM | POA: Diagnosis present

## 2023-08-13 DIAGNOSIS — E119 Type 2 diabetes mellitus without complications: Secondary | ICD-10-CM

## 2023-08-13 DIAGNOSIS — I5032 Chronic diastolic (congestive) heart failure: Secondary | ICD-10-CM | POA: Diagnosis not present

## 2023-08-13 DIAGNOSIS — Z888 Allergy status to other drugs, medicaments and biological substances status: Secondary | ICD-10-CM

## 2023-08-13 DIAGNOSIS — G9341 Metabolic encephalopathy: Secondary | ICD-10-CM | POA: Diagnosis not present

## 2023-08-13 DIAGNOSIS — Z825 Family history of asthma and other chronic lower respiratory diseases: Secondary | ICD-10-CM

## 2023-08-13 DIAGNOSIS — I5043 Acute on chronic combined systolic (congestive) and diastolic (congestive) heart failure: Secondary | ICD-10-CM | POA: Diagnosis not present

## 2023-08-13 LAB — RESPIRATORY PANEL BY PCR

## 2023-08-13 LAB — TROPONIN I (HIGH SENSITIVITY)
Troponin I (High Sensitivity): 61 ng/L — ABNORMAL HIGH (ref <18)
Troponin I (High Sensitivity): 23 ng/L — ABNORMAL HIGH (ref <18)

## 2023-08-13 LAB — CBC WITH DIFFERENTIAL/PLATELET
Abs Immature Granulocytes: 0.39 10*3/uL — ABNORMAL HIGH (ref 0.00–0.07)
Basophils Absolute: 0.1 10*3/uL (ref 0.0–0.1)
Basophils Relative: 0 %
Eosinophils Absolute: 0 10*3/uL (ref 0.0–0.5)
Eosinophils Relative: 0 %
HCT: 55.8 % — ABNORMAL HIGH (ref 39.0–52.0)
Hemoglobin: 17.1 g/dL — ABNORMAL HIGH (ref 13.0–17.0)
Immature Granulocytes: 3 %
Lymphocytes Relative: 6 %
Lymphs Abs: 0.9 10*3/uL (ref 0.7–4.0)
MCH: 30.2 pg (ref 26.0–34.0)
MCHC: 30.6 g/dL (ref 30.0–36.0)
MCV: 98.4 fL (ref 80.0–100.0)
Monocytes Absolute: 0.8 10*3/uL (ref 0.1–1.0)
Monocytes Relative: 5 %
Neutro Abs: 13.1 10*3/uL — ABNORMAL HIGH (ref 1.7–7.7)
Neutrophils Relative %: 86 %
Platelets: 132 10*3/uL — ABNORMAL LOW (ref 150–400)
RBC: 5.67 MIL/uL (ref 4.22–5.81)
RDW: 14.7 % (ref 11.5–15.5)
WBC: 15.3 10*3/uL — ABNORMAL HIGH (ref 4.0–10.5)
nRBC: 0 % (ref 0.0–0.2)

## 2023-08-13 LAB — COMPREHENSIVE METABOLIC PANEL WITH GFR
ALT: 22 U/L (ref 0–44)
AST: 31 U/L (ref 15–41)
Albumin: 2.8 g/dL — ABNORMAL LOW (ref 3.5–5.0)
Alkaline Phosphatase: 70 U/L (ref 38–126)
Anion gap: 25 — ABNORMAL HIGH (ref 5–15)
BUN: 121 mg/dL — ABNORMAL HIGH (ref 8–23)
CO2: 35 mmol/L — ABNORMAL HIGH (ref 22–32)
Calcium: 9 mg/dL (ref 8.9–10.3)
Chloride: 79 mmol/L — ABNORMAL LOW (ref 98–111)
Creatinine, Ser: 2.63 mg/dL — ABNORMAL HIGH (ref 0.61–1.24)
GFR, Estimated: 25 mL/min — ABNORMAL LOW (ref >60)
Glucose, Bld: 221 mg/dL — ABNORMAL HIGH (ref 70–99)
Potassium: 4.7 mmol/L (ref 3.5–5.1)
Sodium: 139 mmol/L (ref 135–145)
Total Bilirubin: 2.8 mg/dL — ABNORMAL HIGH (ref 0.0–1.2)
Total Protein: 5.8 g/dL — ABNORMAL LOW (ref 6.5–8.1)

## 2023-08-13 LAB — APTT: aPTT: 22 s — ABNORMAL LOW (ref 24–36)

## 2023-08-13 LAB — URINALYSIS, ROUTINE W REFLEX MICROSCOPIC
Bilirubin Urine: NEGATIVE
Glucose, UA: NEGATIVE mg/dL
Ketones, ur: 5 mg/dL — AB
Leukocytes,Ua: NEGATIVE
Nitrite: NEGATIVE
Protein, ur: NEGATIVE mg/dL
Specific Gravity, Urine: 1.011 (ref 1.005–1.030)
pH: 5 (ref 5.0–8.0)

## 2023-08-13 LAB — GLUCOSE, CAPILLARY
Glucose-Capillary: 251 mg/dL — ABNORMAL HIGH (ref 70–99)
Glucose-Capillary: 204 mg/dL — ABNORMAL HIGH (ref 70–99)
Glucose-Capillary: 201 mg/dL — ABNORMAL HIGH (ref 70–99)
Glucose-Capillary: 319 mg/dL — ABNORMAL HIGH (ref 70–99)

## 2023-08-13 LAB — POCT I-STAT 7, (LYTES, BLD GAS, ICA,H+H)
Acid-Base Excess: 21 mmol/L — ABNORMAL HIGH (ref 0.0–2.0)
Bicarbonate: 47.6 mmol/L — ABNORMAL HIGH (ref 20.0–28.0)
Calcium, Ion: 1.12 mmol/L — ABNORMAL LOW (ref 1.15–1.40)
HCT: 55 % — ABNORMAL HIGH (ref 39.0–52.0)
Hemoglobin: 18.7 g/dL — ABNORMAL HIGH (ref 13.0–17.0)
O2 Saturation: 96 %
Patient temperature: 96.3
Potassium: 3.8 mmol/L (ref 3.5–5.1)
Sodium: 134 mmol/L — ABNORMAL LOW (ref 135–145)
TCO2: 49 mmol/L — ABNORMAL HIGH (ref 22–32)
pCO2 arterial: 49.7 mmHg — ABNORMAL HIGH (ref 32–48)
pH, Arterial: 7.585 — ABNORMAL HIGH (ref 7.35–7.45)
pO2, Arterial: 69 mmHg — ABNORMAL LOW (ref 83–108)

## 2023-08-13 LAB — PROTIME-INR
INR: 1.3 — ABNORMAL HIGH (ref 0.8–1.2)
Prothrombin Time: 16.5 s — ABNORMAL HIGH (ref 11.4–15.2)

## 2023-08-13 LAB — MRSA NEXT GEN BY PCR, NASAL: MRSA by PCR Next Gen: NOT DETECTED

## 2023-08-13 LAB — LACTIC ACID, PLASMA
Lactic Acid, Venous: 5.2 mmol/L (ref 0.5–1.9)
Lactic Acid, Venous: 2.1 mmol/L (ref 0.5–1.9)

## 2023-08-13 LAB — HEPARIN LEVEL (UNFRACTIONATED): Heparin Unfractionated: 0.77 [IU]/mL — ABNORMAL HIGH (ref 0.30–0.70)

## 2023-08-13 LAB — MAGNESIUM: Magnesium: 2.2 mg/dL (ref 1.7–2.4)

## 2023-08-13 MED ORDER — AMIODARONE HCL IN DEXTROSE 360-4.14 MG/200ML-% IV SOLN
60.0000 mg/h | INTRAVENOUS | Status: DC
  Administered 2023-08-13 – 2023-08-14 (×2): 60 mg/h via INTRAVENOUS
  Filled 2023-08-13: qty 200

## 2023-08-13 MED ORDER — POLYETHYLENE GLYCOL 3350 17 G PO PACK
17.0000 g | PACK | Freq: Every day | ORAL | Status: DC
  Administered 2023-08-13 – 2023-08-15 (×3): 17 g
  Filled 2023-08-13 (×3): qty 1

## 2023-08-13 MED ORDER — FENTANYL BOLUS VIA INFUSION
25.0000 ug | INTRAVENOUS | Status: DC | PRN
  Administered 2023-08-14 – 2023-08-15 (×6): 50 ug via INTRAVENOUS
  Administered 2023-08-15: 100 ug via INTRAVENOUS

## 2023-08-13 MED ORDER — INSULIN ASPART 100 UNIT/ML IJ SOLN
0.0000 [IU] | INTRAMUSCULAR | Status: DC
  Administered 2023-08-13: 2 [IU] via SUBCUTANEOUS

## 2023-08-13 MED ORDER — BUDESONIDE 0.5 MG/2ML IN SUSP
0.5000 mg | Freq: Two times a day (BID) | RESPIRATORY_TRACT | Status: DC
  Administered 2023-08-13 – 2023-08-21 (×15): 0.5 mg via RESPIRATORY_TRACT
  Filled 2023-08-13 (×15): qty 2

## 2023-08-13 MED ORDER — SODIUM CHLORIDE 0.9 % IV SOLN
2.0000 g | Freq: Two times a day (BID) | INTRAVENOUS | Status: DC
  Administered 2023-08-14: 2 g via INTRAVENOUS

## 2023-08-13 MED ORDER — NOREPINEPHRINE 4 MG/250ML-% IV SOLN
2.0000 ug/min | INTRAVENOUS | Status: DC
  Administered 2023-08-13: 8 ug/min via INTRAVENOUS
  Filled 2023-08-13: qty 250

## 2023-08-13 MED ORDER — FENTANYL CITRATE PF 50 MCG/ML IJ SOSY
25.0000 ug | PREFILLED_SYRINGE | Freq: Once | INTRAMUSCULAR | Status: DC
  Filled 2023-08-13: qty 1

## 2023-08-13 MED ORDER — NICOTINE 14 MG/24HR TD PT24
14.0000 mg | MEDICATED_PATCH | Freq: Every day | TRANSDERMAL | Status: DC
  Administered 2023-08-14 – 2023-08-19 (×5): 14 mg via TRANSDERMAL
  Filled 2023-08-13 (×7): qty 1

## 2023-08-13 MED ORDER — DOCUSATE SODIUM 100 MG PO CAPS: 100.0000 mg | ORAL_CAPSULE | Freq: Two times a day (BID) | ORAL | Status: DC | PRN

## 2023-08-13 MED ORDER — IPRATROPIUM-ALBUTEROL 0.5-2.5 (3) MG/3ML IN SOLN
3.0000 mL | RESPIRATORY_TRACT | Status: DC | PRN
  Administered 2023-08-13 – 2023-08-19 (×5): 3 mL via RESPIRATORY_TRACT
  Filled 2023-08-13 (×4): qty 3

## 2023-08-13 MED ORDER — FENTANYL CITRATE PF 50 MCG/ML IJ SOSY: 25.0000 ug | PREFILLED_SYRINGE | INTRAMUSCULAR | Status: DC | PRN

## 2023-08-13 MED ORDER — METHYLPREDNISOLONE SODIUM SUCC 40 MG IJ SOLR
40.0000 mg | Freq: Two times a day (BID) | INTRAMUSCULAR | Status: DC
  Administered 2023-08-13 – 2023-08-15 (×5): 40 mg via INTRAVENOUS
  Filled 2023-08-13 (×5): qty 1

## 2023-08-13 MED ORDER — ORAL CARE MOUTH RINSE
15.0000 mL | OROMUCOSAL | Status: DC
  Administered 2023-08-14 – 2023-08-15 (×20): 15 mL via OROMUCOSAL

## 2023-08-13 MED ORDER — PANTOPRAZOLE SODIUM 40 MG IV SOLR
40.0000 mg | Freq: Every day | INTRAVENOUS | Status: DC
  Administered 2023-08-13 – 2023-08-15 (×3): 40 mg via INTRAVENOUS
  Filled 2023-08-13 (×3): qty 10

## 2023-08-13 MED ORDER — AMIODARONE LOAD VIA INFUSION
150.0000 mg | Freq: Once | INTRAVENOUS | Status: AC
  Administered 2023-08-13: 150 mg via INTRAVENOUS
  Filled 2023-08-13: qty 83.34

## 2023-08-13 MED ORDER — ACETAMINOPHEN 325 MG PO TABS: 650.0000 mg | ORAL_TABLET | ORAL | Status: DC | PRN

## 2023-08-13 MED ORDER — FUROSEMIDE 10 MG/ML IJ SOLN
40.0000 mg | Freq: Once | INTRAMUSCULAR | Status: DC
  Filled 2023-08-13: qty 4

## 2023-08-13 MED ORDER — DOCUSATE SODIUM 50 MG/5ML PO LIQD
100.0000 mg | Freq: Two times a day (BID) | ORAL | Status: DC
  Administered 2023-08-13 – 2023-08-15 (×4): 100 mg
  Filled 2023-08-13 (×4): qty 10

## 2023-08-13 MED ORDER — POLYETHYLENE GLYCOL 3350 17 G PO PACK: 17.0000 g | PACK | Freq: Every day | ORAL | Status: DC | PRN

## 2023-08-13 MED ORDER — SODIUM CHLORIDE 0.9 % IV SOLN
2.0000 g | Freq: Once | INTRAVENOUS | Status: DC
  Filled 2023-08-13: qty 12.5

## 2023-08-13 MED ORDER — INSULIN ASPART 100 UNIT/ML IJ SOLN
0.0000 [IU] | INTRAMUSCULAR | Status: DC
  Administered 2023-08-13: 11 [IU] via SUBCUTANEOUS
  Administered 2023-08-14: 8 [IU] via SUBCUTANEOUS
  Administered 2023-08-14: 5 [IU] via SUBCUTANEOUS
  Administered 2023-08-14 (×2): 3 [IU] via SUBCUTANEOUS
  Administered 2023-08-14: 5 [IU] via SUBCUTANEOUS
  Administered 2023-08-14: 15 [IU] via SUBCUTANEOUS
  Administered 2023-08-15 (×3): 5 [IU] via SUBCUTANEOUS
  Administered 2023-08-15: 8 [IU] via SUBCUTANEOUS
  Administered 2023-08-15: 5 [IU] via SUBCUTANEOUS
  Administered 2023-08-16: 2 [IU] via SUBCUTANEOUS
  Administered 2023-08-16 (×3): 3 [IU] via SUBCUTANEOUS
  Administered 2023-08-16: 5 [IU] via SUBCUTANEOUS
  Administered 2023-08-16 – 2023-08-17 (×3): 2 [IU] via SUBCUTANEOUS

## 2023-08-13 MED ORDER — DEXMEDETOMIDINE HCL IN NACL 400 MCG/100ML IV SOLN
0.0000 ug/kg/h | INTRAVENOUS | Status: DC
  Administered 2023-08-14 – 2023-08-15 (×2): 0.4 ug/kg/h via INTRAVENOUS
  Administered 2023-08-15: 0.5 ug/kg/h via INTRAVENOUS
  Filled 2023-08-13 (×3): qty 100

## 2023-08-13 MED ORDER — SODIUM CHLORIDE 0.9 % IV SOLN
100.0000 mg | Freq: Two times a day (BID) | INTRAVENOUS | Status: DC
  Administered 2023-08-13 – 2023-08-15 (×5): 100 mg via INTRAVENOUS
  Filled 2023-08-13 (×6): qty 100

## 2023-08-13 MED ORDER — FENTANYL 2500MCG IN NS 250ML (10MCG/ML) PREMIX INFUSION
25.0000 ug/h | INTRAVENOUS | Status: DC
  Administered 2023-08-13: 25 ug/h via INTRAVENOUS
  Filled 2023-08-13: qty 250

## 2023-08-13 MED ORDER — ATORVASTATIN CALCIUM 10 MG PO TABS
20.0000 mg | ORAL_TABLET | Freq: Every day | ORAL | Status: DC
  Administered 2023-08-14 – 2023-08-15 (×2): 20 mg
  Filled 2023-08-13 (×2): qty 2

## 2023-08-13 MED ORDER — SODIUM CHLORIDE 0.9 % IV SOLN
250.0000 mL | INTRAVENOUS | Status: AC
  Administered 2023-08-13: 250 mL via INTRAVENOUS

## 2023-08-13 MED ORDER — APIXABAN 5 MG PO TABS
5.0000 mg | ORAL_TABLET | Freq: Two times a day (BID) | ORAL | Status: DC
  Administered 2023-08-13 – 2023-08-15 (×4): 5 mg
  Filled 2023-08-13 (×4): qty 1

## 2023-08-13 MED ORDER — AMIODARONE HCL IN DEXTROSE 360-4.14 MG/200ML-% IV SOLN
30.0000 mg/h | INTRAVENOUS | Status: DC
  Administered 2023-08-14 – 2023-08-15 (×4): 30 mg/h via INTRAVENOUS
  Filled 2023-08-13 (×5): qty 200

## 2023-08-13 MED ORDER — ORAL CARE MOUTH RINSE: 15.0000 mL | OROMUCOSAL | Status: DC | PRN

## 2023-08-13 NOTE — Progress Notes (Addendum)
 eLink Physician-Brief Progress Note Patient Name: Brian Parsons DOB: 1951-03-19 MRN: [id number]   Date of Service  08/13/2023  HPI/Events of Note  eICU Brief new admit note: From OSH.  73 year old male with pertinent PMH COPD on 3L [place], chronic HFpEF, HTN, T2DM, aflutter on eliquis , CAD presents to [facility] on 4/28 with COPD exacerbation . Failed BiPAP trial. On vent.    Data: COVID/flu/RSV negative. LA 1.5. CXR showing pulmonary edema and small right pleural effusion.  7.44/111 from 15:39  Camera: Discussed with RN.  No intervention eeded. MAP 72, sats 95%. HR 87. On sedation, in synch with lung protective ventilation.      eICU Interventions   Continue current care plan Labs yet to send. Follow labs On eliquis . On SSI VAP bundle.    Rexann Catalan, MD, FCCP     Intervention Category Major Interventions: Respiratory failure - evaluation and management Evaluation Type: New Patient Evaluation  Rexann Catalan 08/13/2023, 8:15 PM  21:26 afib rate up to 156 now  BP 99/9 (87)   on vent with fent/levo     h/o aflutter- currently already on  ongoing eliquis . Elevated LA  Camera: Discussed with RN.  Plan: Amio bolus and infusion for rate control for a flutter-fib. Has NG tube- on eliquis  Urinary retension > 600. Foley ordered Secondary polycythemia-Hg > 18.  KUB: NG in place. CxR ET in place. ABG : metabolic alkalosis with pco2-down to 49. Wean down rate, Vt as tolerated by RT.   Follow BMP. Follow LA.

## 2023-08-13 NOTE — H&P (Addendum)
 NAME:  Brian Parsons, MRN:  [id number], DOB:  01-02-1951, LOS: 0 ADMISSION DATE:  08/13/2023, CONSULTATION DATE:  4/28 REFERRING MD:  [facility], CHIEF COMPLAINT:  acute respiratory failure w/ hypoxia send   History of Present Illness:  Patient is a 73 year old male with pertinent PMH COPD on 3L [place], chronic HFpEF, HTN, T2DM, aflutter on eliquis , CAD presents to [facility] on 4/28 with COPD exacerbation.  Patient recently admitted to [facility] 4/23 with COPD exacerbation and given antibiotics for possible CAP.  Discharged on 4/26 with Doxy and prednisone .  Patient was having some worsening SOB at 6 PM on 4/27.  EMS called on 4/28, patient sats 80% on room air and labored breathing.  EMS placed patient on NRB and transferred to [facility].  On arrival patient afebrile and BP stable.  AO x 2.  ABG 7-6 and PCO2 144.  Given IV steroids and placed on BiPAP for work of breathing.  No improvement in ABG and patient required intubation.  Patient became hypotensive and required iv fluid and pressors.  Cultures obtained and placed on vanc/cefepime.  COVID/flu/RSV negative.  LA 1.5.  CXR showing pulmonary edema and small right pleural effusion.  Pertinent ED labs: Troponin 135 then 114, BNP 3211, creat 2.72, BUN 116, glucose 147, AG 24  Pertinent  Medical History   Past Medical History:  Diagnosis Date   Bilateral leg edema    CHF (congestive heart failure) (HCC)    COPD (chronic obstructive pulmonary disease) (HCC)    Coronary atherosclerosis    Nonobstructive at cardiac catheterization April 2017   Diabetes mellitus without complication (HCC)    Essential hypertension    On home O2    2L N/C prn   Polycythemia      Significant Hospital Events: Including procedures, antibiotic start and stop dates in addition to other pertinent events   4/28 admitted to [facility] with acute respiratory failure intubated  Interim History / Subjective:  See above  Objective   Blood pressure 94/81,  temperature (!) 96.3 F (35.7 C), temperature source Axillary, weight 86.3 kg, SpO2 99%.    Vent Mode: PRVC FiO2 (%):  [40 %] 40 % Set Rate:  [18 bmp] 18 bmp Vt Set:  [520 mL] 520 mL PEEP:  [5 cmH20] 5 cmH20 Plateau Pressure:  [20 cmH20] 20 cmH20  No intake or output data in the 24 hours ending 08/13/23 1921 Filed Weights   08/13/23 1905  Weight: 86.3 kg    Examination: General:  critically ill appearing on mech vent HEENT: MM pink/moist; ETT in place Neuro: sedate; right pupil dilated appears surgical; left pupil 3mm sluggish CV: s1s2, RRR, no m/r/g PULM:  dim BS bilaterally; on mech vent PRVC GI: soft, bsx4 active  Extremities: cool/dry, no edema Skin: no rashes or lesions   Resolved Hospital Problem list     Assessment & Plan:  Acute on chronic respiratory failure w/ hypoxia/hypercapnia: on 3L  at home COPD vs. CHF exacerbation  -recently admitted to [place] and treated for copd exacerbation and cap Plan: -LTVV strategy with tidal volumes of 6-8 cc/kg ideal body weight -check ABG and adjust settings accordingly -Goal plateau pressures less than 30 and driving pressures less than 15 -Wean PEEP/FiO2 for SpO2 >90% -VAP bundle in place -Daily SAT and SBT -PAD protocol in place -wean sedation for RASS goal 0 to -1 -duoneb and pulmicort  scheduled -trach aspirate, rvp, mrsa pcr, legionella/strep -cefepime and vanc for possible hcap; doxy for atypical coverage -iv  steroids -consider lasix ; will hold for now with bump in creat  Hypotension: likely sedation related Plan: -levo for map goal >65 -wean sedation for rass 0 to -1 -follow cultures from [facility] -abx as above  Elevated Troponin and bnp Plan: -likely demand -bnp >3,000; last echo 4/26 lvef 40-45%; moderate pericardial effusion -EKG -trend troponin  AKI on CKD 3b Plan: -Trend BMP / urinary output -Replace electrolytes as indicated -Avoid nephrotoxic agents, ensure adequate renal  perfusion  T2DM Plan: -last A1c 4/24 was 6 -ssi and cbg monitoring  chronic HFpEF HTN CAD Plan: -statin -hold anti-htn meds -iv lasix   Recent atrial flutter: started on eliquis  by cardiology Plan: -tele monitoring -resume home eliquis  -trend electrolytes and replete as needed  Tobacco abuse Plan: -nicotine  patch   Best Practice (right click and "Reselect all SmartList Selections" daily)   Diet/type: NPO w/ meds via tube DVT prophylaxis DOAC Pressure ulcer(s): N/A GI prophylaxis: PPI Lines: N/A Foley:  N/A Code Status:  full code Last date of multidisciplinary goals of care discussion [Last admission at [facility] patient was DNR/DNI. 4/28 Son Juden Pontarelli updated over phone. States he would like to keep him full code for now.]  Labs   CBC: Recent Labs  Lab 08/08/23 1258 08/09/23 0456  WBC 11.5* 7.4  NEUTROABS 8.6*  --   HGB 17.3* 16.4  HCT 56.2* 51.9  MCV 96.9 93.9  PLT 198 187    Basic Metabolic Panel: Recent Labs  Lab 08/08/23 1258 08/09/23 0456 08/10/23 0444  NA 134* 134* 135  K 3.7 3.9 3.5  CL 68* 71* 74*  CO2 >45* >45* >45*  GLUCOSE 117* 246* 202*  BUN 76* 69* 75*  CREATININE 1.48* 1.48* 1.48*  CALCIUM  9.5 9.3 9.0  MG  --   --  2.6*   GFR: Estimated Creatinine Clearance: 46 mL/min (A) (by C-G formula based on SCr of 1.48 mg/dL (H)). Recent Labs  Lab 08/08/23 1258 08/09/23 0456  WBC 11.5* 7.4    Liver Function Tests: Recent Labs  Lab 08/08/23 1258  AST 19  ALT 15  ALKPHOS 86  BILITOT 1.3*  PROT 7.1  ALBUMIN 3.4*   No results for input(s): "LIPASE", "AMYLASE" in the last 168 hours. No results for input(s): "AMMONIA" in the last 168 hours.  ABG    Component Value Date/Time   PHART 7.430 03/24/2022 1026   PCO2ART 67.3 (HH) 03/24/2022 1026   PO2ART 54 (L) 03/24/2022 1026   HCO3 76.1 (H) 08/08/2023 1539   TCO2 >50 (H) 03/24/2022 1034   O2SAT 33.6 08/08/2023 1539     Coagulation Profile: No results for input(s): "INR",  "PROTIME" in the last 168 hours.  Cardiac Enzymes: No results for input(s): "CKTOTAL", "CKMB", "CKMBINDEX", "TROPONINI" in the last 168 hours.  HbA1C: Hemoglobin A1C  Date/Time Value Ref Range Status  12/27/2021 09:32 AM 5.5 4.0 - 5.6 % Final  11/25/2018 12:00 AM 8.0  Final   HbA1c, POC (controlled diabetic range)  Date/Time Value Ref Range Status  05/18/2021 09:14 AM 5.4 0.0 - 7.0 % Final  05/17/2020 08:48 AM 5.7 0.0 - 7.0 % Final   Hgb A1c MFr Bld  Date/Time Value Ref Range Status  08/09/2023 04:56 AM 6.0 (H) 4.8 - 5.6 % Final    Comment:    (NOTE) Pre diabetes:          5.7%-6.4%  Diabetes:              >6.4%  Glycemic control for   <  7.0% adults with diabetes     CBG: Recent Labs  Lab 08/10/23 1631 08/10/23 2051 08/11/23 0256 08/11/23 0742 08/13/23 1848  GLUCAP 306* 251* 223* 208* 204*    Review of Systems:   Patient is sedated and/or intubated; therefore, history has been obtained from chart review.    Past Medical History:  He,  has a past medical history of Bilateral leg edema, CHF (congestive heart failure) (HCC), COPD (chronic obstructive pulmonary disease) (HCC), Coronary atherosclerosis, Diabetes mellitus without complication (HCC), Essential hypertension, On home O2, and Polycythemia.   Surgical History:   Past Surgical History:  Procedure Laterality Date   CARDIAC CATHETERIZATION N/A 08/10/2015   Procedure: Left Heart Cath and Coronary Angiography;  Surgeon: Millicent Ally, MD;  Location: [facility] INVASIVE CV LAB;  Service: Cardiovascular;  Laterality: N/A;   RIGHT/LEFT HEART CATH AND CORONARY ANGIOGRAPHY N/A 03/24/2022   Procedure: RIGHT/LEFT HEART CATH AND CORONARY ANGIOGRAPHY;  Surgeon: Mardell Shade, MD;  Location: [facility] INVASIVE CV LAB;  Service: Cardiovascular;  Laterality: N/A;     Social History:   reports that he has been smoking cigarettes. He started smoking about 62 years ago. He has a 15.6 pack-year smoking history. He has never used  smokeless tobacco. He reports that he does not drink alcohol and does not use drugs.   Family History:  His family history includes Emphysema in his father; Stroke in his mother.   Allergies Allergies  Allergen Reactions   Citalopram Other (See Comments)    GI bleed   Bupropion Other (See Comments)    Unknown   Escitalopram  Other (See Comments)    Unknown   Penicillins Hives   Trazodone Hcl Other (See Comments)    Unknown   Varenicline Tartrate Other (See Comments)    Bad dreams   Amoxicillin Hives and Rash   Penicillin G Rash     Home Medications  Prior to Admission medications   Medication Sig Start Date End Date Taking? Authorizing Provider  albuterol  (ACCUNEB ) 0.63 MG/3ML nebulizer solution Take 3 mLs (0.63 mg total) by nebulization every 4 (four) hours as needed. 10/14/19   Diamond Formica, MD  albuterol  (VENTOLIN  HFA) 108 (90 Base) MCG/ACT inhaler Inhale 2 puffs into the lungs every 6 (six) hours as needed for wheezing or shortness of breath. 05/17/22   Diamond Formica, MD  allopurinol  (ZYLOPRIM ) 100 MG tablet Take 200 mg by mouth 2 (two) times daily.  08/06/17   [provider]  apixaban  (ELIQUIS ) 5 MG TABS tablet Take 1 tablet (5 mg total) by mouth 2 (two) times daily. 08/11/23   Rayfield Cairo, MD  atorvastatin  (LIPITOR) 20 MG tablet Take 20 mg by mouth daily at 6 PM.  03/01/17 08/08/23  [provider]  azelastine  (ASTELIN ) 0.1 % nasal spray Place 1 spray into both nostrils 2 (two) times daily. 07/19/20   [provider]  bisoprolol  (ZEBETA ) 5 MG tablet Take 0.5 tablets (2.5 mg total) by mouth daily. 08/12/23   Rayfield Cairo, MD  Blood Glucose Monitoring Suppl (TRUE METRIX METER) DEVI See admin instructions. 07/07/19   [provider]  busPIRone  (BUSPAR ) 7.5 MG tablet Take 7.5 mg by mouth 2 (two) times daily. 04/25/23   [provider]  colchicine 0.6 MG tablet Take 0.6 mg by mouth as needed (gout). 06/25/23   [provider]  doxepin  (SINEQUAN ) 10 MG capsule Take 10 mg by mouth at bedtime. 12/06/22   [provider]  doxycycline  (VIBRA -TABS) 100  MG tablet Take 1 tablet (100 mg total) by mouth 2 (two) times daily for 7 days. 08/08/23 08/15/23  Mordecai Applebaum, MD  fluticasone  (FLONASE ) 50 MCG/ACT nasal spray Place 1 spray into both nostrils daily. 07/19/20   [provider]  Fluticasone -Umeclidin-Vilant (TRELEGY ELLIPTA ) 100-62.5-25 MCG/ACT AEPB One click each am 03/11/21   Diamond Formica, MD  ipratropium-albuterol  (DUONEB) 0.5-2.5 (3) MG/3ML SOLN Take 3 mLs by nebulization every 4 (four) hours as needed. 04/18/21   [provider]  LORazepam  (ATIVAN ) 0.5 MG tablet Take 0.5 mg by mouth daily as needed for anxiety. 06/29/23   [provider]  Magnesium  Gluconate 500 (27 Mg) MG TABS Take 500 mg by mouth in the morning and at bedtime.     [provider]  metolazone  (ZAROXOLYN ) 2.5 MG tablet Take 2.5 mg by mouth every other day.  10/01/18   [provider]  pantoprazole  (PROTONIX ) 40 MG tablet Take 1 tablet (40 mg total) by mouth daily for 14 days. 08/12/23 08/26/23  Johnson, Clanford L, MD  potassium chloride  SA (KLOR-CON  M) 20 MEQ tablet Take 20 mEq by mouth daily. 02/21/23   [provider]  predniSONE  (DELTASONE ) 20 MG tablet Take 2 tablets (40 mg total) by mouth daily with breakfast for 7 days. 08/11/23 08/18/23  Johnson, Clanford L, MD  ramelteon (ROZEREM) 8 MG tablet Take 8 mg by mouth at bedtime as needed for sleep. 07/24/23   [provider]  sildenafil (VIAGRA) 50 MG tablet Take 50 mg by mouth as needed for erectile dysfunction. 04/18/21   [provider]  torsemide  (DEMADEX ) 20 MG tablet Take 80 mg by mouth 2 (two) times daily.     [provider]  vitamin B-12 (CYANOCOBALAMIN ) 1000 MCG tablet Take 1,000 mcg by mouth daily.    [provider]  ZYRTEC ALLERGY 10 MG tablet Take 10 mg by mouth daily. 09/27/22    [provider]     Critical care time: 50 minutes      JD Vira Grieves Pulmonary & Critical Care 08/13/2023, 7:21 PM  Please see [email or web address] for pager details.  From 7A-7P if no response, please call [phone]. After hours, please call ELink [phone].

## 2023-08-14 DIAGNOSIS — J9622 Acute and chronic respiratory failure with hypercapnia: Secondary | ICD-10-CM | POA: Diagnosis not present

## 2023-08-14 DIAGNOSIS — A419 Sepsis, unspecified organism: Secondary | ICD-10-CM | POA: Diagnosis not present

## 2023-08-14 DIAGNOSIS — N1832 Chronic kidney disease, stage 3b: Secondary | ICD-10-CM | POA: Diagnosis not present

## 2023-08-14 DIAGNOSIS — J9621 Acute and chronic respiratory failure with hypoxia: Secondary | ICD-10-CM | POA: Diagnosis not present

## 2023-08-14 LAB — CBC
HCT: 33.5 % — ABNORMAL LOW (ref 39.0–52.0)
Hemoglobin: 10.1 g/dL — ABNORMAL LOW (ref 13.0–17.0)
MCH: 29.6 pg (ref 26.0–34.0)
MCHC: 30.1 g/dL (ref 30.0–36.0)
MCV: 98.2 fL (ref 80.0–100.0)
Platelets: 147 10*3/uL — ABNORMAL LOW (ref 150–400)
RBC: 3.41 MIL/uL — ABNORMAL LOW (ref 4.22–5.81)
RDW: 14.7 % (ref 11.5–15.5)
WBC: 9.3 10*3/uL (ref 4.0–10.5)
nRBC: 0 % (ref 0.0–0.2)

## 2023-08-14 LAB — BASIC METABOLIC PANEL WITH GFR
Anion gap: 19 — ABNORMAL HIGH (ref 5–15)
BUN: 121 mg/dL — ABNORMAL HIGH (ref 8–23)
CO2: 39 mmol/L — ABNORMAL HIGH (ref 22–32)
Calcium: 9.2 mg/dL (ref 8.9–10.3)
Chloride: 80 mmol/L — ABNORMAL LOW (ref 98–111)
Creatinine, Ser: 2.41 mg/dL — ABNORMAL HIGH (ref 0.61–1.24)
GFR, Estimated: 27 mL/min — ABNORMAL LOW (ref >60)
Glucose, Bld: 322 mg/dL — ABNORMAL HIGH (ref 70–99)
Potassium: 3.6 mmol/L (ref 3.5–5.1)
Sodium: 138 mmol/L (ref 135–145)

## 2023-08-14 LAB — GLUCOSE, CAPILLARY
Glucose-Capillary: 180 mg/dL — ABNORMAL HIGH (ref 70–99)
Glucose-Capillary: 191 mg/dL — ABNORMAL HIGH (ref 70–99)
Glucose-Capillary: 222 mg/dL — ABNORMAL HIGH (ref 70–99)
Glucose-Capillary: 229 mg/dL — ABNORMAL HIGH (ref 70–99)
Glucose-Capillary: 281 mg/dL — ABNORMAL HIGH (ref 70–99)
Glucose-Capillary: 366 mg/dL — ABNORMAL HIGH (ref 70–99)

## 2023-08-14 LAB — VANCOMYCIN, RANDOM: Vancomycin Rm: 21 ug/mL

## 2023-08-14 LAB — STREP PNEUMONIAE URINARY ANTIGEN: Strep Pneumo Urinary Antigen: NEGATIVE

## 2023-08-14 MED ORDER — CHLORHEXIDINE GLUCONATE CLOTH 2 % EX PADS
6.0000 | MEDICATED_PAD | Freq: Every day | CUTANEOUS | Status: DC
Start: 2023-08-14 — End: 2023-08-21
  Administered 2023-08-14 – 2023-08-20 (×5): 6 via TOPICAL

## 2023-08-14 MED ORDER — BUSPIRONE HCL 15 MG PO TABS
7.5000 mg | ORAL_TABLET | Freq: Two times a day (BID) | ORAL | Status: DC
  Administered 2023-08-14 – 2023-08-15 (×4): 7.5 mg
  Filled 2023-08-14 (×4): qty 1

## 2023-08-14 MED ORDER — SENNA 8.6 MG PO TABS
2.0000 | ORAL_TABLET | Freq: Every day | ORAL | Status: DC
  Administered 2023-08-14: 17.2 mg
  Filled 2023-08-14: qty 2

## 2023-08-14 MED ORDER — VANCOMYCIN VARIABLE DOSE PER UNSTABLE RENAL FUNCTION (PHARMACIST DOSING): Status: DC

## 2023-08-14 MED ORDER — SODIUM CHLORIDE 0.9 % IV SOLN
2.0000 g | INTRAVENOUS | Status: DC
  Administered 2023-08-14 – 2023-08-15 (×2): 2 g via INTRAVENOUS
  Filled 2023-08-14 (×2): qty 20

## 2023-08-14 MED ORDER — ADULT MULTIVITAMIN W/MINERALS CH
1.0000 | ORAL_TABLET | Freq: Every day | ORAL | Status: DC
  Administered 2023-08-14 – 2023-08-15 (×2): 1
  Filled 2023-08-14 (×2): qty 1

## 2023-08-14 MED ORDER — INSULIN GLARGINE-YFGN 100 UNIT/ML ~~LOC~~ SOLN
12.0000 [IU] | Freq: Every day | SUBCUTANEOUS | Status: DC
  Administered 2023-08-14 – 2023-08-15 (×2): 12 [IU] via SUBCUTANEOUS
  Filled 2023-08-14 (×5): qty 0.12

## 2023-08-14 MED ORDER — POTASSIUM CHLORIDE 20 MEQ PO PACK
20.0000 meq | PACK | Freq: Once | ORAL | Status: AC
  Administered 2023-08-14: 20 meq
  Filled 2023-08-14: qty 1

## 2023-08-14 MED ORDER — PROSOURCE TF20 ENFIT COMPATIBL EN LIQD
60.0000 mL | Freq: Every day | ENTERAL | Status: DC
  Administered 2023-08-14 – 2023-08-15 (×2): 60 mL
  Filled 2023-08-14 (×2): qty 60

## 2023-08-14 MED ORDER — OSMOLITE 1.5 CAL PO LIQD
1000.0000 mL | ORAL | Status: DC
  Administered 2023-08-14: 1000 mL
  Filled 2023-08-14 (×3): qty 1000

## 2023-08-14 NOTE — Progress Notes (Addendum)
 Pharmacy ICU Bowel Regimen Consult Note   Current Inpatient Medications for Bowel Management:  Miralax /docusate   Assessment: Brian Parsons is a 73 y.o. year old male admitted on 08/13/2023. Constipation identified as acute opioid-induced constipation . Bowel regimen assessment completed by Brian Lota, RN on 4/29 (date). Last bowel movement prior to admission. Tube feeds to start today.   [x]  Bowel sounds present  [x]  No abdominal tenderness  []  Passing gas   Plan: Start senna 2 tabs QHS MD contacted (if needed): n/a   Thank you for allowing pharmacy to participate in this patient's care.  Brian Parsons 08/14/2023,2:48 PM

## 2023-08-14 NOTE — Progress Notes (Signed)
 Initial Nutrition Assessment  DOCUMENTATION CODES:  Non-severe (moderate) malnutrition in context of chronic illness  INTERVENTION:  Initiate tube feeding via OGT: Osmolite 1.5 at 50 ml/h (1200 ml per day) Start at 20 and increase by 10mL q8h to goal Prosource TF20 60 ml 1x/d Provides 1880 kcal, 95 gm protein, 914 ml free water daily MVI with minerals daily  NUTRITION DIAGNOSIS:   Moderate Malnutrition related to chronic illness (COPD) as evidenced by mild fat depletion, mild muscle depletion.  GOAL:   Patient will meet greater than or equal to 90% of their needs  MONITOR:   TF tolerance, I & O's, Vent status, Labs  REASON FOR ASSESSMENT:  Ventilator    ASSESSMENT:  Pt with hx of COPD on home O2, CHF, DMT2, CAD, and HTN presented to outside hospital ED with acute respiratory failure in the setting of a COPD exacerbation. Transferred to [facility] after intubation.  4/28 - presented to hospital in [place], intubated, transferred to [facility]   Patient is currently intubated on ventilator support. No family present at the time of assessment to provide a nutrition hx. RRT in room at the time of assessment just adjusted pt back to full vent support as he was uncomfortable. Discussed in rounds, likely will be in ICU for a few more days. Ok to start enteral nutrition.   On exam/ pt with some muscle and fat loss throughout body which is consistent with inadequate nutrition. Noted that pt had reported recent poor intake and weight loss on nursing screen at [facility] admission earlier this month. Weight overall seems stable x 1 year. Some edema present on exam.  MV: 9.6 L/min Temp (24hrs), Avg:98.3 F (36.8 C), Min:95.7 F (35.4 C), Max:99.3 F (37.4 C) MAP (cuff):  Admit weight: 86.3 kg  Current weight: 89.1 kg   Intake/Output Summary (Last 24 hours) at 08/14/2023 1331 Last data filed at 08/14/2023 1200 Gross per 24 hour  Intake 1437.39 ml  Output 1260 ml  Net  177.39 ml  Net IO Since Admission: 177.39 mL [08/14/23 1331]  Drains/Lines: OGT (gastric fundus) UOP out   Nutritionally Relevant Medications: Scheduled Meds:  atorvastatin   20 mg Per Tube q1800   docusate  100 mg Per Tube BID   insulin  aspart  0-15 Units Subcutaneous Q4H   methylPREDNISolone   40 mg Intravenous Q12H   pantoprazole  IV  40 mg Intravenous Daily   polyethylene glycol  17 g Per Tube Daily   Continuous Infusions:  ceFEPime (MAXIPIME) IV Stopped (08/14/23 0317)   doxycycline  (VIBRAMYCIN ) IV 125 mL/hr at 08/14/23 0900   PRN Meds: docusate sodium , polyethylene glycol  Labs Reviewed: BUN 121, creatinine 2.41 CBG ranges from 201-366 mg/dL over the last 24 hours HgbA1c 6.0%  NUTRITION - FOCUSED PHYSICAL EXAM: Flowsheet Row Most Recent Value  Orbital Region Severe depletion  Upper Arm Region Mild depletion  Thoracic and Lumbar Region No depletion  Buccal Region Unable to assess  Temple Region Mild depletion  Clavicle Bone Region Mild depletion  Clavicle and Acromion Bone Region Moderate depletion  Scapular Bone Region Moderate depletion  Dorsal Hand Unable to assess  Anterior Thigh Region No depletion  Posterior Calf Region No depletion  Edema (RD Assessment) Mild  [BLE]  Hair Reviewed  Eyes Reviewed  Mouth Reviewed  Skin Reviewed  [red, dry]  Nails Unable to assess   Diet Order:   Diet Order             Diet NPO time specified  Except for: Other (See Comments)  Diet effective now                  EDUCATION NEEDS:  Not appropriate for education at this time  Skin:  Skin Assessment: Skin Integrity Issues: Stage 2: - Coccyx  Last BM:  unsure  Height:  Ht Readings from Last 1 Encounters:  08/08/23 5\' 7"  (1.702 m)    Weight:  Wt Readings from Last 1 Encounters:  08/14/23 89.1 kg    Ideal Body Weight:  67.3 kg  BMI:  Body mass index is 30.77 kg/m.  Estimated Nutritional Needs:  Kcal:  1800-2000 kcal/d Protein:  90-110g/d Fluid:   1.8-2L/d    Edwena Graham, RD, LDN Registered Dietitian II Please reach out via secure chat

## 2023-08-14 NOTE — Progress Notes (Signed)
 40 mls Fentanyl  and 50 mls of versed   from previous hospital (not [facility] meds)wasted in stericycle and witnessed by Roxanna Coppersmith RN

## 2023-08-14 NOTE — Progress Notes (Signed)
 Pharmacy Electrolyte Replacement  Recent Labs:  Recent Labs    08/13/23 2017 08/13/23 2024 08/14/23 0555  K 4.7   < > 3.6  MG 2.2  --   --   CREATININE 2.63*  --  2.41*   < > = values in this interval not displayed.    Low Critical Values (K </= 2.5, Phos </= 1, Mg </= 1) Present: None  MD Contacted: n/a  Plan: Kcl 20 mEq per tube x1    Valarie Garner, PharmD PGY1 Pharmacy Resident  Please check AMION for all [facility] Pharmacy phone numbers After 10:00 PM, call Main Pharmacy [phone]

## 2023-08-14 NOTE — TOC CM/SW Note (Signed)
 Transition of Care [facility]) - Inpatient Brief Assessment   Patient Details  Name: Brian Parsons MRN: [id number] Date of Birth: 01-Sep-1950  [facility] CM/SW Contact:    Juliane Och, LCSW Phone Number: 08/14/2023, 2:01 PM   Clinical Narrative:  2:01 PM Per chart review, patient is currently intubated. CSW attempted to call patient's primary contact/son, Merlyn Starring, to conduct TOC initial assessment. There was no response and a voicemail was left.  Transition of Care Asessment: Insurance and Status: Insurance coverage has been reviewed Patient has primary care physician: Yes Home environment has been reviewed: Private Residence Prior level of function:: N/A Prior/Current Home Services: No current home services Social Drivers of Health Review: SDOH reviewed interventions complete (Need updated SDOH for current hospitalization) Readmission risk has been reviewed: Yes Transition of care needs: no transition of care needs at this time

## 2023-08-14 NOTE — Progress Notes (Signed)
 Pharmacy Antibiotic Note  Brian Parsons is a 73 y.o. male admitted on 08/13/2023 with pneumonia and bacteremia.  Pharmacy has been consulted for vancomycin dosing.  Patient received dose of vancomycin at outside hospital. Unsure of timing or dose. Random vancomycin level today resulted at 21 which is therapeutic. Patient currently has an AKI, Scr 2.41 (B 1.5). Afebrile, LA 2.1, WBC 9.3 (on steroids).   Plan: Check random vancomycin level in am Continue doxycycline  and ceftriaxone  F/u Bcx from [facility] to determine if GPC 1/4 is contaminant  Weight: 89.1 kg (196 lb 6.9 oz)  Temp (24hrs), Avg:98.2 F (36.8 C), Min:95.7 F (35.4 C), Max:99.3 F (37.4 C)  Recent Labs  Lab 08/08/23 1258 08/09/23 0456 08/10/23 0444 08/13/23 2017 08/13/23 2214 08/14/23 0311 08/14/23 0555 08/14/23 1304  WBC 11.5* 7.4  --  15.3*  --  9.3  --   --   CREATININE 1.48* 1.48* 1.48* 2.63*  --   --  2.41*  --   LATICACIDVEN  --   --   --  5.2* 2.1*  --   --   --   VANCORANDOM  --   --   --   --   --   --   --  21    Estimated Creatinine Clearance: 28.6 mL/min (A) (by C-G formula based on SCr of 2.41 mg/dL (H)).    Allergies  Allergen Reactions   Citalopram Other (See Comments)    GI bleed   Bupropion Other (See Comments)    Unknown   Escitalopram  Other (See Comments)    Unknown   Penicillins Hives   Trazodone Hcl Other (See Comments)    Unknown   Varenicline Tartrate Other (See Comments)    Bad dreams   Amoxicillin Hives and Rash   Penicillin G Rash    Antimicrobials this admission: Cefepime 4/28 >> 4/29 Doxycycline  4/28 >>  Ceftriaxone  4/29 >>  Microbiology results: 4/28 Bcx from [place] (not in Epic): GPC in 1/4 bottles 4/28 Sputum: rare GPC in pair, rare GNR  4/28 MRSA PCR: negative  Thank you for allowing pharmacy to be a part of this patient's care.  Lewayne Records, PharmD PGY1 Pharmacy Resident 08/14/2023 2:47 PM

## 2023-08-14 NOTE — Progress Notes (Signed)
 NAME:  Brian Parsons, MRN:  [id number], DOB:  11-Nov-1950, LOS: 1 ADMISSION DATE:  08/13/2023, CONSULTATION DATE:  4/28 REFERRING MD:  [facility], CHIEF COMPLAINT:  acute respiratory failure w/ hypoxia send   History of Present Illness:  Patient is a 73 year old male with pertinent PMH COPD on 3L [place], chronic HFpEF, HTN, T2DM, aflutter on eliquis , CAD presents to [facility] on 4/28 with COPD exacerbation.  Patient recently admitted to [facility] 4/23 with COPD exacerbation and given antibiotics for possible CAP.  Discharged on 4/26 with Doxy and prednisone .  Patient was having some worsening SOB at 6 PM on 4/27.  EMS called on 4/28, patient sats 80% on room air and labored breathing.  EMS placed patient on NRB and transferred to [facility]  On arrival patient afebrile and BP stable.  AO x 2.  ABG 7-6 and PCO2 144.  Given IV steroids and placed on BiPAP for work of breathing.  No improvement in ABG and patient required intubation.  Patient became hypotensive and required iv fluid and pressors.  Cultures obtained and placed on vanc/cefepime.  COVID/flu/RSV negative.  LA 1.5.  CXR showing pulmonary edema and small right pleural effusion.  Pertinent ED labs: Troponin 135 then 114, BNP 3211, creat 2.72, BUN 116, glucose 147, AG 24  Pertinent  Medical History   Past Medical History:  Diagnosis Date   Bilateral leg edema    CHF (congestive heart failure) (HCC)    COPD (chronic obstructive pulmonary disease) (HCC)    Coronary atherosclerosis    Nonobstructive at cardiac catheterization April 2017   Diabetes mellitus without complication (HCC)    Essential hypertension    On home O2    2L N/C prn   Polycythemia      Significant Hospital Events: Including procedures, antibiotic start and stop dates in addition to other pertinent events   4/28 admitted to [facility] with acute respiratory failure intubated  Interim History / Subjective:   No acute events.  Remains on the vent  Objective    Blood pressure (!) 122/95, pulse (!) 102, temperature 99.3 F (37.4 C), resp. rate (!) 23, weight 89.1 kg, SpO2 96%.    Vent Mode: PRVC FiO2 (%):  [40 %-50 %] 40 % Set Rate:  [18 bmp] 18 bmp Vt Set:  [520 mL] 520 mL PEEP:  [5 cmH20-8 cmH20] 8 cmH20 Plateau Pressure:  [20 cmH20-21 cmH20] 20 cmH20   Intake/Output Summary (Last 24 hours) at 08/14/2023 0744 Last data filed at 08/14/2023 0600 Gross per 24 hour  Intake 942.94 ml  Output 975 ml  Net -32.06 ml   Filed Weights   08/13/23 1905 08/14/23 0500  Weight: 86.3 kg 89.1 kg    Examination: Blood pressure (!) 122/95, pulse 65, temperature 99.3 F (37.4 C), resp. rate (!) 21, weight 89.1 kg, SpO2 98%. Gen:      No acute distress HEENT:  EOMI, sclera anicteric, ET tube Neck:     No masses; no thyromegaly Lungs:    Clear to auscultation bilaterally; normal respiratory effort CV:         Regular rate and rhythm; no murmurs Abd:      + bowel sounds; soft, non-tender; no palpable masses, no distension Ext:    No edema; adequate peripheral perfusion Skin:      Warm and dry; no rash Neuro: Sedated  Labs/imaging reviewed Significant for BUN/creatinine 121/2.41 Hemoglobin 10.1, hemoglobin 147  Resolved Hospital Problem list     Assessment & Plan:  Acute on chronic respiratory failure w/ hypoxia/hypercapnia: on 3L [place] at home COPD vs. CHF exacerbation  -recently admitted to [place] and treated for copd exacerbation and cap Plan: Continue low tidal volume ventilation Follow intermittent ABG, chest x-ray Continue DuoNeb, Pulmicort  IV Solu-Medrol  40 mg every 12 Continue Vanco for now as OSH blood cultures reported gram-positive cocci Can change cefepime to ceftriaxone , Doxy for CAP coverage  Sepsis present on admission Plan: Wean down pressors as tolerated Continue antibiotics  Elevated Troponin and bnp Plan: Follow troponins  AKI on CKD 3b Plan: Follow urine output and creatinine  T2DM Plan: -last A1c 4/24 was  6 -ssi and cbg monitoring  chronic HFpEF HTN CAD Plan: -statin -hold anti-htn meds -iv lasix   Recent atrial flutter: started on eliquis  by cardiology Plan: -tele monitoring -resume home eliquis  -trend electrolytes and replete as needed  Tobacco abuse Plan: -nicotine  patch   Best Practice (right click and "Reselect all SmartList Selections" daily)   Diet/type: tubefeeds DVT prophylaxis DOAC Pressure ulcer(s): N/A GI prophylaxis: PPI Lines: N/A Foley:  N/A Code Status:  full code Last date of multidisciplinary goals of care discussion [Last admission at [facility] patient was DNR/DNI. 4/28 Son Manly Glenn updated over phone. States he would like to keep him full code for now.]  Critical care time:    Tashi Andujo MD [facility] See Amion for pager  If no response to pager , please call [phone] until 7pm After 7:00 pm call Elink  [phone] 08/14/2023, 1:30 PM

## 2023-08-15 DIAGNOSIS — J9621 Acute and chronic respiratory failure with hypoxia: Secondary | ICD-10-CM | POA: Diagnosis not present

## 2023-08-15 DIAGNOSIS — N1832 Chronic kidney disease, stage 3b: Secondary | ICD-10-CM | POA: Diagnosis not present

## 2023-08-15 DIAGNOSIS — E440 Moderate protein-calorie malnutrition: Secondary | ICD-10-CM

## 2023-08-15 DIAGNOSIS — A419 Sepsis, unspecified organism: Secondary | ICD-10-CM | POA: Diagnosis not present

## 2023-08-15 DIAGNOSIS — L8990 Pressure ulcer of unspecified site, unspecified stage: Secondary | ICD-10-CM | POA: Diagnosis present

## 2023-08-15 DIAGNOSIS — J9622 Acute and chronic respiratory failure with hypercapnia: Secondary | ICD-10-CM | POA: Diagnosis not present

## 2023-08-15 LAB — BASIC METABOLIC PANEL WITH GFR
Anion gap: 15 (ref 5–15)
BUN: 134 mg/dL — ABNORMAL HIGH (ref 8–23)
CO2: 39 mmol/L — ABNORMAL HIGH (ref 22–32)
Calcium: 9.1 mg/dL (ref 8.9–10.3)
Chloride: 85 mmol/L — ABNORMAL LOW (ref 98–111)
Creatinine, Ser: 2.2 mg/dL — ABNORMAL HIGH (ref 0.61–1.24)
GFR, Estimated: 31 mL/min — ABNORMAL LOW (ref >60)
Glucose, Bld: 214 mg/dL — ABNORMAL HIGH (ref 70–99)
Potassium: 3.9 mmol/L (ref 3.5–5.1)
Sodium: 139 mmol/L (ref 135–145)

## 2023-08-15 LAB — LEGIONELLA PNEUMOPHILA SEROGP 1 UR AG: L. pneumophila Serogp 1 Ur Ag: NEGATIVE

## 2023-08-15 LAB — MAGNESIUM: Magnesium: 2.3 mg/dL (ref 1.7–2.4)

## 2023-08-15 LAB — PHOSPHORUS: Phosphorus: 1.9 mg/dL — ABNORMAL LOW (ref 2.5–4.6)

## 2023-08-15 LAB — VANCOMYCIN, RANDOM: Vancomycin Rm: 17 ug/mL

## 2023-08-15 LAB — GLUCOSE, CAPILLARY
Glucose-Capillary: 227 mg/dL — ABNORMAL HIGH (ref 70–99)
Glucose-Capillary: 228 mg/dL — ABNORMAL HIGH (ref 70–99)
Glucose-Capillary: 233 mg/dL — ABNORMAL HIGH (ref 70–99)
Glucose-Capillary: 237 mg/dL — ABNORMAL HIGH (ref 70–99)
Glucose-Capillary: 241 mg/dL — ABNORMAL HIGH (ref 70–99)
Glucose-Capillary: 274 mg/dL — ABNORMAL HIGH (ref 70–99)

## 2023-08-15 LAB — TROPONIN I (HIGH SENSITIVITY): Troponin I (High Sensitivity): 40 ng/L — ABNORMAL HIGH (ref <18)

## 2023-08-15 LAB — CBC
HCT: 51.7 % (ref 39.0–52.0)
Hemoglobin: 16.5 g/dL (ref 13.0–17.0)
MCH: 29.4 pg (ref 26.0–34.0)
MCHC: 31.9 g/dL (ref 30.0–36.0)
MCV: 92.2 fL (ref 80.0–100.0)
Platelets: 162 10*3/uL (ref 150–400)
RBC: 5.61 MIL/uL (ref 4.22–5.81)
RDW: 14.7 % (ref 11.5–15.5)
WBC: 12.6 10*3/uL — ABNORMAL HIGH (ref 4.0–10.5)
nRBC: 0 % (ref 0.0–0.2)

## 2023-08-15 MED ORDER — POLYETHYLENE GLYCOL 3350 17 G PO PACK: 17.0000 g | PACK | Freq: Two times a day (BID) | ORAL | Status: DC

## 2023-08-15 MED ORDER — POLYETHYLENE GLYCOL 3350 17 G PO PACK
17.0000 g | PACK | Freq: Two times a day (BID) | ORAL | Status: DC
  Administered 2023-08-16 – 2023-08-19 (×5): 17 g via ORAL
  Filled 2023-08-15 (×8): qty 1

## 2023-08-15 MED ORDER — DOCUSATE SODIUM 50 MG/5ML PO LIQD
100.0000 mg | Freq: Two times a day (BID) | ORAL | Status: DC
  Administered 2023-08-15: 100 mg via ORAL
  Filled 2023-08-15: qty 10

## 2023-08-15 MED ORDER — ORAL CARE MOUTH RINSE
15.0000 mL | OROMUCOSAL | Status: DC
  Administered 2023-08-15 – 2023-08-21 (×11): 15 mL via OROMUCOSAL

## 2023-08-15 MED ORDER — ADULT MULTIVITAMIN W/MINERALS CH
1.0000 | ORAL_TABLET | Freq: Every day | ORAL | Status: DC
  Administered 2023-08-16 – 2023-08-21 (×6): 1 via ORAL
  Filled 2023-08-15 (×6): qty 1

## 2023-08-15 MED ORDER — ACETAMINOPHEN 325 MG PO TABS
650.0000 mg | ORAL_TABLET | ORAL | Status: DC | PRN
  Administered 2023-08-16: 650 mg via ORAL
  Filled 2023-08-15 (×2): qty 2

## 2023-08-15 MED ORDER — SENNA 8.6 MG PO TABS
2.0000 | ORAL_TABLET | Freq: Two times a day (BID) | ORAL | Status: DC
  Administered 2023-08-16 – 2023-08-19 (×5): 17.2 mg via ORAL
  Filled 2023-08-15 (×7): qty 2

## 2023-08-15 MED ORDER — FUROSEMIDE 10 MG/ML IJ SOLN
40.0000 mg | Freq: Once | INTRAMUSCULAR | Status: AC
  Administered 2023-08-15: 40 mg via INTRAVENOUS
  Filled 2023-08-15: qty 4

## 2023-08-15 MED ORDER — APIXABAN 5 MG PO TABS
5.0000 mg | ORAL_TABLET | Freq: Two times a day (BID) | ORAL | Status: DC
  Administered 2023-08-15 – 2023-08-21 (×12): 5 mg via ORAL
  Filled 2023-08-15 (×12): qty 1

## 2023-08-15 MED ORDER — POTASSIUM CHLORIDE 20 MEQ PO PACK
20.0000 meq | PACK | Freq: Once | ORAL | Status: AC
  Administered 2023-08-15: 20 meq
  Filled 2023-08-15: qty 1

## 2023-08-15 MED ORDER — ORAL CARE MOUTH RINSE: 15.0000 mL | OROMUCOSAL | Status: DC | PRN

## 2023-08-15 MED ORDER — SODIUM PHOSPHATES 45 MMOLE/15ML IV SOLN
15.0000 mmol | Freq: Once | INTRAVENOUS | Status: AC
  Administered 2023-08-15: 15 mmol via INTRAVENOUS
  Filled 2023-08-15: qty 5

## 2023-08-15 MED ORDER — FENTANYL CITRATE PF 50 MCG/ML IJ SOSY
25.0000 ug | PREFILLED_SYRINGE | INTRAMUSCULAR | Status: DC | PRN
  Administered 2023-08-15: 25 ug via INTRAVENOUS

## 2023-08-15 MED ORDER — SENNA 8.6 MG PO TABS: 2.0000 | ORAL_TABLET | Freq: Two times a day (BID) | ORAL | Status: DC

## 2023-08-15 MED ORDER — ATORVASTATIN CALCIUM 10 MG PO TABS
20.0000 mg | ORAL_TABLET | Freq: Every day | ORAL | Status: DC
  Administered 2023-08-16 – 2023-08-19 (×4): 20 mg via ORAL
  Filled 2023-08-15 (×5): qty 2

## 2023-08-15 NOTE — TOC Initial Note (Signed)
 Transition of Care [facility]) - Initial/Assessment Note    Patient Details  Name: Brian Parsons MRN: [id number] Date of Birth: December 02, 1950  Transition of Care [facility]) CM/SW Contact:    Juliane Och, LCSW Phone Number: 08/15/2023, 11:53 AM  Clinical Narrative:               11:53 AM CSW introduced self and role to patient's son, Merlyn Starring (patient is currently intubated). Merlyn Starring confirmed that patient resides at home alone. Merlyn Starring confirmed he or other family/friends could provide transportation and home support if needed upon discharge. Merlyn Starring confirmed SNF/HH/DME history. Merlyn Starring stated that patient has a walker at home and did not remember HH/SNF name's. Per chart review, patient has history with [facility] and [facility]. SNF history could not be obtained through patient's chart.      Expected Discharge Plan: Home/Self Care Barriers to Discharge: Continued Medical Work up   Patient Goals and CMS Choice            Expected Discharge Plan and Services       Living arrangements for the past 2 months: Single Family Home                                      Prior Living Arrangements/Services Living arrangements for the past 2 months: Single Family Home Lives with:: Self Patient language and need for interpreter reviewed:: Yes        Need for Family Participation in Patient Care: Yes (Comment) Care giver support system in place?: Yes (comment)   Criminal Activity/Legal Involvement Pertinent to Current Situation/Hospitalization: No - Comment as needed  Activities of Daily Living      Permission Sought/Granted Permission sought to share information with : Family Supports Permission granted to share information with : No (Contact information on chart)  Share Information with NAME: Canan Landgraf     Permission granted to share info w Relationship: Son  Permission granted to share info w Contact Information: [phone]  Emotional Assessment Appearance:: Appears stated  age Attitude/Demeanor/Rapport: Unable to Assess, Intubated (Following Commands or Not Following Commands) Affect (typically observed): Unable to Assess   Alcohol / Substance Use: Not Applicable Psych Involvement: No (comment)  Admission diagnosis:  Acute respiratory failure with hypoxia (HCC) [J96.01] Patient Active Problem List   Diagnosis Date Noted   Atrial flutter (HCC) 08/10/2023   Panic attacks 03/22/2023   Chronic right-sided heart failure (HCC) 03/23/2022   Atrial arrhythmia 03/23/2022   Abnormal ECG 03/23/2022   Peripheral edema 03/23/2022   Malnutrition of moderate degree 03/21/2022   Acute respiratory failure with hypoxia (HCC) 03/19/2022   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 / group D symptom/ risk 10/14/2019   Type 2 diabetes mellitus with stage 3a chronic kidney disease, without long-term current use of insulin  (HCC) 03/06/2019   Mixed hyperlipidemia 03/06/2019   Class 2 severe obesity due to excess calories with serious comorbidity and body mass index (BMI) of 36.0 to 36.9 in adult [facility]) 03/06/2019   Benign hypertensive kidney disease with chronic kidney disease 01/07/2019   Chronic kidney disease, stage III (moderate) (HCC) 01/07/2019   Edema 10/01/2017   CHF, acute on chronic (HCC) 04/29/2016   AKI (acute kidney injury) (HCC) 04/29/2016   CHF exacerbation (HCC) 04/29/2016  CHF (congestive heart failure) (HCC) 02/24/2016   Tobacco abuse 01/05/2016   Exertional dyspnea    Abnormal nuclear stress test 08/04/2015   Chronic diastolic heart failure (HCC) 08/04/2015   Essential hypertension, benign 08/04/2015   Polycythemia, secondary 08/04/2015   COPD exacerbation (HCC) 08/04/2015   Cigarette smoker 08/02/2015   Acute on chronic renal insufficiency 07/28/2015   Acute diastolic CHF (congestive heart  failure) (HCC) 07/28/2015   Acute on chronic respiratory failure (HCC) 08/04/2014   Hypoxia 08/03/2014   Lower extremity edema 08/03/2014   Elevated troponin 08/03/2014   Dyspnea 08/03/2014   PCP:  Jonah Negus, NP Pharmacy:   [facility] - [place], [place] - [address] [place] [address] Phone: [phone] Fax: [phone]  [facility] [address] [place] [address] Phone: [phone] Fax: [phone]     Social Drivers of Health (SDOH) Social History: SDOH Screenings   Food Insecurity: No Food Insecurity (08/08/2023)  Housing: Low Risk  (08/08/2023)  Transportation Needs: No Transportation Needs (08/08/2023)  Utilities: Not At Risk (08/08/2023)  Social Connections: Unknown (08/08/2023)  Tobacco Use: High Risk (08/08/2023)   SDOH Interventions:     Readmission Risk Interventions    08/08/2023   10:24 PM  Readmission Risk Prevention Plan  Post Dischage Appt Complete  Medication Screening Complete  Transportation Screening Complete

## 2023-08-15 NOTE — Plan of Care (Signed)

## 2023-08-15 NOTE — Progress Notes (Signed)
 Pharmacy Electrolyte Replacement  Recent Labs:  Recent Labs    08/15/23 0345  K 3.9  MG 2.3  PHOS 1.9*  CREATININE 2.20*    Low Critical Values (K </= 2.5, Phos </= 1, Mg </= 1) Present: None  MD Contacted: n/a  Plan: Kcl 20 meq per tube x1  Valarie Garner, PharmD PGY1 Pharmacy Resident  Please check AMION for all [facility] Pharmacy phone numbers After 10:00 PM, call Main Pharmacy [phone]

## 2023-08-15 NOTE — Progress Notes (Addendum)
 NAME:  Brian Parsons, MRN:  [id number], DOB:  [birth year], LOS: 2 ADMISSION DATE:  08/13/2023, CONSULTATION DATE:  4/28 REFERRING MD:  [facility], CHIEF COMPLAINT:  acute respiratory failure w/ hypoxia send   History of Present Illness:  Patient is a 73 year old male with pertinent PMH COPD on 3L [place], chronic HFpEF, HTN, T2DM, aflutter on eliquis , CAD presents to [facility] on 4/28 with COPD exacerbation.  Patient recently admitted to [facility] 4/23 with COPD exacerbation and given antibiotics for possible CAP.  Discharged on 4/26 with Doxy and prednisone .  Patient was having some worsening SOB at 6 PM on 4/27.  EMS called on 4/28, patient sats 80% on room air and labored breathing.  EMS placed patient on NRB and transferred to [facility].  On arrival patient afebrile and BP stable.  AO x 2.  ABG 7-6 and PCO2 144.  Given IV steroids and placed on BiPAP for work of breathing.  No improvement in ABG and patient required intubation.  Patient became hypotensive and required iv fluid and pressors.  Cultures obtained and placed on vanc/cefepime.  COVID/flu/RSV negative.  LA 1.5.  CXR showing pulmonary edema and small right pleural effusion.  Pertinent ED labs: Troponin 135 then 114, BNP 3211, creat 2.72, BUN 116, glucose 147, AG 24  Pertinent  Medical History   Past Medical History:  Diagnosis Date   Bilateral leg edema    CHF (congestive heart failure) (HCC)    COPD (chronic obstructive pulmonary disease) (HCC)    Coronary atherosclerosis    Nonobstructive at cardiac catheterization April 2017   Diabetes mellitus without complication (HCC)    Essential hypertension    On home O2    2L N/C prn   Polycythemia      Significant Hospital Events: Including procedures, antibiotic start and stop dates in addition to other pertinent events   4/28 admitted to [facility] with acute respiratory failure intubated  Interim History / Subjective:   Remains on the ventilator On amiodarone, Precedex  drips Off pressors Failed PSV weans due low RR  Objective   Blood pressure 98/79, pulse 74, temperature (!) 97.3 F (36.3 C), resp. rate 19, weight 83.6 kg, SpO2 92%.    Vent Mode: PRVC FiO2 (%):  [40 %] 40 % Set Rate:  [18 bmp] 18 bmp Vt Set:  [520 mL] 520 mL PEEP:  [5 cmH20] 5 cmH20 Plateau Pressure:  [18 cmH20-20 cmH20] 18 cmH20   Intake/Output Summary (Last 24 hours) at 08/15/2023 0815 Last data filed at 08/15/2023 0700 Gross per 24 hour  Intake 2220.06 ml  Output 1435 ml  Net 785.06 ml   Filed Weights   08/13/23 1905 08/14/23 0500 08/15/23 0500  Weight: 86.3 kg 89.1 kg 83.6 kg    Examination: Blood pressure 98/79, pulse 74, temperature (!) 97.3 F (36.3 C), resp. rate 19, weight 83.6 kg, SpO2 92%. Gen:      No acute distress HEENT:  EOMI, sclera anicteric Neck:     No masses; no thyromegaly, ETT Lungs:    Clear to auscultation bilaterally; normal respiratory effort CV:         Regular rate and rhythm; no murmurs Abd:      + bowel sounds; soft, non-tender; no palpable masses, no distension Ext:    No edema; adequate peripheral perfusion Skin:      Warm and dry; no rash Neuro: Sedated  Lab/imaging reviewed Significant for BUN/creatinine 134/2.20 WBC 12.6, hemoglobin 16.5, platelets 162 Tracheal aspirate with GPC's, gram-negative  rod Tracheal aspirate with GPC's, gram-negative rod  Resolved Hospital Problem list     Assessment & Plan:  Acute on chronic respiratory failure w/ hypoxia/hypercapnia: on 3L [place] at home COPD vs. CHF exacerbation  -recently admitted to [place] and treated for copd exacerbation and cap Plan: Continue low tidal volume ventilation Wean sedation and start SBT's Follow intermittent ABG, chest x-ray Continue DuoNeb, Pulmicort  IV Solu-Medrol  40 mg every 12 Continue Vanco for now as OSH blood cultures reported gram-positive cocci Repeat blood cultures Ceftriaxone , Doxy for CAP coverage  Sepsis present on admission Plan: Off  pressors Continue antibiotics  Elevated Troponin and bnp Echocardiogram noted with EF 40 to 45% which appears chronic, mild pulmonary hypertension with moderate reduction in RV systolic function Plan: Recheck troponin Lasix  40 mg x 1  AKI on CKD 3b Plan: Follow urine output and creatinine  T2DM Plan: Continue SSI, Semglee   chronic HFpEF HTN CAD Plan: -statin -hold anti-htn meds -iv lasix   Recent atrial flutter: started on eliquis  by cardiology Plan: -tele monitoring -resume home eliquis  -trend electrolytes and replete as needed  Tobacco abuse Plan: -nicotine  patch  Pressure injury Present on admission, wound care  Moderate malnutrition Present on admission Tube feeds, optimize nutrition   Best Practice (right click and "Reselect all SmartList Selections" daily)   Diet/type: tubefeeds DVT prophylaxis DOAC Pressure ulcer(s): present on admission  GI prophylaxis: PPI Lines: N/A Foley:  Yes, and it is still needed Code Status:  full code Last date of multidisciplinary goals of care discussion [Last admission at [facility] patient was DNR/DNI. 4/28 Son Darcel Karwowski updated over phone. States he would like to keep him full code for now.]. Son updated on telephone 4/30  Critical care time:    Sakura Denis MD [place] [facility] See Amion for pager  If no response to pager , please call [phone] until 7pm After 7:00 pm call Elink  [phone] 08/15/2023, 8:15 AM

## 2023-08-15 NOTE — Procedures (Signed)
 Extubation Procedure Note  Patient Details:   Name: Brian Parsons DOB: 1951/03/09 MRN: [id number]   Airway Documentation:    Vent end date: 08/15/23 Vent end time: 1454   Evaluation  O2 sats: stable throughout Complications: No apparent complications Patient did tolerate procedure well. Bilateral Breath Sounds: Diminished   Yes  Pt was extubated per order. Cuff leak positive. Extubated to 4 L . No stridor noted. RT will continue to monitor.  Kalasia Crafton R 08/15/2023, 2:55 PM

## 2023-08-15 NOTE — Progress Notes (Signed)
 Pharmacy ICU Bowel Regimen Consult Note   Current Inpatient Medications for Bowel Management:  Miralax /docusate  senna 2 tabs QHS  Assessment: Brian Parsons is a 73 y.o. year old male admitted on 08/13/2023. Constipation identified as acute opioid-induced constipation . Bowel regimen assessment completed by Hettie Lota, RN on 4/29 (date). Last bowel movement prior to admission. Tube feeds started 4/29.   [x]  Bowel sounds present  [x]  No abdominal tenderness  []  Passing gas   Plan: Increase miralax  and senna to BID dosing MD contacted (if needed): n/a   Thank you for allowing pharmacy to participate in this patient's care.  Jammal Sarr B Elery Cadenhead 08/15/2023,1:53 PM

## 2023-08-15 NOTE — Progress Notes (Signed)
 eLink Physician-Brief Progress Note Patient Name: Brian Parsons DOB: May 09, 1950 MRN: [id number]   Date of Service  08/15/2023  HPI/Events of Note  phos 1.9, K 3.9, Cr 2.2  eICU Interventions  Sodium phos x 1     Intervention Category Major Interventions: Electrolyte abnormality - evaluation and management  [name] 08/15/2023, 6:21 AM

## 2023-08-15 NOTE — Inpatient Diabetes Management (Signed)
 Inpatient Diabetes Program Recommendations  AACE/ADA: New Consensus Statement on Inpatient Glycemic Control (2015)  Target Ranges:  Prepandial:   less than 140 mg/dL      Peak postprandial:   less than 180 mg/dL (1-2 hours)      Critically ill patients:  140 - 180 mg/dL   Lab Results  Component Value Date   GLUCAP 241 (H) 08/15/2023   HGBA1C 6.0 (H) 08/09/2023    Latest Reference Range & Units 08/14/23 07:28 08/14/23 11:31 08/14/23 15:25 08/14/23 19:17 08/14/23 23:31 08/15/23 03:20 08/15/23 07:39 08/15/23 11:21  Glucose-Capillary 70 - 99 mg/dL 811 (H) 914 (H) 782 (H) 229 (H) 222 (H) 233 (H) 274 (H) 241 (H)  (H): Data is abnormally high   Diabetes history: DM2 Outpatient Diabetes medications: None Current orders for Inpatient glycemic control: Semglee  12 units daily, Novolog  0-15 units q 4 hrs,Solumedrol 40 mg Q12H  Inpatient Diabetes Program Recommendations:   While steroids are continued and on tube feeds, please consider: -Novolog  2 units q 4 hrs. Tube feed coverage (hold if tube feed held or stopped for any reason)  Thank you, Puneet Selden E. Levy Cedano, RN, MSN, CDCES  Diabetes Coordinator Inpatient Glycemic Control Team Team Pager [phone] (8am-5pm) 08/15/2023 12:32 PM

## 2023-08-15 NOTE — Progress Notes (Signed)
 Pharmacy Antibiotic Note  Brian Parsons is a 73 y.o. male admitted on 08/13/2023 with pneumonia and bacteremia.  Pharmacy has been consulted for vancomycin dosing.  Called [facility] to follow up on blood culture, per Devra Fontana in their microbiology lab it was coagulase-negative staph in 1/4 bottles. This is likely contaminant, after discussing with primary MD will stop vancomycin.  Plan: Continue doxycycline  and ceftriaxone  Monitor clinical status, renal function, and culture data   Weight: 83.6 kg (184 lb 4.9 oz)  Temp (24hrs), Avg:97.7 F (36.5 C), Min:97 F (36.1 C), Max:98.2 F (36.8 C)  Recent Labs  Lab 08/08/23 1258 08/09/23 0456 08/10/23 0444 08/13/23 2017 08/13/23 2214 08/14/23 0311 08/14/23 0555 08/14/23 1304 08/15/23 0345  WBC 11.5* 7.4  --  15.3*  --  9.3  --   --  12.6*  CREATININE 1.48* 1.48* 1.48* 2.63*  --   --  2.41*  --  2.20*  LATICACIDVEN  --   --   --  5.2* 2.1*  --   --   --   --   VANCORANDOM  --   --   --   --   --   --   --  21 17    Estimated Creatinine Clearance: 30.5 mL/min (A) (by C-G formula based on SCr of 2.2 mg/dL (H)).    Allergies  Allergen Reactions   Citalopram Other (See Comments)    GI bleed   Bupropion Other (See Comments)    Unknown   Escitalopram  Other (See Comments)    Unknown   Penicillins Hives   Trazodone Hcl Other (See Comments)    Unknown   Varenicline Tartrate Other (See Comments)    Bad dreams   Amoxicillin Hives and Rash   Penicillin G Rash    Antimicrobials this admission: Cefepime 4/28 >> 4/29 Doxycycline  4/28 >>  Ceftriaxone  4/29 >>  Microbiology results: 4/28 Bcx from [place] (not in Epic): coag-neg staph in 1/4 bottles 4/28 Sputum: rare GPC in pair, rare GNR  4/28 MRSA PCR: negative  Thank you for allowing pharmacy to be a part of this patient's care.  Lewayne Records, PharmD PGY1 Pharmacy Resident 08/15/2023 11:40 AM

## 2023-08-16 ENCOUNTER — Other Ambulatory Visit: Payer: Self-pay

## 2023-08-16 ENCOUNTER — Encounter (HOSPITAL_COMMUNITY): Payer: Self-pay

## 2023-08-16 ENCOUNTER — Inpatient Hospital Stay (HOSPITAL_COMMUNITY)

## 2023-08-16 DIAGNOSIS — A419 Sepsis, unspecified organism: Secondary | ICD-10-CM | POA: Diagnosis not present

## 2023-08-16 DIAGNOSIS — N1832 Chronic kidney disease, stage 3b: Secondary | ICD-10-CM | POA: Diagnosis not present

## 2023-08-16 DIAGNOSIS — J9621 Acute and chronic respiratory failure with hypoxia: Secondary | ICD-10-CM | POA: Diagnosis not present

## 2023-08-16 DIAGNOSIS — J9622 Acute and chronic respiratory failure with hypercapnia: Secondary | ICD-10-CM | POA: Diagnosis not present

## 2023-08-16 LAB — GLUCOSE, CAPILLARY
Glucose-Capillary: 161 mg/dL — ABNORMAL HIGH (ref 70–99)
Glucose-Capillary: 142 mg/dL — ABNORMAL HIGH (ref 70–99)
Glucose-Capillary: 174 mg/dL — ABNORMAL HIGH (ref 70–99)
Glucose-Capillary: 156 mg/dL — ABNORMAL HIGH (ref 70–99)
Glucose-Capillary: 137 mg/dL — ABNORMAL HIGH (ref 70–99)

## 2023-08-16 LAB — CBC
HCT: 51.2 % (ref 39.0–52.0)
Hemoglobin: 15.8 g/dL (ref 13.0–17.0)
MCH: 29.7 pg (ref 26.0–34.0)
MCHC: 30.9 g/dL (ref 30.0–36.0)
MCV: 96.2 fL (ref 80.0–100.0)
Platelets: 165 10*3/uL (ref 150–400)
RBC: 5.32 MIL/uL (ref 4.22–5.81)
RDW: 15 % (ref 11.5–15.5)
WBC: 18.6 10*3/uL — ABNORMAL HIGH (ref 4.0–10.5)
nRBC: 0 % (ref 0.0–0.2)

## 2023-08-16 LAB — BLOOD GAS, ARTERIAL
Acid-Base Excess: 23.3 mmol/L — ABNORMAL HIGH (ref 0.0–2.0)
Bicarbonate: 51.7 mmol/L — ABNORMAL HIGH (ref 20.0–28.0)
O2 Saturation: 99 %
Patient temperature: 36.3
pCO2 arterial: 69 mmHg (ref 32–48)
pH, Arterial: 7.48 — ABNORMAL HIGH (ref 7.35–7.45)
pO2, Arterial: 86 mmHg (ref 83–108)

## 2023-08-16 LAB — MAGNESIUM: Magnesium: 2.2 mg/dL (ref 1.7–2.4)

## 2023-08-16 LAB — BASIC METABOLIC PANEL WITH GFR
Anion gap: 15 (ref 5–15)
BUN: 120 mg/dL — ABNORMAL HIGH (ref 8–23)
CO2: 40 mmol/L — ABNORMAL HIGH (ref 22–32)
Calcium: 9.3 mg/dL (ref 8.9–10.3)
Chloride: 82 mmol/L — ABNORMAL LOW (ref 98–111)
Creatinine, Ser: 1.78 mg/dL — ABNORMAL HIGH (ref 0.61–1.24)
GFR, Estimated: 40 mL/min — ABNORMAL LOW (ref >60)
Glucose, Bld: 171 mg/dL — ABNORMAL HIGH (ref 70–99)
Potassium: 3.8 mmol/L (ref 3.5–5.1)
Sodium: 137 mmol/L (ref 135–145)

## 2023-08-16 LAB — PHOSPHORUS: Phosphorus: 4 mg/dL (ref 2.5–4.6)

## 2023-08-16 LAB — CULTURE, RESPIRATORY W GRAM STAIN

## 2023-08-16 MED ORDER — DOCUSATE SODIUM 100 MG PO CAPS
100.0000 mg | ORAL_CAPSULE | Freq: Two times a day (BID) | ORAL | Status: DC
  Administered 2023-08-16 – 2023-08-20 (×6): 100 mg via ORAL
  Filled 2023-08-16 (×9): qty 1

## 2023-08-16 MED ORDER — PANTOPRAZOLE SODIUM 40 MG PO TBEC
40.0000 mg | DELAYED_RELEASE_TABLET | Freq: Every day | ORAL | Status: DC
  Administered 2023-08-16 – 2023-08-20 (×5): 40 mg via ORAL
  Filled 2023-08-16 (×5): qty 1

## 2023-08-16 MED ORDER — SODIUM CHLORIDE 0.9 % IV SOLN
2.0000 g | Freq: Two times a day (BID) | INTRAVENOUS | Status: DC
  Administered 2023-08-16: 2 g via INTRAVENOUS
  Filled 2023-08-16: qty 12.5

## 2023-08-16 MED ORDER — BUSPIRONE HCL 5 MG PO TABS
7.5000 mg | ORAL_TABLET | Freq: Two times a day (BID) | ORAL | Status: DC
  Administered 2023-08-16 – 2023-08-21 (×10): 7.5 mg via ORAL
  Filled 2023-08-16: qty 2
  Filled 2023-08-16: qty 1
  Filled 2023-08-16 (×9): qty 2

## 2023-08-16 MED ORDER — FUROSEMIDE 40 MG PO TABS
40.0000 mg | ORAL_TABLET | Freq: Two times a day (BID) | ORAL | Status: DC
  Administered 2023-08-16 – 2023-08-20 (×9): 40 mg via ORAL
  Filled 2023-08-16 (×9): qty 1

## 2023-08-16 MED ORDER — DOXYCYCLINE HYCLATE 100 MG PO TABS
100.0000 mg | ORAL_TABLET | Freq: Two times a day (BID) | ORAL | Status: AC
  Administered 2023-08-16 – 2023-08-17 (×4): 100 mg via ORAL
  Filled 2023-08-16 (×4): qty 1

## 2023-08-16 MED ORDER — ARFORMOTEROL TARTRATE 15 MCG/2ML IN NEBU
15.0000 ug | INHALATION_SOLUTION | Freq: Two times a day (BID) | RESPIRATORY_TRACT | Status: DC
  Administered 2023-08-17 – 2023-08-21 (×9): 15 ug via RESPIRATORY_TRACT
  Filled 2023-08-16 (×11): qty 2

## 2023-08-16 MED ORDER — INSULIN GLARGINE-YFGN 100 UNIT/ML ~~LOC~~ SOLN
6.0000 [IU] | Freq: Every day | SUBCUTANEOUS | Status: DC
  Administered 2023-08-16 – 2023-08-21 (×5): 6 [IU] via SUBCUTANEOUS
  Filled 2023-08-16 (×6): qty 0.06

## 2023-08-16 MED ORDER — LORAZEPAM 0.5 MG PO TABS
0.5000 mg | ORAL_TABLET | Freq: Two times a day (BID) | ORAL | Status: DC | PRN
  Administered 2023-08-16 – 2023-08-21 (×6): 0.5 mg via ORAL
  Filled 2023-08-16 (×8): qty 1

## 2023-08-16 MED ORDER — METHYLPREDNISOLONE SODIUM SUCC 40 MG IJ SOLR
40.0000 mg | Freq: Two times a day (BID) | INTRAMUSCULAR | Status: DC
  Administered 2023-08-16 – 2023-08-18 (×5): 40 mg via INTRAVENOUS
  Filled 2023-08-16 (×5): qty 1

## 2023-08-16 MED ORDER — BISOPROLOL FUMARATE 5 MG PO TABS
2.5000 mg | ORAL_TABLET | Freq: Every day | ORAL | Status: DC
  Administered 2023-08-16 – 2023-08-21 (×6): 2.5 mg via ORAL
  Filled 2023-08-16 (×2): qty 1
  Filled 2023-08-16: qty 0.5
  Filled 2023-08-16 (×3): qty 1

## 2023-08-16 MED ORDER — METHYLPREDNISOLONE SODIUM SUCC 40 MG IJ SOLR: 40.0000 mg | INTRAMUSCULAR | Status: DC

## 2023-08-16 MED ORDER — LEVOFLOXACIN 750 MG PO TABS
750.0000 mg | ORAL_TABLET | ORAL | Status: DC
  Administered 2023-08-16 – 2023-08-20 (×3): 750 mg via ORAL
  Filled 2023-08-16 (×3): qty 1

## 2023-08-16 MED ORDER — REVEFENACIN 175 MCG/3ML IN SOLN
175.0000 ug | Freq: Every day | RESPIRATORY_TRACT | Status: DC
  Administered 2023-08-17 – 2023-08-21 (×5): 175 ug via RESPIRATORY_TRACT
  Filled 2023-08-16 (×6): qty 3

## 2023-08-16 NOTE — Progress Notes (Addendum)
 NAME:  Brian Parsons, MRN:  [id number], DOB:  [birth year], LOS: 3 ADMISSION DATE:  08/13/2023, CONSULTATION DATE:  4/28 REFERRING MD:  [facility], CHIEF COMPLAINT:  acute respiratory failure w/ hypoxia send   History of Present Illness:  Patient is a 73 year old male with pertinent PMH COPD on 3L [place], chronic HFpEF, HTN, T2DM, aflutter on eliquis , CAD presents to [facility] on 4/28 with COPD exacerbation.  Patient recently admitted to [facility] 4/23 with COPD exacerbation and given antibiotics for possible CAP.  Discharged on 4/26 with Doxy and prednisone .  Patient was having some worsening SOB at 6 PM on 4/27.  EMS called on 4/28, patient sats 80% on room air and labored breathing.  EMS placed patient on NRB and transferred to [facility].  On arrival patient afebrile and BP stable.  AO x 2.  ABG 7-6 and PCO2 144.  Given IV steroids and placed on BiPAP for work of breathing.  No improvement in ABG and patient required intubation.  Patient became hypotensive and required iv fluid and pressors.  Cultures obtained and placed on vanc/cefepime .  COVID/flu/RSV negative.  LA 1.5.  CXR showing pulmonary edema and small right pleural effusion.  Pertinent ED labs: Troponin 135 then 114, BNP 3211, creat 2.72, BUN 116, glucose 147, AG 24  Pertinent  Medical History   Past Medical History:  Diagnosis Date   Bilateral leg edema    CHF (congestive heart failure) (HCC)    COPD (chronic obstructive pulmonary disease) (HCC)    Coronary atherosclerosis    Nonobstructive at cardiac catheterization April 2017   Diabetes mellitus without complication (HCC)    Essential hypertension    On home O2    2L N/C prn   Polycythemia    Significant Hospital Events: Including procedures, antibiotic start and stop dates in addition to other pertinent events   4/28 admitted to [facility] with acute respiratory failure intubated 4/30 extubated  Interim History / Subjective:   Extubated yesterday.  No acute  events Sitting in chair at bedside with no complaints  Objective   Blood pressure 119/85, pulse 89, temperature (!) 97.2 F (36.2 C), resp. rate 14, weight 83.6 kg, SpO2 98%.    Vent Mode: PSV;CPAP FiO2 (%):  [40 %] 40 % PEEP:  [5 cmH20] 5 cmH20 Pressure Support:  [5 cmH20-12 cmH20] 5 cmH20   Intake/Output Summary (Last 24 hours) at 08/16/2023 0821 Last data filed at 08/16/2023 0600 Gross per 24 hour  Intake 1474.44 ml  Output 1350 ml  Net 124.44 ml   Filed Weights   08/13/23 1905 08/14/23 0500 08/15/23 0500  Weight: 86.3 kg 89.1 kg 83.6 kg    Examination: Blood pressure 125/79, pulse 89, temperature (!) 97.3 F (36.3 C), resp. rate 17, weight 83.6 kg, SpO2 97%. Gen:      No acute distress HEENT:  EOMI, sclera anicteric Neck:     No masses; no thyromegaly Lungs:    Diminished breath sounds.  No wheeze CV:         Regular rate and rhythm; no murmurs Abd:      + bowel sounds; soft, non-tender; no palpable masses, no distension Ext:    No edema; adequate peripheral perfusion Skin:      Warm and dry; no rash Neuro: alert and oriented x 3 Psych: normal mood and affect   Labs/imaging reviewed Significant for BUN/creatinine 120/1.78 WBC 18.6 Chest x-ray with stable bibasal opacities, vascular congestion Tracheal aspirate shows Pseudomonas  Resolved Hospital Problem list  Assessment & Plan:  Acute on chronic respiratory failure w/ hypoxia/hypercapnia: on 3L [place] at home COPD vs. CHF exacerbation  -recently admitted to [place] and treated for copd exacerbation and cap Plan: Table postextubation Wean down oxygen  as tolerated Continue Pulmicort .  Add Brovana , Yupelri .  DuoNebs as needed Continue IV Solu-Medrol .  Change ceftriaxone  to cefepime  to cover Pseudomonas.  Completed 5 days of doxycycline   Sepsis present on admission Plan: Off pressors Continue antibiotics as above  Elevated Troponin and bnp.  Likely demand ischemia Echocardiogram noted with EF 40 to 45%  which appears chronic, mild pulmonary hypertension with moderate reduction in RV systolic function Plan: Continue Lasix  40 mg PO bid  AKI on CKD 3b Plan: Follow urine output and creatinine  T2DM Plan: Continue SSI, Semglee   chronic HFpEF HTN CAD Plan: Continue statin.  Hold antihypertension medication  Recent atrial flutter: started on eliquis  by cardiology Plan: Telemetry monitoring Eliquis  Stop amio drip Start home bisoprolol .   Hyperglycemia Semglee , SSI coverage  Tobacco abuse Plan: -Nicotine  patch.  Tobacco cessation counseling  Pressure injury Present on admission, wound care  Moderate malnutrition Present on admission P.o. diet, optimize nutrition  Stable for transfer out of ICU and to TRH service  Best Practice (right click and "Reselect all SmartList Selections" daily)   Diet/type: Regular consistency (see orders) DVT prophylaxis DOAC Pressure ulcer(s): present on admission  GI prophylaxis: PPI Lines: N/A Foley:  Yes, and it is no longer needed and removal ordered  Code Status:  full code Last date of multidisciplinary goals of care discussion [Last admission at [facility] patient was DNR/DNI. 4/28 Son Aryon Navarette updated over phone. States he would like to keep him full code for now.]. Son updated on telephone 4/30  Critical care time: NA   Phyllis Breeze MD [facility] See Amion for pager  If no response to pager , please call [phone] until 7pm After 7:00 pm call Elink  [phone] 08/16/2023, 8:21 AM

## 2023-08-16 NOTE — Progress Notes (Signed)
 Bipap PRN and not indicated at this time. Pt resting quietly on 4L nasal cannula without complication. RT will continue to monitor and be available as needed.

## 2023-08-16 NOTE — Progress Notes (Signed)
 Pharmacy ICU Bowel Regimen Consult Note   Current Inpatient Medications for Bowel Management:  Miralax /docusate  senna 2 tabs QHS  Assessment: Brian Parsons is a 73 y.o. year old male admitted on 08/13/2023. Constipation identified as acute opioid-induced constipation . Bowel regimen assessment completed by Genella Kendall, RN on 4/30. Last bowel movement prior to admission.   [x]  Bowel sounds present  [x]  No abdominal tenderness  []  Passing gas   Plan: Patient is refusing bowel regimen ordered. No BM yet. Patient transferring out of ICU today. Will sign off consult.  MD contacted (if needed): n/a   Thank you for allowing pharmacy to participate in this patient's care.  Lenard Quam, PharmD, BCPS, BCCCP Please refer to [facility] for [facility] Pharmacy numbers 08/16/2023,11:07 AM

## 2023-08-16 NOTE — NC FL2 (Signed)
 [place]  MEDICAID FL2 LEVEL OF CARE FORM     IDENTIFICATION  Patient Name: Brian Parsons Birthdate: May 10, 1950 Sex: male Admission Date (Current Location): 08/13/2023  [facility] and [place] Number:  Producer, television/film/video and Address:  [facility], [address] [place], [place] [address]      Provider Number: [id number]  Attending Physician Name and Address:  Mannam, Praveen, MD  Relative Name and Phone Number:       Current Level of Care: Hospital Recommended Level of Care: Skilled Nursing Facility Prior Approval Number:    Date Approved/Denied:   PASRR Number: [id number]  Discharge Plan: SNF    Current Diagnoses: Patient Active Problem List   Diagnosis Date Noted   Pressure injury of skin 08/15/2023   Atrial flutter (HCC) 08/10/2023   Panic attacks 03/22/2023   Chronic right-sided heart failure (HCC) 03/23/2022   Atrial arrhythmia 03/23/2022   Abnormal ECG 03/23/2022   Peripheral edema 03/23/2022   Malnutrition of moderate degree 03/21/2022   Acute respiratory failure with hypoxia (HCC) 03/19/2022   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 / group D symptom/ risk 10/14/2019   Type 2 diabetes mellitus with stage 3a chronic kidney disease, without long-term current use of insulin  (HCC) 03/06/2019   Mixed hyperlipidemia 03/06/2019   Class 2 severe obesity due to excess calories with serious comorbidity and body mass index (BMI) of 36.0 to 36.9 in adult (HCC) 03/06/2019   Benign hypertensive kidney disease with chronic kidney disease 01/07/2019   Chronic kidney disease, stage III (moderate) (HCC) 01/07/2019   Edema 10/01/2017   CHF, acute on chronic (HCC) 04/29/2016   AKI (acute kidney injury) (HCC) 04/29/2016   CHF exacerbation (HCC) 04/29/2016   CHF (congestive heart failure)  (HCC) 02/24/2016   Tobacco abuse 01/05/2016   Exertional dyspnea    Abnormal nuclear stress test 08/04/2015   Chronic diastolic heart failure (HCC) 08/04/2015   Essential hypertension, benign 08/04/2015   Polycythemia, secondary 08/04/2015   COPD exacerbation (HCC) 08/04/2015   Cigarette smoker 08/02/2015   Acute on chronic renal insufficiency 07/28/2015   Acute diastolic CHF (congestive heart failure) (HCC) 07/28/2015   Acute on chronic respiratory failure (HCC) 08/04/2014   Hypoxia 08/03/2014   Lower extremity edema 08/03/2014   Elevated troponin 08/03/2014   Dyspnea 08/03/2014    Orientation RESPIRATION BLADDER Height & Weight     Self, Situation, Place  O2 (4L [place]) Continent Weight: 184 lb 4.9 oz (83.6 kg) Height:  5\' 7"  (170.2 cm)  BEHAVIORAL SYMPTOMS/MOOD NEUROLOGICAL BOWEL NUTRITION STATUS      Continent Diet (see dc summary)  AMBULATORY STATUS COMMUNICATION OF NEEDS Skin     Verbally PU Stage and Appropriate Care (Pressure Injury: Coccyx Medial Stage 2 - Partial thickness loss of dermis presenting as a shallow open injury with a red, pink wound bed without slough.)                       Personal Care Assistance Level of Assistance  Bathing, Feeding, Dressing Bathing Assistance:  (see dc summary) Feeding assistance:  (see dc summary) Dressing Assistance:  (see dc summary)     Functional Limitations Info  Sight, Speech, Hearing Sight Info: Impaired (Eyeglasses) Hearing Info: Adequate Speech Info: Adequate    SPECIAL CARE FACTORS FREQUENCY  PT (By licensed PT),  OT (By licensed OT)     PT Frequency: 5x week OT Frequency: 5x week            Contractures Contractures Info: Not present    Additional Factors Info  Code Status, Allergies, Insulin  Sliding Scale, Psychotropic Code Status Info: Full Allergies Info: Citalopram, Bupropion, Escitalopram , Penicillins, Trazodone Hcl, Varenicline Tartrate, Amoxicillin, Penicillin G Psychotropic Info:  BUSPAR  Insulin  Sliding Scale Info: see dc summary       Current Medications (08/16/2023):  This is the current hospital active medication list Current Facility-Administered Medications  Medication Dose Route Frequency Provider Last Rate Last Admin   acetaminophen  (TYLENOL ) tablet 650 mg  650 mg Oral Q4H PRN Adaline Ada, RPH       apixaban  (ELIQUIS ) tablet 5 mg  5 mg Oral BID Adaline Ada, RPH   5 mg at 08/16/23 1028   arformoterol  (BROVANA ) nebulizer solution 15 mcg  15 mcg Nebulization BID Mannam, Praveen, MD       atorvastatin  (LIPITOR) tablet 20 mg  20 mg Oral q1800 Adaline Ada, RPH       bisoprolol  (ZEBETA ) tablet 2.5 mg  2.5 mg Oral Daily Mannam, Praveen, MD   2.5 mg at 08/16/23 1034   budesonide  (PULMICORT ) nebulizer solution 0.5 mg  0.5 mg Nebulization BID Payne, John D, PA-C   0.5 mg at 08/16/23 0836   busPIRone  (BUSPAR ) tablet 7.5 mg  7.5 mg Oral BID Millen, Jessica B, RPH   7.5 mg at 08/16/23 1029   Chlorhexidine  Gluconate Cloth 2 % PADS 6 each  6 each Topical Daily Claven Cumming, MD   6 each at 08/15/23 2100   docusate sodium  (COLACE) capsule 100 mg  100 mg Oral BID PRN Payne, John D, PA-C       docusate sodium  (COLACE) capsule 100 mg  100 mg Oral BID Millen, Jessica B, RPH       doxycycline  (VIBRA -TABS) tablet 100 mg  100 mg Oral Q12H Millen, Jessica B, RPH   100 mg at 08/16/23 1028   furosemide  (LASIX ) tablet 40 mg  40 mg Oral BID Mannam, Praveen, MD   40 mg at 08/16/23 1028   insulin  aspart (novoLOG ) injection 0-15 Units  0-15 Units Subcutaneous Q4H Payne, John D, PA-C   3 Units at 08/16/23 1234   insulin  glargine-yfgn (SEMGLEE ) injection 6 Units  6 Units Subcutaneous Daily Mannam, Praveen, MD   6 Units at 08/16/23 1029   ipratropium-albuterol  (DUONEB) 0.5-2.5 (3) MG/3ML nebulizer solution 3 mL  3 mL Nebulization Q4H PRN Payne, John D, PA-C   3 mL at 08/16/23 1217   levofloxacin  (LEVAQUIN ) tablet 750 mg  750 mg Oral Q48H Mannam, Praveen, MD       LORazepam  (ATIVAN ) tablet 0.5  mg  0.5 mg Oral BID PRN Mannam, Praveen, MD   0.5 mg at 08/16/23 1244   methylPREDNISolone  sodium succinate (SOLU-MEDROL ) 40 mg/mL injection 40 mg  40 mg Intravenous Q12H Mannam, Praveen, MD   40 mg at 08/16/23 1234   multivitamin with minerals tablet 1 tablet  1 tablet Oral Daily Adaline Ada, RPH   1 tablet at 08/16/23 1029   nicotine  (NICODERM CQ  - dosed in mg/24 hours) patch 14 mg  14 mg Transdermal Daily Payne, John D, PA-C   14 mg at 08/16/23 1029   Oral care mouth rinse  15 mL Mouth Rinse 4 times per day Mannam, Praveen, MD   15 mL at 08/16/23 0800   Oral care mouth rinse  15 mL Mouth Rinse  PRN Mannam, Praveen, MD       pantoprazole  (PROTONIX ) EC tablet 40 mg  40 mg Oral QHS Millen, Jessica B, RPH       polyethylene glycol (MIRALAX  / GLYCOLAX ) packet 17 g  17 g Oral Daily PRN Payne, John D, PA-C       polyethylene glycol (MIRALAX  / GLYCOLAX ) packet 17 g  17 g Oral BID Adaline Ada, RPH       revefenacin  (YUPELRI ) nebulizer solution 175 mcg  175 mcg Nebulization Daily Mannam, Praveen, MD       senna (SENOKOT) tablet 17.2 mg  2 tablet Oral BID Adaline Ada, [facility]         Discharge Medications: Please see discharge summary for a list of discharge medications.  Relevant Imaging Results:  Relevant Lab Results:   Additional Information SSN [id number] [place], LCSWA

## 2023-08-16 NOTE — TOC Progression Note (Addendum)
 Transition of Care [facility]) - Progression Note    Patient Details  Name: Brian Parsons MRN: [id number] Date of Birth: [birth year]  Transition [facility]) CM/SW Contact  Arron Big, [place] Phone Number: 08/16/2023, 3:31 PM  Clinical Narrative:   CSW spoke with Merlyn Starring, patients son, about PT recs for SNF. Merlyn Starring stated he does not think patient will want to go to SNF. CSW told Merlyn Starring that I will ask patient what he wants to do. Merlyn Starring stated "okay".   CSW introduced self/role to patient. CSW spoke with patient at bedside about PT recs for SNF. Patient is agreeable at this time and states he has been to SNF in the past - he could not recall the name.   TOC will continue to follow.    Expected Discharge Plan: Skilled Nursing Facility Barriers to Discharge: Continued Medical Work up, SNF Pending bed offer, Insurance Authorization  Expected Discharge Plan and Services       Living arrangements for the past 2 months: Single Family Home                                       Social Determinants of Health (SDOH) Interventions SDOH Screenings   Food Insecurity: No Food Insecurity (08/16/2023)  Housing: Low Risk  (08/16/2023)  Transportation Needs: No Transportation Needs (08/16/2023)  Utilities: Not At Risk (08/16/2023)  Social Connections: Patient Declined (08/16/2023)  Tobacco Use: High Risk (08/16/2023)    Readmission Risk Interventions    08/08/2023   10:24 PM  Readmission Risk Prevention Plan  Post Dischage Appt Complete  Medication Screening Complete  Transportation Screening Complete

## 2023-08-16 NOTE — Evaluation (Signed)
 Physical Therapy Evaluation Patient Details Name: Brian Parsons MRN: [id number] DOB: [birth year] Today's Date: 08/16/2023  History of Present Illness  73 y.o. male admitted 08/13/23 with COPD exacerbation. VDRF 4/28-4/30. PMHx: Recent admission [facility] 4/23-4/26/25. DVT, tobacco use, severe COPD on 4L, secondary polycythemia, chronic HFpEF, HTN, T2DM, nonobstructive CAD  Clinical Impression  Pt pleasant, unaware of date and slow to respond to cues with some noted confusion during transfers. Pt on 8L on arrival with SPO2 96% and able to tolerate sitting on 6L but desaturation to 84% on 8L with limited mobility needing seated rest to recover. 92% on 6L end of session and pt normally maintains 4L at home. Pt lives with fiancee and walks with RW in home distances. Pt with decreased strength, transfers, gait and cardiopulmonary function who will benefit from acute therapy to maximize mobility and safety.         If plan is discharge home, recommend the following: Help with stairs or ramp for entrance;Assist for transportation;A lot of help with walking and/or transfers;A little help with bathing/dressing/bathroom;Assistance with cooking/housework   Can travel by private vehicle   Yes    Equipment Recommendations None recommended by PT  Recommendations for Other Services  OT consult    Functional Status Assessment Patient has had a recent decline in their functional status and demonstrates the ability to make significant improvements in function in a reasonable and predictable amount of time.     Precautions / Restrictions Precautions Precautions: Fall;Other (comment) Recall of Precautions/Restrictions: Impaired Precaution/Restrictions Comments: watch SpO2      Mobility  Bed Mobility Overal bed mobility: Needs Assistance Bed Mobility: Supine to Sit     Supine to sit: Min assist, HOB elevated, Used rails     General bed mobility comments: HOB 35 degrees, increased time and hand held assist  to elevate trunk from surface    Transfers Overall transfer level: Needs assistance   Transfers: Sit to/from Stand, Bed to chair/wheelchair/BSC Sit to Stand: Min assist   Step pivot transfers: Min assist, +2 safety/equipment       General transfer comment: min assist to stand from bed x 2 and from recliner x 1. Cues for hand placement, assist to power up. static standing with max assist for pericare with incontinent BM    Ambulation/Gait Ambulation/Gait assistance: Min assist, +2 safety/equipment Gait Distance (Feet): 20 Feet Assistive device: Rolling walker (2 wheels) Gait Pattern/deviations: Step-through pattern, Decreased stride length   Gait velocity interpretation: <1.8 ft/sec, indicate of risk for recurrent falls   General Gait Details: flexed trunk, reliance on RW, shuffling gait with pt able to walk to door with close chair follow. Pt with desaturation to 84% on 8L with gait and tremors with activity  Stairs            Wheelchair Mobility     Tilt Bed    Modified Rankin (Stroke Patients Only)       Balance Overall balance assessment: Needs assistance   Sitting balance-Leahy Scale: Fair     Standing balance support: Bilateral upper extremity supported, Reliant on assistive device for balance, During functional activity Standing balance-Leahy Scale: Poor Standing balance comment: Rw for gait                             Pertinent Vitals/Pain Pain Assessment Pain Assessment: No/denies pain    Home Living Family/patient expects to be discharged to:: Private residence Living Arrangements: Spouse/significant other;Children Available Help at  Discharge: Family;Available 24 hours/day Type of Home: Mobile home Home Access: Stairs to enter Entrance Stairs-Rails: Right;Left Entrance Stairs-Number of Steps: 4   Home Layout: One level Home Equipment: Agricultural consultant (2 wheels);Cane - single point;Wheelchair - manual;Grab bars - tub/shower;Grab  bars - toilet      Prior Function Prior Level of Function : Needs assist             Mobility Comments: walks in home with RW ADLs Comments: reports completing ADLs and some cooking independently, fiancee shares IADLs     Extremity/Trunk Assessment   Upper Extremity Assessment Upper Extremity Assessment: Generalized weakness    Lower Extremity Assessment Lower Extremity Assessment: Generalized weakness    Cervical / Trunk Assessment Cervical / Trunk Assessment: Kyphotic  Communication   Communication Communication: No apparent difficulties    Cognition Arousal: Alert Behavior During Therapy: Flat affect   PT - Cognitive impairments: Orientation, Memory, Safety/Judgement, Problem solving   Orientation impairments: Time                     Following commands: Impaired Following commands impaired: Follows one step commands with increased time     Cueing Cueing Techniques: Verbal cues, Visual cues, Gestural cues     General Comments      Exercises     Assessment/Plan    PT Assessment Patient needs continued PT services  PT Problem List Decreased strength;Decreased activity tolerance;Decreased balance;Decreased mobility;Decreased safety awareness;Decreased knowledge of use of DME;Decreased cognition;Decreased coordination;Cardiopulmonary status limiting activity       PT Treatment Interventions Gait training;DME instruction;Balance training;Stair training;Functional mobility training;Therapeutic activities;Therapeutic exercise;Patient/family education;Cognitive remediation;Neuromuscular re-education    PT Goals (Current goals can be found in the Care Plan section)  Acute Rehab PT Goals Patient Stated Goal: return home PT Goal Formulation: With patient Time For Goal Achievement: 08/30/23 Potential to Achieve Goals: Fair    Frequency Min 2X/week     Co-evaluation               AM-PAC PT "6 Clicks" Mobility  Outcome Measure Help needed  turning from your back to your side while in a flat bed without using bedrails?: A Little Help needed moving from lying on your back to sitting on the side of a flat bed without using bedrails?: A Little Help needed moving to and from a bed to a chair (including a wheelchair)?: A Little Help needed standing up from a chair using your arms (e.g., wheelchair or bedside chair)?: A Little Help needed to walk in hospital room?: A Lot Help needed climbing 3-5 steps with a railing? : Total 6 Click Score: 15    End of Session Equipment Utilized During Treatment: Gait belt;Oxygen  Activity Tolerance: Patient tolerated treatment well Patient left: in chair;with call bell/phone within reach;with chair alarm set Nurse Communication: Mobility status PT Visit Diagnosis: Other abnormalities of gait and mobility (R26.89);History of falling (Z91.81);Difficulty in walking, not elsewhere classified (R26.2);Muscle weakness (generalized) (M62.81)    Time: 1610-9604 PT Time Calculation (min) (ACUTE ONLY): 29 min   Charges:   PT Evaluation $PT Eval Moderate Complexity: 1 Mod PT Treatments $Therapeutic Activity: 8-22 mins PT General Charges $$ ACUTE PT VISIT: 1 Visit         Annis Baseman, PT Acute Rehabilitation Services Office: [phone]   Brian Parsons 08/16/2023, 9:37 AM

## 2023-08-16 NOTE — Progress Notes (Addendum)
 Pharmacy Antibiotic Note  Brian Parsons is a 73 y.o. male admitted on 08/13/2023 with HCAP pneumonia.  Pharmacy has been consulted for Cefepime  dosing. SCr trending down at 1.78. WBC up 18.6. On Doxycycline  to complete 5 days for atypical coverage.   Plan: Cefepime  2g IV every 12 hours  x7 days Monitor renal function, culture susceptibilities, and clinical status.   Addendum: Pseudomonas resistant to cefepime . Sensitive to ciprofloxacin/levaquin  and imipenem.   Plan: Change to Levaquin  750mg  IV q48hrs - Total 7 days.   Weight: 83.6 kg (184 lb 4.9 oz)  Temp (24hrs), Avg:97.4 F (36.3 C), Min:96.6 F (35.9 C), Max:98.4 F (36.9 C)  Recent Labs  Lab 08/10/23 0444 08/13/23 2017 08/13/23 2214 08/14/23 0311 08/14/23 0555 08/14/23 1304 08/15/23 0345 08/16/23 0347  WBC  --  15.3*  --  9.3  --   --  12.6* 18.6*  CREATININE 1.48* 2.63*  --   --  2.41*  --  2.20* 1.78*  LATICACIDVEN  --  5.2* 2.1*  --   --   --   --   --   VANCORANDOM  --   --   --   --   --  21 17  --     Estimated Creatinine Clearance: 37.6 mL/min (A) (by C-G formula based on SCr of 1.78 mg/dL (H)).    Allergies  Allergen Reactions   Citalopram Other (See Comments)    GI bleed   Bupropion Other (See Comments)    Unknown   Escitalopram  Other (See Comments)    Unknown   Penicillins Hives   Trazodone Hcl Other (See Comments)    Unknown   Varenicline Tartrate Other (See Comments)    Bad dreams   Amoxicillin Hives and Rash   Penicillin G Rash    Antimicrobials this admission: Ceftriaxone  4/29 >>5/1 Cefepime  5/1 >> Doxycycline  4/28 >>  Dose adjustments this admission:   Microbiology results: 4/30 BCx:  4/28 Sputum: few Pseudomonas aeruginosa  4/28 MRSA PCR: negative  Thank you for allowing pharmacy to be a part of this patient's care.  Lenard Quam, PharmD, BCPS, BCCCP Please refer to [facility] for [facility] numbers 08/16/2023 8:46 AM

## 2023-08-16 NOTE — Progress Notes (Signed)
 Nutrition Follow-up  DOCUMENTATION CODES:  Non-severe (moderate) malnutrition in context of chronic illness  INTERVENTION:  Continue current diet as ordered Encourage PO intake MVI with minerals daily Ensure Enlive po BID, each supplement provides 350 kcal and 20 grams of protein.  NUTRITION DIAGNOSIS:  Moderate Malnutrition related to chronic illness (COPD) as evidenced by mild fat depletion, mild muscle depletion. - progressing  GOAL:  Patient will meet greater than or equal to 90% of their needs - progressing  MONITOR:  TF tolerance, I & O's, Vent status, Labs  REASON FOR ASSESSMENT:  Ventilator, Consult Enteral/tube feeding initiation and management  ASSESSMENT:  Pt with hx of COPD on home O2, CHF, DMT2, CAD, and HTN presented to outside hospital ED with acute respiratory failure in the setting of a COPD exacerbation. Transferred to [facility] after intubation.  4/28 - presented to hospital in [place], intubated, transferred to [facility]  4/30 - extubation  Pt being transferred out of ICU at the time of assessment. Able to be extubated yesterday and able to pass a yale swallow and have diet order placed. Stable to be discharged out of ICU. Will add supplements to augment intake in the presence of his malnutrition.   Admit weight: 86.3 kg  Current weight: 89.1 kg   Intake/Output Summary (Last 24 hours) at 08/16/2023 1417 Last data filed at 08/16/2023 1000 Gross per 24 hour  Intake 745.42 ml  Output 1600 ml  Net -854.58 ml  Net IO Since Admission: 502 mL [08/16/23 1417]  Drains/Lines: UOP out   Nutritionally Relevant Medications: Scheduled Meds:  atorvastatin   20 mg Oral q1800   docusate sodium   100 mg Oral BID   doxycycline   100 mg Oral Q12H   furosemide   40 mg Oral BID   insulin  aspart  0-15 Units Subcutaneous Q4H   insulin  glargine-yfgn  6 Units Subcutaneous Daily   methylPREDNISolone    40 mg Intravenous Q24H   multivitamin with minerals  1 tablet  Oral Daily   pantoprazole   40 mg Oral QHS   polyethylene glycol  17 g Oral BID   senna  2 tablet Oral BID   Continuous Infusions:  ceFEPime  (MAXIPIME ) IV     PRN Meds: docusate sodium , polyethylene glycol  Labs Reviewed: Chloride 82 BUN 120, creatinine 1.78 CBG ranges from 142-274 mg/dL over the last 24 hours HgbA1c 6.0% (4/24)  NUTRITION - FOCUSED PHYSICAL EXAM: Flowsheet Row Most Recent Value  Orbital Region Severe depletion  Upper Arm Region Mild depletion  Thoracic and Lumbar Region No depletion  Buccal Region Unable to assess  Temple Region Mild depletion  Clavicle Bone Region Mild depletion  Clavicle and Acromion Bone Region Moderate depletion  Scapular Bone Region Moderate depletion  Dorsal Hand Unable to assess  Anterior Thigh Region No depletion  Posterior Calf Region No depletion  Edema (RD Assessment) Mild  [BLE]  Hair Reviewed  Eyes Reviewed  Mouth Reviewed  Skin Reviewed  [red, dry]  Nails Unable to assess   Diet Order:   Diet Order             DIET DYS 3 Room service appropriate? Yes; Fluid consistency: Thin  Diet effective now                  EDUCATION NEEDS:  Not appropriate for education at this time  Skin:  Skin Assessment: Skin Integrity Issues: Stage 2: - Coccyx  Last BM:  unsure  Height:  Ht Readings from Last 1 Encounters:  08/08/23 5\' 7"  (1.702 m)    Weight:  Wt Readings from Last 1 Encounters:  08/15/23 83.6 kg    Ideal Body Weight:  67.3 kg  BMI:  Body mass index is 28.87 kg/m.  Estimated Nutritional Needs:  Kcal:  1800-2000 kcal/d Protein:  90-110g/d Fluid:  1.8-2L/d    Edwena Graham, RD, LDN Registered Dietitian II Please reach out via secure chat

## 2023-08-17 DIAGNOSIS — J9601 Acute respiratory failure with hypoxia: Secondary | ICD-10-CM

## 2023-08-17 LAB — GLUCOSE, CAPILLARY
Glucose-Capillary: 133 mg/dL — ABNORMAL HIGH (ref 70–99)
Glucose-Capillary: 104 mg/dL — ABNORMAL HIGH (ref 70–99)
Glucose-Capillary: 122 mg/dL — ABNORMAL HIGH (ref 70–99)
Glucose-Capillary: 178 mg/dL — ABNORMAL HIGH (ref 70–99)
Glucose-Capillary: 182 mg/dL — ABNORMAL HIGH (ref 70–99)
Glucose-Capillary: 164 mg/dL — ABNORMAL HIGH (ref 70–99)

## 2023-08-17 LAB — BASIC METABOLIC PANEL WITH GFR
Anion gap: 14 (ref 5–15)
BUN: 101 mg/dL — ABNORMAL HIGH (ref 8–23)
CO2: 43 mmol/L — ABNORMAL HIGH (ref 22–32)
Calcium: 9.5 mg/dL (ref 8.9–10.3)
Chloride: 80 mmol/L — ABNORMAL LOW (ref 98–111)
Creatinine, Ser: 1.63 mg/dL — ABNORMAL HIGH (ref 0.61–1.24)
GFR, Estimated: 44 mL/min — ABNORMAL LOW (ref >60)
Glucose, Bld: 103 mg/dL — ABNORMAL HIGH (ref 70–99)
Potassium: 4.9 mmol/L (ref 3.5–5.1)
Sodium: 137 mmol/L (ref 135–145)

## 2023-08-17 LAB — CBC
HCT: 51.5 % (ref 39.0–52.0)
Hemoglobin: 16 g/dL (ref 13.0–17.0)
MCH: 29.6 pg (ref 26.0–34.0)
MCHC: 31.1 g/dL (ref 30.0–36.0)
MCV: 95.4 fL (ref 80.0–100.0)
Platelets: 138 10*3/uL — ABNORMAL LOW (ref 150–400)
RBC: 5.4 MIL/uL (ref 4.22–5.81)
RDW: 14.8 % (ref 11.5–15.5)
WBC: 12.2 10*3/uL — ABNORMAL HIGH (ref 4.0–10.5)
nRBC: 0 % (ref 0.0–0.2)

## 2023-08-17 MED ORDER — INSULIN ASPART 100 UNIT/ML IJ SOLN
0.0000 [IU] | Freq: Three times a day (TID) | INTRAMUSCULAR | Status: DC
  Administered 2023-08-17 (×2): 3 [IU] via SUBCUTANEOUS
  Administered 2023-08-18 (×2): 2 [IU] via SUBCUTANEOUS
  Administered 2023-08-19: 5 [IU] via SUBCUTANEOUS
  Administered 2023-08-20 (×2): 2 [IU] via SUBCUTANEOUS
  Administered 2023-08-20 – 2023-08-21 (×2): 5 [IU] via SUBCUTANEOUS

## 2023-08-17 MED ORDER — GUAIFENESIN ER 600 MG PO TB12
1200.0000 mg | ORAL_TABLET | Freq: Two times a day (BID) | ORAL | Status: DC
  Administered 2023-08-17 – 2023-08-21 (×9): 1200 mg via ORAL
  Filled 2023-08-17 (×9): qty 2

## 2023-08-17 MED ORDER — ALBUTEROL SULFATE (2.5 MG/3ML) 0.083% IN NEBU
2.5000 mg | INHALATION_SOLUTION | Freq: Four times a day (QID) | RESPIRATORY_TRACT | Status: DC
Start: 2023-08-17 — End: 2023-08-17

## 2023-08-17 NOTE — Plan of Care (Signed)
  Problem: Elimination: Goal: Will not experience complications related to urinary retention Outcome: Progressing   Problem: Pain Managment: Goal: General experience of comfort will improve and/or be controlled Outcome: Progressing   Problem: Education: Goal: Knowledge of General Education information will improve Description: Including pain rating scale, medication(s)/side effects and non-pharmacologic comfort measures Outcome: Not Progressing   Problem: Clinical Measurements: Goal: Respiratory complications will improve Outcome: Not Progressing   Problem: Activity: Goal: Risk for activity intolerance will decrease Outcome: Not Progressing   Problem: Coping: Goal: Level of anxiety will decrease Outcome: Not Progressing   Problem: Activity: Goal: Ability to tolerate increased activity will improve Outcome: Not Progressing

## 2023-08-17 NOTE — Progress Notes (Signed)
 RT notified to come and place patient on BIPAP due to his ABG results. She agreed to come back and do it.

## 2023-08-17 NOTE — Progress Notes (Signed)
 PROGRESS NOTE    Brian Parsons  ZOX:[id number] DOB: [birth year] DOA: 08/13/2023 PCP: Jonah Negus, NP   Brief Narrative: 73 year old with past medical history significant for COPD on 2 L nasal cannula, chronic heart failure preserved ejection fraction, hypertension, diabetes type 2, A-flutter on Eliquis , CAD presents to [facility] 4/28 with COPD exacerbation.  Patient was recently admitted to [facility] from [facility] and discharged 426 on Doxy and prednisone .  He developed worsening shortness of breath and hypoxia and on 4/28 he was taken  to [facility] health.  On arrival patient was placed on BiPAP, no improvement of ABG on BiPAP and he required intubation.  He became hypotensive and required IV fluids and IV pressors.  He was admitted by CCM on 4/28 intubated.  Patient was extubated 4/30.  Care transferred to [facility] 5[facility]    Assessment & Plan:   Principal Problem:   Acute respiratory failure with hypoxia (HCC) Active Problems:   Malnutrition of moderate degree   Pressure injury of skin   1-Acute Hypoxic and Hypercapnia respiratory failure, on chronic 3 L at home COPD exacerbation versus CHF exacerbation - Patient recently  admitted  at [facility] treated for COPD exacerbation and CAP. Aaron AasBiPAP as needed .Continue Pulmicort , Brovana , Yupelri . Add albuterol / Guaifenesin  and flutter valve -I.V steroids -On Levaquin  to cover for Pseudomonas. -Continue with steroids and current tx, still with tachypnea. Added BIPAP at HS.   Sepsis, POA: in setting pseudomonas PNA Required IV pressors, leukocytosis, lactic acidosis at 5, sputum culture grew Pseudomonas. Chest x ray pulmonary atelectasis.  Continue with Levaquin .    Elevated troponin and abnormal BNP, likely demand ischemia Echo ejection fraction 40 to 45%, mild pulmonary hypertension mild reduced RV systolic function Continue with Lasix   AKI on CKD 3B:  -Presents with Cr 2.6--2.4 - Creatinine baseline 1.4 Suspect in the setting  of hypotension, sepsis, could be ATN Renal function improving. Cr down to 1.6 Monitor on Lasix   Diabetes type 2 Hyperglycemia - Continue with Symlin 6 units daily, NovoLog  3 times a day with meals  Acute systolic heart failure, history of chronic diastolic heart failure Hypertension CAD - Continue with IV Lasix  - Ejection fraction down to 40 to 45% on 2D echo/25.  Cardiology was aware during last hospitalization. - Mild chronic hypertension with moderate resolution of RV systolic function -Follow-up with cardiology as an outpatient  Recent atrial flutter: He was started on Eliquis  by cardiology, continue Eliquis .  Resume bisoprolol .  Amiodarone  was discontinued in the ICU   Tobacco abuse Smoking cessation  Moderate malnutrition On supplements  Pressure injury  See wound care documentation below  Pressure Injury 08/13/23 Coccyx Medial Stage 2 -  Partial thickness loss of dermis presenting as a shallow open injury with a red, pink wound bed without slough. (Active)  08/13/23 1920  Location: Coccyx  Location Orientation: Medial  Staging: Stage 2 -  Partial thickness loss of dermis presenting as a shallow open injury with a red, pink wound bed without slough.  Wound Description (Comments):   Present on Admission: Yes  Dressing Type Foam - Lift dressing to assess site every shift 08/16/23 2050     Nutrition Problem: Moderate Malnutrition Etiology: chronic illness (COPD)    Signs/Symptoms: mild fat depletion, mild muscle depletion    Interventions: Prostat, Tube feeding, MVI  Estimated body mass index is 31.17 kg/m as calculated from the following:   Height as of this encounter: 5\' 7"  (1.702 m).   Weight as of this encounter:  90.3 kg.   DVT prophylaxis: Eliquis  Code Status: Full code Family Communication: Discussed with patient Disposition Plan:  Status is: Inpatient Remains inpatient appropriate because: He agreed to go to a rehab, suspect will be ready 1 or 2  days    Consultants:  CCM admitted patient  Procedures:  4/30 extubated   Antimicrobials:    Subjective: He is alert, report breathing better. Productive cough, thick phlegm.   Objective: Vitals:   08/17/23 0054 08/17/23 0527 08/17/23 0715 08/17/23 0716  BP: (!) 152/76 (!) 141/96    Pulse: (!) 52 69    Resp:  18    Temp:  97.7 F (36.5 C)    TempSrc:  Oral    SpO2: 94% 97% 94% 94%  Weight:  90.3 kg    Height:        Intake/Output Summary (Last 24 hours) at 08/17/2023 0733 Last data filed at 08/17/2023 0529 Gross per 24 hour  Intake 505.34 ml  Output 2900 ml  Net -2394.66 ml   Filed Weights   08/14/23 0500 08/15/23 0500 08/17/23 0527  Weight: 89.1 kg 83.6 kg 90.3 kg    Examination:  General exam: Appears calm and comfortable  Respiratory system: Clear to auscultation. Respiratory effort normal. Cardiovascular system: S1 & S2 heard, RRR. No JVD, murmurs, rubs, gallops or clicks. No pedal edema. Gastrointestinal system: Abdomen is nondistended, soft and nontender.  Central nervous system: Alert and oriented.  Extremities: Symmetric 5 x 5 power.    Data Reviewed: I have personally reviewed following labs and imaging studies  CBC: Recent Labs  Lab 08/13/23 2017 08/13/23 2024 08/14/23 0311 08/15/23 0345 08/16/23 0347 08/17/23 0338  WBC 15.3*  --  9.3 12.6* 18.6* 12.2*  NEUTROABS 13.1*  --   --   --   --   --   HGB 17.1* 18.7* 10.1* 16.5 15.8 16.0  HCT 55.8* 55.0* 33.5* 51.7 51.2 51.5  MCV 98.4  --  98.2 92.2 96.2 95.4  PLT 132*  --  147* 162 165 138*   Basic Metabolic Panel: Recent Labs  Lab 08/13/23 2017 08/13/23 2024 08/14/23 0555 08/15/23 0345 08/16/23 0347 08/17/23 0338  NA 139 134* 138 139 137 137  K 4.7 3.8 3.6 3.9 3.8 4.9  CL 79*  --  80* 85* 82* 80*  CO2 35*  --  39* 39* 40* 43*  GLUCOSE 221*  --  322* 214* 171* 103*  BUN 121*  --  121* 134* 120* 101*  CREATININE 2.63*  --  2.41* 2.20* 1.78* 1.63*  CALCIUM  9.0  --  9.2 9.1 9.3 9.5   MG 2.2  --   --  2.3 2.2  --   PHOS  --   --   --  1.9* 4.0  --    GFR: Estimated Creatinine Clearance: 42.6 mL/min (A) (by C-G formula based on SCr of 1.63 mg/dL (H)). Liver Function Tests: Recent Labs  Lab 08/13/23 2017  AST 31  ALT 22  ALKPHOS 70  BILITOT 2.8*  PROT 5.8*  ALBUMIN 2.8*   No results for input(s): "LIPASE", "AMYLASE" in the last 168 hours. No results for input(s): "AMMONIA" in the last 168 hours. Coagulation Profile: Recent Labs  Lab 08/13/23 2017  INR 1.3*   Cardiac Enzymes: No results for input(s): "CKTOTAL", "CKMB", "CKMBINDEX", "TROPONINI" in the last 168 hours. BNP (last 3 results) No results for input(s): "PROBNP" in the last 8760 hours. HbA1C: No results for input(s): "HGBA1C" in the last 72 hours. CBG:  Recent Labs  Lab 08/16/23 1202 08/16/23 1650 08/16/23 1950 08/17/23 0049 08/17/23 0525  GLUCAP 174* 156* 137* 133* 104*   Lipid Profile: No results for input(s): "CHOL", "HDL", "LDLCALC", "TRIG", "CHOLHDL", "LDLDIRECT" in the last 72 hours. Thyroid Function Tests: No results for input(s): "TSH", "T4TOTAL", "FREET4", "T3FREE", "THYROIDAB" in the last 72 hours. Anemia Panel: No results for input(s): "VITAMINB12", "FOLATE", "FERRITIN", "TIBC", "IRON", "RETICCTPCT" in the last 72 hours. Sepsis Labs: Recent Labs  Lab 08/13/23 2017 08/13/23 2214  LATICACIDVEN 5.2* 2.1*    Recent Results (from the past 240 hours)  SARS Coronavirus 2 by RT PCR (hospital order, performed in [facility] hospital lab) *cepheid single result test* Anterior Nasal Swab     Status: None   Collection Time: 08/08/23  5:17 PM   Specimen: Anterior Nasal Swab  Result Value Ref Range Status   SARS Coronavirus 2 by RT PCR NEGATIVE NEGATIVE Final    Comment: (NOTE) SARS-CoV-2 target nucleic acids are NOT DETECTED.  The SARS-CoV-2 RNA is generally detectable in upper and lower respiratory specimens during the acute phase of infection. The lowest concentration of  SARS-CoV-2 viral copies this assay can detect is 250 copies / mL. A negative result does not preclude SARS-CoV-2 infection and should not be used as the sole basis for treatment or other patient management decisions.  A negative result may occur with improper specimen collection / handling, submission of specimen other than nasopharyngeal swab, presence of viral mutation(s) within the areas targeted by this assay, and inadequate number of viral copies (<250 copies / mL). A negative result must be combined with clinical observations, patient history, and epidemiological information.  Fact Sheet for Patients:   RoadLapTop.co.za  Fact Sheet for Healthcare Providers: http://kim-miller.com/  This test is not yet approved or  cleared by the [place]  FDA and has been authorized for detection and/or diagnosis of SARS-CoV-2 by FDA under an Emergency Use Authorization (EUA).  This EUA will remain in effect (meaning this test can be used) for the duration of the COVID-19 declaration under Section 564(b)(1) of the Act, 21 U.S.C. section 360bbb-3(b)(1), unless the authorization is terminated or revoked sooner.  Performed at [facility], [address], [place], [place] [address]   MRSA Next Gen by PCR, Nasal     Status: None   Collection Time: 08/08/23  8:18 PM   Specimen: Nasal Mucosa; Nasal Swab  Result Value Ref Range Status   MRSA by PCR Next Gen NOT DETECTED NOT DETECTED Final    Comment: (NOTE) The GeneXpert MRSA Assay (FDA approved for NASAL specimens only), is one component of a comprehensive MRSA colonization surveillance program. It is not intended to diagnose MRSA infection nor to guide or monitor treatment for MRSA infections. Test performance is not FDA approved in patients less than 76 years old. Performed at [facility], [address] [place], [place] [address]   MRSA Next Gen by PCR, Nasal     Status: None   Collection  Time: 08/13/23  7:24 PM   Specimen: Nasal Mucosa; Nasal Swab  Result Value Ref Range Status   MRSA by PCR Next Gen NOT DETECTED NOT DETECTED Final    Comment: (NOTE) The GeneXpert MRSA Assay (FDA approved for NASAL specimens only), is one component of a comprehensive MRSA colonization surveillance program. It is not intended to diagnose MRSA infection nor to guide or monitor treatment for MRSA infections. Test performance is not FDA approved in patients less than 44 years old. Performed at [facility], [address], [place], [place] [address]   Culture, Respiratory w Gram Stain     Status: None   Collection Time: 08/13/23  7:43 PM   Specimen: Tracheal Aspirate; Respiratory  Result Value Ref Range Status   Specimen Description TRACHEAL ASPIRATE  Final   Special Requests NONE  Final   Gram Stain   Final    ABUNDANT WBC PRESENT, PREDOMINANTLY PMN RARE GRAM POSITIVE COCCI IN PAIRS RARE GRAM NEGATIVE RODS Performed at [facility], [address], [place], [place] [address]    Culture FEW PSEUDOMONAS AERUGINOSA  Final   Report Status 08/16/2023 FINAL  Final   Organism ID, Bacteria PSEUDOMONAS AERUGINOSA  Final      Susceptibility   Pseudomonas aeruginosa - MIC*    CEFTAZIDIME >=64 RESISTANT Resistant     CIPROFLOXACIN <=0.25 SENSITIVE Sensitive     GENTAMICIN 2 SENSITIVE Sensitive     IMIPENEM 1 SENSITIVE Sensitive     CEFEPIME  RESISTANT Resistant     * FEW PSEUDOMONAS AERUGINOSA  Respiratory (~20 pathogens) panel by PCR     Status: None   Collection Time: 08/13/23  7:44 PM   Specimen: Nasopharyngeal Swab; Respiratory  Result Value Ref Range Status   Adenovirus NOT DETECTED NOT DETECTED Final   Coronavirus 229E NOT DETECTED NOT DETECTED Final    Comment: (NOTE) The Coronavirus on the Respiratory Panel, DOES NOT test for the novel  Coronavirus (2019 nCoV)    Coronavirus HKU1 NOT DETECTED NOT DETECTED Final   Coronavirus NL63 NOT DETECTED NOT DETECTED Final    Coronavirus OC43 NOT DETECTED NOT DETECTED Final   Metapneumovirus NOT DETECTED NOT DETECTED Final   Rhinovirus / Enterovirus NOT DETECTED NOT DETECTED Final   Influenza A NOT DETECTED NOT DETECTED Final   Influenza B NOT DETECTED NOT DETECTED Final   Parainfluenza Virus 1 NOT DETECTED NOT DETECTED Final   Parainfluenza Virus 2 NOT DETECTED NOT DETECTED Final   Parainfluenza Virus 3 NOT DETECTED NOT DETECTED Final   Parainfluenza Virus 4 NOT DETECTED NOT DETECTED Final   Respiratory Syncytial Virus NOT DETECTED NOT DETECTED Final   Bordetella pertussis NOT DETECTED NOT DETECTED Final   Bordetella Parapertussis NOT DETECTED NOT DETECTED Final   Chlamydophila pneumoniae NOT DETECTED NOT DETECTED Final   Mycoplasma pneumoniae NOT DETECTED NOT DETECTED Final    Comment: Performed at [facility], [address], [place], [place] [address]  Culture, blood (Routine X 2) w Reflex to ID Panel     Status: None (Preliminary result)   Collection Time: 08/15/23 10:22 AM   Specimen: BLOOD LEFT ARM  Result Value Ref Range Status   Specimen Description BLOOD LEFT ARM  Final   Special Requests   Final    BOTTLES DRAWN AEROBIC AND ANAEROBIC Blood Culture adequate volume   Culture   Final    NO GROWTH 2 DAYS Performed at [facility], [address], [place], [place] [address]    Report Status PENDING  Incomplete  Culture, blood (Routine X 2) w Reflex to ID Panel     Status: None (Preliminary result)   Collection Time: 08/15/23 10:40 AM   Specimen: BLOOD LEFT ARM  Result Value Ref Range Status   Specimen Description BLOOD LEFT ARM  Final   Special Requests   Final    BOTTLES DRAWN AEROBIC AND ANAEROBIC Blood Culture adequate volume   Culture   Final    NO GROWTH 2 DAYS Performed at  [facility], [address], [place], [place] [address]    Report Status PENDING  Incomplete         Radiology Studies: DG CHEST PORT 1 VIEW Result Date: 08/16/2023 CLINICAL DATA:  Acute  respiratory failure EXAM: PORTABLE CHEST 1 VIEW COMPARISON:  08/13/2023 FINDINGS: Cardiac shadow is enlarged. Vascular congestion is noted without significant interstitial edema. Right basilar atelectasis is noted new from the prior exam. Endotracheal tube and gastric catheter have been removed in the interval. IMPRESSION: Persistent vascular congestion with new right basilar atelectasis. Electronically Signed   By: Violeta Grey M.D.   On: 08/16/2023 09:26        Scheduled Meds:  apixaban   5 mg Oral BID   arformoterol   15 mcg Nebulization BID   atorvastatin   20 mg Oral q1800   bisoprolol   2.5 mg Oral Daily   budesonide  (PULMICORT ) nebulizer solution  0.5 mg Nebulization BID   busPIRone   7.5 mg Oral BID   Chlorhexidine  Gluconate Cloth  6 each Topical Daily   docusate sodium   100 mg Oral BID   doxycycline   100 mg Oral Q12H   furosemide   40 mg Oral BID   insulin  aspart  0-15 Units Subcutaneous Q4H   insulin  glargine-yfgn  6 Units Subcutaneous Daily   levofloxacin   750 mg Oral Q48H   methylPREDNISolone  (SOLU-MEDROL ) injection  40 mg Intravenous Q12H   multivitamin with minerals  1 tablet Oral Daily   nicotine   14 mg Transdermal Daily   mouth rinse  15 mL Mouth Rinse 4 times per day   pantoprazole   40 mg Oral QHS   polyethylene glycol  17 g Oral BID   revefenacin   175 mcg Nebulization Daily   senna  2 tablet Oral BID   Continuous Infusions:   LOS: 4 days    Time spent: 35 minutes    Robena Ewy A Jahrell Hamor, MD [facility]   If 7PM-7AM, please contact night-coverage [email or web address]  08/17/2023, 7:33 AM

## 2023-08-17 NOTE — Evaluation (Signed)
 Occupational Therapy Evaluation Patient Details Name: Brian Parsons MRN: [id number] DOB: March 21, 1951 Today's Date: 08/17/2023   History of Present Illness   73 y.o. male admitted 08/13/23 with COPD exacerbation. VDRF 4/28-4/30. PMHx: Recent admission [facility] 4/23-4/26/25. DVT, tobacco use, severe COPD on 4L, secondary polycythemia, chronic HFpEF, HTN, T2DM, nonobstructive CAD     Clinical Impressions Pt reports walking with either a cane or walker prior to admission. He was independent in self care and light meal prep and worked together with his fiance on housekeeping. Pt presents with generalized weakness, decreased activity tolerance, poor balance and impaired cognition. He needs +2 min assist for transfers and min to total assist for ADLs. Patient will benefit from continued inpatient follow up therapy, <3 hours/day.  If plan is discharge home, recommend the following:   Two people to help with walking and/or transfers;A lot of help with bathing/dressing/bathroom;Assistance with cooking/housework;Assistance with feeding;Assist for transportation;Help with stairs or ramp for entrance     Functional Status Assessment   Patient has had a recent decline in their functional status and  demonstrates the ability to make significant improvements in function in a reasonable and predictable amount of time.     Equipment Recommendations   Other (comment) (defer to next venue)     Recommendations for Other Services         Precautions/Restrictions   Precautions Precautions: Fall;Other (comment) Precaution/Restrictions Comments: watch SpO2 Restrictions Weight Bearing Restrictions Per Provider Order: No     Mobility Bed Mobility Overal bed mobility: Needs Assistance Bed Mobility: Supine to Sit     Supine to sit: Min assist, HOB elevated, Used rails     General bed mobility comments: pulled trunk up on therapist's hand    Transfers Overall transfer level: Needs assistance Equipment used: Rolling walker (2 wheels) Transfers: Sit to/from Stand, Bed to chair/wheelchair/BSC Sit to Stand: +2 physical assistance, Min assist     Step pivot transfers: Min assist, +2 safety/equipment     General transfer comment: assist to rise and steady, sits abruptly      Balance Overall balance assessment: Needs assistance   Sitting balance-Leahy Scale: Fair     Standing balance support: Bilateral upper extremity supported, Reliant on assistive device for balance, During functional activity Standing balance-Leahy Scale: Poor                             ADL either performed or assessed with clinical judgement   ADL Overall ADL's : Needs assistance/impaired Eating/Feeding: Set up;Sitting Eating/Feeding Details (indicate cue type and reason): assist to open containers Grooming: Wash/dry hands;Sitting;Set up   Upper Body Bathing: Moderate assistance;Sitting   Lower Body Bathing: Total assistance;Sit to/from stand   Upper Body Dressing : Minimal assistance;Sitting   Lower Body Dressing: Total assistance;Bed level   Toilet Transfer: Minimal assistance;+2 for physical assistance;Rolling walker (2 wheels)   Toileting- Clothing Manipulation and  Hygiene: Total assistance;Sit to/from stand               Vision Ability to See in Adequate Light: 0 Adequate Patient Visual Report: No change from baseline       Perception         Praxis         Pertinent Vitals/Pain Pain Assessment Pain Assessment: No/denies pain     Extremity/Trunk Assessment Upper Extremity Assessment Upper Extremity Assessment: Generalized weakness;Right hand dominant   Lower Extremity Assessment Lower Extremity Assessment: Defer to PT evaluation   Cervical / Trunk Assessment Cervical / Trunk Assessment: Kyphotic;Other exceptions (weakness)   Communication Communication Communication: Impaired Factors Affecting Communication: Hearing impaired   Cognition Arousal: Alert Behavior During Therapy: Flat affect Cognition: Difficult to assess Difficult to assess due to: Hard of hearing/deaf           OT - Cognition Comments: slow response speed, hearing likely contributing                 Following commands: Impaired Following commands impaired: Follows one step commands with increased time     Cueing  General Comments   Cueing Techniques: Verbal cues      Exercises     Shoulder Instructions      Home Living Family/patient expects  to be discharged to:: Private residence Living Arrangements: Spouse/significant other;Children Available Help at Discharge: Family;Available 24 hours/day Type of Home: Mobile home Home Access: Stairs to enter Entrance Stairs-Number of Steps: 4 Entrance Stairs-Rails: Right;Left Home Layout: One level     Bathroom Shower/Tub: Producer, television/film/video: Handicapped height     Home Equipment: Agricultural consultant (2 wheels);Cane - single point;Wheelchair - manual;Grab bars - tub/shower;Grab bars - toilet          Prior Functioning/Environment Prior Level of Function : Needs assist             Mobility Comments: walks in home with RW ADLs Comments: reports completing ADLs and  some cooking independently, fiancee shares IADLs    OT Problem List: Decreased strength;Decreased activity tolerance;Impaired balance (sitting and/or standing);Decreased coordination;Decreased cognition;Impaired UE functional use   OT Treatment/Interventions: Self-care/ADL training;Energy conservation;DME and/or AE instruction;Therapeutic activities;Cognitive remediation/compensation;Patient/family education;Balance training      OT Goals(Current goals can be found in the care plan section)   Acute Rehab OT Goals OT Goal Formulation: With patient Time For Goal Achievement: 08/31/23 Potential to Achieve Goals: Good ADL Goals Pt Will Perform Grooming: standing;with min assist Pt Will Perform Upper Body Bathing: with min assist;sitting Pt Will Perform Upper Body Dressing: with supervision;sitting Pt Will Transfer to Toilet: with contact guard assist;ambulating;bedside commode Pt Will Perform Toileting - Clothing Manipulation and hygiene: with contact guard assist;sit to/from stand Additional ADL Goal #1: Pt will complete bed mobility modified independently in preparation for ADLs.   OT Frequency:  Min 2X/week    Co-evaluation              AM-PAC OT "6 Clicks" Daily Activity     Outcome Measure Help from another person eating meals?: A Little Help from another person taking care of personal grooming?: A Little Help from another person toileting, which includes using toliet, bedpan, or urinal?: Total Help from another person bathing (including washing, rinsing, drying)?: A Lot Help from another person to put on and taking off regular upper body clothing?: A Little Help from another person to put on and taking off regular lower body clothing?: Total 6 Click Score: 13   End of Session Equipment Utilized During Treatment: Gait belt;Rolling walker (2 wheels);Oxygen  (4L) Nurse Communication: Mobility status;Other (comment) (ok to get pt an ensure)  Activity Tolerance: Patient  limited by fatigue Patient left: in chair;with call bell/phone within reach;with chair alarm set;with family/visitor present  OT Visit Diagnosis: Unsteadiness on feet (R26.81);Other abnormalities of gait and mobility (R26.89);Muscle weakness (generalized) (M62.81);Other (comment);Other symptoms and signs involving cognitive function (decreased activity tolerance)                Time: 1000-1025 OT Time Calculation (min): 25 min Charges:  OT General Charges $OT Visit: 1 Visit OT Evaluation $OT Eval Moderate Complexity: 1 Mod OT Treatments $Self Care/Home Management : 8-22 mins  Avanell Leigh, OTR/L Acute Rehabilitation Services Office: [phone]   Jonette Nestle 08/17/2023, 10:41 AM

## 2023-08-17 NOTE — TOC Progression Note (Addendum)
 Transition of Care [facility]) - Progression Note    Patient Details  Name: Brian Parsons MRN: [id number] Date of Birth: [birth year]  [facility] CM/SW Contact  Brian Parsons, [place] Phone Number: 08/17/2023, 9:45 AM  Clinical Narrative:   CSW provided medicare.gov ratings for SNF bed offer to patient at bedside. Patients family asked for CSW to expand search to [place]. CSW submitted referrals to SNFs in [place] at this time.  11:14 AM CSW spoke with Brian Parsons to review patient referral. [facility] (phone: [phone]) stated CSW will need to fax [place] referral to [phone].   Left VM for [facility] and [facility] admissions to review patient referral.   11:38 AM CSW faxed patient referral to [facility] in [place].   2:33 PM Patient, who is alert and oriented x3, stated he needs some time to decide on a facility. CSW reached out to patients son, Brian Parsons, to help assist in decision making. Brian Parsons stated he thinks [facility] in [place] would be a good choice for SNF. CSW will follow up with patient.   TOC will continue to follow.    Expected Discharge Plan: Skilled Nursing Facility Barriers to Discharge: Continued Medical Work up, SNF Pending bed offer, Insurance Authorization  Expected Discharge Plan and Services       Living arrangements for the past 2 months: Single Family Home                                       Social Determinants of Health (SDOH) Interventions SDOH Screenings   Food Insecurity: No Food Insecurity (08/16/2023)  Housing: Low Risk  (08/16/2023)  Transportation Needs: No Transportation Needs (08/16/2023)  Utilities: Not At Risk (08/16/2023)  Social Connections: Patient Declined (08/16/2023)  Tobacco Use: High Risk (08/16/2023)    Readmission Risk Interventions    08/08/2023   10:24 PM  Readmission Risk Prevention Plan  Post Dischage Appt Complete  Medication Screening Complete  Transportation Screening Complete

## 2023-08-17 NOTE — Progress Notes (Signed)
   08/17/23 2252  BiPAP/CPAP/SIPAP  BiPAP/CPAP/SIPAP Pt Type Adult  Reason BIPAP/CPAP not in use Non-compliant (Pt states he does not want to wear BIPAP)  BiPAP/CPAP /SiPAP Vitals  Pulse Rate 100  Resp 17  SpO2 93 %  Bilateral Breath Sounds Diminished;Coarse crackles  MEWS Score/Color  MEWS Score 0  MEWS Score Color Marrie Sizer

## 2023-08-18 DIAGNOSIS — J9601 Acute respiratory failure with hypoxia: Secondary | ICD-10-CM | POA: Diagnosis not present

## 2023-08-18 LAB — BLOOD GAS, ARTERIAL
Acid-Base Excess: 30.1 mmol/L — ABNORMAL HIGH (ref 0.0–2.0)
Bicarbonate: 59.3 mmol/L — ABNORMAL HIGH (ref 20.0–28.0)
O2 Saturation: 83.5 %
Patient temperature: 37
pCO2 arterial: 76 mmHg (ref 32–48)
pH, Arterial: 7.5 — ABNORMAL HIGH (ref 7.35–7.45)
pO2, Arterial: 50 mmHg — ABNORMAL LOW (ref 83–108)

## 2023-08-18 LAB — CBC
HCT: 54.9 % — ABNORMAL HIGH (ref 39.0–52.0)
Hemoglobin: 17.2 g/dL — ABNORMAL HIGH (ref 13.0–17.0)
MCH: 29.7 pg (ref 26.0–34.0)
MCHC: 31.3 g/dL (ref 30.0–36.0)
MCV: 94.7 fL (ref 80.0–100.0)
Platelets: 165 10*3/uL (ref 150–400)
RBC: 5.8 MIL/uL (ref 4.22–5.81)
RDW: 14.6 % (ref 11.5–15.5)
WBC: 13.9 10*3/uL — ABNORMAL HIGH (ref 4.0–10.5)
nRBC: 0 % (ref 0.0–0.2)

## 2023-08-18 LAB — BASIC METABOLIC PANEL WITH GFR
Anion gap: 14 (ref 5–15)
BUN: 83 mg/dL — ABNORMAL HIGH (ref 8–23)
CO2: 44 mmol/L — ABNORMAL HIGH (ref 22–32)
Calcium: 9.9 mg/dL (ref 8.9–10.3)
Chloride: 82 mmol/L — ABNORMAL LOW (ref 98–111)
Creatinine, Ser: 1.4 mg/dL — ABNORMAL HIGH (ref 0.61–1.24)
GFR, Estimated: 53 mL/min — ABNORMAL LOW (ref >60)
Glucose, Bld: 136 mg/dL — ABNORMAL HIGH (ref 70–99)
Potassium: 4.8 mmol/L (ref 3.5–5.1)
Sodium: 140 mmol/L (ref 135–145)

## 2023-08-18 LAB — GLUCOSE, CAPILLARY
Glucose-Capillary: 140 mg/dL — ABNORMAL HIGH (ref 70–99)
Glucose-Capillary: 128 mg/dL — ABNORMAL HIGH (ref 70–99)
Glucose-Capillary: 123 mg/dL — ABNORMAL HIGH (ref 70–99)
Glucose-Capillary: 93 mg/dL (ref 70–99)

## 2023-08-18 MED ORDER — METHYLPREDNISOLONE SODIUM SUCC 40 MG IJ SOLR: 40.0000 mg | INTRAMUSCULAR | Status: DC

## 2023-08-18 MED ORDER — HYDRALAZINE HCL 20 MG/ML IJ SOLN: 5.0000 mg | Freq: Four times a day (QID) | INTRAMUSCULAR | Status: DC | PRN

## 2023-08-18 NOTE — Progress Notes (Signed)
 PROGRESS NOTE    Brian Parsons  ZOX:[id number] DOB: 1950-05-03 DOA: 08/13/2023 PCP: Jonah Negus, NP   Brief Narrative: 73 year old with past medical history significant for COPD on 2 L nasal cannula, chronic heart failure preserved ejection fraction, hypertension, diabetes type 2, A-flutter on Eliquis , CAD presents to [facility] 4/28 with COPD exacerbation.  Patient was recently admitted to [facility] from 423 and discharged 426 on Doxy and prednisone .  He developed worsening shortness of breath and hypoxia and on 4/28 he was taken  to [facility] health.  On arrival patient was placed on BiPAP, no improvement of ABG on BiPAP and he required intubation.  He became hypotensive and required IV fluids and IV pressors.  He was admitted by CCM on 4/28 intubated.  Patient was extubated 4/30.  Care transferred to [facility] 5/2    Assessment & Plan:   Principal Problem:   Acute respiratory failure with hypoxia (HCC) Active Problems:   Malnutrition of moderate degree   Pressure injury of skin   1-Acute Hypoxic and Hypercapnia respiratory failure, on chronic 3 L at home COPD exacerbation versus CHF exacerbation - Patient recently  admitted  at [facility] treated for COPD exacerbation and CAP. Aaron AasBiPAP as needed .Continue Pulmicort , Brovana , Yupelri . Add albuterol / Guaifenesin  and flutter valve -I.V steroids -On Levaquin  to cover for Pseudomonas. -Continue with steroids, change to daily to avoid worsening agitation.  -contiue BIPAP> agitated today, blood gas was mixt venous, arterial. PCO 2 elevated but PH compensated, BIPAP to assist with tachypnea, and see if would help with his confusion.   Sepsis, POA: in setting pseudomonas PNA Required IV pressors, leukocytosis, lactic acidosis at 5, sputum culture grew Pseudomonas. Chest x ray pulmonary atelectasis.  Continue with Levaquin .    Elevated troponin and abnormal BNP, likely demand ischemia Echo ejection fraction 40 to 45%, mild  pulmonary hypertension mild reduced RV systolic function Continue with Lasix   AKI on CKD 3B:  -Presents with Cr 2.6--2.4 - Creatinine baseline 1.4 Suspect in the setting of hypotension, sepsis, could be ATN Renal function improving. Cr down to 1.6 Monitor on Lasix   Diabetes type 2 Hyperglycemia - Continue with Symlin 6 units daily, NovoLog  3 times a day with meals  Acute systolic heart failure, history of chronic diastolic heart failure Hypertension CAD - Continue with  Lasix  - Ejection fraction down to 40 to 45% on 2D echo/25.  Cardiology was aware during last hospitalization. - Mild chronic hypertension with moderate resolution of RV systolic function -Follow-up with cardiology as an outpatient  Recent atrial flutter: He was started on Eliquis  by cardiology, continue Eliquis .  Resume bisoprolol .  Amiodarone  was discontinued in the ICU  Acute Metabolic Encephalopathy, suspect related to hypercapnia. BIPAP/   Tobacco abuse Smoking cessation  Moderate malnutrition On supplements  Pressure injury  See wound care documentation below  Pressure Injury 08/13/23 Coccyx Medial Stage 2 -  Partial thickness loss of dermis presenting as a shallow open injury with a red, pink wound bed without slough. (Active)  08/13/23 1920  Location: Coccyx  Location Orientation: Medial  Staging: Stage 2 -  Partial thickness loss of dermis presenting as a shallow open injury with a red, pink wound bed without slough.  Wound Description (Comments):   Present on Admission: Yes  Dressing Type Foam - Lift dressing to assess site every shift 08/18/23 1116     Nutrition Problem: Moderate Malnutrition Etiology: chronic illness (COPD)    Signs/Symptoms: mild fat depletion, mild muscle depletion  Interventions: Prostat, Tube feeding, MVI  Estimated body mass index is 30.87 kg/m as calculated from the following:   Height as of this encounter: 5\' 7"  (1.702 m).   Weight as of this encounter:  89.4 kg.   DVT prophylaxis: Eliquis  Code Status: Full code Family Communication: Discussed with patient Disposition Plan:  Status is: Inpatient Remains inpatient appropriate because: He agreed to go to a rehab, suspect will be ready 1 or 2 days    Consultants:  CCM admitted patient  Procedures:  4/30 extubated   Antimicrobials:    Subjective: Confused and agitated today, PCO2 elevated. Placed on BIPAP>   Objective: Vitals:   08/18/23 1220 08/18/23 1226 08/18/23 1600 08/18/23 1648  BP: (!) 151/82   (!) 133/100  Pulse: 67     Resp: (!) 22 20    Temp: 97.9 F (36.6 C)     TempSrc: Oral     SpO2: 100% 100% 100%   Weight:      Height:        Intake/Output Summary (Last 24 hours) at 08/18/2023 1708 Last data filed at 08/18/2023 1226 Gross per 24 hour  Intake 300 ml  Output 3525 ml  Net -3225 ml   Filed Weights   08/15/23 0500 08/17/23 0527 08/18/23 0500  Weight: 83.6 kg 90.3 kg 89.4 kg    Examination:  General exam: Confused Respiratory system:  BL ronchus.  Cardiovascular system: S 1, S 2 RRR Gastrointestinal system: BS present, soft, nt Central nervous system: alert Extremities: no edema    Data Reviewed: I have personally reviewed following labs and imaging studies  CBC: Recent Labs  Lab 08/13/23 2017 08/13/23 2024 08/14/23 0311 08/15/23 0345 08/16/23 0347 08/17/23 0338 08/18/23 0334  WBC 15.3*  --  9.3 12.6* 18.6* 12.2* 13.9*  NEUTROABS 13.1*  --   --   --   --   --   --   HGB 17.1*   < > 10.1* 16.5 15.8 16.0 17.2*  HCT 55.8*   < > 33.5* 51.7 51.2 51.5 54.9*  MCV 98.4  --  98.2 92.2 96.2 95.4 94.7  PLT 132*  --  147* 162 165 138* 165   < > = values in this interval not displayed.   Basic Metabolic Panel: Recent Labs  Lab 08/13/23 2017 08/13/23 2024 08/14/23 0555 08/15/23 0345 08/16/23 0347 08/17/23 0338 08/18/23 0334  NA 139   < > 138 139 137 137 140  K 4.7   < > 3.6 3.9 3.8 4.9 4.8  CL 79*  --  80* 85* 82* 80* 82*  CO2 35*  --   39* 39* 40* 43* 44*  GLUCOSE 221*  --  322* 214* 171* 103* 136*  BUN 121*  --  121* 134* 120* 101* 83*  CREATININE 2.63*  --  2.41* 2.20* 1.78* 1.63* 1.40*  CALCIUM  9.0  --  9.2 9.1 9.3 9.5 9.9  MG 2.2  --   --  2.3 2.2  --   --   PHOS  --   --   --  1.9* 4.0  --   --    < > = values in this interval not displayed.   GFR: Estimated Creatinine Clearance: 49.4 mL/min (A) (by C-G formula based on SCr of 1.4 mg/dL (H)). Liver Function Tests: Recent Labs  Lab 08/13/23 2017  AST 31  ALT 22  ALKPHOS 70  BILITOT 2.8*  PROT 5.8*  ALBUMIN 2.8*   No results for input(s): "LIPASE", "AMYLASE"  in the last 168 hours. No results for input(s): "AMMONIA" in the last 168 hours. Coagulation Profile: Recent Labs  Lab 08/13/23 2017  INR 1.3*   Cardiac Enzymes: No results for input(s): "CKTOTAL", "CKMB", "CKMBINDEX", "TROPONINI" in the last 168 hours. BNP (last 3 results) No results for input(s): "PROBNP" in the last 8760 hours. HbA1C: No results for input(s): "HGBA1C" in the last 72 hours. CBG: Recent Labs  Lab 08/17/23 1616 08/17/23 2047 08/18/23 0607 08/18/23 1249 08/18/23 1630  GLUCAP 182* 164* 140* 128* 123*   Lipid Profile: No results for input(s): "CHOL", "HDL", "LDLCALC", "TRIG", "CHOLHDL", "LDLDIRECT" in the last 72 hours. Thyroid Function Tests: No results for input(s): "TSH", "T4TOTAL", "FREET4", "T3FREE", "THYROIDAB" in the last 72 hours. Anemia Panel: No results for input(s): "VITAMINB12", "FOLATE", "FERRITIN", "TIBC", "IRON", "RETICCTPCT" in the last 72 hours. Sepsis Labs: Recent Labs  Lab 08/13/23 2017 08/13/23 2214  LATICACIDVEN 5.2* 2.1*    Recent Results (from the past 240 hours)  SARS Coronavirus 2 by RT PCR (hospital order, performed in [facility] hospital lab) *cepheid single result test* Anterior Nasal Swab     Status: None   Collection Time: 08/08/23  5:17 PM   Specimen: Anterior Nasal Swab  Result Value Ref Range Status   SARS Coronavirus 2 by RT PCR  NEGATIVE NEGATIVE Final    Comment: (NOTE) SARS-CoV-2 target nucleic acids are NOT DETECTED.  The SARS-CoV-2 RNA is generally detectable in upper and lower respiratory specimens during the acute phase of infection. The lowest concentration of SARS-CoV-2 viral copies this assay can detect is 250 copies / mL. A negative result does not preclude SARS-CoV-2 infection and should not be used as the sole basis for treatment or other patient management decisions.  A negative result may occur with improper specimen collection / handling, submission of specimen other than nasopharyngeal swab, presence of viral mutation(s) within the areas targeted by this assay, and inadequate number of viral copies (<250 copies / mL). A negative result must be combined with clinical observations, patient history, and epidemiological information.  Fact Sheet for Patients:   RoadLapTop.co.za  Fact Sheet for Healthcare Providers: http://kim-miller.com/  This test is not yet approved or  cleared by the [place]  FDA and has been authorized for detection and/or diagnosis of SARS-CoV-2 by FDA under an Emergency Use Authorization (EUA).  This EUA will remain in effect (meaning this test can be used) for the duration of the COVID-19 declaration under Section 564(b)(1) of the Act, 21 U.S.C. section 360bbb-3(b)(1), unless the authorization is terminated or revoked sooner.  Performed at [facility], [address] [place], [place] [address]   MRSA Next Gen by PCR, Nasal     Status: None   Collection Time: 08/08/23  8:18 PM   Specimen: Nasal Mucosa; Nasal Swab  Result Value Ref Range Status   MRSA by PCR Next Gen NOT DETECTED NOT DETECTED Final    Comment: (NOTE) The GeneXpert MRSA Assay (FDA approved for NASAL specimens only), is one component of a comprehensive MRSA colonization surveillance program. It is not intended to diagnose MRSA infection nor to guide or  monitor treatment for MRSA infections. Test performance is not FDA approved in patients less than 50 years old. Performed at [facility], [address], [place], [place] [address]   MRSA Next Gen by PCR, Nasal     Status: None   Collection Time: 08/13/23  7:24 PM   Specimen: Nasal Mucosa; Nasal Swab  Result Value Ref Range Status  MRSA by PCR Next Gen NOT DETECTED NOT DETECTED Final    Comment: (NOTE) The GeneXpert MRSA Assay (FDA approved for NASAL specimens only), is one component of a comprehensive MRSA colonization surveillance program. It is not intended to diagnose MRSA infection nor to guide or monitor treatment for MRSA infections. Test performance is not FDA approved in patients less than 47 years old. Performed at [facility], [address], [place], [place] [address]   Culture, Respiratory w Gram Stain     Status: None   Collection Time: 08/13/23  7:43 PM   Specimen: Tracheal Aspirate; Respiratory  Result Value Ref Range Status   Specimen Description TRACHEAL ASPIRATE  Final   Special Requests NONE  Final   Gram Stain   Final    ABUNDANT WBC PRESENT, PREDOMINANTLY PMN RARE GRAM POSITIVE COCCI IN PAIRS RARE GRAM NEGATIVE RODS Performed at [facility], [address] [place], [place] [address]    Culture FEW PSEUDOMONAS AERUGINOSA  Final   Report Status 08/16/2023 FINAL  Final   Organism ID, Bacteria PSEUDOMONAS AERUGINOSA  Final      Susceptibility   Pseudomonas aeruginosa - MIC*    CEFTAZIDIME >=64 RESISTANT Resistant     CIPROFLOXACIN <=0.25 SENSITIVE Sensitive     GENTAMICIN 2 SENSITIVE Sensitive     IMIPENEM 1 SENSITIVE Sensitive     CEFEPIME  RESISTANT Resistant     * FEW PSEUDOMONAS AERUGINOSA  Respiratory (~20 pathogens) panel by PCR     Status: None   Collection Time: 08/13/23  7:44 PM   Specimen: Nasopharyngeal Swab; Respiratory  Result Value Ref Range Status   Adenovirus NOT DETECTED NOT DETECTED Final   Coronavirus 229E NOT DETECTED  NOT DETECTED Final    Comment: (NOTE) The Coronavirus on the Respiratory Panel, DOES NOT test for the novel  Coronavirus (2019 nCoV)    Coronavirus HKU1 NOT DETECTED NOT DETECTED Final   Coronavirus NL63 NOT DETECTED NOT DETECTED Final   Coronavirus OC43 NOT DETECTED NOT DETECTED Final   Metapneumovirus NOT DETECTED NOT DETECTED Final   Rhinovirus / Enterovirus NOT DETECTED NOT DETECTED Final   Influenza A NOT DETECTED NOT DETECTED Final   Influenza B NOT DETECTED NOT DETECTED Final   Parainfluenza Virus 1 NOT DETECTED NOT DETECTED Final   Parainfluenza Virus 2 NOT DETECTED NOT DETECTED Final   Parainfluenza Virus 3 NOT DETECTED NOT DETECTED Final   Parainfluenza Virus 4 NOT DETECTED NOT DETECTED Final   Respiratory Syncytial Virus NOT DETECTED NOT DETECTED Final   Bordetella pertussis NOT DETECTED NOT DETECTED Final   Bordetella Parapertussis NOT DETECTED NOT DETECTED Final   Chlamydophila pneumoniae NOT DETECTED NOT DETECTED Final   Mycoplasma pneumoniae NOT DETECTED NOT DETECTED Final    Comment: Performed at [facility], [address], [place], [place] [address]  Culture, blood (Routine X 2) w Reflex to ID Panel     Status: None (Preliminary result)   Collection Time: 08/15/23 10:22 AM   Specimen: BLOOD LEFT ARM  Result Value Ref Range Status   Specimen Description BLOOD LEFT ARM  Final   Special Requests   Final    BOTTLES DRAWN AEROBIC AND ANAEROBIC Blood Culture adequate volume   Culture   Final    NO GROWTH 3 DAYS Performed at [facility], [address], [place], [place] [address]    Report Status PENDING  Incomplete  Culture, blood (Routine X 2) w Reflex to ID Panel  Status: None (Preliminary result)   Collection Time: 08/15/23 10:40 AM   Specimen: BLOOD LEFT ARM  Result Value Ref Range Status   Specimen Description BLOOD LEFT ARM  Final   Special Requests   Final    BOTTLES DRAWN AEROBIC AND ANAEROBIC Blood Culture adequate volume   Culture    Final    NO GROWTH 3 DAYS Performed at [facility], [address], [place], [place] [address]    Report Status PENDING  Incomplete         Radiology Studies: No results found.       Scheduled Meds:  apixaban   5 mg Oral BID   arformoterol   15 mcg Nebulization BID   atorvastatin   20 mg Oral q1800   bisoprolol   2.5 mg Oral Daily   budesonide  (PULMICORT ) nebulizer solution  0.5 mg Nebulization BID   busPIRone   7.5 mg Oral BID   Chlorhexidine  Gluconate Cloth  6 each Topical Daily   docusate sodium   100 mg Oral BID   furosemide   40 mg Oral BID   guaiFENesin   1,200 mg Oral BID   insulin  aspart  0-15 Units Subcutaneous TID WC   insulin  glargine-yfgn  6 Units Subcutaneous Daily   levofloxacin   750 mg Oral Q48H   [START ON 08/19/2023] methylPREDNISolone  (SOLU-MEDROL ) injection  40 mg Intravenous Q24H   multivitamin with minerals  1 tablet Oral Daily   nicotine   14 mg Transdermal Daily   mouth rinse  15 mL Mouth Rinse 4 times per day   pantoprazole   40 mg Oral QHS   polyethylene glycol  17 g Oral BID   revefenacin   175 mcg Nebulization Daily   senna  2 tablet Oral BID   Continuous Infusions:   LOS: 5 days    Time spent: 35 minutes    Timisha Mondry A Markeita Alicia, MD [facility]   If 7PM-7AM, please contact night-coverage [email or web address]  08/18/2023, 5:08 PM

## 2023-08-18 NOTE — Progress Notes (Addendum)
 Bed side shift report, Patient confused, A/O to self only. At the edge of bed refusing to get back in bed or helped to the chair. RN tries to reorient patient unsuccessfully. Patient very addiment he needs to leave and go get his meds. Refuses this RN to give him any meds. MD notified. See orders. Tried to call son but [facility]. Will continue to monitor and reorient patient.   6045 was able to speak to [facility] and she was unable to reorient patient as well. Pt still resisting care and trying to get up at this time. Respiratory was notified about ABG being ordered.

## 2023-08-18 NOTE — Progress Notes (Signed)
 RT reminded x 2 to come and place patient on the BIPAP. She came and apply but patient refuse.

## 2023-08-18 NOTE — Plan of Care (Signed)
  Problem: Elimination: Goal: Will not experience complications related to urinary retention Outcome: Progressing   Problem: Pain Managment: Goal: General experience of comfort will improve and/or be controlled Outcome: Progressing   Problem: Education: Goal: Knowledge of General Education information will improve Description: Including pain rating scale, medication(s)/side effects and non-pharmacologic comfort measures Outcome: Not Progressing   Problem: Clinical Measurements: Goal: Will remain free from infection Outcome: Not Progressing Goal: Respiratory complications will improve Outcome: Not Progressing Goal: Cardiovascular complication will be avoided Outcome: Not Progressing   Problem: Activity: Goal: Risk for activity intolerance will decrease Outcome: Not Progressing   Problem: Coping: Goal: Level of anxiety will decrease Outcome: Not Progressing   Problem: Skin Integrity: Goal: Risk for impaired skin integrity will decrease Outcome: Not Progressing

## 2023-08-18 NOTE — Progress Notes (Signed)
   08/18/23 0147  BiPAP/CPAP/SIPAP  $ Non-Invasive Ventilator  Non-Invasive Vent Initial  $ Face Mask Small Yes  BiPAP/CPAP/SIPAP Pt Type Adult  BiPAP/CPAP/SIPAP DREAMSTATIOND  Mask Type Full face mask  Mask Size Small  IPAP 10 cmH20  EPAP 6 cmH2O  Flow Rate 4 lpm  Patient Home Machine No  Patient Home Mask No  Patient Home Tubing No  Auto Titrate No  Device Plugged into RED Power Outlet Yes  BiPAP/CPAP /SiPAP Vitals  Resp 18  Bilateral Breath Sounds Diminished;Coarse crackles  MEWS Score/Color  MEWS Score 0  MEWS Score Color Green

## 2023-08-18 NOTE — TOC Progression Note (Signed)
 Transition of Care [facility]) - Progression Note    Patient Details  Name: Brian Parsons MRN: [id number] Date of Birth: 05-14-1950  [facility] CM/SW Contact  Kambryn Dapolito [place], [place] Phone Number: 08/18/2023, 3:29 PM  Clinical Narrative:     CSW met with patient at bedside to review SNF bed offers. Patient continues to review bed offers, has not made a bed choice at this time. Requested a return visit on 08/19/23.  Transition of Care to continue to follow  Christabella Alvira, LCSW Transition of Care  Expected Discharge Plan: Skilled Nursing Facility Barriers to Discharge: Continued Medical Work up, SNF Pending bed offer, Insurance Authorization  Expected Discharge Plan and Services       Living arrangements for the past 2 months: Single Family Home                                       Social Determinants of Health (SDOH) Interventions SDOH Screenings   Food Insecurity: No Food Insecurity (08/16/2023)  Housing: Low Risk  (08/16/2023)  Transportation Needs: No Transportation Needs (08/16/2023)  Utilities: Not At Risk (08/16/2023)  Social Connections: Patient Declined (08/16/2023)  Tobacco Use: High Risk (08/16/2023)    Readmission Risk Interventions    08/08/2023   10:24 PM  Readmission Risk Prevention Plan  Post Dischage Appt Complete  Medication Screening Complete  Transportation Screening Complete

## 2023-08-18 NOTE — Progress Notes (Signed)
   08/18/23 0905  BiPAP/CPAP/SIPAP  BiPAP/CPAP/SIPAP Pt Type Adult  BiPAP/CPAP/SIPAP DREAMSTATIOND  Mask Type Full face mask  Mask Size Small  IPAP 10 cmH20  EPAP 6 cmH2O  Flow Rate 4 lpm   PT is tolerating ok at this time. RN and MD at bedside to witness.

## 2023-08-19 DIAGNOSIS — J9601 Acute respiratory failure with hypoxia: Secondary | ICD-10-CM | POA: Diagnosis not present

## 2023-08-19 LAB — BASIC METABOLIC PANEL WITH GFR
Anion gap: 15 (ref 5–15)
BUN: 72 mg/dL — ABNORMAL HIGH (ref 8–23)
CO2: 45 mmol/L — ABNORMAL HIGH (ref 22–32)
Calcium: 9.8 mg/dL (ref 8.9–10.3)
Chloride: 80 mmol/L — ABNORMAL LOW (ref 98–111)
Creatinine, Ser: 1.51 mg/dL — ABNORMAL HIGH (ref 0.61–1.24)
GFR, Estimated: 48 mL/min — ABNORMAL LOW (ref >60)
Glucose, Bld: 109 mg/dL — ABNORMAL HIGH (ref 70–99)
Potassium: 4.4 mmol/L (ref 3.5–5.1)
Sodium: 140 mmol/L (ref 135–145)

## 2023-08-19 LAB — GLUCOSE, CAPILLARY
Glucose-Capillary: 91 mg/dL (ref 70–99)
Glucose-Capillary: 93 mg/dL (ref 70–99)
Glucose-Capillary: 246 mg/dL — ABNORMAL HIGH (ref 70–99)
Glucose-Capillary: 195 mg/dL — ABNORMAL HIGH (ref 70–99)

## 2023-08-19 LAB — CBC
HCT: 55.4 % — ABNORMAL HIGH (ref 39.0–52.0)
Hemoglobin: 17 g/dL (ref 13.0–17.0)
MCH: 29.6 pg (ref 26.0–34.0)
MCHC: 30.7 g/dL (ref 30.0–36.0)
MCV: 96.5 fL (ref 80.0–100.0)
Platelets: 139 10*3/uL — ABNORMAL LOW (ref 150–400)
RBC: 5.74 MIL/uL (ref 4.22–5.81)
RDW: 14.4 % (ref 11.5–15.5)
WBC: 12.8 10*3/uL — ABNORMAL HIGH (ref 4.0–10.5)
nRBC: 0.2 % (ref 0.0–0.2)

## 2023-08-19 MED ORDER — PREDNISONE 20 MG PO TABS
40.0000 mg | ORAL_TABLET | Freq: Every day | ORAL | Status: DC
  Administered 2023-08-19 – 2023-08-21 (×3): 40 mg via ORAL
  Filled 2023-08-19 (×3): qty 2

## 2023-08-19 NOTE — Progress Notes (Signed)
 PROGRESS NOTE    Brian Parsons  ZOX:[id number] DOB: 1950/05/02 DOA: 08/13/2023 PCP: Jonah Negus, NP   Brief Narrative: 73 year old with past medical history significant for COPD on 2 L nasal cannula, chronic heart failure preserved ejection fraction, hypertension, diabetes type 2, A-flutter on Eliquis , CAD presents to [facility] 4/28 with COPD exacerbation.  Patient was recently admitted to [facility] from 423 and discharged 426 on Doxy and prednisone .  He developed worsening shortness of breath and hypoxia and on 4/28 he was taken  to [facility] health.  On arrival patient was placed on BiPAP, no improvement of ABG on BiPAP and he required intubation.  He became hypotensive and required IV fluids and IV pressors.  He was admitted by CCM on 4/28 intubated.  Patient was extubated 4/30.  Care transferred to [facility] 5/2    Assessment & Plan:   Principal Problem:   Acute respiratory failure with hypoxia (HCC) Active Problems:   Malnutrition of moderate degree   Pressure injury of skin   1-Acute Hypoxic and Hypercapnia respiratory failure, on chronic 3 L at home COPD exacerbation versus CHF exacerbation - Patient recently  admitted  at [facility] treated for COPD exacerbation and CAP. Aaron AasBiPAP as needed .Continue Pulmicort , Brovana , Yupelri . Add albuterol / Guaifenesin  and flutter valve -I.V steroids----change to prednisone  today  -On Levaquin  to cover for Pseudomonas. -Continue with steroids, change to daily to avoid worsening agitation.  -Contiue BIPAP> agitated on 5/03.  blood gas was mixt venous, arterial. PCO 2 elevated but PH compensated, BIPAP to assist with tachypnea. Stable today.   Sepsis, POA: in setting pseudomonas PNA Required IV pressors, leukocytosis, lactic acidosis at 5, sputum culture grew Pseudomonas. Chest x ray pulmonary atelectasis.  Continue with Levaquin .    Elevated troponin and abnormal BNP, likely demand ischemia Echo ejection fraction 40 to 45%,  mild pulmonary hypertension mild reduced RV systolic function Continue with Lasix   AKI on CKD 3B:  -Presents with Cr 2.6--2.4 - Creatinine baseline 1.4 Suspect in the setting of hypotension, sepsis, could be ATN Renal function improving. Cr down to 1.6 Monitor on Lasix   Diabetes type 2 Hyperglycemia - Continue with Symlin 6 units daily, NovoLog  3 times a day with meals  Acute systolic heart failure, history of chronic diastolic heart failure Hypertension CAD - Continue with  Lasix  - Ejection fraction down to 40 to 45% on 2D echo/25.  Cardiology was aware during last hospitalization. - Mild chronic hypertension with moderate resolution of RV systolic function -Follow-up with cardiology as an outpatient -Negative 8 L.   Recent atrial flutter: He was started on Eliquis  by cardiology, continue Eliquis .  Resume bisoprolol .  Amiodarone  was discontinued in the ICU  Acute Metabolic Encephalopathy, suspect related to hypercapnia. BIPAP/  He is more calm today.  Has sitter  Tobacco abuse Smoking cessation  Moderate malnutrition On supplements  Pressure injury  See wound care documentation below  Pressure Injury 08/13/23 Coccyx Medial Stage 2 -  Partial thickness loss of dermis presenting as a shallow open injury with a red, pink wound bed without slough. (Active)  08/13/23 1920  Location: Coccyx  Location Orientation: Medial  Staging: Stage 2 -  Partial thickness loss of dermis presenting as a shallow open injury with a red, pink wound bed without slough.  Wound Description (Comments):   Present on Admission: Yes  Dressing Type Foam - Lift dressing to assess site every shift 08/18/23 1116     Nutrition Problem: Moderate Malnutrition Etiology: chronic illness (COPD)  Signs/Symptoms: mild fat depletion, mild muscle depletion    Interventions: Prostat, Tube feeding, MVI  Estimated body mass index is 29.45 kg/m as calculated from the following:   Height as of this  encounter: 5\' 7"  (1.702 m).   Weight as of this encounter: 85.3 kg.   DVT prophylaxis: Eliquis  Code Status: Full code Family Communication: Discussed with patient Disposition Plan:  Status is: Inpatient Remains inpatient appropriate because: He agreed to go to a rehab, suspect will be ready 1 or 2 days    Consultants:  CCM admitted patient  Procedures:  4/30 extubated   Antimicrobials:    Subjective: He is alert, calmer today. Oriented to person. We talk importance of using BIPAP>  Breathing ok Objective: Vitals:   08/19/23 0131 08/19/23 0456 08/19/23 0528 08/19/23 0708  BP: 107/74 137/85  120/84  Pulse: 89 96  88  Resp:    18  Temp: (!) 96.9 F (36.1 C) (!) 97.5 F (36.4 C)  (!) 97.5 F (36.4 C)  TempSrc: Axillary Oral  Oral  SpO2: 91% 96%  100%  Weight:   85.3 kg   Height:        Intake/Output Summary (Last 24 hours) at 08/19/2023 0733 Last data filed at 08/19/2023 0528 Gross per 24 hour  Intake 480 ml  Output 2700 ml  Net -2220 ml   Filed Weights   08/17/23 0527 08/18/23 0500 08/19/23 0528  Weight: 90.3 kg 89.4 kg 85.3 kg    Examination:  General exam: alert Respiratory system:  BL ronchus Cardiovascular system: S 1. S 2 RRR Gastrointestinal system: BS present, soft, nt Central nervous system: Alert. Follows command Extremities: no edema    Data Reviewed: I have personally reviewed following labs and imaging studies  CBC: Recent Labs  Lab 08/13/23 2017 08/13/23 2024 08/14/23 0311 08/15/23 0345 08/16/23 0347 08/17/23 0338 08/18/23 0334  WBC 15.3*  --  9.3 12.6* 18.6* 12.2* 13.9*  NEUTROABS 13.1*  --   --   --   --   --   --   HGB 17.1*   < > 10.1* 16.5 15.8 16.0 17.2*  HCT 55.8*   < > 33.5* 51.7 51.2 51.5 54.9*  MCV 98.4  --  98.2 92.2 96.2 95.4 94.7  PLT 132*  --  147* 162 165 138* 165   < > = values in this interval not displayed.   Basic Metabolic Panel: Recent Labs  Lab 08/13/23 2017 08/13/23 2024 08/14/23 0555 08/15/23 0345  08/16/23 0347 08/17/23 0338 08/18/23 0334  NA 139   < > 138 139 137 137 140  K 4.7   < > 3.6 3.9 3.8 4.9 4.8  CL 79*  --  80* 85* 82* 80* 82*  CO2 35*  --  39* 39* 40* 43* 44*  GLUCOSE 221*  --  322* 214* 171* 103* 136*  BUN 121*  --  121* 134* 120* 101* 83*  CREATININE 2.63*  --  2.41* 2.20* 1.78* 1.63* 1.40*  CALCIUM  9.0  --  9.2 9.1 9.3 9.5 9.9  MG 2.2  --   --  2.3 2.2  --   --   PHOS  --   --   --  1.9* 4.0  --   --    < > = values in this interval not displayed.   GFR: Estimated Creatinine Clearance: 48.3 mL/min (A) (by C-G formula based on SCr of 1.4 mg/dL (H)). Liver Function Tests: Recent Labs  Lab 08/13/23 2017  AST  31  ALT 22  ALKPHOS 70  BILITOT 2.8*  PROT 5.8*  ALBUMIN 2.8*   No results for input(s): "LIPASE", "AMYLASE" in the last 168 hours. No results for input(s): "AMMONIA" in the last 168 hours. Coagulation Profile: Recent Labs  Lab 08/13/23 2017  INR 1.3*   Cardiac Enzymes: No results for input(s): "CKTOTAL", "CKMB", "CKMBINDEX", "TROPONINI" in the last 168 hours. BNP (last 3 results) No results for input(s): "PROBNP" in the last 8760 hours. HbA1C: No results for input(s): "HGBA1C" in the last 72 hours. CBG: Recent Labs  Lab 08/18/23 0607 08/18/23 1249 08/18/23 1630 08/18/23 2108 08/19/23 0619  GLUCAP 140* 128* 123* 93 91   Lipid Profile: No results for input(s): "CHOL", "HDL", "LDLCALC", "TRIG", "CHOLHDL", "LDLDIRECT" in the last 72 hours. Thyroid Function Tests: No results for input(s): "TSH", "T4TOTAL", "FREET4", "T3FREE", "THYROIDAB" in the last 72 hours. Anemia Panel: No results for input(s): "VITAMINB12", "FOLATE", "FERRITIN", "TIBC", "IRON", "RETICCTPCT" in the last 72 hours. Sepsis Labs: Recent Labs  Lab 08/13/23 2017 08/13/23 2214  LATICACIDVEN 5.2* 2.1*    Recent Results (from the past 240 hours)  MRSA Next Gen by PCR, Nasal     Status: None   Collection Time: 08/13/23  7:24 PM   Specimen: Nasal Mucosa; Nasal Swab   Result Value Ref Range Status   MRSA by PCR Next Gen NOT DETECTED NOT DETECTED Final    Comment: (NOTE) The GeneXpert MRSA Assay (FDA approved for NASAL specimens only), is one component of a comprehensive MRSA colonization surveillance program. It is not intended to diagnose MRSA infection nor to guide or monitor treatment for MRSA infections. Test performance is not FDA approved in patients less than 9 years old. Performed at [facility], [address] [place], [place] [address]   Culture, Respiratory w Gram Stain     Status: None   Collection Time: 08/13/23  7:43 PM   Specimen: Tracheal Aspirate; Respiratory  Result Value Ref Range Status   Specimen Description TRACHEAL ASPIRATE  Final   Special Requests NONE  Final   Gram Stain   Final    ABUNDANT WBC PRESENT, PREDOMINANTLY PMN RARE GRAM POSITIVE COCCI IN PAIRS RARE GRAM NEGATIVE RODS Performed at [facility], [address], [place], [place] [address]    Culture FEW PSEUDOMONAS AERUGINOSA  Final   Report Status 08/16/2023 FINAL  Final   Organism ID, Bacteria PSEUDOMONAS AERUGINOSA  Final      Susceptibility   Pseudomonas aeruginosa - MIC*    CEFTAZIDIME >=64 RESISTANT Resistant     CIPROFLOXACIN <=0.25 SENSITIVE Sensitive     GENTAMICIN 2 SENSITIVE Sensitive     IMIPENEM 1 SENSITIVE Sensitive     CEFEPIME  RESISTANT Resistant     * FEW PSEUDOMONAS AERUGINOSA  Respiratory (~20 pathogens) panel by PCR     Status: None   Collection Time: 08/13/23  7:44 PM   Specimen: Nasopharyngeal Swab; Respiratory  Result Value Ref Range Status   Adenovirus NOT DETECTED NOT DETECTED Final   Coronavirus 229E NOT DETECTED NOT DETECTED Final    Comment: (NOTE) The Coronavirus on the Respiratory Panel, DOES NOT test for the novel  Coronavirus (2019 nCoV)    Coronavirus HKU1 NOT DETECTED NOT DETECTED Final   Coronavirus NL63 NOT DETECTED NOT DETECTED Final   Coronavirus OC43 NOT DETECTED NOT DETECTED Final    Metapneumovirus NOT DETECTED NOT DETECTED Final   Rhinovirus / Enterovirus NOT DETECTED NOT DETECTED Final   Influenza A NOT DETECTED  NOT DETECTED Final   Influenza B NOT DETECTED NOT DETECTED Final   Parainfluenza Virus 1 NOT DETECTED NOT DETECTED Final   Parainfluenza Virus 2 NOT DETECTED NOT DETECTED Final   Parainfluenza Virus 3 NOT DETECTED NOT DETECTED Final   Parainfluenza Virus 4 NOT DETECTED NOT DETECTED Final   Respiratory Syncytial Virus NOT DETECTED NOT DETECTED Final   Bordetella pertussis NOT DETECTED NOT DETECTED Final   Bordetella Parapertussis NOT DETECTED NOT DETECTED Final   Chlamydophila pneumoniae NOT DETECTED NOT DETECTED Final   Mycoplasma pneumoniae NOT DETECTED NOT DETECTED Final    Comment: Performed at [facility], [address], [place], [place] [address]  Culture, blood (Routine X 2) w Reflex to ID Panel     Status: None (Preliminary result)   Collection Time: 08/15/23 10:22 AM   Specimen: BLOOD LEFT ARM  Result Value Ref Range Status   Specimen Description BLOOD LEFT ARM  Final   Special Requests   Final    BOTTLES DRAWN AEROBIC AND ANAEROBIC Blood Culture adequate volume   Culture   Final    NO GROWTH 4 DAYS Performed at [facility], [address], [place], [place] [address]    Report Status PENDING  Incomplete  Culture, blood (Routine X 2) w Reflex to ID Panel     Status: None (Preliminary result)   Collection Time: 08/15/23 10:40 AM   Specimen: BLOOD LEFT ARM  Result Value Ref Range Status   Specimen Description BLOOD LEFT ARM  Final   Special Requests   Final    BOTTLES DRAWN AEROBIC AND ANAEROBIC Blood Culture adequate volume   Culture   Final    NO GROWTH 4 DAYS Performed at [facility], [address], [place], [place] [address]    Report Status PENDING  Incomplete         Radiology Studies: No results found.       Scheduled Meds:  apixaban   5 mg Oral BID   arformoterol   15 mcg Nebulization BID    atorvastatin   20 mg Oral q1800   bisoprolol   2.5 mg Oral Daily   budesonide  (PULMICORT ) nebulizer solution  0.5 mg Nebulization BID   busPIRone   7.5 mg Oral BID   Chlorhexidine  Gluconate Cloth  6 each Topical Daily   docusate sodium   100 mg Oral BID   furosemide   40 mg Oral BID   guaiFENesin   1,200 mg Oral BID   insulin  aspart  0-15 Units Subcutaneous TID WC   insulin  glargine-yfgn  6 Units Subcutaneous Daily   levofloxacin   750 mg Oral Q48H   methylPREDNISolone  (SOLU-MEDROL ) injection  40 mg Intravenous Q24H   multivitamin with minerals  1 tablet Oral Daily   nicotine   14 mg Transdermal Daily   mouth rinse  15 mL Mouth Rinse 4 times per day   pantoprazole   40 mg Oral QHS   polyethylene glycol  17 g Oral BID   revefenacin   175 mcg Nebulization Daily   senna  2 tablet Oral BID   Continuous Infusions:   LOS: 6 days    Time spent: 35 minutes    Kerianna Rawlinson A Emanuel Dowson, MD [facility]   If 7PM-7AM, please contact night-coverage [email or web address]  08/19/2023, 7:33 AM

## 2023-08-19 NOTE — Plan of Care (Signed)
   Problem: Education: Goal: Knowledge of General Education information will improve Description Including pain rating scale, medication(s)/side effects and non-pharmacologic comfort measures Outcome: Progressing

## 2023-08-19 NOTE — Plan of Care (Signed)
  Problem: Clinical Measurements: Goal: Will remain free from infection Outcome: Progressing Goal: Diagnostic test results will improve Outcome: Progressing Goal: Cardiovascular complication will be avoided Outcome: Progressing   Problem: Activity: Goal: Risk for activity intolerance will decrease Outcome: Progressing   Problem: Coping: Goal: Level of anxiety will decrease Outcome: Progressing   Problem: Elimination: Goal: Will not experience complications related to bowel motility Outcome: Progressing Goal: Will not experience complications related to urinary retention Outcome: Progressing   Problem: Pain Managment: Goal: General experience of comfort will improve and/or be controlled Outcome: Progressing   Problem: Safety: Goal: Ability to remain free from injury will improve Outcome: Progressing   Problem: Skin Integrity: Goal: Risk for impaired skin integrity will decrease Outcome: Progressing   Problem: Education: Goal: Knowledge of General Education information will improve Description: Including pain rating scale, medication(s)/side effects and non-pharmacologic comfort measures Outcome: Not Progressing   Problem: Health Behavior/Discharge Planning: Goal: Ability to manage health-related needs will improve Outcome: Not Progressing   Problem: Clinical Measurements: Goal: Ability to maintain clinical measurements within normal limits will improve Outcome: Not Progressing Goal: Respiratory complications will improve Outcome: Not Progressing   Problem: Nutrition: Goal: Adequate nutrition will be maintained Outcome: Not Progressing

## 2023-08-20 DIAGNOSIS — J9601 Acute respiratory failure with hypoxia: Secondary | ICD-10-CM | POA: Diagnosis not present

## 2023-08-20 LAB — CULTURE, BLOOD (ROUTINE X 2)
Culture: NO GROWTH
Culture: NO GROWTH
Special Requests: ADEQUATE
Special Requests: ADEQUATE

## 2023-08-20 LAB — GLUCOSE, CAPILLARY
Glucose-Capillary: 144 mg/dL — ABNORMAL HIGH (ref 70–99)
Glucose-Capillary: 152 mg/dL — ABNORMAL HIGH (ref 70–99)
Glucose-Capillary: 129 mg/dL — ABNORMAL HIGH (ref 70–99)
Glucose-Capillary: 220 mg/dL — ABNORMAL HIGH (ref 70–99)
Glucose-Capillary: 137 mg/dL — ABNORMAL HIGH (ref 70–99)

## 2023-08-20 MED ORDER — METOLAZONE 2.5 MG PO TABS
2.5000 mg | ORAL_TABLET | ORAL | Status: DC
  Administered 2023-08-21: 2.5 mg via ORAL
  Filled 2023-08-20: qty 1

## 2023-08-20 MED ORDER — TORSEMIDE 20 MG PO TABS
80.0000 mg | ORAL_TABLET | Freq: Two times a day (BID) | ORAL | Status: DC
  Administered 2023-08-20 – 2023-08-21 (×2): 80 mg via ORAL
  Filled 2023-08-20 (×2): qty 4

## 2023-08-20 MED FILL — Ipratropium Bromide Inhal Soln 0.02%: RESPIRATORY_TRACT | Qty: 2.5 | Status: AC

## 2023-08-20 MED FILL — Albuterol Sulfate Soln Nebu 0.083% (2.5 MG/3ML): RESPIRATORY_TRACT | Qty: 3 | Status: AC

## 2023-08-20 NOTE — Progress Notes (Signed)
 Physical Therapy Treatment Patient Details Name: Brian Parsons MRN: [id number] DOB: 04-19-1950 Today's Date: 08/20/2023   History of Present Illness 73 y.o. male admitted 08/13/23 with COPD exacerbation. VDRF 4/28-4/30. PMHx: Recent admission [facility] 4/23-4/26/25. DVT, tobacco use, severe COPD on 4L, secondary polycythemia, chronic HFpEF, HTN, T2DM, nonobstructive CAD    PT Comments  Pt admitted with above diagnosis. Pt was able to ambulate with RW with slight incr distance. Pt has incr fatigue with little activity. Poor endurance overall and needs 6L at rest and 8L with activity. Will continue to follow acutely.  Pt currently with functional limitations due to the deficits listed below (see PT Problem List). Pt will benefit from acute skilled PT to increase their independence and safety with mobility to allow discharge.       If plan is discharge home, recommend the following: Help with stairs or ramp for entrance;Assist for transportation;A lot of help with walking and/or transfers;A little help with bathing/dressing/bathroom;Assistance with cooking/housework   Can travel by private vehicle     Yes  Equipment Recommendations  None recommended by PT    Recommendations for Other Services OT consult     Precautions / Restrictions Precautions Precautions: Fall;Other (comment) Recall of Precautions/Restrictions: Impaired Precaution/Restrictions Comments: watch SpO2 Restrictions Weight Bearing Restrictions Per Provider Order: No     Mobility  Bed Mobility               General bed mobility comments: pt in chair    Transfers Overall transfer level: Needs assistance Equipment used: Rolling walker (2 wheels) Transfers: Sit to/from Stand, Bed to chair/wheelchair/BSC Sit to Stand: +2 physical assistance, Min assist           General transfer comment: assist to rise and steady, sits abruptly    Ambulation/Gait Ambulation/Gait assistance: Min assist, +2 safety/equipment Gait  Distance (Feet): 35 Feet Assistive device: Rolling walker (2 wheels) Gait Pattern/deviations: Step-through pattern, Decreased stride length, Trunk flexed, Drifts right/left Gait velocity: decreased Gait velocity interpretation: <1.8 ft/sec, indicate of risk for recurrent falls   General Gait Details: flexed trunk, reliance on RW, shuffling gait with pt able to walk to door , turn and walk back to chair. Pt 94% on6L pregait. Pt with desaturation to 84% on 8L with gait and tremors with activity.  Left on 6L as pt recovers with time to >90%.   Stairs             Wheelchair Mobility     Tilt Bed    Modified Rankin (Stroke Patients Only)       Balance           Standing balance support: Bilateral upper extremity supported, Reliant on assistive device for balance, During functional activity Standing balance-Leahy Scale: Poor Standing balance comment: Rw for gait with external support as well.                            Communication Communication Communication: Impaired Factors Affecting Communication: Hearing impaired  Cognition Arousal: Alert Behavior During Therapy: Flat affect   PT - Cognitive impairments: Orientation, Memory, Safety/Judgement, Problem solving   Orientation impairments: Time                     Following commands: Impaired Following commands impaired: Follows one step commands with increased time    Cueing Cueing Techniques: Verbal cues  Exercises General Exercises - Lower Extremity Ankle Circles/Pumps: AROM, Both, 5 reps, Supine Long Arc  Quad: AROM, Both, 5 reps, Seated Hip Flexion/Marching: AROM, Both, 5 reps, Seated Other Exercises Other Exercises: 5 sit to stands to practice technique min assist of 2    General Comments        Pertinent Vitals/Pain Pain Assessment Pain Assessment: No/denies pain    Home Living                          Prior Function            PT Goals (current goals can now  be found in the care plan section) Acute Rehab PT Goals Patient Stated Goal: return home Progress towards PT goals: Progressing toward goals    Frequency    Min 2X/week      PT Plan      Co-evaluation              AM-PAC PT "6 Clicks" Mobility   Outcome Measure  Help needed turning from your back to your side while in a flat bed without using bedrails?: A Little Help needed moving from lying on your back to sitting on the side of a flat bed without using bedrails?: A Little Help needed moving to and from a bed to a chair (including a wheelchair)?: A Little Help needed standing up from a chair using your arms (e.g., wheelchair or bedside chair)?: A Little Help needed to walk in hospital room?: A Lot Help needed climbing 3-5 steps with a railing? : Total 6 Click Score: 15    End of Session Equipment Utilized During Treatment: Gait belt;Oxygen  Activity Tolerance: Patient limited by fatigue Patient left: in chair;with call bell/phone within reach;with chair alarm set Nurse Communication: Mobility status PT Visit Diagnosis: Other abnormalities of gait and mobility (R26.89);History of falling (Z91.81);Difficulty in walking, not elsewhere classified (R26.2);Muscle weakness (generalized) (M62.81)     Time: 8295-6213 PT Time Calculation (min) (ACUTE ONLY): 16 min  Charges:    $Gait Training: 8-22 mins PT General Charges $$ ACUTE PT VISIT: 1 Visit                     Zilphia Kozinski M,PT Acute Rehab Services [phone]    Brian Parsons 08/20/2023, 3:49 PM

## 2023-08-20 NOTE — TOC Progression Note (Addendum)
 Transition of Care [facility]) - Progression Note    Patient Details  Name: Brian Parsons MRN: [id number] Date of Birth: 1950-05-12  Transition of Care [facility]) CM/SW Contact  Arron Big, [place] Phone Number: 08/20/2023, 12:33 PM  Clinical Narrative:   CSW spoke with patient at bedside about bed choice for SNF. Patient stated that it is okay for his son, Brian Parsons, to make the decision about where he should go for SNF. Per chart review, CSW spoke with Brian Parsons Friday 5/2 about bed choice and he chose [facility]. CSW left VM for Brian Parsons to confirm that [facility] is still the bed choice.   12:41 PM Brian Parsons called CSW back and chose [facility] at this time. CSW left VM for [facility] to discuss bed availability.   2:34 PM Facility inquired about Bipap needs. CSW spoke with bedside RN and reviewed chart. At this time, pt is noncompliant with bipap. Awaiting updated PT note to do insurance auth.   TOC will continue to follow.    Expected Discharge Plan: Skilled Nursing Facility Barriers to Discharge: Continued Medical Work up, English as a second language teacher  Expected Discharge Plan and Services       Living arrangements for the past 2 months: Single Family Home                                       Social Determinants of Health (SDOH) Interventions SDOH Screenings   Food Insecurity: No Food Insecurity (08/16/2023)  Housing: Low Risk  (08/16/2023)  Transportation Needs: No Transportation Needs (08/16/2023)  Utilities: Not At Risk (08/16/2023)  Social Connections: Patient Declined (08/16/2023)  Tobacco Use: High Risk (08/16/2023)    Readmission Risk Interventions    08/08/2023   10:24 PM  Readmission Risk Prevention Plan  Post Dischage Appt Complete  Medication Screening Complete  Transportation Screening Complete

## 2023-08-20 NOTE — Progress Notes (Addendum)
 PROGRESS NOTE    Brian Parsons  ZOX:[id number] DOB: 01-Jul-1950 DOA: 08/13/2023 PCP: Jonah Negus, NP   Brief Narrative: 73 year old with past medical history significant for COPD on 2 L nasal cannula, chronic heart failure preserved ejection fraction, hypertension, diabetes type 2, A-flutter on Eliquis , CAD presents to [facility] 4/28 with COPD exacerbation.  Patient was recently admitted to [facility] from 423 and discharged 426 on Doxy and prednisone .  He developed worsening shortness of breath and hypoxia and on 4/28 he was taken  to [facility] health.  On arrival patient was placed on BiPAP, no improvement of ABG on BiPAP and he required intubation.  He became hypotensive and required IV fluids and IV pressors.  He was admitted by CCM on 4/28 intubated.  Patient was extubated 4/30.  Care transferred to [facility] 5/2    Assessment & Plan:   Principal Problem:   Acute respiratory failure with hypoxia (HCC) Active Problems:   Malnutrition of moderate degree   Pressure injury of skin   1-Acute Hypoxic and Hypercapnia respiratory failure, on chronic 3 L at home COPD exacerbation versus CHF exacerbation - Patient recently  admitted  at [facility] treated for COPD exacerbation and CAP. Aaron AasBiPAP as needed .Continue Pulmicort , Brovana , Yupelri . Add albuterol / Guaifenesin  and flutter valve -I.V steroids----change to prednisone  yesterday, continue -On Levaquin  to cover for Pseudomonas. -Contiue BIPAP> agitated on 5/03.  blood gas was mixt venous, arterial. PCO 2 elevated but PH compensated, BIPAP to assist with tachypnea and hypercarbia Think he would benefit continuing bipap at bedtime at discharge  Sepsis, POA: in setting pseudomonas PNA Required IV pressors, leukocytosis, lactic acidosis at 5, sputum culture grew Pseudomonas. Chest x ray pulmonary atelectasis.  Continue with Levaquin .   Elevated troponin and abnormal BNP, likely demand ischemia Echo ejection fraction 40 to 45%,  mild pulmonary hypertension mild reduced RV systolic function Continue with diuresis, appears relatively euvolemic today, is net negative 9 liters this admission  AKI on CKD 3B:  -Presents with Cr 2.6--2.4 - Creatinine baseline 1.4 Suspect in the setting of hypotension, sepsis, could be ATN Renal function improving.   Monitor on Lasix   Diabetes type 2 relatively euglycemic today - Continue with Symlin 6 units daily, NovoLog  3 times a day with meals  Acute systolic heart failure, history of chronic diastolic heart failure Hypertension pHTN CAD - will transition from oral lasix  to home torsemide /metolazone  today - Ejection fraction down to 40 to 45% on 2D echo/25.  Cardiology was aware during last hospitalization. - Mild chronic hypertension with moderate resolution of RV systolic function -Follow-up with cardiology as an outpatient -Negative 9 L.   Recent atrial flutter: He was started on Eliquis  by cardiology, continue Eliquis .  Resumed bisoprolol .  Amiodarone  was discontinued in the ICU  Acute Metabolic Encephalopathy, suspect related to hypercapnia.  More calm today, no longer has a sitter  Tobacco abuse Smoking cessation  Moderate malnutrition On supplements  Pressure injury  See wound care documentation below  Pressure Injury 08/13/23 Coccyx Medial Stage 2 -  Partial thickness loss of dermis presenting as a shallow open injury with a red, pink wound bed without slough. (Active)  08/13/23 1920  Location: Coccyx  Location Orientation: Medial  Staging: Stage 2 -  Partial thickness loss of dermis presenting as a shallow open injury with a red, pink wound bed without slough.  Wound Description (Comments):   Present on Admission: Yes  Dressing Type Foam - Lift dressing to assess site every shift 08/19/23 1954  Nutrition Problem: Moderate Malnutrition Etiology: chronic illness (COPD)    Signs/Symptoms: mild fat depletion, mild muscle  depletion    Interventions: Prostat, Tube feeding, MVI  Estimated body mass index is 29.35 kg/m as calculated from the following:   Height as of this encounter: 5\' 7"  (1.702 m).   Weight as of this encounter: 85 kg.   DVT prophylaxis: Eliquis  Code Status: Full code Family Communication: none at bedside Disposition Plan: snf when bed available and insurance auth Status is: Inpatient Remains inpatient appropriate because: pending snf placement    Consultants:  CCM admitted patient  Procedures:  4/30 extubated   Antimicrobials:    Subjective: No complaints, says breathing fine. Just had BM.   Objective: Vitals:   08/20/23 0457 08/20/23 0804 08/20/23 1139 08/20/23 1547  BP: 122/87  118/87 112/80  Pulse: 86  87 91  Resp: 20  20 20   Temp: 98.1 F (36.7 C)  98.1 F (36.7 C) 98.1 F (36.7 C)  TempSrc: Oral  Oral Oral  SpO2: 100% 99% 98% 100%  Weight: 85 kg     Height:        Intake/Output Summary (Last 24 hours) at 08/20/2023 1704 Last data filed at 08/20/2023 0847 Gross per 24 hour  Intake 240 ml  Output 1400 ml  Net -1160 ml   Filed Weights   08/18/23 0500 08/19/23 0528 08/20/23 0457  Weight: 89.4 kg 85.3 kg 85 kg    Examination:  General exam: alert Respiratory system:  scattered rhonchi Cardiovascular system: S 1. S 2 RRR Gastrointestinal system: soft, obese Central nervous system: Alert. Follows command, moving all 4 Extremities: no LE edema    Data Reviewed: I have personally reviewed following labs and imaging studies  CBC: Recent Labs  Lab 08/13/23 2017 08/13/23 2024 08/15/23 0345 08/16/23 0347 08/17/23 0338 08/18/23 0334 08/19/23 0758  WBC 15.3*   < > 12.6* 18.6* 12.2* 13.9* 12.8*  NEUTROABS 13.1*  --   --   --   --   --   --   HGB 17.1*   < > 16.5 15.8 16.0 17.2* 17.0  HCT 55.8*   < > 51.7 51.2 51.5 54.9* 55.4*  MCV 98.4   < > 92.2 96.2 95.4 94.7 96.5  PLT 132*   < > 162 165 138* 165 139*   < > = values in this interval not  displayed.   Basic Metabolic Panel: Recent Labs  Lab 08/13/23 2017 08/13/23 2024 08/15/23 0345 08/16/23 0347 08/17/23 0338 08/18/23 0334 08/19/23 0758  NA 139   < > 139 137 137 140 140  K 4.7   < > 3.9 3.8 4.9 4.8 4.4  CL 79*   < > 85* 82* 80* 82* 80*  CO2 35*   < > 39* 40* 43* 44* 45*  GLUCOSE 221*   < > 214* 171* 103* 136* 109*  BUN 121*   < > 134* 120* 101* 83* 72*  CREATININE 2.63*   < > 2.20* 1.78* 1.63* 1.40* 1.51*  CALCIUM  9.0   < > 9.1 9.3 9.5 9.9 9.8  MG 2.2  --  2.3 2.2  --   --   --   PHOS  --   --  1.9* 4.0  --   --   --    < > = values in this interval not displayed.   GFR: Estimated Creatinine Clearance: 44.7 mL/min (A) (by C-G formula based on SCr of 1.51 mg/dL (H)). Liver Function Tests: Recent Labs  Lab 08/13/23 2017  AST 31  ALT 22  ALKPHOS 70  BILITOT 2.8*  PROT 5.8*  ALBUMIN 2.8*   No results for input(s): "LIPASE", "AMYLASE" in the last 168 hours. No results for input(s): "AMMONIA" in the last 168 hours. Coagulation Profile: Recent Labs  Lab 08/13/23 2017  INR 1.3*   Cardiac Enzymes: No results for input(s): "CKTOTAL", "CKMB", "CKMBINDEX", "TROPONINI" in the last 168 hours. BNP (last 3 results) No results for input(s): "PROBNP" in the last 8760 hours. HbA1C: No results for input(s): "HGBA1C" in the last 72 hours. CBG: Recent Labs  Lab 08/19/23 2103 08/20/23 0555 08/20/23 0843 08/20/23 1139 08/20/23 1547  GLUCAP 195* 144* 152* 129* 220*   Lipid Profile: No results for input(s): "CHOL", "HDL", "LDLCALC", "TRIG", "CHOLHDL", "LDLDIRECT" in the last 72 hours. Thyroid Function Tests: No results for input(s): "TSH", "T4TOTAL", "FREET4", "T3FREE", "THYROIDAB" in the last 72 hours. Anemia Panel: No results for input(s): "VITAMINB12", "FOLATE", "FERRITIN", "TIBC", "IRON", "RETICCTPCT" in the last 72 hours. Sepsis Labs: Recent Labs  Lab 08/13/23 2017 08/13/23 2214  LATICACIDVEN 5.2* 2.1*    Recent Results (from the past 240 hours)   MRSA Next Gen by PCR, Nasal     Status: None   Collection Time: 08/13/23  7:24 PM   Specimen: Nasal Mucosa; Nasal Swab  Result Value Ref Range Status   MRSA by PCR Next Gen NOT DETECTED NOT DETECTED Final    Comment: (NOTE) The GeneXpert MRSA Assay (FDA approved for NASAL specimens only), is one component of a comprehensive MRSA colonization surveillance program. It is not intended to diagnose MRSA infection nor to guide or monitor treatment for MRSA infections. Test performance is not FDA approved in patients less than 7 years old. Performed at [facility], [address] [place], [place] [address]   Culture, Respiratory w Gram Stain     Status: None   Collection Time: 08/13/23  7:43 PM   Specimen: Tracheal Aspirate; Respiratory  Result Value Ref Range Status   Specimen Description TRACHEAL ASPIRATE  Final   Special Requests NONE  Final   Gram Stain   Final    ABUNDANT WBC PRESENT, PREDOMINANTLY PMN RARE GRAM POSITIVE COCCI IN PAIRS RARE GRAM NEGATIVE RODS Performed at [facility], [address], [place], [place] [address]    Culture FEW PSEUDOMONAS AERUGINOSA  Final   Report Status 08/16/2023 FINAL  Final   Organism ID, Bacteria PSEUDOMONAS AERUGINOSA  Final      Susceptibility   Pseudomonas aeruginosa - MIC*    CEFTAZIDIME >=64 RESISTANT Resistant     CIPROFLOXACIN <=0.25 SENSITIVE Sensitive     GENTAMICIN 2 SENSITIVE Sensitive     IMIPENEM 1 SENSITIVE Sensitive     CEFEPIME  RESISTANT Resistant     * FEW PSEUDOMONAS AERUGINOSA  Respiratory (~20 pathogens) panel by PCR     Status: None   Collection Time: 08/13/23  7:44 PM   Specimen: Nasopharyngeal Swab; Respiratory  Result Value Ref Range Status   Adenovirus NOT DETECTED NOT DETECTED Final   Coronavirus 229E NOT DETECTED NOT DETECTED Final    Comment: (NOTE) The Coronavirus on the Respiratory Panel, DOES NOT test for the novel  Coronavirus (2019 nCoV)    Coronavirus HKU1 NOT DETECTED NOT DETECTED  Final   Coronavirus NL63 NOT DETECTED NOT DETECTED Final   Coronavirus OC43 NOT DETECTED NOT DETECTED Final   Metapneumovirus NOT DETECTED NOT DETECTED Final   Rhinovirus / Enterovirus NOT DETECTED NOT DETECTED Final  Influenza A NOT DETECTED NOT DETECTED Final   Influenza B NOT DETECTED NOT DETECTED Final   Parainfluenza Virus 1 NOT DETECTED NOT DETECTED Final   Parainfluenza Virus 2 NOT DETECTED NOT DETECTED Final   Parainfluenza Virus 3 NOT DETECTED NOT DETECTED Final   Parainfluenza Virus 4 NOT DETECTED NOT DETECTED Final   Respiratory Syncytial Virus NOT DETECTED NOT DETECTED Final   Bordetella pertussis NOT DETECTED NOT DETECTED Final   Bordetella Parapertussis NOT DETECTED NOT DETECTED Final   Chlamydophila pneumoniae NOT DETECTED NOT DETECTED Final   Mycoplasma pneumoniae NOT DETECTED NOT DETECTED Final    Comment: Performed at [facility], [address], [place], [place] [address]  Culture, blood (Routine X 2) w Reflex to ID Panel     Status: None   Collection Time: 08/15/23 10:22 AM   Specimen: BLOOD LEFT ARM  Result Value Ref Range Status   Specimen Description BLOOD LEFT ARM  Final   Special Requests   Final    BOTTLES DRAWN AEROBIC AND ANAEROBIC Blood Culture adequate volume   Culture   Final    NO GROWTH 5 DAYS Performed at [facility], [address], [place], [place] [address]    Report Status 08/20/2023 FINAL  Final  Culture, blood (Routine X 2) w Reflex to ID Panel     Status: None   Collection Time: 08/15/23 10:40 AM   Specimen: BLOOD LEFT ARM  Result Value Ref Range Status   Specimen Description BLOOD LEFT ARM  Final   Special Requests   Final    BOTTLES DRAWN AEROBIC AND ANAEROBIC Blood Culture adequate volume   Culture   Final    NO GROWTH 5 DAYS Performed at [facility], [address] [place], [place] [address]    Report Status 08/20/2023 FINAL  Final         Radiology Studies: No results found.       Scheduled  Meds:  apixaban   5 mg Oral BID   arformoterol   15 mcg Nebulization BID   atorvastatin   20 mg Oral q1800   bisoprolol   2.5 mg Oral Daily   budesonide  (PULMICORT ) nebulizer solution  0.5 mg Nebulization BID   busPIRone   7.5 mg Oral BID   Chlorhexidine  Gluconate Cloth  6 each Topical Daily   docusate sodium   100 mg Oral BID   furosemide   40 mg Oral BID   guaiFENesin   1,200 mg Oral BID   insulin  aspart  0-15 Units Subcutaneous TID WC   insulin  glargine-yfgn  6 Units Subcutaneous Daily   levofloxacin   750 mg Oral Q48H   multivitamin with minerals  1 tablet Oral Daily   nicotine   14 mg Transdermal Daily   mouth rinse  15 mL Mouth Rinse 4 times per day   pantoprazole   40 mg Oral QHS   polyethylene glycol  17 g Oral BID   predniSONE   40 mg Oral Q breakfast   revefenacin   175 mcg Nebulization Daily   senna  2 tablet Oral BID   Continuous Infusions:   LOS: 7 days    Time spent: 35 minutes    Raymonde Calico, MD [facility]   If 7PM-7AM, please contact night-coverage [email or web address]  08/20/2023, 5:04 PM

## 2023-08-20 NOTE — Plan of Care (Signed)

## 2023-08-21 DIAGNOSIS — J9601 Acute respiratory failure with hypoxia: Secondary | ICD-10-CM | POA: Diagnosis not present

## 2023-08-21 LAB — BASIC METABOLIC PANEL WITH GFR
BUN: 62 mg/dL — ABNORMAL HIGH (ref 8–23)
CO2: >45 mmol/L — ABNORMAL HIGH (ref 22–32)
Calcium: 9.1 mg/dL (ref 8.9–10.3)
Chloride: 80 mmol/L — ABNORMAL LOW (ref 98–111)
Creatinine, Ser: 1.43 mg/dL — ABNORMAL HIGH (ref 0.61–1.24)
GFR, Estimated: 51 mL/min — ABNORMAL LOW (ref >60)
Glucose, Bld: 91 mg/dL (ref 70–99)
Potassium: 3.9 mmol/L (ref 3.5–5.1)
Sodium: 141 mmol/L (ref 135–145)

## 2023-08-21 LAB — GLUCOSE, CAPILLARY
Glucose-Capillary: 77 mg/dL (ref 70–99)
Glucose-Capillary: 258 mg/dL — ABNORMAL HIGH (ref 70–99)

## 2023-08-21 MED ORDER — LEVOFLOXACIN 750 MG PO TABS: 750.0000 mg | ORAL_TABLET | ORAL | 0 refills | Status: AC

## 2023-08-21 MED ORDER — POLYETHYLENE GLYCOL 3350 17 G PO PACK: 17.0000 g | PACK | Freq: Two times a day (BID) | ORAL | 0 refills | Status: DC

## 2023-08-21 MED ORDER — DOCUSATE SODIUM 100 MG PO CAPS
100.0000 mg | ORAL_CAPSULE | Freq: Two times a day (BID) | ORAL | 0 refills | Status: DC
Start: 2023-08-21 — End: 2023-10-26

## 2023-08-21 MED ORDER — LORAZEPAM 0.5 MG PO TABS: 0.5000 mg | ORAL_TABLET | Freq: Every day | ORAL | 0 refills | Status: AC | PRN

## 2023-08-21 MED ORDER — GUAIFENESIN ER 600 MG PO TB12: 1200.0000 mg | ORAL_TABLET | Freq: Two times a day (BID) | ORAL | 0 refills | Status: DC

## 2023-08-21 MED ORDER — NICOTINE 14 MG/24HR TD PT24: 14.0000 mg | MEDICATED_PATCH | Freq: Every day | TRANSDERMAL | 0 refills | Status: DC

## 2023-08-21 MED ORDER — ADULT MULTIVITAMIN W/MINERALS CH: 1.0000 | ORAL_TABLET | Freq: Every day | ORAL | 0 refills | Status: DC

## 2023-08-21 MED ORDER — SENNA 8.6 MG PO TABS: 2.0000 | ORAL_TABLET | Freq: Two times a day (BID) | ORAL | 0 refills | Status: DC

## 2023-08-21 NOTE — TOC Transition Note (Addendum)
 Transition of Care [facility]) - Discharge Note   Patient Details  Name: Brian Parsons MRN: [id number] Date of Birth: 11-05-1950  Transition of Care [facility]) CM/SW Contact:  Jennett Model, RN Phone Number: 08/21/2023, 12:01 PM   Clinical Narrative:    For dc today, patient is set up with [facility].  He has transportation home.   NCM spoke with fiance , Moira Andrews on the phone, and she states she wants patient to go to rehab before he comes home.  She spoke with patient on the phone and convinced him to go to rehab.  He said he will go for a week.  NCM notified CSW.      Barriers to Discharge: Continued Medical Work up   Patient Goals and CMS Choice Patient states their goals for this hospitalization and ongoing recovery are:: To get better          Discharge Placement                       Discharge Plan and Services Additional resources added to the After Visit Summary for                                       Social Drivers of Health (SDOH) Interventions SDOH Screenings   Food Insecurity: No Food Insecurity (08/16/2023)  Housing: Low Risk  (08/16/2023)  Transportation Needs: No Transportation Needs (08/16/2023)  Utilities: Not At Risk (08/16/2023)  Social Connections: Patient Declined (08/16/2023)  Tobacco Use: High Risk (08/16/2023)     Readmission Risk Interventions    08/08/2023   10:24 PM  Readmission Risk Prevention Plan  Post Dischage Appt Complete  Medication Screening Complete  Transportation Screening Complete

## 2023-08-21 NOTE — TOC Progression Note (Addendum)
 Transition of Care [facility]) - Progression Note    Patient Details  Name: Brian Parsons MRN: [id number] Date of Birth: 1951/01/29  Transition of Care [facility]) CM/SW Contact  Arron Big, [place] Phone Number: 08/21/2023, 9:55 AM  Clinical Narrative:   Insurance auth for [employer] approved for 08/22/2023-08/24/2023. Auth id [id number]. CSW notified MD and facility.   Facility to check when bipap will arrive.   11:02 AM Facility notified CSW that they have patients bipap. CSW called insurance to update authorization dates. New start of care dates are 5/6-5/8 and auth id is the same. CSW notified MD.   11:51 AM Per bedside RN, patient is adamant about going home at this time. CSW called pts fiance, Moira Andrews, to inquire about patient going home. Moira Andrews stated she would like to speak with MD but is okay with patient dc home. She said he has a big concentrator in the living room and a portable one in the bedroom. No prior hx of HH and does not have a preference of an agency.   CSW called Merlyn Starring and left a VM. CSW notified facility of change in disposition.    TOC will continue to follow.    Expected Discharge Plan: Skilled Nursing Facility Barriers to Discharge: Continued Medical Work up  Expected Discharge Plan and Services       Living arrangements for the past 2 months: Single Family Home                                       Social Determinants of Health (SDOH) Interventions SDOH Screenings   Food Insecurity: No Food Insecurity (08/16/2023)  Housing: Low Risk  (08/16/2023)  Transportation Needs: No Transportation Needs (08/16/2023)  Utilities: Not At Risk (08/16/2023)  Social Connections: Patient Declined (08/16/2023)  Tobacco Use: High Risk (08/16/2023)    Readmission Risk Interventions    08/08/2023   10:24 PM  Readmission Risk Prevention Plan  Post Dischage Appt Complete  Medication Screening Complete  Transportation Screening Complete

## 2023-08-21 NOTE — TOC Transition Note (Signed)
 Transition of Care [facility]) - Discharge Note   Patient Details  Name: Brian Parsons MRN: [id number] Date of Birth: Jan 09, 1951  Transition of Care [facility]) CM/SW Contact:  Arron Big, LCSWA Phone Number: 08/21/2023, 1:57 PM   Clinical Narrative:   Patient will DC to: [facility] Anticipated DC date: 08/21/23 Family notified: Moira Andrews (fiance) Transport by: Lyna Sandhoff   Per MD patient ready for DC to [facility]. RN to call report prior to discharge [phone]). RN, patient, patient's family, and facility notified of DC. Discharge Summary and FL2 sent to facility. DC packet on chart. Ambulance transport requested for patient at 1:59 PM.    CSW will sign off for now as social work intervention is no longer needed. Please consult us  again if new needs arise.       Final next level of care: Skilled Nursing Facility Barriers to Discharge: Barriers Resolved   Patient Goals and CMS Choice Patient states their goals for this hospitalization and ongoing recovery are:: To get better          Discharge Placement              Patient [facility] bed at: [facility] Patient to be transferred to facility by: [facility] Name of family member notified: Moira Andrews (fiance) Patient and family notified of of transfer: 08/21/23  Discharge Plan and Services Additional resources added to the After Visit Summary for                                       Social Drivers of Health (SDOH) Interventions SDOH Screenings   Food Insecurity: No Food Insecurity (08/16/2023)  Housing: Low Risk  (08/16/2023)  Transportation Needs: No Transportation Needs (08/16/2023)  Utilities: Not At Risk (08/16/2023)  Social Connections: Patient Declined (08/16/2023)  Tobacco Use: High Risk (08/16/2023)     Readmission Risk Interventions    08/08/2023   10:24 PM  Readmission Risk Prevention Plan  Post Dischage Appt Complete  Medication Screening Complete   Transportation Screening Complete

## 2023-08-21 NOTE — Care Management Important Message (Signed)
 Important Message  Patient Details  Name: Brian Parsons MRN: [id number] Date of Birth: Dec 06, 1950   Important Message Given:  Yes - Medicare IM     Janith Melnick 08/21/2023, 3:18 PM

## 2023-08-21 NOTE — Plan of Care (Signed)
   Problem: Education: Goal: Knowledge of General Education information will improve Description: Including pain rating scale, medication(s)/side effects and non-pharmacologic comfort measures Outcome: Progressing   Problem: Pain Managment: Goal: General experience of comfort will improve and/or be controlled Outcome: Progressing   Problem: Safety: Goal: Ability to remain free from injury will improve Outcome: Progressing

## 2023-08-21 NOTE — TOC Progression Note (Addendum)
 Transition of Care [facility]) - Progression Note    Patient Details  Name: Brian Parsons MRN: [id number] Date of Birth: Jul 21, 1950  Transition of Care [facility]) CM/SW Contact  Jennett Model, RN Phone Number: 08/21/2023, 11:54 AM  Clinical Narrative:    Per CSW they do not have a preference for the [facility] agency,  NCM made referral to [facility] with Gasper Karst for [facility],  HHPT,.   He is able to take referral.  Soc will begin 24 to 48 hrs post dc.    Expected Discharge Plan: Skilled Nursing Facility Barriers to Discharge: Continued Medical Work up  Expected Discharge Plan and Services       Living arrangements for the past 2 months: Single Family Home Expected Discharge Date: 08/21/23                                     Social Determinants of Health (SDOH) Interventions SDOH Screenings   Food Insecurity: No Food Insecurity (08/16/2023)  Housing: Low Risk  (08/16/2023)  Transportation Needs: No Transportation Needs (08/16/2023)  Utilities: Not At Risk (08/16/2023)  Social Connections: Patient Declined (08/16/2023)  Tobacco Use: High Risk (08/16/2023)    Readmission Risk Interventions    08/08/2023   10:24 PM  Readmission Risk Prevention Plan  Post Dischage Appt Complete  Medication Screening Complete  Transportation Screening Complete

## 2023-08-21 NOTE — Plan of Care (Signed)
  Problem: Education: Goal: Knowledge of General Education information will improve Description: Including pain rating scale, medication(s)/side effects and non-pharmacologic comfort measures Outcome: Adequate for Discharge   Pt to be discharged to [facility] after much discussion with pt and family

## 2023-08-21 NOTE — Discharge Summary (Signed)
 Physician Discharge Summary   Patient: Brian Parsons MRN: [id number] DOB: 04-15-1951  Admit date:     08/13/2023  Discharge date: 08/21/23  Discharge Physician: Danette Duos   PCP: Jonah Negus, NP   Recommendations at discharge:   Needs Bmet to follow renal function Needs to follow up with Pulmonologist and cardiology  BIPAP at [facility]  Discharge Diagnoses: Principal Problem:   Acute respiratory failure with hypoxia (HCC) Active Problems:   Malnutrition of moderate degree   Pressure injury of skin  Resolved Problems:   * No resolved hospital problems. [facility] Course: 73 year old with past medical history significant for COPD on 2 L nasal cannula, chronic heart failure preserved ejection fraction, hypertension, diabetes type 2, A-flutter on Eliquis , CAD presents to [facility] 4/28 with COPD exacerbation.  Patient was recently admitted to [facility] from 423 and discharged 426 on Doxy and prednisone .  He developed worsening shortness of breath and hypoxia and on 4/28 he was taken  to [facility] health.  On arrival patient was placed on BiPAP, no improvement of ABG on BiPAP and he required intubation.  He became hypotensive and required IV fluids and IV pressors.  He was admitted by CCM on 4/28 intubated.   Patient was extubated 4/30.  Care transferred to [facility] 5/2    Assessment and Plan: 1-Acute Hypoxic and Hypercapnia respiratory failure, on chronic 3 L at home COPD exacerbation versus CHF exacerbation - Patient recently  admitted  at [facility] treated for COPD exacerbation and CAP. Aaron AasBiPAP as needed .Continue Pulmicort , Brovana , Yupelri . Add albuterol / Guaifenesin  and flutter valve -I.V steroids----change to prednisone  yesterday, continue -On Levaquin  to cover for Pseudomonas. One more dose at discharge.  -Contiue BIPAP> agitated on 5/03.  blood gas was mixt venous, arterial. PCO 2 elevated but PH compensated, BIPAP to assist with tachypnea and hypercarbia he  would benefit continuing bipap at bedtime at discharge   Sepsis, POA: in setting pseudomonas PNA Required IV pressors, leukocytosis, lactic acidosis at 5, sputum culture grew Pseudomonas. Chest x ray pulmonary atelectasis.  Continue with Levaquin .    Elevated troponin and abnormal BNP, likely demand ischemia Echo ejection fraction 40 to 45%, mild pulmonary hypertension mild reduced RV systolic function Continue with diuresis, appears relatively euvolemic, is net negative 9 liters this admission   AKI on CKD 3B:  -Presents with Cr 2.6--2.4 - Creatinine baseline 1.4 Suspect in the setting of hypotension, sepsis, could be ATN Renal function improving.   Monitor on Diuretics   Diabetes type 2 relatively euglycemic today - Continue with Symlin 6 units daily, NovoLog  3 times a day with meals   Acute systolic heart failure, history of chronic diastolic heart failure Hypertension pHTN CAD - Back on torsemide /metolazone   - Ejection fraction down to 40 to 45% on 2D echo/25.  Cardiology was aware during last hospitalization. - Mild chronic hypertension with moderate resolution of RV systolic function -Follow-up with cardiology as an outpatient -Negative 10 L.    Recent atrial flutter: He was started on Eliquis  by cardiology, continue Eliquis .  Resumed bisoprolol .  Amiodarone  was discontinued in the ICU   Acute Metabolic Encephalopathy, suspect related to hypercapnia.  More calm today, no longer has a sitter   Tobacco abuse Smoking cessation   Moderate malnutrition On supplements   Pressure injury   See wound care documentation below       Pressure Injury 08/13/23 Coccyx Medial Stage 2 -  Partial thickness loss of dermis presenting as a shallow open  injury with a red, pink wound bed without slough. (Active)  08/13/23 1920  Location: Coccyx  Location Orientation: Medial  Staging: Stage 2 -  Partial thickness loss of dermis presenting as a shallow open injury with a red, pink  wound bed without slough.  Wound Description (Comments):   Present on Admission: Yes  Dressing Type Foam - Lift dressing to assess site every shift 08/19/23 1954        Nutrition Problem: Moderate Malnutrition Etiology: chronic illness (COPD)       Signs/Symptoms: mild fat depletion, mild muscle depletion       Interventions: Prostat, Tube feeding, MVI   Estimated body mass index is 29.35 kg/m as calculated from the following:   Height as of this encounter: 5\' 7"  (1.702 m).   Weight as of this encounter: 85 kg.            Consultants: CCM  Procedures performed: Intubated Disposition: Skilled nursing facility Diet recommendation:  Discharge Diet Orders (From admission, onward)     Start     Ordered   08/21/23 0000  Diet - low sodium heart healthy        08/21/23 1120           Cardiac diet DISCHARGE MEDICATION: Allergies as of 08/21/2023       Reactions   Citalopram Other (See Comments)   GI bleed   Bupropion Other (See Comments)   Unknown   Escitalopram  Other (See Comments)   Unknown   Penicillins Hives   Trazodone Hcl Other (See Comments)   Unknown   Varenicline Tartrate Other (See Comments)   Bad dreams   Amoxicillin Hives, Rash   Penicillin G Rash        Medication List     STOP taking these medications    doxepin  10 MG capsule Commonly known as: SINEQUAN    doxycycline  100 MG tablet Commonly known as: VIBRA -TABS   predniSONE  20 MG tablet Commonly known as: DELTASONE    ramelteon  8 MG tablet Commonly known as: ROZEREM        TAKE these medications    albuterol  0.63 MG/3ML nebulizer solution Commonly known as: ACCUNEB  Take 3 mLs (0.63 mg total) by nebulization every 4 (four) hours as needed.   albuterol  108 (90 Base) MCG/ACT inhaler Commonly known as: VENTOLIN  HFA Inhale 2 puffs into the lungs every 6 (six) hours as needed for wheezing or shortness of breath.   allopurinol  100 MG tablet Commonly known as: ZYLOPRIM  Take  200 mg by mouth 2 (two) times daily.   apixaban  5 MG Tabs tablet Commonly known as: ELIQUIS  Take 1 tablet (5 mg total) by mouth 2 (two) times daily.   atorvastatin  20 MG tablet Commonly known as: LIPITOR Take 20 mg by mouth daily at 6 PM.   azelastine  0.1 % nasal spray Commonly known as: ASTELIN  Place 1 spray into both nostrils 2 (two) times daily.   bisoprolol  5 MG tablet Commonly known as: ZEBETA  Take 0.5 tablets (2.5 mg total) by mouth daily.   busPIRone  7.5 MG tablet Commonly known as: BUSPAR  Take 7.5 mg by mouth 2 (two) times daily.   colchicine  0.6 MG tablet Take 0.6 mg by mouth as needed (gout).   cyanocobalamin  1000 MCG tablet Commonly known as: VITAMIN B12 Take 1,000 mcg by mouth daily.   docusate sodium  100 MG capsule Commonly known as: COLACE Take 1 capsule (100 mg total) by mouth 2 (two) times daily.   fluticasone  50 MCG/ACT nasal spray Commonly known as:  FLONASE  Place 1 spray into both nostrils daily.   guaiFENesin  600 MG 12 hr tablet Commonly known as: MUCINEX  Take 2 tablets (1,200 mg total) by mouth 2 (two) times daily.   ipratropium-albuterol  0.5-2.5 (3) MG/3ML Soln Commonly known as: DUONEB Take 3 mLs by nebulization every 4 (four) hours as needed.   levofloxacin  750 MG tablet Commonly known as: LEVAQUIN  Take 1 tablet (750 mg total) by mouth every other day for 1 day. Start taking on: Aug 22, 2023   LORazepam  0.5 MG tablet Commonly known as: ATIVAN  Take 0.5 mg by mouth daily as needed for anxiety.   magnesium  gluconate 500 (27 Mg) MG Tabs tablet Commonly known as: MAGONATE Take 500 mg by mouth in the morning and at bedtime.   metolazone  2.5 MG tablet Commonly known as: ZAROXOLYN  Take 2.5 mg by mouth every other day.   multivitamin with minerals Tabs tablet Take 1 tablet by mouth daily. Start taking on: Aug 22, 2023   nicotine  14 mg/24hr patch Commonly known as: NICODERM CQ  - dosed in mg/24 hours Place 1 patch (14 mg total) onto the  skin daily. Start taking on: Aug 22, 2023   pantoprazole  40 MG tablet Commonly known as: PROTONIX  Take 1 tablet (40 mg total) by mouth daily for 14 days.   polyethylene glycol 17 g packet Commonly known as: MIRALAX  / GLYCOLAX  Take 17 g by mouth 2 (two) times daily.   potassium chloride  SA 20 MEQ tablet Commonly known as: KLOR-CON  M Take 20 mEq by mouth daily.   senna 8.6 MG Tabs tablet Commonly known as: SENOKOT Take 2 tablets (17.2 mg total) by mouth 2 (two) times daily.   sildenafil 50 MG tablet Commonly known as: VIAGRA Take 50 mg by mouth as needed for erectile dysfunction.   torsemide  20 MG tablet Commonly known as: DEMADEX  Take 80 mg by mouth 2 (two) times daily.   Trelegy Ellipta  100-62.5-25 MCG/ACT Aepb Generic drug: Fluticasone -Umeclidin-Vilant One click each am   True Metrix Meter Devi See admin instructions.   ZyrTEC Allergy 10 MG tablet Generic drug: cetirizine Take 10 mg by mouth daily.               Discharge Care Instructions  (From admission, onward)           Start     Ordered   08/21/23 0000  Discharge wound care:       Comments: See above   08/21/23 1120            Contact information for after-discharge care     Destination     [facility] .   Service: Skilled Nursing Contact information: [address] [place]  [phone]                    Discharge Exam: Brian Parsons Weights   08/18/23 0500 08/19/23 0528 08/20/23 0457  Weight: 89.4 kg 85.3 kg 85 kg   General; NAD  Condition at discharge: stable  The results of significant diagnostics from this hospitalization (including imaging, microbiology, ancillary and laboratory) are listed below for reference.   Imaging Studies: DG CHEST PORT 1 VIEW Result Date: 08/16/2023 CLINICAL DATA:  Acute respiratory failure EXAM: PORTABLE CHEST 1 VIEW COMPARISON:  08/13/2023 FINDINGS: Cardiac shadow is enlarged. Vascular congestion  is noted without significant interstitial edema. Right basilar atelectasis is noted new from the prior exam. Endotracheal tube and gastric catheter have been removed in the interval. IMPRESSION: Persistent vascular congestion with  new right basilar atelectasis. Electronically Signed   By: Violeta Grey M.D.   On: 08/16/2023 09:26   DG Abd 1 View Result Date: 08/13/2023 CLINICAL DATA:  Enteric catheter placement EXAM: ABDOMEN - 1 VIEW COMPARISON:  03/19/2022 FINDINGS: Frontal view of the lower chest and upper abdomen demonstrates enteric catheter passing below diaphragm, tip and side port project over the gastric fundus. Bowel gas pattern is unremarkable. Enlarged cardiac silhouette. IMPRESSION: 1. Enteric catheter tip projecting over gastric fundus. Electronically Signed   By: Bobbye Burrow M.D.   On: 08/13/2023 21:08   Portable Chest x-ray Result Date: 08/13/2023 CLINICAL DATA:  Intubated, COPD EXAM: PORTABLE CHEST 1 VIEW COMPARISON:  08/08/2023 FINDINGS: Single frontal view of the chest demonstrates endotracheal tube overlying tracheal air column, tip 4.5 cm above carina. Enteric catheter passes below diaphragm, tip excluded by collimation. Side port projects approximately 3 cm above the gastroesophageal junction. Cardiac silhouette is enlarged. There is increased pulmonary vascular congestion, with developing bibasilar consolidation suspicious for edema. Small right effusion. No pneumothorax. No acute bony abnormalities. IMPRESSION: 1. Constellation of findings consistent with congestive heart failure and developing pulmonary edema. Electronically Signed   By: Bobbye Burrow M.D.   On: 08/13/2023 19:55   ECHOCARDIOGRAM COMPLETE Result Date: 08/11/2023    ECHOCARDIOGRAM REPORT   Patient Name:   KIPTON COTTEN Date of Exam: 08/11/2023 Medical Rec #:  [id number]     Height:       67.0 in Accession #:    [id number]    Weight:       190.0 lb Date of Birth:  28-Feb-1951     BSA:          1.979 m Patient Age:    72  years      BP:           104/72 mmHg Patient Gender: M             HR:           89 bpm. Exam Location:  [facility] Procedure: 2D Echo, Cardiac Doppler, Color Doppler and Intracardiac            Opacification Agent (Both Spectral and Color Flow Doppler were            utilized during procedure). Indications:    Dyspnea R06.00  History:        Patient has prior history of Echocardiogram examinations, most                 recent 03/20/2022. CHF, COPD; Risk Factors:Diabetes and                 Hypertension.  Sonographer:    Astrid Blamer Referring Phys: [id number] Joyceann No BRANCH IMPRESSIONS  1. Left ventricular ejection fraction, by estimation, is 40 to 45%. The left ventricle has mild to moderately decreased function. The left ventricle demonstrates global hypokinesis. Left ventricular diastolic function could not be evaluated. There is the interventricular septum is flattened in systole and diastole, consistent with right ventricular pressure and volume overload.  2. Right ventricular systolic function is moderately reduced. The right ventricular size is moderately enlarged. Mildly increased right ventricular wall thickness. There is mildly elevated pulmonary artery systolic pressure. The estimated right ventricular systolic pressure is 41.4 mmHg.  3. Left atrial size was mildly dilated.  4. Right atrial size was mildly dilated.  5. Moderate pericardial effusion. The pericardial effusion is circumferential. There is no evidence of cardiac tamponade.  6. The mitral  valve is normal in structure. Trivial mitral valve regurgitation.  7. The aortic valve was not well visualized. There is mild calcification of the aortic valve. There is mild thickening of the aortic valve. Aortic valve regurgitation is not visualized. Aortic valve sclerosis is present, with no evidence of aortic valve  stenosis.  8. The inferior vena cava is dilated in size with <50% respiratory variability, suggesting right atrial pressure of 15 mmHg.  Comparison(s): Prior images reviewed side by side. The left ventricular function has improved. The right ventricular systolic function has improved. Although improved, both chambers remain moderately depressed. FINDINGS  Left Ventricle: Left ventricular ejection fraction, by estimation, is 40 to 45%. The left ventricle has mild to moderately decreased function. The left ventricle demonstrates global hypokinesis. The left ventricular internal cavity size was normal in size. There is no left ventricular hypertrophy. The interventricular septum is flattened in systole and diastole, consistent with right ventricular pressure and volume overload. Left ventricular diastolic function could not be evaluated due to atrial fibrillation. Left ventricular diastolic function could not be evaluated. Right Ventricle: The right ventricular size is moderately enlarged. Mildly increased right ventricular wall thickness. Right ventricular systolic function is moderately reduced. There is mildly elevated pulmonary artery systolic pressure. The tricuspid regurgitant velocity is 2.57 m/s, and with an assumed right atrial pressure of 15 mmHg, the estimated right ventricular systolic pressure is 41.4 mmHg. Left Atrium: Left atrial size was mildly dilated. Right Atrium: Right atrial size was mildly dilated. Pericardium: A moderately sized pericardial effusion is present. The pericardial effusion is circumferential. There is no evidence of cardiac tamponade. Mitral Valve: The mitral valve is normal in structure. Trivial mitral valve regurgitation. Tricuspid Valve: The tricuspid valve is normal in structure. Tricuspid valve regurgitation is trivial. Aortic Valve: The aortic valve was not well visualized. There is mild calcification of the aortic valve. There is mild thickening of the aortic valve. Aortic valve regurgitation is not visualized. Aortic valve sclerosis is present, with no evidence of aortic valve stenosis. Aortic valve mean  gradient measures 5.0 mmHg. Aortic valve peak gradient measures 7.4 mmHg. Aortic valve area, by VTI measures 2.78 cm. Pulmonic Valve: The pulmonic valve was grossly normal. Pulmonic valve regurgitation is not visualized. No evidence of pulmonic stenosis. Aorta: The aortic root and ascending aorta are structurally normal, with no evidence of dilitation. Venous: The inferior vena cava is dilated in size with less than 50% respiratory variability, suggesting right atrial pressure of 15 mmHg. IAS/Shunts: The interatrial septum was not well visualized.  LEFT VENTRICLE PLAX 2D LVIDd:         5.30 cm LVIDs:         4.20 cm LV PW:         0.90 cm LV IVS:        0.90 cm LVOT diam:     2.00 cm LV SV:         58 LV SV Index:   30 LVOT Area:     3.14 cm  RIGHT VENTRICLE            IVC RV S prime:     8.70 cm/s  IVC diam: 3.20 cm TAPSE (M-mode): 1.3 cm LEFT ATRIUM             Index        RIGHT ATRIUM           Index LA Vol (A2C):   43.5 ml 21.98 ml/m  RA Area:  18.80 cm LA Vol (A4C):   49.1 ml 24.81 ml/m  RA Volume:   46.30 ml  23.40 ml/m LA Biplane Vol: 47.0 ml 23.75 ml/m  AORTIC VALVE AV Area (Vmax):    2.63 cm AV Area (Vmean):   2.37 cm AV Area (VTI):     2.78 cm AV Vmax:           136.00 cm/s AV Vmean:          104.000 cm/s AV VTI:            0.210 m AV Peak Grad:      7.4 mmHg AV Mean Grad:      5.0 mmHg LVOT Vmax:         114.00 cm/s LVOT Vmean:        78.300 cm/s LVOT VTI:          0.186 m LVOT/AV VTI ratio: 0.89  AORTA Ao Root diam: 3.30 cm MITRAL VALVE               TRICUSPID VALVE MV Area (PHT): 5.58 cm    TR Peak grad:   26.4 mmHg MV Decel Time: 136 msec    TR Vmax:        257.00 cm/s MV E velocity: 87.00 cm/s                            SHUNTS                            Systemic VTI:  0.19 m                            Systemic Diam: 2.00 cm Karyl Paget Croitoru MD Electronically signed by Luana Rumple MD Signature Date/Time: 08/11/2023/4:06:28 PM    Final    DG Chest Port 1 View Result Date:  08/08/2023 CLINICAL DATA:  Dyspnea EXAM: PORTABLE CHEST 1 VIEW COMPARISON:  March 19, 2022 FINDINGS: Streaky bibasilar atelectasis. No focal airspace consolidation, pleural effusion, or pneumothorax. Redemonstrated cardiomegaly. Aortic atherosclerosis. No acute fracture or destructive lesions. Multilevel thoracic osteophytosis. IMPRESSION: Unchanged cardiomegaly with streaky bibasilar atelectasis. Electronically Signed   By: Rance Burrows M.D.   On: 08/08/2023 12:19    Microbiology: Results for orders placed or performed during the hospital encounter of 08/13/23  MRSA Next Gen by PCR, Nasal     Status: None   Collection Time: 08/13/23  7:24 PM   Specimen: Nasal Mucosa; Nasal Swab  Result Value Ref Range Status   MRSA by PCR Next Gen NOT DETECTED NOT DETECTED Final    Comment: (NOTE) The GeneXpert MRSA Assay (FDA approved for NASAL specimens only), is one component of a comprehensive MRSA colonization surveillance program. It is not intended to diagnose MRSA infection nor to guide or monitor treatment for MRSA infections. Test performance is not FDA approved in patients less than 52 years old. Performed at [facility], [address], [place], [place] [address]   Culture, Respiratory w Gram Stain     Status: None   Collection Time: 08/13/23  7:43 PM   Specimen: Tracheal Aspirate; Respiratory  Result Value Ref Range Status   Specimen Description TRACHEAL ASPIRATE  Final   Special Requests NONE  Final   Gram Stain   Final    ABUNDANT WBC PRESENT, PREDOMINANTLY PMN RARE GRAM POSITIVE COCCI IN PAIRS  RARE GRAM NEGATIVE RODS Performed at [facility], [address], [place], [place] [address]    Culture FEW PSEUDOMONAS AERUGINOSA  Final   Report Status 08/16/2023 FINAL  Final   Organism ID, Bacteria PSEUDOMONAS AERUGINOSA  Final      Susceptibility   Pseudomonas aeruginosa - MIC*    CEFTAZIDIME >=64 RESISTANT Resistant     CIPROFLOXACIN <=0.25 SENSITIVE Sensitive      GENTAMICIN 2 SENSITIVE Sensitive     IMIPENEM 1 SENSITIVE Sensitive     CEFEPIME  RESISTANT Resistant     * FEW PSEUDOMONAS AERUGINOSA  Respiratory (~20 pathogens) panel by PCR     Status: None   Collection Time: 08/13/23  7:44 PM   Specimen: Nasopharyngeal Swab; Respiratory  Result Value Ref Range Status   Adenovirus NOT DETECTED NOT DETECTED Final   Coronavirus 229E NOT DETECTED NOT DETECTED Final    Comment: (NOTE) The Coronavirus on the Respiratory Panel, DOES NOT test for the novel  Coronavirus (2019 nCoV)    Coronavirus HKU1 NOT DETECTED NOT DETECTED Final   Coronavirus NL63 NOT DETECTED NOT DETECTED Final   Coronavirus OC43 NOT DETECTED NOT DETECTED Final   Metapneumovirus NOT DETECTED NOT DETECTED Final   Rhinovirus / Enterovirus NOT DETECTED NOT DETECTED Final   Influenza A NOT DETECTED NOT DETECTED Final   Influenza B NOT DETECTED NOT DETECTED Final   Parainfluenza Virus 1 NOT DETECTED NOT DETECTED Final   Parainfluenza Virus 2 NOT DETECTED NOT DETECTED Final   Parainfluenza Virus 3 NOT DETECTED NOT DETECTED Final   Parainfluenza Virus 4 NOT DETECTED NOT DETECTED Final   Respiratory Syncytial Virus NOT DETECTED NOT DETECTED Final   Bordetella pertussis NOT DETECTED NOT DETECTED Final   Bordetella Parapertussis NOT DETECTED NOT DETECTED Final   Chlamydophila pneumoniae NOT DETECTED NOT DETECTED Final   Mycoplasma pneumoniae NOT DETECTED NOT DETECTED Final    Comment: Performed at [facility], [address], [place], [place] [address]  Culture, blood (Routine X 2) w Reflex to ID Panel     Status: None   Collection Time: 08/15/23 10:22 AM   Specimen: BLOOD LEFT ARM  Result Value Ref Range Status   Specimen Description BLOOD LEFT ARM  Final   Special Requests   Final    BOTTLES DRAWN AEROBIC AND ANAEROBIC Blood Culture adequate volume   Culture   Final    NO GROWTH 5 DAYS Performed at [facility], [address] [place], [place] [address]    Report  Status 08/20/2023 FINAL  Final  Culture, blood (Routine X 2) w Reflex to ID Panel     Status: None   Collection Time: 08/15/23 10:40 AM   Specimen: BLOOD LEFT ARM  Result Value Ref Range Status   Specimen Description BLOOD LEFT ARM  Final   Special Requests   Final    BOTTLES DRAWN AEROBIC AND ANAEROBIC Blood Culture adequate volume   Culture   Final    NO GROWTH 5 DAYS Performed at [facility], [address] [place], [place] [address]    Report Status 08/20/2023 FINAL  Final    Labs: CBC: Recent Labs  Lab 08/15/23 0345 08/16/23 0347 08/17/23 0338 08/18/23 0334 08/19/23 0758  WBC 12.6* 18.6* 12.2* 13.9* 12.8*  HGB 16.5 15.8 16.0 17.2* 17.0  HCT 51.7 51.2 51.5 54.9* 55.4*  MCV 92.2 96.2 95.4 94.7 96.5  PLT 162 165 138* 165 139*   Basic Metabolic Panel: Recent Labs  Lab 08/15/23 0345 08/16/23 0347 08/17/23 0338 08/18/23 0334 08/19/23 0758 08/21/23 0340  NA 139 137 137 140 140 141  K 3.9 3.8 4.9 4.8 4.4 3.9  CL 85* 82* 80* 82* 80* 80*  CO2 39* 40* 43* 44* 45* >45*  GLUCOSE 214* 171* 103* 136* 109* 91  BUN 134* 120* 101* 83* 72* 62*  CREATININE 2.20* 1.78* 1.63* 1.40* 1.51* 1.43*  CALCIUM  9.1 9.3 9.5 9.9 9.8 9.1  MG 2.3 2.2  --   --   --   --   PHOS 1.9* 4.0  --   --   --   --    Liver Function Tests: No results for input(s): "AST", "ALT", "ALKPHOS", "BILITOT", "PROT", "ALBUMIN" in the last 168 hours. CBG: Recent Labs  Lab 08/20/23 0843 08/20/23 1139 08/20/23 1547 08/20/23 2052 08/21/23 0558  GLUCAP 152* 129* 220* 137* 77    Discharge time spent: greater than 30 minutes.  Signed: Danette Duos, MD [facility] 08/21/2023

## 2023-09-19 ENCOUNTER — Emergency Department (HOSPITAL_COMMUNITY)

## 2023-09-19 ENCOUNTER — Emergency Department (HOSPITAL_COMMUNITY)
Admission: EM | Admit: 2023-09-19 | Discharge: 2023-09-19 | Disposition: A | Discharge status: Home / Self Care | Attending: Emergency Medicine | Admitting: Emergency Medicine

## 2023-09-19 ENCOUNTER — Other Ambulatory Visit: Payer: Self-pay

## 2023-09-19 ENCOUNTER — Encounter (HOSPITAL_COMMUNITY): Payer: Self-pay | Admitting: *Deleted

## 2023-09-19 DIAGNOSIS — Z7951 Long term (current) use of inhaled steroids: Secondary | ICD-10-CM | POA: Diagnosis not present

## 2023-09-19 DIAGNOSIS — Z7901 Long term (current) use of anticoagulants: Secondary | ICD-10-CM | POA: Insufficient documentation

## 2023-09-19 DIAGNOSIS — J449 Chronic obstructive pulmonary disease, unspecified: Secondary | ICD-10-CM | POA: Diagnosis not present

## 2023-09-19 DIAGNOSIS — I2510 Atherosclerotic heart disease of native coronary artery without angina pectoris: Secondary | ICD-10-CM | POA: Insufficient documentation

## 2023-09-19 DIAGNOSIS — I509 Heart failure, unspecified: Secondary | ICD-10-CM | POA: Insufficient documentation

## 2023-09-19 DIAGNOSIS — R739 Hyperglycemia, unspecified: Secondary | ICD-10-CM

## 2023-09-19 DIAGNOSIS — R0689 Other abnormalities of breathing: Secondary | ICD-10-CM

## 2023-09-19 DIAGNOSIS — R000 Tachycardia, unspecified: Secondary | ICD-10-CM | POA: Insufficient documentation

## 2023-09-19 DIAGNOSIS — E1165 Type 2 diabetes mellitus with hyperglycemia: Secondary | ICD-10-CM | POA: Diagnosis present

## 2023-09-19 DIAGNOSIS — R7981 Abnormal blood-gas level: Secondary | ICD-10-CM | POA: Insufficient documentation

## 2023-09-19 DIAGNOSIS — Z794 Long term (current) use of insulin: Secondary | ICD-10-CM | POA: Insufficient documentation

## 2023-09-19 LAB — CBC WITH DIFFERENTIAL/PLATELET
Abs Immature Granulocytes: 0.31 10*3/uL — ABNORMAL HIGH (ref 0.00–0.07)
Basophils Absolute: 0 10*3/uL (ref 0.0–0.1)
Basophils Relative: 1 %
Eosinophils Absolute: 0 10*3/uL (ref 0.0–0.5)
Eosinophils Relative: 0 %
HCT: 40.1 % (ref 39.0–52.0)
Hemoglobin: 12.4 g/dL — ABNORMAL LOW (ref 13.0–17.0)
Immature Granulocytes: 5 %
Lymphocytes Relative: 11 %
Lymphs Abs: 0.7 10*3/uL (ref 0.7–4.0)
MCH: 30.4 pg (ref 26.0–34.0)
MCHC: 30.9 g/dL (ref 30.0–36.0)
MCV: 98.3 fL (ref 80.0–100.0)
Monocytes Absolute: 0.1 10*3/uL (ref 0.1–1.0)
Monocytes Relative: 2 %
Neutro Abs: 4.8 10*3/uL (ref 1.7–7.7)
Neutrophils Relative %: 81 %
Platelets: 134 10*3/uL — ABNORMAL LOW (ref 150–400)
RBC: 4.08 MIL/uL — ABNORMAL LOW (ref 4.22–5.81)
RDW: 16.1 % — ABNORMAL HIGH (ref 11.5–15.5)
WBC: 6 10*3/uL (ref 4.0–10.5)
nRBC: 0.3 % — ABNORMAL HIGH (ref 0.0–0.2)

## 2023-09-19 LAB — URINALYSIS, ROUTINE W REFLEX MICROSCOPIC
Bacteria, UA: NONE SEEN
Bilirubin Urine: NEGATIVE
Glucose, UA: >=500 mg/dL — AB
Hgb urine dipstick: NEGATIVE
Ketones, ur: NEGATIVE mg/dL
Leukocytes,Ua: NEGATIVE
Nitrite: NEGATIVE
Protein, ur: NEGATIVE mg/dL
Specific Gravity, Urine: 1.007 (ref 1.005–1.030)
pH: 6 (ref 5.0–8.0)

## 2023-09-19 LAB — COMPREHENSIVE METABOLIC PANEL WITH GFR
ALT: 18 U/L (ref 0–44)
AST: 18 U/L (ref 15–41)
Albumin: 3.2 g/dL — ABNORMAL LOW (ref 3.5–5.0)
Alkaline Phosphatase: 94 U/L (ref 38–126)
BUN: 60 mg/dL — ABNORMAL HIGH (ref 8–23)
CO2: >45 mmol/L — ABNORMAL HIGH (ref 22–32)
Calcium: 9.1 mg/dL (ref 8.9–10.3)
Chloride: 72 mmol/L — ABNORMAL LOW (ref 98–111)
Creatinine, Ser: 1.44 mg/dL — ABNORMAL HIGH (ref 0.61–1.24)
GFR, Estimated: 51 mL/min — ABNORMAL LOW (ref >60)
Glucose, Bld: 301 mg/dL — ABNORMAL HIGH (ref 70–99)
Potassium: 4.2 mmol/L (ref 3.5–5.1)
Sodium: 135 mmol/L (ref 135–145)
Total Bilirubin: 1.1 mg/dL (ref 0.0–1.2)
Total Protein: 6.3 g/dL — ABNORMAL LOW (ref 6.5–8.1)

## 2023-09-19 LAB — BLOOD GAS, VENOUS
Acid-Base Excess: 35.4 mmol/L — ABNORMAL HIGH (ref 0.0–2.0)
Bicarbonate: 69.4 mmol/L — ABNORMAL HIGH (ref 20.0–28.0)
Drawn by: 66297
O2 Saturation: 57.5 %
Patient temperature: 35.6
pCO2, Ven: 113 mmHg (ref 44–60)
pH, Ven: 7.39 (ref 7.25–7.43)
pO2, Ven: 32 mmHg (ref 32–45)

## 2023-09-19 LAB — CBG MONITORING, ED
Glucose-Capillary: 419 mg/dL — ABNORMAL HIGH (ref 70–99)
Glucose-Capillary: 217 mg/dL — ABNORMAL HIGH (ref 70–99)

## 2023-09-19 LAB — BETA-HYDROXYBUTYRIC ACID: Beta-Hydroxybutyric Acid: 0.05 mmol/L (ref 0.05–0.27)

## 2023-09-19 LAB — TROPONIN I (HIGH SENSITIVITY): Troponin I (High Sensitivity): 49 ng/L — ABNORMAL HIGH (ref <18)

## 2023-09-19 MED ORDER — SYMLINPEN 60 1500 MCG/1.5ML ~~LOC~~ SOPN: 6.0000 [IU] | PEN_INJECTOR | Freq: Every day | SUBCUTANEOUS | 0 refills | Status: AC

## 2023-09-19 MED ORDER — SODIUM CHLORIDE 0.9 % IV BOLUS
1000.0000 mL | Freq: Once | INTRAVENOUS | Status: AC
  Administered 2023-09-19: 1000 mL via INTRAVENOUS

## 2023-09-19 MED ORDER — INSULIN ASPART 100 UNIT/ML IJ SOLN
8.0000 [IU] | Freq: Once | INTRAMUSCULAR | Status: AC
  Administered 2023-09-19: 8 [IU] via INTRAVENOUS
  Filled 2023-09-19: qty 1

## 2023-09-19 MED ORDER — IPRATROPIUM-ALBUTEROL 0.5-2.5 (3) MG/3ML IN SOLN
3.0000 mL | Freq: Once | RESPIRATORY_TRACT | Status: AC
  Administered 2023-09-19: 3 mL via RESPIRATORY_TRACT
  Filled 2023-09-19: qty 3

## 2023-09-19 MED ORDER — INSULIN ASPART PROT & ASPART (70-30 MIX) 100 UNIT/ML ~~LOC~~ SUSP: SUBCUTANEOUS | 0 refills | Status: DC

## 2023-09-19 NOTE — ED Triage Notes (Signed)
 Pt arrived via [facility] from home with c/o Hyperglycemia, cbg in the 400s,  and AMS

## 2023-09-19 NOTE — ED Triage Notes (Signed)
 Pt arrived via [facility] c/o hyperglycemia from home, cbg in the 400s per EMS. Pt also presents with AMS

## 2023-09-19 NOTE — Discharge Instructions (Addendum)
 Make sure you are using your BIPAP machine at home when you sleep.  Check your blood sugar before meals and give yourself the sliding scale dose.  Keep track of how much insulin  you are giving yourself and take this with you to your next doctor visit.

## 2023-09-19 NOTE — ED Provider Notes (Signed)
 [place] EMERGENCY DEPARTMENT AT [facility] Provider Note   CSN: [id number] Arrival date & time: 09/19/23  1536     History  Chief Complaint  Patient presents with   Hyperglycemia    Brian Parsons is a 73 y.o. male.  Pt is a 73 yo male with pmhx significant for CODP (requiring 4L oxygen ), polycythemia treated with phlebotomy, CHF, CAD, DM2 (no current meds), and hx DVT and aflutter (on Eliquis ).  Pt was last admitted from 4/28-5/6 for COPD exacerbation.  Upon hospital d/c note, it said he's supposed to be on Symlin 6 units daily, NovoLog  3 times a day with meals for his DM.  However, these meds are not on his d/c med list.  Pt's family called EMS for sob, but when EMS arrived, his tubing was kinked.  They put him on his usual 4L and his breathing has improved.  He felt back to normal and wanted to stay at home.  However, they checked his BS and it was elevated, so they told him to come in.  He is very thirsty.  EMS did instruct family on how to correct tubing issues.         Home Medications Prior to Admission medications   Medication Sig Start Date End Date Taking? Authorizing Provider  insulin  aspart protamine- aspart (NOVOLOG  MIX 70/30) (70-30) 100 UNIT/ML injection For a blood sugar of 201-250, give yourself 2 units; 251-300, give 4 units; 301-350, give 6 units; 351-400, give 8 units; >401, give 10 units. 09/19/23  Yes Sueellen Emery, MD  Pramlintide Acetate (SYMLINPEN 60) 1500 MCG/1.5ML SOPN Inject 6 Units into the skin daily. 09/19/23 10/19/23 Yes Sueellen Emery, MD  albuterol  (ACCUNEB ) 0.63 MG/3ML nebulizer solution Take 3 mLs (0.63 mg total) by nebulization every 4 (four) hours as needed. 10/14/19   Diamond Formica, MD  albuterol  (VENTOLIN  HFA) 108 (90 Base) MCG/ACT inhaler Inhale 2 puffs into the lungs every 6 (six) hours as needed for wheezing or shortness of breath. 05/17/22   Diamond Formica, MD  allopurinol  (ZYLOPRIM ) 100 MG tablet Take 200 mg by mouth 2 (two) times  daily.  08/06/17   [provider]  apixaban  (ELIQUIS ) 5 MG TABS tablet Take 1 tablet (5 mg total) by mouth 2 (two) times daily. 08/11/23   Rayfield Cairo, MD  atorvastatin  (LIPITOR) 20 MG tablet Take 20 mg by mouth daily at 6 PM.  03/01/17 08/08/23  [provider]  azelastine  (ASTELIN ) 0.1 % nasal spray Place 1 spray into both nostrils 2 (two) times daily. 07/19/20   [provider]  bisoprolol  (ZEBETA ) 5 MG tablet Take 0.5 tablets (2.5 mg total) by mouth daily. 08/12/23   Rayfield Cairo, MD  Blood Glucose Monitoring Suppl (TRUE METRIX METER) DEVI See admin instructions. 07/07/19   [provider]  busPIRone  (BUSPAR ) 7.5 MG tablet Take 7.5 mg by mouth 2 (two) times daily. 04/25/23   [provider]  colchicine  0.6 MG tablet Take 0.6 mg by mouth as needed (gout). 06/25/23   [provider]  docusate sodium  (COLACE) 100 MG capsule Take 1 capsule (100 mg total) by mouth 2 (two) times daily. 08/21/23   Regalado, Belkys A, MD  fluticasone  (FLONASE ) 50 MCG/ACT nasal spray Place 1 spray into both nostrils daily. 07/19/20   [provider]  Fluticasone -Umeclidin-Vilant (TRELEGY ELLIPTA ) 100-62.5-25 MCG/ACT AEPB One click each am 03/11/21   Diamond Formica, MD  guaiFENesin  (MUCINEX ) 600 MG 12 hr tablet Take 2 tablets (1,200  mg total) by mouth 2 (two) times daily. 08/21/23   Regalado, Belkys A, MD  ipratropium-albuterol  (DUONEB) 0.5-2.5 (3) MG/3ML SOLN Take 3 mLs by nebulization every 4 (four) hours as needed. 04/18/21   [provider]  LORazepam  (ATIVAN ) 0.5 MG tablet Take 0.5 mg by mouth daily as needed. 08/29/23   [provider]  magnesium  gluconate (MAGONATE) 500 MG tablet Take 500 mg by mouth 2 (two) times daily. 08/29/23   [provider]  metolazone  (ZAROXOLYN ) 2.5 MG tablet Take 2.5 mg by mouth every other day.  10/01/18   [provider]  Multiple Vitamin (MULTIVITAMIN WITH MINERALS) TABS tablet Take 1 tablet  by mouth daily. 08/22/23   Regalado, Belkys A, MD  nicotine  (NICODERM CQ  - DOSED IN MG/24 HOURS) 14 mg/24hr patch Place 1 patch (14 mg total) onto the skin daily. 08/22/23   Regalado, Belkys A, MD  pantoprazole  (PROTONIX ) 40 MG tablet Take 1 tablet (40 mg total) by mouth daily for 14 days. 08/12/23 08/26/23  Johnson, Clanford L, MD  polyethylene glycol powder (GLYCOLAX /MIRALAX ) 17 GM/SCOOP powder Take 17 g by mouth in the morning and at bedtime. 1 capful 08/29/23   [provider]  potassium chloride  SA (KLOR-CON  M) 20 MEQ tablet Take 20 mEq by mouth daily. 02/21/23   [provider]  senna (SENOKOT) 8.6 MG TABS tablet Take 2 tablets (17.2 mg total) by mouth 2 (two) times daily. 08/21/23   Regalado, Belkys A, MD  sildenafil (VIAGRA) 50 MG tablet Take 50 mg by mouth as needed for erectile dysfunction. 04/18/21   [provider]  torsemide  (DEMADEX ) 20 MG tablet Take 80 mg by mouth 2 (two) times daily.     [provider]  vitamin B-12 (CYANOCOBALAMIN ) 1000 MCG tablet Take 1,000 mcg by mouth daily.    [provider]  ZYRTEC ALLERGY 10 MG tablet Take 10 mg by mouth daily. 09/27/22   [provider]      Allergies    Citalopram, Bupropion, Escitalopram , Penicillins, Trazodone hcl, Varenicline tartrate, Amoxicillin, and Penicillin g    Review of Systems   Review of Systems  Endocrine: Positive for polydipsia and polyuria.  All other systems reviewed and are negative.   Physical Exam Updated Vital Signs BP 139/85   Pulse (!) 102   Temp (!) 97.5 F (36.4 C) (Oral)   Resp (!) 22   SpO2 97%  Physical Exam Vitals and nursing note reviewed.  Constitutional:      Appearance: Normal appearance.  HENT:     Head: Normocephalic and atraumatic.     Right Ear: External ear normal.     Left Ear: External ear normal.     Nose: Nose normal.     Mouth/Throat:     Mouth: Mucous membranes are dry.     Pharynx: Oropharynx is clear.  Eyes:     Extraocular  Movements: Extraocular movements intact.     Conjunctiva/sclera: Conjunctivae normal.     Pupils: Pupils are equal, round, and reactive to light.  Cardiovascular:     Rate and Rhythm: Tachycardia present. Rhythm irregular.     Pulses: Normal pulses.     Heart sounds: Normal heart sounds.  Pulmonary:     Effort: Pulmonary effort is normal.     Breath sounds: Wheezing present.  Abdominal:     General: Abdomen is flat. Bowel sounds are normal.     Palpations: Abdomen is soft.  Musculoskeletal:        General: Normal  range of motion.     Cervical back: Normal range of motion and neck supple.  Skin:    General: Skin is warm.     Capillary Refill: Capillary refill takes less than 2 seconds.  Neurological:     General: No focal deficit present.     Mental Status: He is alert and oriented to person, place, and time.  Psychiatric:        Mood and Affect: Mood normal.        Behavior: Behavior normal.     ED Results / Procedures / Treatments   Labs (all labs ordered are listed, but only abnormal results are displayed) Labs Reviewed  COMPREHENSIVE METABOLIC PANEL WITH GFR - Abnormal; Notable for the following components:      Result Value   Chloride 72 (*)    CO2 >45 (*)    Glucose, Bld 301 (*)    BUN 60 (*)    Creatinine, Ser 1.44 (*)    Total Protein 6.3 (*)    Albumin 3.2 (*)    GFR, Estimated 51 (*)    All other components within normal limits  CBC WITH DIFFERENTIAL/PLATELET - Abnormal; Notable for the following components:   RBC 4.08 (*)    Hemoglobin 12.4 (*)    RDW 16.1 (*)    Platelets 134 (*)    nRBC 0.3 (*)    Abs Immature Granulocytes 0.31 (*)    All other components within normal limits  URINALYSIS, ROUTINE W REFLEX MICROSCOPIC - Abnormal; Notable for the following components:   Color, Urine STRAW (*)    Glucose, UA >=500 (*)    All other components within normal limits  BLOOD GAS, VENOUS - Abnormal; Notable for the following components:   pCO2, Ven 113 (*)     Bicarbonate 69.4 (*)    Acid-Base Excess 35.4 (*)    All other components within normal limits  CBG MONITORING, ED - Abnormal; Notable for the following components:   Glucose-Capillary 419 (*)    All other components within normal limits  CBG MONITORING, ED - Abnormal; Notable for the following components:   Glucose-Capillary 217 (*)    All other components within normal limits  TROPONIN I (HIGH SENSITIVITY) - Abnormal; Notable for the following components:   Troponin I (High Sensitivity) 49 (*)    All other components within normal limits  BETA-HYDROXYBUTYRIC ACID  TROPONIN I (HIGH SENSITIVITY)    EKG EKG Interpretation Date/Time:  Wednesday September 19 2023 16:21:15 EDT Ventricular Rate:  119 PR Interval:    QRS Duration:  136 QT Interval:  347 QTC Calculation: 489 R Axis:   -70  Text Interpretation: Atrial flutter IVCD, consider atypical RBBB LVH with IVCD and secondary repol abnrm Borderline prolonged QT interval Since last tracing rate faster Confirmed by Sueellen Emery [phone]) on 09/19/2023 4:31:48 PM  Radiology DG Chest Portable 1 View Result Date: 09/19/2023 CLINICAL DATA:  Shortness of breath. EXAM: PORTABLE CHEST 1 VIEW COMPARISON:  08/16/2023 and CT chest 03/20/2022. FINDINGS: Trachea is midline. Heart is enlarged. Mild central pulmonary vascular congestion. Airspace opacification in the right middle lobe. Probable old right rib fractures. Old right clavicle fracture. There may be resection of the lateral aspect of the distal left clavicle. IMPRESSION: Probable right basilar atelectasis.  Difficult to exclude pneumonia. Electronically Signed   By: Shearon Denis M.D.   On: 09/19/2023 16:25    Procedures Procedures    Medications Ordered in ED Medications  sodium chloride  0.9 % bolus 1,000  mL (0 mLs Intravenous Stopped 09/19/23 1750)  insulin  aspart (novoLOG ) injection 8 Units (8 Units Intravenous Given 09/19/23 1636)    ED Course/ Medical Decision Making/ A&P                                  Medical Decision Making Amount and/or Complexity of Data Reviewed Labs: ordered. Radiology: ordered.  Risk Prescription drug management.   This patient presents to the ED for concern of hyperglycemia, this involves an extensive number of treatment options, and is a complaint that carries with it a high risk of complications and morbidity.  The differential diagnosis includes hhs, dka, hyperglycemia   Co morbidities that complicate the patient evaluation  CODP (requiring 4L oxygen ), polycythemia treated with phlebotomy, CHF, CAD, DM2 (no current meds), and hx DVT and aflutter (on Eliquis )   Additional history obtained:  Additional history obtained from epic chart review External records from outside source obtained and reviewed including EMS report   Lab Tests:  I Ordered, and personally interpreted labs.  The pertinent results include:  VBG with pCO2 elevated at 113, but pH 7.39; cbc with hgb 12.4; ua + glucose   Imaging Studies ordered:  I ordered imaging studies including cxr  I independently visualized and interpreted imaging which showed Probable right basilar atelectasis.  Difficult to exclude pneumonia.  I agree with the radiologist interpretation   Cardiac Monitoring:  The patient was maintained on a cardiac monitor.  I personally viewed and interpreted the cardiac monitored which showed an underlying rhythm of: afib   Medicines ordered and prescription drug management:  I ordered medication including ivfs/insulin   for sx  Reevaluation of the patient after these medicines showed that the patient improved I have reviewed the patients home medicines and have made adjustments as needed   Critical Interventions:  Iv insulin    Problem List / ED Course:  Hyperglycemia:  pt's bs is down to 217 with insulin  and fluids.  He was not prescribed the insulin  he was using in the hospital.  I have rx'd him the insulin  that was recommended at  d/c.  He is to keep track of his sliding scale doses and f/u with pcp. COPD and hypercarbia:  Chronic.  Pt is awake and alert.  His pH is normal, so he's compensated.  Pt is to make sure he is using his bipap while sleeping. Elevated trop:   no cp.  chronic.  Pt does have outpatient cards f/u.   Reevaluation:  After the interventions noted above, I reevaluated the patient and found that they have :improved   Social Determinants of Health:  Lives at home   Dispostion:  After consideration of the diagnostic results and the patients response to treatment, I feel that the patent would benefit from discharge with outpatient f/u.          Final Clinical Impression(s) / ED Diagnoses Final diagnoses:  Hyperglycemia  Hypercarbia    Rx / DC Orders ED Discharge Orders          Ordered    Pramlintide Acetate (SYMLINPEN 60) 1500 MCG/1.5ML SOPN  Daily        09/19/23 1815    insulin  aspart protamine- aspart (NOVOLOG  MIX 70/30) (70-30) 100 UNIT/ML injection        09/19/23 1815              Sueellen Emery, MD 09/19/23 1818

## 2023-09-19 NOTE — ED Notes (Signed)
 Respiratory called for duoneb

## 2023-09-24 ENCOUNTER — Ambulatory Visit: Payer: Medicare HMO | Admitting: Cardiology

## 2023-09-24 ENCOUNTER — Telehealth: Payer: Self-pay | Admitting: Cardiology

## 2023-09-24 NOTE — Telephone Encounter (Signed)
 Caller Moira Andrews) reports patient passed away on 10-12-2023.

## 2023-10-16 DEATH — deceased
